# Patient Record
Sex: Female | Born: 1965 | Race: White | Hispanic: No | Marital: Married | Smoking: Former smoker
Health system: Southern US, Community
[De-identification: ages and names within clinical notes are randomized; demographics above are authoritative.]

## PROBLEM LIST (undated history)

## (undated) ENCOUNTER — Inpatient Hospital Stay: Admission: EM | Payer: Self-pay | Source: Home / Self Care

## (undated) DIAGNOSIS — J449 Chronic obstructive pulmonary disease, unspecified: Secondary | ICD-10-CM

## (undated) DIAGNOSIS — D649 Anemia, unspecified: Secondary | ICD-10-CM

## (undated) DIAGNOSIS — E785 Hyperlipidemia, unspecified: Secondary | ICD-10-CM

## (undated) DIAGNOSIS — C801 Malignant (primary) neoplasm, unspecified: Secondary | ICD-10-CM

## (undated) DIAGNOSIS — J45909 Unspecified asthma, uncomplicated: Secondary | ICD-10-CM

## (undated) DIAGNOSIS — R519 Headache, unspecified: Secondary | ICD-10-CM

## (undated) DIAGNOSIS — C539 Malignant neoplasm of cervix uteri, unspecified: Secondary | ICD-10-CM

## (undated) DIAGNOSIS — J40 Bronchitis, not specified as acute or chronic: Secondary | ICD-10-CM

## (undated) DIAGNOSIS — C4491 Basal cell carcinoma of skin, unspecified: Secondary | ICD-10-CM

## (undated) HISTORY — PX: APPENDECTOMY: SHX54

## (undated) HISTORY — PX: BREAST BIOPSY: SHX20

## (undated) HISTORY — PX: FOOT SURGERY: SHX648

## (undated) HISTORY — PX: TUBAL LIGATION: SHX77

## (undated) HISTORY — DX: Hyperlipidemia, unspecified: E78.5

## (undated) HISTORY — PX: MELANOMA EXCISION: SHX5266

## (undated) HISTORY — DX: Basal cell carcinoma of skin, unspecified: C44.91

## (undated) HISTORY — DX: Malignant neoplasm of cervix uteri, unspecified: C53.9

## (undated) HISTORY — PX: PARTIAL HYSTERECTOMY: SHX80

---

## 2015-11-03 DIAGNOSIS — F17200 Nicotine dependence, unspecified, uncomplicated: Secondary | ICD-10-CM | POA: Insufficient documentation

## 2015-11-03 DIAGNOSIS — F411 Generalized anxiety disorder: Secondary | ICD-10-CM | POA: Insufficient documentation

## 2015-11-03 DIAGNOSIS — F339 Major depressive disorder, recurrent, unspecified: Secondary | ICD-10-CM | POA: Insufficient documentation

## 2015-11-03 DIAGNOSIS — G43909 Migraine, unspecified, not intractable, without status migrainosus: Secondary | ICD-10-CM | POA: Insufficient documentation

## 2015-11-03 DIAGNOSIS — N951 Menopausal and female climacteric states: Secondary | ICD-10-CM | POA: Insufficient documentation

## 2015-11-03 DIAGNOSIS — M1990 Unspecified osteoarthritis, unspecified site: Secondary | ICD-10-CM | POA: Insufficient documentation

## 2015-11-05 DIAGNOSIS — Z79899 Other long term (current) drug therapy: Secondary | ICD-10-CM | POA: Insufficient documentation

## 2021-06-21 HISTORY — PX: FOOT SURGERY: SHX648

## 2021-08-02 ENCOUNTER — Ambulatory Visit: Payer: BC Managed Care – PPO | Admitting: Emergency Medicine

## 2021-08-02 ENCOUNTER — Encounter: Payer: Self-pay | Admitting: Emergency Medicine

## 2021-08-02 ENCOUNTER — Other Ambulatory Visit: Payer: Self-pay

## 2021-08-02 VITALS — BP 132/78 | HR 77 | Temp 98.4°F | Ht 60.0 in | Wt 112.0 lb

## 2021-08-02 DIAGNOSIS — R918 Other nonspecific abnormal finding of lung field: Secondary | ICD-10-CM | POA: Diagnosis not present

## 2021-08-02 DIAGNOSIS — J449 Chronic obstructive pulmonary disease, unspecified: Secondary | ICD-10-CM | POA: Diagnosis not present

## 2021-08-02 DIAGNOSIS — J441 Chronic obstructive pulmonary disease with (acute) exacerbation: Secondary | ICD-10-CM | POA: Insufficient documentation

## 2021-08-02 NOTE — Assessment & Plan Note (Signed)
Would benefit from PFT to quantify degree of obstruction, probably addition of a LAMA going forward.  She is currently on Advair and we will continue this for now. ?

## 2021-08-02 NOTE — Progress Notes (Signed)
? ?Subjective:  ? ? Patient ID: April Walsh, female    DOB: 1966-01-24, 56 y.o.   MRN: [id number] ? ?HPI ?56 year old smoker, 32 pack years, with a history of COPD/asthma, cervical cancer, basal cell skin cancer, migraines, depression. ?She carries a history of COPD/asthma and was having flaring symptoms, cough, bronchitic complaints, prompted chest imaging.  Her Symbicort was changed to Advair HFA.  She is here to discuss abnormal chest x-ray and CT chest. ? ?CT chest 07/20/2021 done at [facility] and reviewed by me shows a 3.1 x 2.2 cm right hilar mass with associated near complete opacification of the right upper lobe bronchus, 2 cm subcarinal node, 9 x 8 mm posterior right upper lobe nodular density.  Also noted were large bilateral adrenal masses concerning for metastatic disease.  There is a 1.5 cm rounded right breast density as well.  ? ? ?Review of Systems ?As per HPI ? ?PMH: ?COPD/asthma ?Cervical cancer ?Basal cell skin cancer ?Migraines ?Depression ? ?Surgical history: ?Appendectomy ?Cesarean x2 ?Partial hysterectomy tubal ligation ?Breast biopsy ? ?No family history on file.  ? ?Social History  ? ?Socioeconomic History  ? Marital status: Married  ?  Spouse name: Not on file  ? Number of children: Not on file  ? Years of education: Not on file  ? Highest education level: Not on file  ?Occupational History  ? Not on file  ?Tobacco Use  ? Smoking status: Every Day  ?  Packs/day: 1.00  ?  Years: 32.00  ?  Pack years: 32.00  ?  Types: Cigarettes  ?  Passive exposure: Past  ? Smokeless tobacco: Never  ? Tobacco comments:  ?  1/2 pack smoked daily ARJ, RN 08/02/21  ?Substance and Sexual Activity  ? Alcohol use: Not on file  ? Drug use: Not on file  ? Sexual activity: Not on file  ?Other Topics Concern  ? Not on file  ?Social History Narrative  ? Not on file  ? ?Social Determinants of Health  ? ?Financial Resource Strain: Not on file  ?Food Insecurity: Not on file  ?Transportation Needs: Not on file   ?Physical Activity: Not on file  ?Stress: Not on file  ?Social Connections: Not on file  ?Intimate Partner Violence: Not on file  ?  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  ? ?Not on File  ? ?Outpatient Medications Prior to Visit  ?Medication Sig Dispense Refill  ? albuterol (VENTOLIN HFA) 108 (90 Base) MCG/ACT inhaler Inhale into the lungs.    ? ?No facility-administered medications prior to visit.  ? ? ? ?   ?Objective:  ? Physical Exam ?Vitals:  ? 08/02/21 0941  ?BP: 132/78  ?Pulse: 77  ?Temp: 98.4 ?F (36.9 ?C)  ?TempSrc: Oral  ?SpO2: 98%  ?Weight: 112 lb (50.8 kg)  ?Height: 5' (1.524 m)  ? ?Gen: Pleasant, well-nourished, in no distress,  normal affect ? ?ENT: No lesions,  mouth clear,  oropharynx clear, no postnasal drip ? ?Neck: No JVD, no stridor ? ?Lungs: No use of accessory muscles, no crackles or wheezing on normal respiration, no wheeze on forced expiration ? ?Cardiovascular: RRR,  syst M w intact S2, no peripheral edema ? ?Musculoskeletal: No deformities, no cyanosis or clubbing ? ?Neuro: alert, awake, non focal ? ?Skin: Warm, no lesions or rash ? ? ?   ?Assessment & Plan:  ?Mass of upper lobe of right lung ?Newly discovered right hilar mass, satellite lesion in the right  upper lobe.  Also with bilateral adrenal lesions, suspect metastatic disease.  Considered possibly consulting interventional radiology for adrenal biopsies which would potentially clarify overall picture if evidence for metastatic lung cancer.  I think most straightforward approach is bronchoscopy.  She may ultimately require adrenal biopsies as well.  She has a breast lesion that I believe has been worked up in the past, deemed to be fibrocystic disease but may require repeat evaluation, ultrasound, biopsy. ? ?We will arrange for bronchoscopy to further evaluate your right lung.  This will be done as an outpatient under general anesthesia at [facility] endoscopy.  You will need a designated  driver.  Do not take aspirin or any nonsteroidal pain medication 2 days prior.  We will try to get this scheduled for 08/13/2021. ?We will need to work on decreasing your cigarettes.  Ultimately goal will be to stop altogether. ?Follow with Dr Lamonte Sakai in 1 month ? ?COPD (chronic obstructive pulmonary disease) ([place]) ?Would benefit from PFT to quantify degree of obstruction, probably addition of a LAMA going forward.  She is currently on Advair and we will continue this for now. ? ? ? ?Baltazar Apo, MD, PhD ?08/02/2021, 1:48 PM ?[facility] ?[phone] or if no answer before 7:00PM call [phone] ?For any issues after 7:00PM please call eLink [phone] ? ? ?

## 2021-08-02 NOTE — Patient Instructions (Signed)
We will arrange for bronchoscopy to further evaluate your right lung.  This will be done as an outpatient under general anesthesia at [facility] endoscopy.  You will need a designated driver.  Do not take aspirin or any nonsteroidal pain medication 2 days prior.  We will try to get this scheduled for 08/13/2021. ?Continue your Advair as you have been taking it ?Keep albuterol available to use 2 puffs when needed for shortness of breath, chest tightness, wheezing. ?We will need to work on decreasing your cigarettes.  Ultimately goal will be to stop altogether. ?Follow with Dr Lamonte Sakai in 1 month ? ?

## 2021-08-02 NOTE — H&P (View-Only) (Signed)
? ?Subjective:  ? ? Patient ID: April Walsh, female    DOB: 03/28/1966, 56 y.o.   MRN: [id number] ? ?HPI ?56 year old smoker, 32 pack years, with a history of COPD/asthma, cervical cancer, basal cell skin cancer, migraines, depression. ?She carries a history of COPD/asthma and was having flaring symptoms, cough, bronchitic complaints, prompted chest imaging.  Her Symbicort was changed to Advair HFA.  She is here to discuss abnormal chest x-ray and CT chest. ? ?CT chest 07/20/2021 done at [facility] and reviewed by me shows a 3.1 x 2.2 cm right hilar mass with associated near complete opacification of the right upper lobe bronchus, 2 cm subcarinal node, 9 x 8 mm posterior right upper lobe nodular density.  Also noted were large bilateral adrenal masses concerning for metastatic disease.  There is a 1.5 cm rounded right breast density as well.  ? ? ?Review of Systems ?As per HPI ? ?PMH: ?COPD/asthma ?Cervical cancer ?Basal cell skin cancer ?Migraines ?Depression ? ?Surgical history: ?Appendectomy ?Cesarean x2 ?Partial hysterectomy tubal ligation ?Breast biopsy ? ?No family history on file.  ? ?Social History  ? ?Socioeconomic History  ? Marital status: Married  ?  Spouse name: Not on file  ? Number of children: Not on file  ? Years of education: Not on file  ? Highest education level: Not on file  ?Occupational History  ? Not on file  ?Tobacco Use  ? Smoking status: Every Day  ?  Packs/day: 1.00  ?  Years: 32.00  ?  Pack years: 32.00  ?  Types: Cigarettes  ?  Passive exposure: Past  ? Smokeless tobacco: Never  ? Tobacco comments:  ?  1/2 pack smoked daily ARJ, RN 08/02/21  ?Substance and Sexual Activity  ? Alcohol use: Not on file  ? Drug use: Not on file  ? Sexual activity: Not on file  ?Other Topics Concern  ? Not on file  ?Social History Narrative  ? Not on file  ? ?Social Determinants of Health  ? ?Financial Resource Strain: Not on file  ?Food Insecurity: Not on file  ?Transportation Needs: Not on file   ?Physical Activity: Not on file  ?Stress: Not on file  ?Social Connections: Not on file  ?Intimate Partner Violence: Not on file  ?  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  ? ?Not on File  ? ?Outpatient Medications Prior to Visit  ?Medication Sig Dispense Refill  ? albuterol (VENTOLIN HFA) 108 (90 Base) MCG/ACT inhaler Inhale into the lungs.    ? ?No facility-administered medications prior to visit.  ? ? ? ?   ?Objective:  ? Physical Exam ?Vitals:  ? 08/02/21 0941  ?BP: 132/78  ?Pulse: 77  ?Temp: 98.4 ?F (36.9 ?C)  ?TempSrc: Oral  ?SpO2: 98%  ?Weight: 112 lb (50.8 kg)  ?Height: 5' (1.524 m)  ? ?Gen: Pleasant, well-nourished, in no distress,  normal affect ? ?ENT: No lesions,  mouth clear,  oropharynx clear, no postnasal drip ? ?Neck: No JVD, no stridor ? ?Lungs: No use of accessory muscles, no crackles or wheezing on normal respiration, no wheeze on forced expiration ? ?Cardiovascular: RRR,  syst M w intact S2, no peripheral edema ? ?Musculoskeletal: No deformities, no cyanosis or clubbing ? ?Neuro: alert, awake, non focal ? ?Skin: Warm, no lesions or rash ? ? ?   ?Assessment & Plan:  ?Mass of upper lobe of right lung ?Newly discovered right hilar mass, satellite lesion in the right  upper lobe.  Also with bilateral adrenal lesions, suspect metastatic disease.  Considered possibly consulting interventional radiology for adrenal biopsies which would potentially clarify overall picture if evidence for metastatic lung cancer.  I think most straightforward approach is bronchoscopy.  She may ultimately require adrenal biopsies as well.  She has a breast lesion that I believe has been worked up in the past, deemed to be fibrocystic disease but may require repeat evaluation, ultrasound, biopsy. ? ?We will arrange for bronchoscopy to further evaluate your right lung.  This will be done as an outpatient under general anesthesia at [facility] endoscopy.  You will need a designated  driver.  Do not take aspirin or any nonsteroidal pain medication 2 days prior.  We will try to get this scheduled for 08/13/2021. ?We will need to work on decreasing your cigarettes.  Ultimately goal will be to stop altogether. ?Follow with Dr Lamonte Sakai in 1 month ? ?COPD (chronic obstructive pulmonary disease) ([place]) ?Would benefit from PFT to quantify degree of obstruction, probably addition of a LAMA going forward.  She is currently on Advair and we will continue this for now. ? ? ? ?Baltazar Apo, MD, PhD ?08/02/2021, 1:48 PM ?[facility] ?[phone] or if no answer before 7:00PM call [phone] ?For any issues after 7:00PM please call eLink [phone] ? ? ?

## 2021-08-02 NOTE — Assessment & Plan Note (Addendum)
Newly discovered right hilar mass, satellite lesion in the right upper lobe.  Also with bilateral adrenal lesions, suspect metastatic disease.  Considered possibly consulting interventional radiology for adrenal biopsies which would potentially clarify overall picture if evidence for metastatic lung cancer.  I think most straightforward approach is bronchoscopy.  She may ultimately require adrenal biopsies as well.  She has a breast lesion that I believe has been worked up in the past, deemed to be fibrocystic disease but may require repeat evaluation, ultrasound, biopsy. ? ?We will arrange for bronchoscopy to further evaluate your right lung.  This will be done as an outpatient under general anesthesia at [facility] endoscopy.  You will need a designated driver.  Do not take aspirin or any nonsteroidal pain medication 2 days prior.  We will try to get this scheduled for 08/13/2021. ?We will need to work on decreasing your cigarettes.  Ultimately goal will be to stop altogether. ?Follow with Dr Lamonte Sakai in 1 month ?

## 2021-08-08 ENCOUNTER — Telehealth: Payer: Self-pay

## 2021-08-08 NOTE — Telephone Encounter (Signed)
CD with CT Chest (07/20/21) and CXR (07/13/21) received and placed in Dr. Agustina Caroli review folder  ?

## 2021-08-09 ENCOUNTER — Other Ambulatory Visit: Payer: Self-pay

## 2021-08-09 ENCOUNTER — Encounter (HOSPITAL_COMMUNITY): Payer: Self-pay | Admitting: Emergency Medicine

## 2021-08-09 NOTE — Pre-Procedure Instructions (Signed)
SDW CALL ? ?Patient was given pre-op instructions over the phone. The opportunity was given for the patient to ask questions. No further questions asked. Patient verbalized understanding of instructions given. ? ? ?PCP - [name], FNP ?Cardiologist - denies ? ?PPM/ICD - denies ?Chest x-ray - 07/13/21 in care everywhere ?EKG - denies ?Stress Test - denies ?ECHO - denies ?Cardiac Cath - denies ? ?Aspirin Instructions:not taking aspiring ? ? ?COVID TEST- pt states she is going for test 08/10/21 per surgeon ? ? ?Anesthesia review: no ? ?Patient denies shortness of breath, fever, cough and chest pain over the phone call ? ? ? ?Surgical Instructions ? ? ? Your procedure is scheduled on Monday, April 3 ? [facility]tr[facility]A" at 5:30 A.M., then check in with the Admitting office. ? Call this number if you have problems the morning of surgery: ? [phone] ? ? ? Remember: ? Do not eat or drink after midnight the night before your surgery ? ? ? Take these medicines the morning of surgery with A SIP OF WATER:  ? ?ADVAIR HFA 115-21 MCG/ACT inhaler ?Please bring all inhalers with you the day of surgery.  ? ? ?As of today, STOP taking any Aspirin (unless otherwise instructed by your surgeon) Aleve, Naproxen, Ibuprofen, Motrin, Advil, Goody's, BC's, all herbal medications, fish oil, and all vitamins. ? ?[facility] is not responsible for any belongings or valuables.  ? ?Do NOT Smoke (Tobacco/Vaping)  24 hours prior to your procedure ? ?Contacts, glasses, hearing aids, dentures or partials may not be worn into surgery, please bring cases for these belongings ?  ?Patients discharged the day of surgery will not be allowed to drive home, and someone needs to stay with them for 24 hours. ? ? ?SURGICAL WAITING ROOM VISITATION ?No visitors are allowed in pre-op area with patient.  ?Patients having surgery or a procedure in a hospital may have two support people in the waiting room. ?Children under the age of  11 must have an adult with them who is not the patient. ?They may stay in the waiting area during the procedure and may switch out with other visitors. If the patient needs to stay at the hospital during part of their recovery, the visitor guidelines for inpatient rooms apply. ? ?Please refer to the [facility] website for the visitor guidelines for Inpatients (after your surgery is over and you are in a regular room).  ? ? ? ?Special instructions:   ? ?Oral Hygiene is also important to reduce your risk of infection.  Remember - BRUSH YOUR TEETH THE MORNING OF SURGERY WITH YOUR REGULAR TOOTHPASTE ? ? ?Day of Surgery: ? ?Take a shower the day of or night before with antibacterial soap. ?Wear Clean/Comfortable clothing the morning of surgery ?Do not apply any deodorants/lotions.   ?Do not wear jewelry or makeup ?Do not wear lotions, powders, perfumes/colognes, or deodorant. ?Do not shave 48 hours prior to surgery.  Men may shave face and neck. ?Do not bring valuables to the hospital. ?Do not wear nail polish, gel polish, artificial nails, or any other type of covering on natural nails (fingers and toes) ?If you have artificial nails or gel coating that need to be removed by a nail salon, please have this removed prior to surgery. Artificial nails or gel coating may interfere with anesthesia's ability to adequately monitor your vital signs. ?Remember to brush your teeth WITH YOUR REGULAR TOOTHPASTE. ? ? ? ? ? ?

## 2021-08-10 ENCOUNTER — Other Ambulatory Visit: Payer: Self-pay | Admitting: Emergency Medicine

## 2021-08-10 LAB — SARS CORONAVIRUS 2 (TAT 6-24 HRS): SARS Coronavirus 2: NEGATIVE

## 2021-08-13 ENCOUNTER — Other Ambulatory Visit: Payer: Self-pay | Admitting: Emergency Medicine

## 2021-08-13 ENCOUNTER — Encounter (HOSPITAL_COMMUNITY)
Admission: RE | Disposition: A | Discharge status: Home / Self Care | Payer: Self-pay | Source: Ambulatory Visit | Attending: Emergency Medicine

## 2021-08-13 ENCOUNTER — Ambulatory Visit (HOSPITAL_COMMUNITY): Payer: BC Managed Care – PPO | Admitting: Certified Registered"

## 2021-08-13 ENCOUNTER — Other Ambulatory Visit: Payer: Self-pay

## 2021-08-13 ENCOUNTER — Ambulatory Visit (HOSPITAL_COMMUNITY): Payer: BC Managed Care – PPO

## 2021-08-13 ENCOUNTER — Encounter (HOSPITAL_COMMUNITY): Payer: Self-pay | Admitting: Emergency Medicine

## 2021-08-13 ENCOUNTER — Ambulatory Visit (HOSPITAL_COMMUNITY)
Admission: RE | Admit: 2021-08-13 | Discharge: 2021-08-13 | Disposition: A | Discharge status: Home / Self Care | Payer: BC Managed Care – PPO | Source: Ambulatory Visit | Attending: Emergency Medicine | Admitting: Emergency Medicine

## 2021-08-13 DIAGNOSIS — C3490 Malignant neoplasm of unspecified part of unspecified bronchus or lung: Secondary | ICD-10-CM

## 2021-08-13 DIAGNOSIS — Z85828 Personal history of other malignant neoplasm of skin: Secondary | ICD-10-CM | POA: Insufficient documentation

## 2021-08-13 DIAGNOSIS — F32A Depression, unspecified: Secondary | ICD-10-CM | POA: Diagnosis not present

## 2021-08-13 DIAGNOSIS — R846 Abnormal cytological findings in specimens from respiratory organs and thorax: Secondary | ICD-10-CM | POA: Insufficient documentation

## 2021-08-13 DIAGNOSIS — R918 Other nonspecific abnormal finding of lung field: Secondary | ICD-10-CM | POA: Diagnosis present

## 2021-08-13 DIAGNOSIS — J449 Chronic obstructive pulmonary disease, unspecified: Secondary | ICD-10-CM | POA: Insufficient documentation

## 2021-08-13 DIAGNOSIS — F17210 Nicotine dependence, cigarettes, uncomplicated: Secondary | ICD-10-CM | POA: Diagnosis not present

## 2021-08-13 DIAGNOSIS — Z8541 Personal history of malignant neoplasm of cervix uteri: Secondary | ICD-10-CM | POA: Diagnosis not present

## 2021-08-13 DIAGNOSIS — R590 Localized enlarged lymph nodes: Secondary | ICD-10-CM | POA: Insufficient documentation

## 2021-08-13 HISTORY — PX: HEMOSTASIS CONTROL: SHX6838

## 2021-08-13 HISTORY — DX: Chronic obstructive pulmonary disease, unspecified: J44.9

## 2021-08-13 HISTORY — DX: Anemia, unspecified: D64.9

## 2021-08-13 HISTORY — DX: Headache, unspecified: R51.9

## 2021-08-13 HISTORY — DX: Malignant (primary) neoplasm, unspecified: C80.1

## 2021-08-13 HISTORY — PX: VIDEO BRONCHOSCOPY: SHX5072

## 2021-08-13 HISTORY — PX: BRONCHIAL BIOPSY: SHX5109

## 2021-08-13 HISTORY — PX: BRONCHIAL BRUSHINGS: SHX5108

## 2021-08-13 HISTORY — DX: Unspecified asthma, uncomplicated: J45.909

## 2021-08-13 HISTORY — PX: VIDEO BRONCHOSCOPY WITH ENDOBRONCHIAL ULTRASOUND: SHX6177

## 2021-08-13 HISTORY — DX: Bronchitis, not specified as acute or chronic: J40

## 2021-08-13 HISTORY — DX: Malignant neoplasm of unspecified part of unspecified bronchus or lung: C34.90

## 2021-08-13 HISTORY — PX: BRONCHIAL NEEDLE ASPIRATION BIOPSY: SHX5106

## 2021-08-13 LAB — CBC
HCT: 32.1 % — ABNORMAL LOW (ref 36.0–46.0)
Hemoglobin: 10.4 g/dL — ABNORMAL LOW (ref 12.0–15.0)
MCH: 31.5 pg (ref 26.0–34.0)
MCHC: 32.4 g/dL (ref 30.0–36.0)
MCV: 97.3 fL (ref 80.0–100.0)
Platelets: 255 10*3/uL (ref 150–400)
RBC: 3.3 MIL/uL — ABNORMAL LOW (ref 3.87–5.11)
RDW: 15.6 % — ABNORMAL HIGH (ref 11.5–15.5)
WBC: 7.4 10*3/uL (ref 4.0–10.5)
nRBC: 0 % (ref 0.0–0.2)

## 2021-08-13 SURGERY — BRONCHOSCOPY, WITH EBUS
Anesthesia: General

## 2021-08-13 MED ORDER — SODIUM CHLORIDE (PF) 0.9 % IJ SOLN
PREFILLED_SYRINGE | INTRAMUSCULAR | Status: DC | PRN
  Administered 2021-08-13 (×3): 2 mL

## 2021-08-13 MED ORDER — PROPOFOL 10 MG/ML IV BOLUS
INTRAVENOUS | Status: DC | PRN
  Administered 2021-08-13: 120 mg via INTRAVENOUS

## 2021-08-13 MED ORDER — AMISULPRIDE (ANTIEMETIC) 5 MG/2ML IV SOLN: 10.0000 mg | Freq: Once | INTRAVENOUS | Status: DC | PRN

## 2021-08-13 MED ORDER — DEXAMETHASONE SODIUM PHOSPHATE 10 MG/ML IJ SOLN
INTRAMUSCULAR | Status: DC | PRN
  Administered 2021-08-13: 10 mg via INTRAVENOUS

## 2021-08-13 MED ORDER — PROMETHAZINE HCL 25 MG/ML IJ SOLN: 6.2500 mg | INTRAMUSCULAR | Status: DC | PRN

## 2021-08-13 MED ORDER — MEPERIDINE HCL 25 MG/ML IJ SOLN: 6.2500 mg | INTRAMUSCULAR | Status: DC | PRN

## 2021-08-13 MED ORDER — HYDROMORPHONE HCL 1 MG/ML IJ SOLN: 0.2500 mg | INTRAMUSCULAR | Status: DC | PRN

## 2021-08-13 MED ORDER — CHLORHEXIDINE GLUCONATE 0.12 % MT SOLN
OROMUCOSAL | Status: AC
  Filled 2021-08-13: qty 15

## 2021-08-13 MED ORDER — OXYCODONE HCL 5 MG PO TABS: 5.0000 mg | ORAL_TABLET | Freq: Once | ORAL | Status: DC | PRN

## 2021-08-13 MED ORDER — LACTATED RINGERS IV SOLN: INTRAVENOUS | Status: DC | PRN

## 2021-08-13 MED ORDER — ROCURONIUM BROMIDE 10 MG/ML (PF) SYRINGE
PREFILLED_SYRINGE | INTRAVENOUS | Status: DC | PRN
  Administered 2021-08-13: 50 mg via INTRAVENOUS

## 2021-08-13 MED ORDER — EPINEPHRINE 1 MG/10ML IJ SOSY
PREFILLED_SYRINGE | INTRAMUSCULAR | Status: AC
  Filled 2021-08-13: qty 10

## 2021-08-13 MED ORDER — LIDOCAINE 2% (20 MG/ML) 5 ML SYRINGE
INTRAMUSCULAR | Status: DC | PRN
  Administered 2021-08-13: 50 mg via INTRAVENOUS

## 2021-08-13 MED ORDER — MIDAZOLAM HCL 2 MG/2ML IJ SOLN
INTRAMUSCULAR | Status: DC | PRN
  Administered 2021-08-13: 2 mg via INTRAVENOUS

## 2021-08-13 MED ORDER — FENTANYL CITRATE (PF) 100 MCG/2ML IJ SOLN
INTRAMUSCULAR | Status: DC | PRN
  Administered 2021-08-13: 100 ug via INTRAVENOUS

## 2021-08-13 MED ORDER — OXYCODONE HCL 5 MG/5ML PO SOLN: 5.0000 mg | Freq: Once | ORAL | Status: DC | PRN

## 2021-08-13 MED ORDER — ONDANSETRON HCL 4 MG/2ML IJ SOLN
INTRAMUSCULAR | Status: DC | PRN
  Administered 2021-08-13: 4 mg via INTRAVENOUS

## 2021-08-13 MED ORDER — SUGAMMADEX SODIUM 200 MG/2ML IV SOLN
INTRAVENOUS | Status: DC | PRN
  Administered 2021-08-13: 200 mg via INTRAVENOUS

## 2021-08-13 NOTE — Progress Notes (Signed)
Referral [place] ?

## 2021-08-13 NOTE — Interval H&P Note (Signed)
History and Physical Interval Note: ? ?08/13/2021 ?7:25 AM ? ?Lonna Rabold  has presented today for surgery, with the diagnosis of lung mass.  The various methods of treatment have been discussed with the patient and family. After consideration of risks, benefits and other options for treatment, the patient has consented to  Procedure(s): ?VIDEO BRONCHOSCOPY WITH ENDOBRONCHIAL ULTRASOUND (N/A) ?ROBOTIC ASSISTED NAVIGATIONAL BRONCHOSCOPY (N/A) as a surgical intervention.  The patient's history has been reviewed, patient examined, no change in status, stable for surgery.  I have reviewed the patient's chart and labs.  Questions were answered to the patient's satisfaction.   ? ? ?Rose Fillers Yara Tomkinson ? ? ?

## 2021-08-13 NOTE — Anesthesia Postprocedure Evaluation (Signed)
Anesthesia Post Note ? ?Patient: April Walsh ? ?Procedure(s) Performed: VIDEO BRONCHOSCOPY WITH ENDOBRONCHIAL ULTRASOUND ?VIDEO BRONCHOSCOPY WITHOUT FLUORO ?BRONCHIAL BIOPSIES ?HEMOSTASIS CONTROL ?BRONCHIAL BRUSHINGS ?BRONCHIAL NEEDLE ASPIRATION BIOPSIES ? ?  ? ?Patient location during evaluation: PACU ?Anesthesia Type: General ?Level of consciousness: awake and alert ?Pain management: pain level controlled ?Vital Signs Assessment: post-procedure vital signs reviewed and stable ?Respiratory status: spontaneous breathing, nonlabored ventilation and respiratory function stable ?Cardiovascular status: blood pressure returned to baseline and stable ?Postop Assessment: no apparent nausea or vomiting ?Anesthetic complications: no ? ? ?No notable events documented. ? ?Last Vitals:  ?Vitals:  ? 08/13/21 0905 08/13/21 0920  ?BP: 124/79   ?Pulse: 98   ?Resp: 20   ?Temp:  37.2 ?C  ?SpO2: 97%   ?  ?Last Pain:  ?Vitals:  ? 08/13/21 0920  ?TempSrc:   ?PainSc: 0-No pain  ? ? ?  ?  ?  ?  ?  ?  ? ?Lynda Rainwater ? ? ? ? ?

## 2021-08-13 NOTE — Transfer of Care (Signed)
Immediate Anesthesia Transfer of Care Note ? ?Patient: April Walsh ? ?Procedure(s) Performed: VIDEO BRONCHOSCOPY WITH ENDOBRONCHIAL ULTRASOUND ?VIDEO BRONCHOSCOPY WITHOUT FLUORO ?BRONCHIAL BIOPSIES ?HEMOSTASIS CONTROL ?BRONCHIAL BRUSHINGS ?BRONCHIAL NEEDLE ASPIRATION BIOPSIES ? ?Patient Location: PACU ? ?Anesthesia Type:General ? ?Level of Consciousness: drowsy and patient cooperative ? ?Airway & Oxygen Therapy: Patient Spontanous Breathing and Patient connected to nasal cannula oxygen ? ?Post-op Assessment: Report given to RN, Post -op Vital signs reviewed and stable and Patient moving all extremities ? ?Post vital signs: Reviewed and stable ? ?Last Vitals:  ?Vitals Value Taken Time  ?BP    ?Temp    ?Pulse 115 08/13/21 0852  ?Resp 16 08/13/21 0852  ?SpO2 97 % 08/13/21 0852  ?Vitals shown include unvalidated device data. ? ?Last Pain:  ?Vitals:  ? 08/13/21 0634  ?TempSrc:   ?PainSc: 0-No pain  ?   ? ?  ? ?Complications: No notable events documented. ?

## 2021-08-13 NOTE — Op Note (Signed)
Video Bronchoscopy with Endobronchial Ultrasound Procedure Note ? ?Date of Operation: 08/13/2021 ? ?Pre-op Diagnosis: Right upper lobe mass, mediastinal adenopathy ? ?Post-op Diagnosis: Same ? ?Surgeon: Baltazar Apo ? ?Assistants: None ? ?Anesthesia: General endotracheal anesthesia ? ?Operation: Flexible video fiberoptic bronchoscopy with endobronchial ultrasound and biopsies. ? ?Estimated Blood Loss: Minimal ? ?Complications: None apparent ? ?Indications and History: ?April Walsh is a 56 y.o. female with history of tobacco use.  She is found to have a right hilar mass on chest x-ray, confirmed on CT scan of the chest.  Associated mediastinal adenopathy.  Recommendation made to achieve a tissue diagnosis via bronchoscopy with endobronchial ultrasound.  The risks, benefits, complications, treatment options and expected outcomes were discussed with the patient.  The possibilities of pneumothorax, pneumonia, reaction to medication, pulmonary aspiration, perforation of a viscus, bleeding, failure to diagnose a condition and creating a complication requiring transfusion or operation were discussed with the patient who freely signed the consent.   ? ?Description of Procedure: ?The patient was examined in the preoperative area and history and data from the preprocedure consultation were reviewed. It was deemed appropriate to proceed.  The patient was taken to [facility] endoscopy room 3, identified as Mallorey Odonell and the procedure verified as Flexible Video Fiberoptic Bronchoscopy.  A Time Out was held and the above information confirmed. After being taken to the operating room general anesthesia was initiated and the patient  was orally intubated. The video fiberoptic bronchoscope was introduced via the endotracheal tube and a general inspection was performed which showed normal airways on the left.  There was significant distortion and endobronchial mucosal abnormality in the right upper lobe airway with occlusion of  all segmental bronchi.  The endobronchial abnormality extended into the bronchus intermedius.  The scope would pass through the [facility] and identified the right middle lobe and right lower lobe airways which appeared to be normal without involvement.  Endobronchial brushings and biopsies were performed in the right upper lobe airway and bronchus intermedius to be sent for cytology and pathology.  The standard scope was then withdrawn and the endobronchial ultrasound was used to identify and characterize the peritracheal, hilar and bronchial lymph nodes. Inspection showed significant enlargement of station 7 node.  The right upper lobe mass could be identified in the regions of station 4R, station 10 R. Using real-time ultrasound guidance Wang needle biopsies were take from Station 7 node and were sent for cytology. The patient tolerated the procedure well without apparent complications. There was no significant blood loss. The bronchoscope was withdrawn. Anesthesia was reversed and the patient was taken to the PACU for recovery.  ? ?Samples: ?1. Wang needle biopsies from 7 node ?2.  Endobronchial brushings from right upper lobe airway ?3.  Endobronchial forceps biopsies from right upper lobe airway ?4.  Endobronchial brushings from bronchus intermedius ?5.  Endobronchial forceps biopsies from bronchus intermedius ? ?Plans:  ?The patient will be discharged from the PACU to home when recovered from anesthesia. We will review the cytology, pathology and microbiology results with the patient when they become available. Outpatient followup will be with Dr. Lamonte Sakai.  ? ? ?Baltazar Apo, MD, PhD ?08/13/2021, 8:50 AM ?[facility] ?[phone] or if no answer before 7:00PM call [phone] ?For any issues after 7:00PM please call eLink [phone] ? ?

## 2021-08-13 NOTE — Anesthesia Preprocedure Evaluation (Signed)
Anesthesia Evaluation  ?Patient identified by MRN, date of birth, ID band ?Patient awake ? ? ? ?Reviewed: ?Allergy & Precautions, NPO status , Patient's Chart, lab work & pertinent test results ? ?Airway ?Mallampati: II ? ?TM Distance: >3 FB ?Neck ROM: Full ? ? ? Dental ?no notable dental hx. ? ?  ?Pulmonary ?asthma , COPD, Current Smoker and Patient abstained from smoking.,  ?  ?Pulmonary exam normal ?breath sounds clear to auscultation ? ? ? ? ? ? Cardiovascular ?negative cardio ROS ?Normal cardiovascular exam ?Rhythm:Regular Rate:Normal ? ? ?  ?Neuro/Psych ? Headaches, negative psych ROS  ? GI/Hepatic ?negative GI ROS, Neg liver ROS,   ?Endo/Other  ?negative endocrine ROS ? Renal/GU ?negative Renal ROS  ?negative genitourinary ?  ?Musculoskeletal ?negative musculoskeletal ROS ?(+)  ? Abdominal ?  ?Peds ?negative pediatric ROS ?(+)  Hematology ?negative hematology ROS ?(+)   ?Anesthesia Other Findings ? ? Reproductive/Obstetrics ?negative OB ROS ? ?  ? ? ? ? ? ? ? ? ? ? ? ? ? ?  ?  ? ? ? ? ? ? ? ? ?Anesthesia Physical ?Anesthesia Plan ? ?ASA: 3 ? ?Anesthesia Plan: General  ? ?Post-op Pain Management:   ? ?Induction: Intravenous ? ?PONV Risk Score and Plan: 2 and Ondansetron, Midazolam and Treatment may vary due to age or medical condition ? ?Airway Management Planned: Oral ETT ? ?Additional Equipment:  ? ?Intra-op Plan:  ? ?Post-operative Plan: Extubation in OR ? ?Informed Consent: I have reviewed the patients History and Physical, chart, labs and discussed the procedure including the risks, benefits and alternatives for the proposed anesthesia with the patient or authorized representative who has indicated his/her understanding and acceptance.  ? ? ? ?Dental advisory given ? ?Plan Discussed with: CRNA ? ?Anesthesia Plan Comments:   ? ? ? ? ? ? ?Anesthesia Quick Evaluation ? ?

## 2021-08-13 NOTE — Anesthesia Procedure Notes (Signed)
Procedure Name: Intubation ?Date/Time: 08/13/2021 7:41 AM ?Performed by: Moshe Salisbury, CRNA ?Pre-anesthesia Checklist: Patient identified, Emergency Drugs available, Suction available and Patient being monitored ?Patient Re-evaluated:Patient Re-evaluated prior to induction ?Oxygen Delivery Method: Circle System Utilized ?Preoxygenation: Pre-oxygenation with 100% oxygen ?Induction Type: IV induction ?Ventilation: Mask ventilation without difficulty ?Laryngoscope Size: Mac and 3 ?Grade View: Grade I ?Tube type: Oral ?Tube size: 8.5 mm ?Number of attempts: 1 ?Airway Equipment and Method: Stylet ?Placement Confirmation: ETT inserted through vocal cords under direct vision, positive ETCO2 and breath sounds checked- equal and bilateral ?Secured at: 20 cm ?Tube secured with: Tape ?Dental Injury: Teeth and Oropharynx as per pre-operative assessment  ? ? ? ? ?

## 2021-08-13 NOTE — Discharge Instructions (Signed)
Flexible Bronchoscopy, Care After ?This sheet gives you information about how to care for yourself after your test. Your doctor may also give you more specific instructions. If you have problems or questions, contact your doctor. ?Follow these instructions at home: ?Eating and drinking ?Do not eat or drink anything (not even water) for 2 hours after your test, or until your numbing medicine (local anesthetic) wears off. ?When your numbness is gone and your cough and gag reflexes have come back, you may: ?Eat only soft foods. ?Slowly drink liquids. ?The day after the test, go back to your normal diet. ?Driving ?Do not drive for 24 hours if you were given a medicine to help you relax (sedative). ?Do not drive or use heavy machinery while taking prescription pain medicine. ?General instructions ? ?Take over-the-counter and prescription medicines only as told by your doctor. ?Return to your normal activities as told. Ask what activities are safe for you. ?Do not use any products that have nicotine or tobacco in them. This includes cigarettes and e-cigarettes. If you need help quitting, ask your doctor. ?Keep all follow-up visits as told by your doctor. This is important. It is very important if you had a tissue sample (biopsy) taken. ?Get help right away if: ?You have shortness of breath that gets worse. ?You get light-headed. ?You feel like you are going to pass out (faint). ?You have chest pain. ?You cough up: ?More than a little blood. ?More blood than before. ?Summary ?Do not eat or drink anything (not even water) for 2 hours after your test, or until your numbing medicine wears off. ?Do not use cigarettes. Do not use e-cigarettes. ?Get help right away if you have chest pain. ? ?Please call our office for any questions or concerns.  [phone] ? ?This information is not intended to replace advice given to you by your health care provider. Make sure you discuss any questions you have with your health care  provider. ?Document Released: 02/24/2009 Document Revised: 04/11/2017 Document Reviewed: 05/17/2016 ?Elsevier Patient Education ? [place]. ? ?

## 2021-08-14 ENCOUNTER — Encounter (HOSPITAL_COMMUNITY): Payer: Self-pay | Admitting: Emergency Medicine

## 2021-08-15 ENCOUNTER — Encounter: Payer: Self-pay | Admitting: *Deleted

## 2021-08-15 ENCOUNTER — Ambulatory Visit
Admission: RE | Admit: 2021-08-15 | Discharge: 2021-08-15 | Disposition: A | Discharge status: Home / Self Care | Payer: Self-pay | Source: Ambulatory Visit | Attending: Radiation Oncology | Admitting: Radiation Oncology

## 2021-08-15 ENCOUNTER — Other Ambulatory Visit: Payer: Self-pay | Admitting: Radiation Oncology

## 2021-08-15 DIAGNOSIS — R918 Other nonspecific abnormal finding of lung field: Secondary | ICD-10-CM

## 2021-08-15 LAB — CYTOLOGY - NON PAP

## 2021-08-15 NOTE — Progress Notes (Signed)
Oncology Nurse Navigator Documentation ? ? ?  08/15/2021  ? 10:00 AM  ?Oncology Nurse Navigator Flowsheets  ?Abnormal Finding Date 07/20/2021  ?Confirmed Diagnosis Date 08/13/2021  ?Diagnosis Status Pathology Pending  ?Navigator Follow Up Date: 08/21/2021  ?Navigator Follow Up Reason: New Patient Appointment  ?Navigator Location [facility]  ?Referral Date to RadOnc/MedOnc 08/13/2021  ?Navigator Encounter Type Other:;Pathology Review;Scan Review/I received referral on April Walsh. I notified new patient coordinator to call and schedule her to be seen next week.   ?Treatment Phase Abnormal Scans  ?Barriers/Navigation Needs Coordination of Care  ?Interventions Coordination of Care  ?Acuity Level 3-Moderate Needs (3-4 Barriers Identified)  ?Time Spent with Patient 45  ?  ?

## 2021-08-15 NOTE — Progress Notes (Signed)
I called patient and scheduled her to be seen next week with Cassi and Dr. Julien Nordmann. She verbalized understanding of appt.  ?

## 2021-08-15 NOTE — Progress Notes (Signed)
Thoracic Location of Tumor / Histology: Right Hilar mass, satellite lesion in the Right Upper Lobe, bilateral adrenal lesions. ? ?Patient presented to her PCP with complaints of cough and wheezing.  She reports she has been coughing up tiny amounts of bright red blood.  In the setting of her COPD her PCP ordered a chest xray to rule out concurrent pneumonia. ? ?Bronchoscopy 08/13/2021: ? ?CT Chest 07/20/2021: 3.1 x 2.2 cm right hilar mass with associated near complete opacification of the right upper lobe bronchus, 2 cm subcarinal node, 9 x 8 mm posterior right upper lobe nodular density.  Large bilateral adrenal masses concerning for metastatic disease.  There is a 1.5 cm rounded right breast density as well. ? ?Chest Xray 07/13/2021: Right suprahilar mass-like opacity and 7 mm right upper lobe nodule.  Findings are suspicious for malignancy, recommend further evaluation with chest CT. ? ?Biopsies of RUL Lung 08/13/2021 ? ? ? ?Tobacco/Marijuana/Snuff/ETOH use: Current Smoker, Trying to quit. ? ? ?Past/Anticipated interventions by pulmonary, if any: ?Dr. Lamonte Sakai 08/02/2021 ?-Considered possibly consulting interventional radiology for adrenal biopsies which would potentially clarify overall picture if evidence for metastatic lung cancer. ?-  I think most straightforward approach is bronchoscopy.  She may ultimately require adrenal biopsies as well.  ?-We will arrange for bronchoscopy to further evaluate your right lung.  ? ? ?Past/Anticipated interventions by cardiothoracic surgery, if any:  ? ?Past/Anticipated interventions by medical oncology, if any:  ?Cassie PA/ Dr. Julien Nordmann 08/21/2021 ? ? ? ?Signs/Symptoms ?Weight changes, if any: She notes she has lost about 25 within the last year.  She reports she lost her daughter around the time of her weight loss. ?Respiratory complaints, if any: She reports SOB all the time, regardless of activity level. ?Hemoptysis, if any: She reports productive cough with mostly clear/yellow  mucus, she has occasional small amounts of blood. ?Pain issues, if any:  She reports some chest tightness and heaviness.  This seems to be partially relieved with the use of her inhalers. ? ?SAFETY ISSUES: ?Prior radiation? No ?Pacemaker/ICD?  No ?Possible current pregnancy? Hysterectomy ?Is the patient on methotrexate? No ? ?Current Complaints / other details:   ?

## 2021-08-15 NOTE — Progress Notes (Signed)
Oncology Nurse Navigator Documentation ? ? ?  08/15/2021  ? 10:00 AM  ?Oncology Nurse Navigator Flowsheets  ?Abnormal Finding Date 07/20/2021  ?Confirmed Diagnosis Date 08/13/2021  ?Diagnosis Status Pathology Pending  ?Navigator Follow Up Date: 08/21/2021  ?Navigator Follow Up Reason: New Patient Appointment  ?Navigator Location [facility]  ?Referral Date to RadOnc/MedOnc 08/13/2021  ?Navigator Encounter Type Other:;Pathology Review;Scan Review/I received a message from [place] to get PET and MRI brain scheduled.  I called radiology and was able to get them scheduled.  I called patient back with an update on all appts and pre-procedure instructions for PET scan.   ? 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 Julien Nordmann sent for molecular and PDL 1 to [facility].   ?Treatment Phase Abnormal Scans  ?Barriers/Navigation Needs Coordination of Care  ?Interventions Coordination of Care  ?Acuity Level 3-Moderate Needs (3-4 Barriers Identified)  ?Time Spent with Patient 45  ?  ?

## 2021-08-16 ENCOUNTER — Ambulatory Visit
Admission: RE | Admit: 2021-08-16 | Discharge: 2021-08-16 | Disposition: A | Discharge status: Home / Self Care | Payer: BC Managed Care – PPO | Source: Ambulatory Visit | Attending: Radiation Oncology | Admitting: Radiation Oncology

## 2021-08-16 ENCOUNTER — Encounter: Payer: Self-pay | Admitting: Radiation Oncology

## 2021-08-16 ENCOUNTER — Other Ambulatory Visit: Payer: Self-pay | Admitting: *Deleted

## 2021-08-16 ENCOUNTER — Other Ambulatory Visit: Payer: Self-pay

## 2021-08-16 VITALS — BP 146/86 | HR 85 | Resp 16 | Ht 60.0 in | Wt 107.8 lb

## 2021-08-16 DIAGNOSIS — F17210 Nicotine dependence, cigarettes, uncomplicated: Secondary | ICD-10-CM | POA: Diagnosis not present

## 2021-08-16 DIAGNOSIS — R0789 Other chest pain: Secondary | ICD-10-CM | POA: Diagnosis not present

## 2021-08-16 DIAGNOSIS — N6310 Unspecified lump in the right breast, unspecified quadrant: Secondary | ICD-10-CM | POA: Insufficient documentation

## 2021-08-16 DIAGNOSIS — Z85820 Personal history of malignant melanoma of skin: Secondary | ICD-10-CM | POA: Insufficient documentation

## 2021-08-16 DIAGNOSIS — Z791 Long term (current) use of non-steroidal anti-inflammatories (NSAID): Secondary | ICD-10-CM | POA: Insufficient documentation

## 2021-08-16 DIAGNOSIS — R634 Abnormal weight loss: Secondary | ICD-10-CM | POA: Insufficient documentation

## 2021-08-16 DIAGNOSIS — Z8541 Personal history of malignant neoplasm of cervix uteri: Secondary | ICD-10-CM | POA: Diagnosis not present

## 2021-08-16 DIAGNOSIS — J449 Chronic obstructive pulmonary disease, unspecified: Secondary | ICD-10-CM | POA: Diagnosis not present

## 2021-08-16 DIAGNOSIS — R042 Hemoptysis: Secondary | ICD-10-CM | POA: Diagnosis not present

## 2021-08-16 DIAGNOSIS — C3490 Malignant neoplasm of unspecified part of unspecified bronchus or lung: Secondary | ICD-10-CM

## 2021-08-16 DIAGNOSIS — C3411 Malignant neoplasm of upper lobe, right bronchus or lung: Secondary | ICD-10-CM | POA: Diagnosis present

## 2021-08-16 NOTE — Progress Notes (Signed)
?Radiation Oncology         [phone] ?________________________________ ? ?Name: April Walsh        MRN: [id number]  ?Date of Service: 08/16/2021 DOB: [birth year] ? ?[id number], Corliss Skains, FNP  Curt Bears, MD    ? ?REFERRING PHYSICIAN: Curt Bears, MD ? ? ?DIAGNOSIS: The encounter diagnosis was Malignant neoplasm of unspecified part of unspecified bronchus or lung ([place]). ? ? ?HISTORY OF PRESENT ILLNESS: Trivia Heffelfinger is a 56 y.o. female seen at the request of Dr. Julien Nordmann for a new diagnosis of lung cancer. The patient was seen by her PCP with complaints of cough and wheezing with small amounts of hemoptysis. She proceeded with a CXR on 07/13/2021 that showed a right suprahilar masslike opacity and 7 mm right upper lobe nodule.  She proceeded with a CT chest with contrast on 07/20/2021.  This showed a 3.1 cm right hilar mass, a 2 cm subcarinal mass, a 9 mm right upper lobe mass and large bilateral adrenal masses concerning for metastatic disease.  Right breast density measuring 1.5 cm was noted.  She underwent bronchoscopy with Dr. Lamonte Sakai on 08/13/2021.  Cytology showed malignancy in the right upper lobe mass biopsy and brushings consistent with adenocarcinoma.  Biopsy of her bronchus intermedius was also similar, as was her brushing from the intermedius.  Given these findings she is seen to discuss treatment recommendations of her cancer.  She has not met with medical oncology yet but will see them next week.  She has a PET scan scheduled tomorrow and MRI brain on Tuesday of next week. ? ? ? ?PREVIOUS RADIATION THERAPY: No ? ? ?PAST MEDICAL HISTORY:  ?Past Medical History:  ?Diagnosis Date  ? Anemia   ? Asthma   ? Bronchitis   ? Cancer [facility])   ? basal cell melanoma, cervical cancer  ? COPD (chronic obstructive pulmonary disease) ([place])   ? "early stages of COPD"  ? Headache   ?   ? ? ?PAST SURGICAL HISTORY: ?Past Surgical History:  ?Procedure Laterality Date  ? APPENDECTOMY    ? 1996  ? BREAST BIOPSY Right    ? 2010-2015  ? BRONCHIAL BIOPSY  08/13/2021  ? Procedure: BRONCHIAL BIOPSIES;  Surgeon: Collene Gobble, MD;  Location: [facility] ENDOSCOPY;  Service: Pulmonary;;  ? BRONCHIAL BRUSHINGS  08/13/2021  ? Procedure: BRONCHIAL BRUSHINGS;  Surgeon: Collene Gobble, MD;  Location: [facility] ENDOSCOPY;  Service: Pulmonary;;  ? BRONCHIAL NEEDLE ASPIRATION BIOPSY  08/13/2021  ? Procedure: BRONCHIAL NEEDLE ASPIRATION BIOPSIES;  Surgeon: Collene Gobble, MD;  Location: [facility] ENDOSCOPY;  Service: Pulmonary;;  ? CESAREAN SECTION    ? 32, 1987  ? FOOT SURGERY Right   ? 2 pins and screw 1999  ? FOOT SURGERY Left 06/21/2021  ? screw and 2 pins  ? HEMOSTASIS CONTROL  08/13/2021  ? Procedure: HEMOSTASIS CONTROL;  Surgeon: Collene Gobble, MD;  Location: [facility] ENDOSCOPY;  Service: Pulmonary;;  ? MELANOMA EXCISION Left   ? basal cell melanoma 2011  ? PARTIAL HYSTERECTOMY    ? 1994  ? TUBAL LIGATION    ? 1987  ? VIDEO BRONCHOSCOPY  08/13/2021  ? Procedure: VIDEO BRONCHOSCOPY WITHOUT FLUORO;  Surgeon: Collene Gobble, MD;  Location: [facility] ENDOSCOPY;  Service: Pulmonary;;  ? VIDEO BRONCHOSCOPY WITH ENDOBRONCHIAL ULTRASOUND N/A 08/13/2021  ? Procedure: VIDEO BRONCHOSCOPY WITH ENDOBRONCHIAL ULTRASOUND;  Surgeon: Collene Gobble, MD;  Location: [facility] ENDOSCOPY;  Service: Pulmonary;  Laterality: N/A;  ? ? ? ?FAMILY HISTORY: No family history  on file. ? ? ?SOCIAL HISTORY:  reports that she has been smoking cigarettes. She has a 16.00 pack-year smoking history. She has been exposed to tobacco smoke. She has never used smokeless tobacco. She reports that she does not drink alcohol and does not use drugs.  The patient is married and.  She is accompanied by her aunt, and she enjoys keeping horses.  ? ? ?ALLERGIES: Patient has no known allergies. ? ? ?MEDICATIONS:  ?Current Outpatient Medications  ?Medication Sig Dispense Refill  ? ADVAIR HFA 115-21 MCG/ACT inhaler Inhale 2 puffs into the lungs 2 (two) times daily.    ? ibuprofen (ADVIL) 200 MG tablet Take 400 mg by mouth every 8  (eight) hours as needed (for pain).    ? ?No current facility-administered medications for this encounter.  ? ? ? ?REVIEW OF SYSTEMS: On review of systems, the patient reports that she is doing okay. She's having trouble with exertional shortness of breath. She describes several weeks now of small spattering of blood in her mucous when she coughs, but denies any more than 1/8 tsp amount of blood at one time. She has lost about 25 pounds in the last year unintentionally. She has some tightness of her chest relieved by her inhaler. She reports some pain in her right chest wall below the level of the axilla. She reports an injury several months ago where she tripped and fell on a wheel barrow and it has been hurting since. She also reports in the 1990s she had a right breast biopsy and was told the findings were benign but could be a risk factor for cancer in the future. She has not continued with routine surveillance mammograms. No other complaints are verbalized.  ? ?  ? ?PHYSICAL EXAM:  ?Wt Readings from Last 3 Encounters:  ?08/13/21 109 lb (49.4 kg)  ?08/02/21 112 lb (50.8 kg)  ? ?Temp Readings from Last 3 Encounters:  ?08/13/21 99 ?F (37.2 ?C)  ?08/02/21 98.4 ?F (36.9 ?C) (Oral)  ? ?BP Readings from Last 3 Encounters:  ?08/13/21 124/79  ?08/02/21 132/78  ? ?Pulse Readings from Last 3 Encounters:  ?08/13/21 98  ?08/02/21 77  ? ? /10 ? ?In general this is a chronically ill appearing caucasian female in no acute distress. She's alert and oriented x4 and appropriate throughout the examination. Cardiopulmonary assessment is negative for acute distress and she exhibits normal effort.  ? ? ? ?ECOG = 1 ? ?0 - Asymptomatic (Fully active, able to carry on all predisease activities without restriction) ? ?1 - Symptomatic but completely ambulatory (Restricted in physically strenuous activity but ambulatory and able to carry out work of a light or sedentary nature. For example, light housework, office work) ? ?2 - Symptomatic,  <50% in bed during the day (Ambulatory and capable of all self care but unable to carry out any work activities. Up and about more than 50% of waking hours) ? ?3 - Symptomatic, >50% in bed, but not bedbound (Capable of only limited self-care, confined to bed or chair 50% or more of waking hours) ? ?4 - Bedbound (Completely disabled. Cannot carry on any self-care. Totally confined to bed or chair) ? ?5 - Death ? ? Oken MM, Creech RH, Tormey DC, et al. [phone]). "Toxicity and response criteria of the [facility]". [place] Oncol. 5 (6): 649-55 ? ? ? ?LABORATORY DATA:  ?Lab Results  ?Component Value Date  ? WBC 7.4 08/13/2021  ? HGB 10.4 (L) 08/13/2021  ?  HCT 32.1 (L) 08/13/2021  ? MCV 97.3 08/13/2021  ? PLT 255 08/13/2021  ? ?No results found for: NA, K, CL, CO2 ?No results found for: ALT, AST, GGT, ALKPHOS, BILITOT ?  ? ?RADIOGRAPHY: DG Chest Port 1 View ? ?Result Date: 08/13/2021 ?CLINICAL DATA:  Status post bronchoscopy and biopsy EXAM: PORTABLE CHEST 1 VIEW COMPARISON:  Chest radiograph 07/13/2021, CT chest 07/20/2021 FINDINGS: The cardiomediastinal silhouette is within normal limits. Confluent opacity in the right upper lobe is consistent with the known right upper lobe mass. There is no pneumothorax. Otherwise, the lungs are clear. There is no pleural effusion. There is no left pneumothorax. There is no acute osseous abnormality. IMPRESSION: Right upper lobe mass again seen with no evidence of complication following biopsy. Electronically Signed   By: Valetta Mole M.D.   On: 08/13/2021 09:24   ?   ? ?IMPRESSION/PLAN: ?1. 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 Dr. Lisbeth Renshaw discusses the pathology findings and reviews the nature of locally advanced versus metastatic lung cancer.  Dr. Lisbeth Renshaw discusses the rationale for her PET scan and MRI brain.  With her imaging findings and symptoms, Dr. Lisbeth Renshaw does offer a  course of radiotherapy to the  chest.   We discussed the risks, benefits, short, and long term effects of radiotherapy, as well as the possibility of either palliative or curative intent, and the patient is interested in proceeding. Dr

## 2021-08-16 NOTE — Progress Notes (Signed)
The proposed treatment discussed in cancer conference is for discussion purpose only and is not a binding recommendation. The patient was not physically examined nor present for their treatment options. Therefore, final treatment plans cannot be decided.  ?

## 2021-08-16 NOTE — Progress Notes (Signed)
? ? [place] ?[phone]   [phone] ? ?CONSULT NOTE ? ?REFERRING PHYSICIAN: Shona Simpson PA ? ?REASON FOR CONSULTATION:  ?Adenocarcinoma ? ?HPI ?April Walsh is a 56 y.o. female with a past medical history significant for tobacco use, COPD/asthma, cervical cancer, basal cell skin cancer, migraines, depression is referred to the clinic for newly diagnosed adenocarcinoma. ? ?The patient's evaluation began in early March 2023 when the patient had been endorsing a cough, wheezing, and small amount of hemoptysis.  This reportedly had been going on for approximately 6 months or so. She subsequently had a chest x-ray performed on 07/13/2021 which reportedly showed a right suprahilar mass opacity and a 7 mm right upper lobe nodule.  To further characterize this, she had a CT of the chest performed on 07/20/2021 which reportedly showed a 3.1 cm right hilar mass, 2 cm subcarinal mass, and 9 x 8 mm right upper lobe mass and large bilateral adrenal masses.  They also noted a 1.5 cm breast density. She estimates her last mammogram was 12 years ago. She is scheduled for another mammogram on 09/05/21. She reportedly had this breast lesion worked up in the past and it was deemed to be a fibrocystic breast density.  ? ?She met with Dr. Lamonte Sakai from pulmonary medicine who arranged bronchoscopy and biopsy. This was performed on 08/13/21. Endobronchial brushings and biopsies were performed in the right upper lobe airway and bronchus intermedius to be sent for cytology and pathology.  There were also biopsies taken from the station 7 lymph node and sent for cytology.  Final pathology ([id number] and [id number]) of the station 7 lymph node showed malignant cells consistent with metastatic adenocarcinoma.  Additionally, the right upper lobe mass, right middle lobe brushings, bronchus intermedius brushing, and bronchus intermedius biopsy are all consistent with malignant cells consistent with  adenocarcinoma.  ? ?She met with Dr. Lisbeth Renshaw from radiation oncology on 08/16/2021 and they are going to arrange for 10 fractions of palliative radiation to the chest.  She is expected to start this on 08/22/2021 and her last dose is expected on 09/04/2021. ? ?She underwent a staging PET scan on 08/17/2021 which showed right hilar mass with narrowing occluding the bronchi of the right lung with associated mediastinal lymphadenopathy, bilateral adrenal metastases, upper abdominal and nodal metastatic disease and intramuscular metastases.  Her brain MRI was performed this morning and is negative for any metastatic disease to the brain.  ? ?Molecular studies have been requested and are pending at this time.  ? ?Regarding her COPD, Dr. Lamonte Sakai is going to consider her for PFTs in the future to quantify the degree of her obstructive lung disease. ? ?Overall, the patient is feeling fair today except she has shortness of breath with exertion and at rest.  Her oxygen is 100 on room air and she is not in any acute distress.  As previously mentioned, she is going to start palliative radiation tomorrow.  She reports that she feels like she is not able to get a deep breath.  Denies any fever, chills, or night sweats.  She lost approximately 20+ pounds since January 2022; however, the patient also lost her daughter at that time and part of the weight loss is associated with the grief from that.  For cough, the patient will take Delsym every 12 hours.  She has not had any hemoptysis in over 2 weeks.  Denies any nausea, vomiting, diarrhea, constipation, headaches, or visual changes. ? ?The patient denies any family  history of malignancy except several of the women on her father side had breast cancer.  The patient's father passed away secondary to a car accident when she was a baby.  The patient's mother had congestive heart failure and diabetes.  She is expected to meet with a genetic counselor tomorrow. ? ?The patient is married and has 1  living son.  She used to work at [employer] for 10 years.  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  She is working on quitting smoking.  She smoked approximately 32 to 33 years averaging half a pack to less than 1 pack of cigarettes per day.  Denies any alcohol or drug use. ? ?.  ?HPI ? ?Past Medical History:  ?Diagnosis Date  ? Anemia   ? Asthma   ? Bronchitis   ? Cancer [facility])   ? basal cell melanoma, cervical cancer  ? COPD (chronic obstructive pulmonary disease) ([place])   ? "early stages of COPD"  ? Headache   ? Lung cancer ([place]) 08/13/2021  ? ? ?Past Surgical History:  ?Procedure Laterality Date  ? APPENDECTOMY    ? 1996  ? BREAST BIOPSY Right   ? 2010-2015  ? BRONCHIAL BIOPSY  08/13/2021  ? Procedure: BRONCHIAL BIOPSIES;  Surgeon: Collene Gobble, MD;  Location: [facility] ENDOSCOPY;  Service: Pulmonary;;  ? BRONCHIAL BRUSHINGS  08/13/2021  ? Procedure: BRONCHIAL BRUSHINGS;  Surgeon: Collene Gobble, MD;  Location[facility] ENDOSCOPY;  Service: Pulmonary;;  ? BRONCHIAL NEEDLE ASPIRATION BIOPSY  08/13/2021  ? Procedure: BRONCHIAL NEEDLE ASPIRATION BIOPSIES;  Surgeon: Collene Gobble, MD;  Location: [facility] ENDOSCOPY;  Service: Pulmonary;;  ? CESAREAN SECTION    ? 59, 1987  ? FOOT SURGERY Right   ? 2 pins and screw 1999  ? FOOT SURGERY Left 06/21/2021  ? screw and 2 pins  ? HEMOSTASIS CONTROL  08/13/2021  ? Procedure: HEMOSTASIS CONTROL;  Surgeon: Collene Gobble, MD;  Location: [facility] ENDOSCOPY;  Service: Pulmonary;;  ? MELANOMA EXCISION Left   ? basal cell melanoma 2011  ? PARTIAL HYSTERECTOMY    ? 1994  ? TUBAL LIGATION    ? 1987  ? VIDEO BRONCHOSCOPY  08/13/2021  ? Procedure: VIDEO BRONCHOSCOPY WITHOUT FLUORO;  Surgeon: Collene Gobble, MD;  Location[facility] ENDOSCOPY;  Service: Pulmonary;;  ? VIDEO BRONCHOSCOPY WITH ENDOBRONCHIAL ULTRASOUND N/A 08/13/2021  ? Procedure: VIDEO BRONCHOSCOPY WITH ENDOBRONCHIAL ULTRASOUND;  Surgeon: Collene Gobble, MD;  Location: [facility] ENDOSCOPY;  Service: Pulmonary;  Laterality: N/A;   ? ? ?Family History  ?Problem Relation Age of Onset  ? Throat cancer Sister   ? Brain cancer Sister   ?     GBM  ? Ovarian cancer Maternal Grandmother   ? ? ?Social History ?Social History  ? ?Tobacco Use  ? Smoking status: Every Day  ?  Packs/day: 0.50  ?  Years: 32.00  ?  Pack years: 16.00  ?  Types: Cigarettes  ?  Passive exposure: Past  ? Smokeless tobacco: Never  ? Tobacco comments:  ?  1/2 pack smoked daily ARJ, RN 08/02/21  ?Vaping Use  ? Vaping Use: Never used  ?Substance Use Topics  ? Alcohol use: Never  ? Drug use: Never  ? ? ?No Known Allergies ? ?Current Outpatient Medications  ?Medication Sig Dispense Refill  ? folic acid (FOLVITE) 1 MG tablet Take 1 tablet (1 mg total) by mouth daily. 30 tablet 2  ? prochlorperazine (COMPAZINE) 10 MG tablet  Take 1 tablet (10 mg total) by mouth every 6 (six) hours as needed. 30 tablet 2  ? ADVAIR HFA 115-21 MCG/ACT inhaler Inhale 2 puffs into the lungs 2 (two) times daily.    ? ibuprofen (ADVIL) 200 MG tablet Take 400 mg by mouth every 8 (eight) hours as needed (for pain).    ? traMADol (ULTRAM) 50 MG tablet Take by mouth.    ? ?No current facility-administered medications for this visit.  ? ?Facility-Administered Medications Ordered in Other Visits  ?Medication Dose Route Frequency Provider Last Rate Last Admin  ? cyanocobalamin ((VITAMIN B-12)) injection 1,000 mcg  1,000 mcg Intramuscular Once Rebekha Diveley L, PA-C      ? ? ?REVIEW OF SYSTEMS:   ?Review of Systems  ?Constitutional: Positive for weight loss.  Negative for appetite change, chills, fatigue, fever.  ?HENT: Negative for mouth sores, nosebleeds, sore throat and trouble swallowing.   ?Eyes: Negative for eye problems and icterus.  ?Respiratory: Positive for cough and shortness of breath.  Negative for hemoptysis in over 2 weeks. ?Cardiovascular: Negative for chest pain and leg swelling.  ?Gastrointestinal: Negative for abdominal pain, constipation, diarrhea, nausea and vomiting.  ?Genitourinary:  Negative for bladder incontinence, difficulty urinating, dysuria, frequency and hematuria.   ?Musculoskeletal: Negative for back pain, gait problem, neck pain and neck stiffness.  ?Skin: Negative for itching and ras

## 2021-08-17 ENCOUNTER — Ambulatory Visit
Admission: RE | Admit: 2021-08-17 | Discharge: 2021-08-17 | Disposition: A | Discharge status: Home / Self Care | Payer: BC Managed Care – PPO | Source: Ambulatory Visit | Attending: Radiation Oncology | Admitting: Radiation Oncology

## 2021-08-17 ENCOUNTER — Encounter (HOSPITAL_COMMUNITY)
Admission: RE | Admit: 2021-08-17 | Discharge: 2021-08-17 | Disposition: A | Discharge status: Home / Self Care | Payer: BC Managed Care – PPO | Source: Ambulatory Visit | Attending: Radiation Oncology | Admitting: Radiation Oncology

## 2021-08-17 ENCOUNTER — Telehealth: Payer: Self-pay | Admitting: Physician Assistant

## 2021-08-17 DIAGNOSIS — Z510 Encounter for antineoplastic radiation therapy: Secondary | ICD-10-CM | POA: Insufficient documentation

## 2021-08-17 DIAGNOSIS — C7972 Secondary malignant neoplasm of left adrenal gland: Secondary | ICD-10-CM | POA: Insufficient documentation

## 2021-08-17 DIAGNOSIS — Z85828 Personal history of other malignant neoplasm of skin: Secondary | ICD-10-CM | POA: Insufficient documentation

## 2021-08-17 DIAGNOSIS — C779 Secondary and unspecified malignant neoplasm of lymph node, unspecified: Secondary | ICD-10-CM | POA: Insufficient documentation

## 2021-08-17 DIAGNOSIS — Z8541 Personal history of malignant neoplasm of cervix uteri: Secondary | ICD-10-CM | POA: Insufficient documentation

## 2021-08-17 DIAGNOSIS — C3491 Malignant neoplasm of unspecified part of right bronchus or lung: Secondary | ICD-10-CM | POA: Insufficient documentation

## 2021-08-17 DIAGNOSIS — F17210 Nicotine dependence, cigarettes, uncomplicated: Secondary | ICD-10-CM | POA: Insufficient documentation

## 2021-08-17 DIAGNOSIS — C3490 Malignant neoplasm of unspecified part of unspecified bronchus or lung: Secondary | ICD-10-CM | POA: Diagnosis not present

## 2021-08-17 DIAGNOSIS — Z5111 Encounter for antineoplastic chemotherapy: Secondary | ICD-10-CM | POA: Insufficient documentation

## 2021-08-17 DIAGNOSIS — Z79899 Other long term (current) drug therapy: Secondary | ICD-10-CM | POA: Insufficient documentation

## 2021-08-17 DIAGNOSIS — J449 Chronic obstructive pulmonary disease, unspecified: Secondary | ICD-10-CM | POA: Insufficient documentation

## 2021-08-17 DIAGNOSIS — C7971 Secondary malignant neoplasm of right adrenal gland: Secondary | ICD-10-CM | POA: Insufficient documentation

## 2021-08-17 DIAGNOSIS — R059 Cough, unspecified: Secondary | ICD-10-CM | POA: Insufficient documentation

## 2021-08-17 DIAGNOSIS — Z803 Family history of malignant neoplasm of breast: Secondary | ICD-10-CM | POA: Insufficient documentation

## 2021-08-17 LAB — GLUCOSE, CAPILLARY: Glucose-Capillary: 87 mg/dL (ref 70–99)

## 2021-08-17 MED ORDER — FLUDEOXYGLUCOSE F - 18 (FDG) INJECTION
5.3300 | Freq: Once | INTRAVENOUS | Status: AC
  Administered 2021-08-17: 5.33 via INTRAVENOUS

## 2021-08-17 NOTE — Telephone Encounter (Signed)
.  Called patient to schedule appointment per 4/6 in[place]akset, patient is aware of date and time.   ?

## 2021-08-18 DIAGNOSIS — C779 Secondary and unspecified malignant neoplasm of lymph node, unspecified: Secondary | ICD-10-CM | POA: Diagnosis not present

## 2021-08-18 DIAGNOSIS — Z79899 Other long term (current) drug therapy: Secondary | ICD-10-CM | POA: Diagnosis not present

## 2021-08-18 DIAGNOSIS — C3491 Malignant neoplasm of unspecified part of right bronchus or lung: Secondary | ICD-10-CM | POA: Diagnosis present

## 2021-08-18 DIAGNOSIS — F17210 Nicotine dependence, cigarettes, uncomplicated: Secondary | ICD-10-CM | POA: Diagnosis not present

## 2021-08-18 DIAGNOSIS — C7971 Secondary malignant neoplasm of right adrenal gland: Secondary | ICD-10-CM | POA: Diagnosis not present

## 2021-08-18 DIAGNOSIS — C7972 Secondary malignant neoplasm of left adrenal gland: Secondary | ICD-10-CM | POA: Diagnosis not present

## 2021-08-18 DIAGNOSIS — Z803 Family history of malignant neoplasm of breast: Secondary | ICD-10-CM | POA: Diagnosis not present

## 2021-08-18 DIAGNOSIS — Z5111 Encounter for antineoplastic chemotherapy: Secondary | ICD-10-CM | POA: Diagnosis not present

## 2021-08-18 DIAGNOSIS — Z85828 Personal history of other malignant neoplasm of skin: Secondary | ICD-10-CM | POA: Diagnosis not present

## 2021-08-18 DIAGNOSIS — Z510 Encounter for antineoplastic radiation therapy: Secondary | ICD-10-CM | POA: Diagnosis present

## 2021-08-18 DIAGNOSIS — R059 Cough, unspecified: Secondary | ICD-10-CM | POA: Diagnosis not present

## 2021-08-18 DIAGNOSIS — J449 Chronic obstructive pulmonary disease, unspecified: Secondary | ICD-10-CM | POA: Diagnosis not present

## 2021-08-18 DIAGNOSIS — Z8541 Personal history of malignant neoplasm of cervix uteri: Secondary | ICD-10-CM | POA: Diagnosis not present

## 2021-08-20 ENCOUNTER — Encounter: Payer: Self-pay | Admitting: *Deleted

## 2021-08-20 NOTE — Progress Notes (Signed)
Oncology Nurse Navigator Documentation ? ? ?  08/20/2021  ?  8:00 AM 08/15/2021  ? 10:00 AM  ?Oncology Nurse Navigator Flowsheets  ?Abnormal Finding Date  07/20/2021  ?Confirmed Diagnosis Date  08/13/2021  ?Diagnosis Status Pending Molecular Studies Pathology Pending  ?Planned Course of Treatment Radiation   ?Phase of Treatment Radiation   ?Radiation Actual Start Date: 08/22/2021   ?Navigator Follow Up Date:  08/21/2021  ?Navigator Follow Up Reason:  New Patient Appointment  ?Navigator Location [facility]  ?Referral Date to RadOnc/MedOnc  08/13/2021  ?Navigator Encounter Type Appt/Treatment Plan Review;Pathology Review Other:;Pathology Review;Scan Review  ?Treatment Initiated Date 08/22/2021   ?Patient Visit Type Other   ?Treatment Phase Pre-Tx/Tx Discussion Abnormal Scans  ?Barriers/Navigation Needs Coordination of Care/I followed up on Ms. Mcguire plan of care. She had PET scan and MRI brain is scheduled for this week. Patient will see med onc on 4/11.  Molecular and PDL 1 testing is pending and according to the [facility] patient portal, results will be completed on 4/20.  Will follow up on systemic therapy plan.  Coordination of Care  ?Interventions Coordination of Care Coordination of Care  ?Acuity Level 2-Minimal Needs (1-2 Barriers Identified) Level 3-Moderate Needs (3-4 Barriers Identified)  ?Coordination of Care Pathology;Other   ?Time Spent with Patient 30 45  ?  ?

## 2021-08-21 ENCOUNTER — Inpatient Hospital Stay: Payer: BC Managed Care – PPO

## 2021-08-21 ENCOUNTER — Inpatient Hospital Stay: Payer: BC Managed Care – PPO | Admitting: Physician Assistant

## 2021-08-21 ENCOUNTER — Telehealth: Payer: Self-pay | Admitting: Radiation Oncology

## 2021-08-21 ENCOUNTER — Other Ambulatory Visit: Payer: Self-pay | Admitting: Internal Medicine

## 2021-08-21 ENCOUNTER — Ambulatory Visit (HOSPITAL_COMMUNITY)
Admission: RE | Admit: 2021-08-21 | Discharge: 2021-08-21 | Disposition: A | Discharge status: Home / Self Care | Payer: BC Managed Care – PPO | Source: Ambulatory Visit | Attending: Radiation Oncology | Admitting: Radiation Oncology

## 2021-08-21 ENCOUNTER — Other Ambulatory Visit: Payer: Self-pay

## 2021-08-21 VITALS — BP 125/85 | HR 90 | Temp 97.9°F | Resp 16 | Ht 60.0 in | Wt 108.3 lb

## 2021-08-21 DIAGNOSIS — Z85828 Personal history of other malignant neoplasm of skin: Secondary | ICD-10-CM | POA: Insufficient documentation

## 2021-08-21 DIAGNOSIS — C3401 Malignant neoplasm of right main bronchus: Secondary | ICD-10-CM

## 2021-08-21 DIAGNOSIS — C3491 Malignant neoplasm of unspecified part of right bronchus or lung: Secondary | ICD-10-CM | POA: Insufficient documentation

## 2021-08-21 DIAGNOSIS — F17210 Nicotine dependence, cigarettes, uncomplicated: Secondary | ICD-10-CM | POA: Insufficient documentation

## 2021-08-21 DIAGNOSIS — C772 Secondary and unspecified malignant neoplasm of intra-abdominal lymph nodes: Secondary | ICD-10-CM

## 2021-08-21 DIAGNOSIS — C3490 Malignant neoplasm of unspecified part of unspecified bronchus or lung: Secondary | ICD-10-CM | POA: Insufficient documentation

## 2021-08-21 DIAGNOSIS — Z8041 Family history of malignant neoplasm of ovary: Secondary | ICD-10-CM

## 2021-08-21 DIAGNOSIS — C3411 Malignant neoplasm of upper lobe, right bronchus or lung: Secondary | ICD-10-CM

## 2021-08-21 DIAGNOSIS — Z801 Family history of malignant neoplasm of trachea, bronchus and lung: Secondary | ICD-10-CM

## 2021-08-21 DIAGNOSIS — C7972 Secondary malignant neoplasm of left adrenal gland: Secondary | ICD-10-CM

## 2021-08-21 DIAGNOSIS — Z79899 Other long term (current) drug therapy: Secondary | ICD-10-CM | POA: Insufficient documentation

## 2021-08-21 DIAGNOSIS — R5383 Other fatigue: Secondary | ICD-10-CM | POA: Insufficient documentation

## 2021-08-21 DIAGNOSIS — C7971 Secondary malignant neoplasm of right adrenal gland: Secondary | ICD-10-CM

## 2021-08-21 DIAGNOSIS — E538 Deficiency of other specified B group vitamins: Secondary | ICD-10-CM | POA: Insufficient documentation

## 2021-08-21 DIAGNOSIS — Z5111 Encounter for antineoplastic chemotherapy: Secondary | ICD-10-CM | POA: Diagnosis not present

## 2021-08-21 DIAGNOSIS — C7989 Secondary malignant neoplasm of other specified sites: Secondary | ICD-10-CM

## 2021-08-21 DIAGNOSIS — Z5112 Encounter for antineoplastic immunotherapy: Secondary | ICD-10-CM | POA: Insufficient documentation

## 2021-08-21 LAB — CBC WITH DIFFERENTIAL (CANCER CENTER ONLY)
Abs Immature Granulocytes: 0.06 10*3/uL (ref 0.00–0.07)
Basophils Absolute: 0.1 10*3/uL (ref 0.0–0.1)
Basophils Relative: 1 %
Eosinophils Absolute: 0.2 10*3/uL (ref 0.0–0.5)
Eosinophils Relative: 3 %
HCT: 32 % — ABNORMAL LOW (ref 36.0–46.0)
Hemoglobin: 10.4 g/dL — ABNORMAL LOW (ref 12.0–15.0)
Immature Granulocytes: 1 %
Lymphocytes Relative: 31 %
Lymphs Abs: 3 10*3/uL (ref 0.7–4.0)
MCH: 30.9 pg (ref 26.0–34.0)
MCHC: 32.5 g/dL (ref 30.0–36.0)
MCV: 95 fL (ref 80.0–100.0)
Monocytes Absolute: 1.1 10*3/uL — ABNORMAL HIGH (ref 0.1–1.0)
Monocytes Relative: 12 %
Neutro Abs: 5.2 10*3/uL (ref 1.7–7.7)
Neutrophils Relative %: 52 %
Platelet Count: 330 10*3/uL (ref 150–400)
RBC: 3.37 MIL/uL — ABNORMAL LOW (ref 3.87–5.11)
RDW: 15.5 % (ref 11.5–15.5)
WBC Count: 9.7 10*3/uL (ref 4.0–10.5)
nRBC: 0 % (ref 0.0–0.2)

## 2021-08-21 LAB — CMP (CANCER CENTER ONLY)
ALT: 6 U/L (ref 0–44)
AST: 9 U/L — ABNORMAL LOW (ref 15–41)
Albumin: 3.5 g/dL (ref 3.5–5.0)
Alkaline Phosphatase: 89 U/L (ref 38–126)
Anion gap: 5 (ref 5–15)
BUN: 19 mg/dL (ref 6–20)
CO2: 25 mmol/L (ref 22–32)
Calcium: 9.1 mg/dL (ref 8.9–10.3)
Chloride: 106 mmol/L (ref 98–111)
Creatinine: 0.45 mg/dL (ref 0.44–1.00)
GFR, Estimated: >60 mL/min (ref >60)
Glucose, Bld: 96 mg/dL (ref 70–99)
Potassium: 4.3 mmol/L (ref 3.5–5.1)
Sodium: 136 mmol/L (ref 135–145)
Total Bilirubin: 0.3 mg/dL (ref 0.3–1.2)
Total Protein: 6.8 g/dL (ref 6.5–8.1)

## 2021-08-21 LAB — TSH: TSH: 0.618 u[IU]/mL (ref 0.308–3.960)

## 2021-08-21 MED ORDER — FOLIC ACID 1 MG PO TABS: 1.0000 mg | ORAL_TABLET | Freq: Every day | ORAL | 2 refills | Status: DC

## 2021-08-21 MED ORDER — GADOBUTROL 1 MMOL/ML IV SOLN
5.0000 mL | Freq: Once | INTRAVENOUS | Status: AC | PRN
  Administered 2021-08-21: 5 mL via INTRAVENOUS

## 2021-08-21 MED ORDER — CYANOCOBALAMIN 1000 MCG/ML IJ SOLN
1000.0000 ug | Freq: Once | INTRAMUSCULAR | Status: AC
  Administered 2021-08-21: 1000 ug via INTRAMUSCULAR
  Filled 2021-08-21: qty 1

## 2021-08-21 MED ORDER — PROCHLORPERAZINE MALEATE 10 MG PO TABS: 10.0000 mg | ORAL_TABLET | Freq: Four times a day (QID) | ORAL | 2 refills | Status: DC | PRN

## 2021-08-21 NOTE — Progress Notes (Signed)
START OFF PATHWAY REGIMEN - Non-Small Cell Lung ? ? ?[id number]:Pembrolizumab 200 mg  IV D1 + Pemetrexed 500 mg/m2 IV D1 + Carboplatin AUC=5 IV D1 q21 Days: ?  A cycle is every 21 days: ?    Pembrolizumab  ?    Pemetrexed  ?    Carboplatin  ? ?**Always confirm dose/schedule in your pharmacy ordering system** ? ?**Administration Notes: This is a tentative treatment plan until the molecular studies are available.  It is not expected to start before the molecular are available ? ?Patient Characteristics: ?Stage IV Metastatic, Nonsquamous, Awaiting Molecular Test Results and Need to Start Chemotherapy, PS = 0, 1 ?Therapeutic Status: Stage IV Metastatic ?Histology: Nonsquamous Cell ?Broad Molecular Profiling Status: Awaiting Molecular Test Results and Need to Start Chemotherapy ?ECOG Performance Status: 1 ?Intent of Therapy: ?Non-Curative / Palliative Intent, Discussed with Patient ?

## 2021-08-21 NOTE — Patient Instructions (Addendum)
Summary:  ?-There are two main categories of lung cancer, they are named based on the size of the cancer cell. One is called Non-Small cell lung cancer. The other type is Small Cell Lung Cancer ?-The sample (biopsy) that they took of your tumor was consistent with a subtype of Non-small cell lung cancer called Adenocarcinoma. This is the most common type of lung cancer.  ?-We covered a lot of important information at your appointment today regarding what the treatment plan is moving forward. Here are the the main points that were discussed at your office visit with [place] today:  ?-We are waiting for the results of the molecular tests. This is looking for genetic markers that we might have pills for. If you have a marker that we have a pill for, then that would be the way to go for treatment. If this is positive, then we call this targeted therapy.  ?-If the molecular tests are negative, then the treatment will be as listed below.  ?-The treatment that you will receive consists of two chemotherapy drugs, called Carboplatin and Alimta (also called Pemetrexed) and one immunotherapy drug called Keytruda (pembrolizumab).  ?-We are planning on starting your treatment next week on 08/29/21 but before your start your treatment, I would like you to attend a Chemotherapy Education Class. This involves having you sit down with one of our nurse educators. She will discuss with your one-on-one more details about your treatment as well as general information about resources here at the cancer center.  ?-Your treatment will be given once every 3 weeks. We will check your labs once a week for the first ~5 treatments just to make sure that important components of your blood are in an acceptable range ?-We will get a CT scan after 3 treatments to check on the progress of treatment ? ?Medications:  ?-I have sent a few important medication prescriptions to your pharmacy.  ?-Compazine was sent to your pharmacy. This medication is for  nausea. You may take this every 6 hours as needed if you feel nauseous.  ?-I have also sent a prescription for 1 mg of folic acid to your pharmacy. We need you to take 1 tablet every day.  ?-We will administer vitamin B12 every 9 weeks while you are here in the clinic. You have received your first dose today.  ? ?Referrals or Imaging: ?-Please continue with the palliative radiation. This is to open your airway but this is not your main treatment. This is to help your breathing symptoms.  ? ? ?Follow up:  ?-We will see you back for a follow up visit 1 week after your first treatment to see how it went and help manage any side effects of treatment that you may have  ? ?-If you need to reach [place] at any time, the main office number to the cancer center is [phone], when you call, ask to speak to either Cassie's or Dr. Worthy Flank nurse.  ? ?

## 2021-08-21 NOTE — Telephone Encounter (Signed)
I called to let the patient know her MRI brain looked good and we discussed the other noncancer related changes that can be followed.  ?

## 2021-08-22 ENCOUNTER — Encounter: Payer: Self-pay | Admitting: Genetic Counselor

## 2021-08-22 ENCOUNTER — Inpatient Hospital Stay (HOSPITAL_BASED_OUTPATIENT_CLINIC_OR_DEPARTMENT_OTHER): Payer: BC Managed Care – PPO | Admitting: Genetic Counselor

## 2021-08-22 ENCOUNTER — Other Ambulatory Visit: Payer: Self-pay | Admitting: Genetic Counselor

## 2021-08-22 ENCOUNTER — Inpatient Hospital Stay: Payer: BC Managed Care – PPO

## 2021-08-22 ENCOUNTER — Encounter (HOSPITAL_COMMUNITY): Payer: Self-pay | Admitting: Internal Medicine

## 2021-08-22 ENCOUNTER — Ambulatory Visit
Admission: RE | Admit: 2021-08-22 | Discharge: 2021-08-22 | Disposition: A | Discharge status: Home / Self Care | Payer: BC Managed Care – PPO | Source: Ambulatory Visit | Attending: Radiation Oncology | Admitting: Radiation Oncology

## 2021-08-22 DIAGNOSIS — C3491 Malignant neoplasm of unspecified part of right bronchus or lung: Secondary | ICD-10-CM | POA: Diagnosis not present

## 2021-08-22 DIAGNOSIS — Z801 Family history of malignant neoplasm of trachea, bronchus and lung: Secondary | ICD-10-CM | POA: Insufficient documentation

## 2021-08-22 DIAGNOSIS — C3401 Malignant neoplasm of right main bronchus: Secondary | ICD-10-CM

## 2021-08-22 DIAGNOSIS — Z5111 Encounter for antineoplastic chemotherapy: Secondary | ICD-10-CM | POA: Diagnosis not present

## 2021-08-22 DIAGNOSIS — Z1379 Encounter for other screening for genetic and chromosomal anomalies: Secondary | ICD-10-CM

## 2021-08-22 LAB — GENETIC SCREENING ORDER

## 2021-08-22 NOTE — Progress Notes (Signed)
REFERRING PROVIDER: ?Shona Simpson, PA-C ? ?PRIMARY PROVIDER:  ?[name], FNP ? ?PRIMARY REASON FOR VISIT:  ?1. Adenocarcinoma of right lung, stage 4 (HCC)   ?2. Family history of lung cancer   ? ?HISTORY OF PRESENT ILLNESS:   ?April Walsh, a 56 y.o. female, was seen for a [place] cancer genetics consultation at the request of Shona Simpson, PA-C due to a personal and family history of cancer.  April Walsh presents to clinic today to discuss the possibility of a hereditary predisposition to cancer, to discuss genetic testing, and to further clarify her future cancer risks, as well as potential cancer risks for family members.  ? ?In April 2023, at the age of 17, April Walsh was diagnosed with stage IV lung cancer. She also has a history of cervical cancer and basal cell carcinoma.  ? ?CANCER HISTORY:  ?Oncology History  ?Adenocarcinoma of right lung, stage 4 ([place])  ?08/21/2021 Initial Diagnosis  ? Adenocarcinoma of right lung, stage 4 ([place]) ?  ?08/21/2021 Cancer Staging  ? Staging form: Lung, AJCC 8th Edition ?- Clinical: Stage IVB (cT2b, cN2, cM1c) - Signed by Curt Bears, MD on 08/21/2021 ? ?  ?08/28/2021 -  Chemotherapy  ? Patient is on Treatment Plan : LUNG Carboplatin (5) + Pemetrexed (500) + Pembrolizumab (200) D1 q21d Induction x 4 cycles / Maintenance Pemetrexed (500) + Pembrolizumab (200) D1 q21d  ?   ? ? ?RISK FACTORS:  ?Menarche was at age 60.  ?First live birth at age 78.  ?OCP use for approximately 0 years.  ?Ovaries intact: yes.  ?Uterus intact: yes.  ?HRT use: 0 years. ?Colonoscopy: no ?Mammogram within the last year: no. ?Number of breast biopsies: 1. ?Any excessive radiation exposure in the past: no ? ?Past Medical History:  ?Diagnosis Date  ? Anemia   ? Asthma   ? Bronchitis   ? Cancer [facility])   ? basal cell melanoma, cervical cancer  ? COPD (chronic obstructive pulmonary disease) ([place])   ? "early stages of COPD"  ? Headache   ? Lung cancer ([place]) 08/13/2021  ? ? ?Past Surgical  History:  ?Procedure Laterality Date  ? APPENDECTOMY    ? 1996  ? BREAST BIOPSY Right   ? 2010-2015  ? BRONCHIAL BIOPSY  08/13/2021  ? Procedure: BRONCHIAL BIOPSIES;  Surgeon: Collene Gobble, MD;  Location: [facility] ENDOSCOPY;  Service: Pulmonary;;  ? BRONCHIAL BRUSHINGS  08/13/2021  ? Procedure: BRONCHIAL BRUSHINGS;  Surgeon: Collene Gobble, MD;  Location: [facility] ENDOSCOPY;  Service: Pulmonary;;  ? BRONCHIAL NEEDLE ASPIRATION BIOPSY  08/13/2021  ? Procedure: BRONCHIAL NEEDLE ASPIRATION BIOPSIES;  Surgeon: Collene Gobble, MD;  Location: [facility] ENDOSCOPY;  Service: Pulmonary;;  ? CESAREAN SECTION    ? 64, 1987  ? FOOT SURGERY Right   ? 2 pins and screw 1999  ? FOOT SURGERY Left 06/21/2021  ? screw and 2 pins  ? HEMOSTASIS CONTROL  08/13/2021  ? Procedure: HEMOSTASIS CONTROL;  Surgeon: Collene Gobble, MD;  Location: [facility] ENDOSCOPY;  Service: Pulmonary;;  ? MELANOMA EXCISION Left   ? basal cell melanoma 2011  ? PARTIAL HYSTERECTOMY    ? 1994  ? TUBAL LIGATION    ? 1987  ? VIDEO BRONCHOSCOPY  08/13/2021  ? Procedure: VIDEO BRONCHOSCOPY WITHOUT FLUORO;  Surgeon: Collene Gobble, MD;  Location: [facility] ENDOSCOPY;  Service: Pulmonary;;  ? VIDEO BRONCHOSCOPY WITH ENDOBRONCHIAL ULTRASOUND N/A 08/13/2021  ? Procedure: VIDEO BRONCHOSCOPY WITH ENDOBRONCHIAL ULTRASOUND;  Surgeon: Collene Gobble, MD;  Location: [facility] ENDOSCOPY;  Service: Pulmonary;  Laterality: N/A;  ? ? ?Social History  ? ?Socioeconomic History  ? Marital status: Married  ?  Spouse name: Not on file  ? Number of children: Not on file  ? Years of education: Not on file  ? Highest education level: Not on file  ?Occupational History  ? Not on file  ?Tobacco Use  ? Smoking status: Every Day  ?  Packs/day: 0.50  ?  Years: 32.00  ?  Pack years: 16.00  ?  Types: Cigarettes  ?  Passive exposure: Past  ? Smokeless tobacco: Never  ? Tobacco comments:  ?  1/2 pack smoked daily ARJ, RN 08/02/21  ?Vaping Use  ? Vaping Use: Never used  ?Substance and Sexual Activity  ? Alcohol use: Never  ? Drug use:  Never  ? Sexual activity: Not on file  ?Other Topics Concern  ? Not on file  ?Social History Narrative  ? Not on file  ? ?Social Determinants of Health  ? ?Financial Resource Strain: Not on file  ?Food Insecurity: Not on file  ?Transportation Needs: Not on file  ?Physical Activity: Not on file  ?Stress: Not on file  ?Social Connections: Not on file  ?  ? ?FAMILY HISTORY:  ?We obtained a detailed, 4-generation family history.  Significant diagnoses are listed below: ?Family History  ?Problem Relation Age of Onset  ? Brain cancer Maternal Aunt   ? Throat cancer Maternal Aunt   ? Lung cancer Paternal Uncle   ?     he smoked  ? Lung cancer Paternal Uncle   ?     he smoked  ? Ovarian cancer Maternal Grandmother   ? Lung cancer Cousin   ? ? ? ? ?April Walsh has three full maternal aunts. One of her maternal aunts was diagnosed with brain cancer at an unknown age and a second maternal aunt was diagnosed with throat cancer at an unknown age, she smoked. Her maternal grandmother was diagnosed with ovarian cancer and died at age 70. Of note, her maternal aunt reports that it may have been cervical cancer.  ? ?April Walsh's paternal uncle was diagnosed with lung cancer, he smoked. She has a paternal half-uncle who was diagnosed with lung cancer, he smoked. One paternal half-aunt was diagnosed with breast cancer at an unknown age. She also has one paternal cousin who possibly had lung cancer.  ? ?April Walsh is unaware of previous family history of genetic testing for hereditary cancer risks. There is no reported Ashkenazi Jewish ancestry.  ? ?GENETIC COUNSELING ASSESSMENT: April Walsh is a 56 y.o. female with a personal and family history of cancer which is somewhat suggestive of a hereditary predisposition to cancer. We, therefore, discussed and recommended the following at today's visit.  ? ?DISCUSSION: We discussed that 5 - 10% of cancer is hereditary, with most cases of lung cancer associated with EGFR and most cases of  breast and ovarian cancer associated with BRCA1/2.  There are other genes that can be associated with hereditary lung and ovarian cancer syndromes.  We discussed that testing is beneficial for several reasons including knowing how to follow individuals after completing their treatment and understanding if other family members could be at an increased risk for cancer.  ? ?We reviewed the characteristics, features and inheritance patterns of hereditary cancer syndromes. We also discussed genetic testing, including the appropriate family members to test, the process of testing, insurance coverage and turn-around-time for results. We discussed the  implications of a negative, positive, carrier and/or variant of uncertain significant result. We recommended April Walsh pursue genetic testing for a panel that includes genes associated with lung, breast, and ovarian cancer.  ? ?April Walsh elected to have [facility] CancerNext-Expanded Panel. The CancerNext-Expanded gene panel offered by [facility] and includes sequencing, rearrangement, and RNA analysis for the following 77 genes: AIP, ALK, APC, ATM, AXIN2, BAP1, BARD1, BLM, BMPR1A, BRCA1, BRCA2, BRIP1, CDC73, CDH1, CDK4, CDKN1B, CDKN2A, CHEK2, CTNNA1, DICER1, FANCC, FH, FLCN, GALNT12, KIF1B, LZTR1, MAX, MEN1, MET, MLH1, MSH2, MSH3, MSH6, MUTYH, NBN, NF1, NF2, NTHL1, PALB2, PHOX2B, PMS2, POT1, PRKAR1A, PTCH1, PTEN, RAD51C, RAD51D, RB1, RECQL, RET, SDHA, SDHAF2, SDHB, SDHC, SDHD, SMAD4, SMARCA4, SMARCB1, SMARCE1, STK11, SUFU, TMEM127, TP53, TSC1, TSC2, VHL and XRCC2 (sequencing and deletion/duplication); EGFR, EGLN1, HOXB13, KIT, MITF, PDGFRA, POLD1, and POLE (sequencing only); EPCAM and GREM1 (deletion/duplication only).  ? ?Based on April Walsh's personal and family history of cancer, she meets medical criteria for genetic testing. Despite that she meets criteria, she may still have an out of pocket cost. We discussed that if her out of pocket cost for testing is over $100,  the laboratory will call and confirm whether she wants to proceed with testing.  If the out of pocket cost of testing is less than $100 she will be billed by the genetic testing laboratory.  ? ?PLAN: After co

## 2021-08-23 ENCOUNTER — Other Ambulatory Visit: Payer: Self-pay

## 2021-08-23 ENCOUNTER — Ambulatory Visit
Admission: RE | Admit: 2021-08-23 | Discharge: 2021-08-23 | Disposition: A | Discharge status: Home / Self Care | Payer: BC Managed Care – PPO | Source: Ambulatory Visit | Attending: Radiation Oncology | Admitting: Radiation Oncology

## 2021-08-23 DIAGNOSIS — Z5111 Encounter for antineoplastic chemotherapy: Secondary | ICD-10-CM | POA: Diagnosis not present

## 2021-08-24 ENCOUNTER — Encounter: Payer: Self-pay | Admitting: *Deleted

## 2021-08-24 ENCOUNTER — Ambulatory Visit
Admission: RE | Admit: 2021-08-24 | Discharge: 2021-08-24 | Disposition: A | Discharge status: Home / Self Care | Payer: BC Managed Care – PPO | Source: Ambulatory Visit | Attending: Radiation Oncology | Admitting: Radiation Oncology

## 2021-08-24 ENCOUNTER — Inpatient Hospital Stay: Payer: BC Managed Care – PPO

## 2021-08-24 ENCOUNTER — Telehealth: Payer: Self-pay | Admitting: Internal Medicine

## 2021-08-24 ENCOUNTER — Encounter (HOSPITAL_COMMUNITY): Payer: Self-pay | Admitting: Internal Medicine

## 2021-08-24 DIAGNOSIS — Z5111 Encounter for antineoplastic chemotherapy: Secondary | ICD-10-CM | POA: Diagnosis not present

## 2021-08-24 NOTE — Telephone Encounter (Signed)
Scheduled per 04/11 los, patient has been called and notified. ?

## 2021-08-24 NOTE — Progress Notes (Signed)
Oncology Nurse Navigator Documentation ? ? ?  08/24/2021  ?  2:00 PM 08/20/2021  ?  8:00 AM 08/15/2021  ? 10:00 AM  ?Oncology Nurse Navigator Flowsheets  ?Abnormal Finding Date   07/20/2021  ?Confirmed Diagnosis Date   08/13/2021  ?Diagnosis Status  Pending Molecular Studies Pathology Pending  ?Planned Course of Treatment Chemotherapy Radiation   ?Phase of Treatment Chemo Radiation   ?Chemotherapy Actual Start Date: 08/29/2021    ?Radiation Actual Start Date:  08/22/2021   ?Navigator Follow Up Date: 08/29/2021  08/21/2021  ?Navigator Follow Up Reason: Chemotherapy;Follow-up Appointment  New Patient Appointment  ?Navigator Location [facility]  ?Referral Date to RadOnc/MedOnc   08/13/2021  ?Navigator Encounter Type Appt/Treatment Plan Review Appt/Treatment Plan Review;Pathology Review Other:;Pathology Review;Scan Review  ?Treatment Initiated Date  08/22/2021   ?Patient Visit Type  Other   ?Treatment Phase Treatment Pre-Tx/Tx Discussion Abnormal Scans  ?Barriers/Navigation Needs Coordination of Care/I followed up on Ms. Decandia tx schedule and she is set up at this time.   Coordination of Care Coordination of Care  ?Interventions Coordination of Care Coordination of Care Coordination of Care  ?Acuity Level 2-Minimal Needs (1-2 Barriers Identified) Level 2-Minimal Needs (1-2 Barriers Identified) Level 3-Moderate Needs (3-4 Barriers Identified)  ?Coordination of Care Other Pathology;Other   ?Time Spent with Patient 30 30 45  ?  ?

## 2021-08-27 ENCOUNTER — Ambulatory Visit
Admission: RE | Admit: 2021-08-27 | Discharge: 2021-08-27 | Disposition: A | Discharge status: Home / Self Care | Payer: BC Managed Care – PPO | Source: Ambulatory Visit | Attending: Radiation Oncology | Admitting: Radiation Oncology

## 2021-08-27 ENCOUNTER — Other Ambulatory Visit: Payer: Self-pay

## 2021-08-27 DIAGNOSIS — Z5111 Encounter for antineoplastic chemotherapy: Secondary | ICD-10-CM | POA: Diagnosis not present

## 2021-08-28 ENCOUNTER — Telehealth: Payer: Self-pay | Admitting: Medical Oncology

## 2021-08-28 ENCOUNTER — Encounter: Payer: Self-pay | Admitting: *Deleted

## 2021-08-28 ENCOUNTER — Ambulatory Visit
Admission: RE | Admit: 2021-08-28 | Discharge: 2021-08-28 | Disposition: A | Discharge status: Home / Self Care | Payer: BC Managed Care – PPO | Source: Ambulatory Visit | Attending: Radiation Oncology | Admitting: Radiation Oncology

## 2021-08-28 ENCOUNTER — Other Ambulatory Visit: Payer: Self-pay

## 2021-08-28 DIAGNOSIS — Z5111 Encounter for antineoplastic chemotherapy: Secondary | ICD-10-CM | POA: Diagnosis not present

## 2021-08-28 LAB — RAD ONC ARIA SESSION SUMMARY
Course Elapsed Days: 6
Plan Fractions Treated to Date: 5
Plan Prescribed Dose Per Fraction: 3 Gy
Plan Total Fractions Prescribed: 10
Plan Total Prescribed Dose: 30 Gy
Reference Point Dosage Given to Date: 15 Gy
Reference Point Session Dosage Given: 3 Gy
Session Number: 5

## 2021-08-28 NOTE — Progress Notes (Signed)
Oncology Nurse Navigator Documentation ? ? ?  08/28/2021  ?  9:00 AM 08/24/2021  ?  2:00 PM 08/20/2021  ?  8:00 AM 08/15/2021  ? 10:00 AM  ?Oncology Nurse Navigator Flowsheets  ?Abnormal Finding Date    07/20/2021  ?Confirmed Diagnosis Date    08/13/2021  ?Diagnosis Status   Pending Molecular Studies Pathology Pending  ?Planned Course of Treatment  Chemotherapy Radiation   ?Phase of Treatment  Chemo Radiation   ?Chemotherapy Actual Start Date:  08/29/2021    ?Radiation Actual Start Date:   08/22/2021   ?Navigator Follow Up Date:  08/29/2021  08/21/2021  ?Navigator Follow Up Reason:  Chemotherapy;Follow-up Appointment  New Patient Appointment  ?Navigator Location [facility]  ?Referral Date to RadOnc/MedOnc    08/13/2021  ?Navigator Encounter Type Molecular Studies Appt/Treatment Plan Review Appt/Treatment Plan Review;Pathology Review Other:;Pathology Review;Scan Review  ?Treatment Initiated Date   08/22/2021   ?Patient Visit Type   Other   ?Treatment Phase  Treatment Pre-Tx/Tx Discussion Abnormal Scans  ?Barriers/Navigation Needs Coordination of Care/I received a message from pathology that [employer] needed more information to process test. I completed forms and will fax to [facility].  Coordination of Care Coordination of Care Coordination of Care  ?Interventions Coordination of Care Coordination of Care Coordination of Care Coordination of Care  ?Acuity Level 2-Minimal Needs (1-2 Barriers Identified) Level 2-Minimal Needs (1-2 Barriers Identified) Level 2-Minimal Needs (1-2 Barriers Identified) Level 3-Moderate Needs (3-4 Barriers Identified)  ?Coordination of Care Pathology Other Pathology;Other   ?Time Spent with Patient 30 30 30  45  ?  ?

## 2021-08-28 NOTE — Telephone Encounter (Signed)
Keytruda and alimta  approved .  ?

## 2021-08-29 ENCOUNTER — Inpatient Hospital Stay: Payer: BC Managed Care – PPO

## 2021-08-29 ENCOUNTER — Ambulatory Visit
Admission: RE | Admit: 2021-08-29 | Discharge: 2021-08-29 | Disposition: A | Discharge status: Home / Self Care | Payer: BC Managed Care – PPO | Source: Ambulatory Visit | Attending: Radiation Oncology | Admitting: Radiation Oncology

## 2021-08-29 ENCOUNTER — Inpatient Hospital Stay (HOSPITAL_BASED_OUTPATIENT_CLINIC_OR_DEPARTMENT_OTHER): Payer: BC Managed Care – PPO | Admitting: Internal Medicine

## 2021-08-29 ENCOUNTER — Other Ambulatory Visit: Payer: Self-pay

## 2021-08-29 VITALS — BP 117/78 | HR 104 | Temp 97.6°F | Resp 18 | Ht 60.0 in | Wt 109.1 lb

## 2021-08-29 VITALS — HR 78

## 2021-08-29 DIAGNOSIS — Z5111 Encounter for antineoplastic chemotherapy: Secondary | ICD-10-CM

## 2021-08-29 DIAGNOSIS — C3491 Malignant neoplasm of unspecified part of right bronchus or lung: Secondary | ICD-10-CM

## 2021-08-29 DIAGNOSIS — Z5112 Encounter for antineoplastic immunotherapy: Secondary | ICD-10-CM | POA: Diagnosis not present

## 2021-08-29 DIAGNOSIS — C3401 Malignant neoplasm of right main bronchus: Secondary | ICD-10-CM

## 2021-08-29 DIAGNOSIS — Z1379 Encounter for other screening for genetic and chromosomal anomalies: Secondary | ICD-10-CM

## 2021-08-29 LAB — CBC WITH DIFFERENTIAL (CANCER CENTER ONLY)
Abs Immature Granulocytes: 0.03 10*3/uL (ref 0.00–0.07)
Basophils Absolute: 0 10*3/uL (ref 0.0–0.1)
Basophils Relative: 1 %
Eosinophils Absolute: 0.2 10*3/uL (ref 0.0–0.5)
Eosinophils Relative: 3 %
HCT: 32.7 % — ABNORMAL LOW (ref 36.0–46.0)
Hemoglobin: 10.6 g/dL — ABNORMAL LOW (ref 12.0–15.0)
Immature Granulocytes: 0 %
Lymphocytes Relative: 17 %
Lymphs Abs: 1.3 10*3/uL (ref 0.7–4.0)
MCH: 30.9 pg (ref 26.0–34.0)
MCHC: 32.4 g/dL (ref 30.0–36.0)
MCV: 95.3 fL (ref 80.0–100.0)
Monocytes Absolute: 0.8 10*3/uL (ref 0.1–1.0)
Monocytes Relative: 10 %
Neutro Abs: 5.4 10*3/uL (ref 1.7–7.7)
Neutrophils Relative %: 69 %
Platelet Count: 281 10*3/uL (ref 150–400)
RBC: 3.43 MIL/uL — ABNORMAL LOW (ref 3.87–5.11)
RDW: 15.2 % (ref 11.5–15.5)
WBC Count: 7.8 10*3/uL (ref 4.0–10.5)
nRBC: 0 % (ref 0.0–0.2)

## 2021-08-29 LAB — CMP (CANCER CENTER ONLY)
ALT: 6 U/L (ref 0–44)
AST: 11 U/L — ABNORMAL LOW (ref 15–41)
Albumin: 3.5 g/dL (ref 3.5–5.0)
Alkaline Phosphatase: 86 U/L (ref 38–126)
Anion gap: 4 — ABNORMAL LOW (ref 5–15)
BUN: 17 mg/dL (ref 6–20)
CO2: 28 mmol/L (ref 22–32)
Calcium: 9.1 mg/dL (ref 8.9–10.3)
Chloride: 106 mmol/L (ref 98–111)
Creatinine: 0.41 mg/dL — ABNORMAL LOW (ref 0.44–1.00)
GFR, Estimated: >60 mL/min (ref >60)
Glucose, Bld: 71 mg/dL (ref 70–99)
Potassium: 3.8 mmol/L (ref 3.5–5.1)
Sodium: 138 mmol/L (ref 135–145)
Total Bilirubin: 0.2 mg/dL — ABNORMAL LOW (ref 0.3–1.2)
Total Protein: 6.7 g/dL (ref 6.5–8.1)

## 2021-08-29 LAB — RAD ONC ARIA SESSION SUMMARY
Course Elapsed Days: 7
Plan Fractions Treated to Date: 6
Plan Prescribed Dose Per Fraction: 3 Gy
Plan Total Fractions Prescribed: 10
Plan Total Prescribed Dose: 30 Gy
Reference Point Dosage Given to Date: 18 Gy
Reference Point Session Dosage Given: 3 Gy
Session Number: 6

## 2021-08-29 LAB — GENETIC SCREENING ORDER

## 2021-08-29 MED ORDER — SODIUM CHLORIDE 0.9 % IV SOLN
150.0000 mg | Freq: Once | INTRAVENOUS | Status: AC
  Administered 2021-08-29: 150 mg via INTRAVENOUS
  Filled 2021-08-29: qty 150

## 2021-08-29 MED ORDER — SODIUM CHLORIDE 0.9 % IV SOLN
200.0000 mg | Freq: Once | INTRAVENOUS | Status: AC
  Administered 2021-08-29: 200 mg via INTRAVENOUS
  Filled 2021-08-29: qty 200

## 2021-08-29 MED ORDER — PALONOSETRON HCL INJECTION 0.25 MG/5ML
0.2500 mg | Freq: Once | INTRAVENOUS | Status: AC
  Administered 2021-08-29: 0.25 mg via INTRAVENOUS

## 2021-08-29 MED ORDER — SODIUM CHLORIDE 0.9 % IV SOLN
429.5000 mg | Freq: Once | INTRAVENOUS | Status: AC
  Administered 2021-08-29: 430 mg via INTRAVENOUS
  Filled 2021-08-29: qty 43

## 2021-08-29 MED ORDER — SODIUM CHLORIDE 0.9 % IV SOLN: Freq: Once | INTRAVENOUS | Status: AC

## 2021-08-29 MED ORDER — SODIUM CHLORIDE 0.9 % IV SOLN
500.0000 mg/m2 | Freq: Once | INTRAVENOUS | Status: AC
  Administered 2021-08-29: 700 mg via INTRAVENOUS
  Filled 2021-08-29: qty 20

## 2021-08-29 MED ORDER — SODIUM CHLORIDE 0.9 % IV SOLN
10.0000 mg | Freq: Once | INTRAVENOUS | Status: AC
  Administered 2021-08-29: 10 mg via INTRAVENOUS
  Filled 2021-08-29: qty 10

## 2021-08-29 NOTE — Patient Instructions (Signed)
[facility]  Discharge Instructions: ?Thank you for choosing [facility] to provide your oncology and hematology care.  ? ?If you have a lab appointment with the [facility], please go directly to the [facility] and check in at the registration area. ?  ?Wear comfortable clothing and clothing appropriate for easy access to any Portacath or PICC line.  ? ?We strive to give you quality time with your provider. You may need to reschedule your appointment if you arrive late (15 or more minutes).  Arriving late affects you and other patients whose appointments are after yours.  Also, if you miss three or more appointments without notifying the office, you may be dismissed from the clinic at the provider?s discretion.    ?  ?For prescription refill requests, have your pharmacy contact our office and allow 72 hours for refills to be completed.   ? ?Today you received the following chemotherapy and/or immunotherapy agents: Pembrolizumab, Pemetrexed, Carboplatin    ?  ?To help prevent nausea and vomiting after your treatment, we encourage you to take your nausea medication as directed. ? ?BELOW ARE SYMPTOMS THAT SHOULD BE REPORTED IMMEDIATELY: ?*FEVER GREATER THAN 100.4 F (38 ?C) OR HIGHER ?*CHILLS OR SWEATING ?*NAUSEA AND VOMITING THAT IS NOT CONTROLLED WITH YOUR NAUSEA MEDICATION ?*UNUSUAL SHORTNESS OF BREATH ?*UNUSUAL BRUISING OR BLEEDING ?*URINARY PROBLEMS (pain or burning when urinating, or frequent urination) ?*BOWEL PROBLEMS (unusual diarrhea, constipation, pain near the anus) ?TENDERNESS IN MOUTH AND THROAT WITH OR WITHOUT PRESENCE OF ULCERS (sore throat, sores in mouth, or a toothache) ?UNUSUAL RASH, SWELLING OR PAIN  ?UNUSUAL VAGINAL DISCHARGE OR ITCHING  ? ?Items with * indicate a potential emergency and should be followed up as soon as possible or go to the Emergency Department if any problems should occur. ? ?Please show the CHEMOTHERAPY ALERT CARD or  IMMUNOTHERAPY ALERT CARD at check-in to the Emergency Department and triage nurse. ? ?Should you have questions after your visit or need to cancel or reschedule your appointment, please contact [facility]  Dept: [phone]  and follow the prompts.  Office hours are 8:00 a.m. to 4:30 p.m. Monday - Friday. Please note that voicemails left after 4:00 p.m. may not be returned until the following business day.  We are closed weekends and major holidays. You have access to a nurse at all times for urgent questions. Please call the main number to the clinic Dept: [phone] and follow the prompts. ? ? ?For any non-urgent questions, you may also contact your provider using MyChart. We now offer e-Visits for anyone 60 and older to request care online for non-urgent symptoms. For details visit [email or web address]. ?  ?Also download the MyChart app! Go to the app store, search "MyChart", open the app, select [facility], and log in with your MyChart username and password. ? ?Due to Covid, a mask is required upon entering the hospital/clinic. If you do not have a mask, one will be given to you upon arrival. For doctor visits, patients may have 1 support Conlan Miceli aged 61 or older with them. For treatment visits, patients cannot have anyone with them due to current Covid guidelines and our immunocompromised population.  ? ? Pembrolizumab injection ?What is this medication? ?PEMBROLIZUMAB (pem broe liz ue mab) is a monoclonal antibody. It is used to treat certain types of cancer. ?This medicine may be used for other purposes; ask your health care provider or pharmacist if you have questions. ?COMMON BRAND  NAME(S): Keytruda ?What should I tell my care team before I take this medication? ?They need to know if you have any of these conditions: ?autoimmune diseases like Crohn's disease, ulcerative colitis, or lupus ?have had or planning to have an allogeneic stem cell transplant (uses someone  else's stem cells) ?history of organ transplant ?history of chest radiation ?nervous system problems like myasthenia gravis or Guillain-Barre syndrome ?an unusual or allergic reaction to pembrolizumab, other medicines, foods, dyes, or preservatives ?pregnant or trying to get pregnant ?breast-feeding ?How should I use this medication? ?This medicine is for infusion into a vein. It is given by a health care professional in a hospital or clinic setting. ?A special MedGuide will be given to you before each treatment. Be sure to read this information carefully each time. ?Talk to your pediatrician regarding the use of this medicine in children. While this drug may be prescribed for children as young as 6 months for selected conditions, precautions do apply. ?Overdosage: If you think you have taken too much of this medicine contact a poison control center or emergency room at once. ?NOTE: This medicine is only for you. Do not share this medicine with others. ?What if I miss a dose? ?It is important not to miss your dose. Call your doctor or health care professional if you are unable to keep an appointment. ?What may interact with this medication? ?Interactions have not been studied. ?This list may not describe all possible interactions. Give your health care provider a list of all the medicines, herbs, non-prescription drugs, or dietary supplements you use. Also tell them if you smoke, drink alcohol, or use illegal drugs. Some items may interact with your medicine. ?What should I watch for while using this medication? ?Your condition will be monitored carefully while you are receiving this medicine. ?You may need blood work done while you are taking this medicine. ?Do not become pregnant while taking this medicine or for 4 months after stopping it. Women should inform their doctor if they wish to become pregnant or think they might be pregnant. There is a potential for serious side effects to an unborn child. Talk to your  health care professional or pharmacist for more information. Do not breast-feed an infant while taking this medicine or for 4 months after the last dose. ?What side effects may I notice from receiving this medication? ?Side effects that you should report to your doctor or health care professional as soon as possible: ?allergic reactions like skin rash, itching or hives, swelling of the face, lips, or tongue ?bloody or black, tarry ?breathing problems ?changes in vision ?chest pain ?chills ?confusion ?constipation ?cough ?diarrhea ?dizziness or feeling faint or lightheaded ?fast or irregular heartbeat ?fever ?flushing ?joint pain ?low blood counts - this medicine may decrease the number of white blood cells, red blood cells and platelets. You may be at increased risk for infections and bleeding. ?muscle pain ?muscle weakness ?pain, tingling, numbness in the hands or feet ?persistent headache ?redness, blistering, peeling or loosening of the skin, including inside the mouth ?signs and symptoms of high blood sugar such as dizziness; dry mouth; dry skin; fruity breath; nausea; stomach pain; increased hunger or thirst; increased urination ?signs and symptoms of kidney injury like trouble passing urine or change in the amount of urine ?signs and symptoms of liver injury like dark urine, light-colored stools, loss of appetite, nausea, right upper belly pain, yellowing of the eyes or skin ?sweating ?swollen lymph nodes ?weight loss ?Side effects that usually do  not require medical attention (report to your doctor or health care professional if they continue or are bothersome): ?decreased appetite ?hair loss ?tiredness ?This list may not describe all possible side effects. Call your doctor for medical advice about side effects. You may report side effects to FDA at 1-800-FDA-1088. ?Where should I keep my medication? ?This drug is given in a hospital or clinic and will not be stored at home. ?NOTE: This sheet is a summary. It  may not cover all possible information. If you have questions about this medicine, talk to your doctor, pharmacist, or health care provider. ?? 2023 Elsevier/Gold Standard (2021-03-30 00:00:00) ? ?Pemetrexed inject

## 2021-08-29 NOTE — Progress Notes (Signed)
?    [facility] ?[phone]   [phone] ? ?OFFICE PROGRESS NOTE ? ?[name], FNP ?[phone] ?[place] [address] ? ?DIAGNOSIS: Stage IV (T2b, N2, M1 C) non-small cell lung cancer, adenocarcinoma presented with right hilar mass in addition to mediastinal and upper abdominal lymphadenopathy in addition to bilateral adrenal metastases and metastatic disease in the musculature of the lower back.  This was diagnosed in April 2023. ? ?DETECTED ALTERATION(S) / BIOMARKER(S) % CFDNA OR AMPLIFICATION ASSOCIATED FDA-APPROVED THERAPIES CLINICAL TRIAL AVAILABILITY ?[id number] ?0.5% ?None  ?Yes ?[id number] ?(Splice Site Indel) ?0.6% ?None  ?Yes ? ?PD-L1 expression 0% ? ?PRIOR THERAPY: Palliative radiotherapy to the right hilar region under the care of Dr. Lisbeth Renshaw. ? ?CURRENT THERAPY:  systemic chemotherapy with carboplatin for AUC of 5, Alimta 500 Mg/M2 and Keytruda 200 Mg IV every 3 weeks.  First dose August 29, 2021. ? ?INTERVAL HISTORY: ?Lanasia Porras 56 y.o. female returns to the clinic today for follow-up visit accompanied by family member.  The patient is feeling fine today with no concerning complaints except for mild cough and shortness of breath with exertion.  She denied having any chest pain or hemoptysis.  She denied having any nausea, vomiting, diarrhea or constipation.  She has no headache or visual changes.  The patient has no significant weight loss or night sweats.  She denied having any fever or chills.  She had molecular studies by [employer] showed no actionable mutations.  Her PD-L1 expression was 0%.  The patient is here today for evaluation before starting the first cycle of her treatment with chemotherapy in addition to immunotherapy. ? ?MEDICAL HISTORY: ?Past Medical History:  ?Diagnosis Date  ? Anemia   ? Asthma   ? Bronchitis   ? Cancer [facility])   ? basal cell melanoma, cervical cancer  ? COPD (chronic obstructive pulmonary disease) ([facility])    ? "early stages of COPD"  ? Headache   ? Lung cancer ([place]) 08/13/2021  ? ? ?ALLERGIES:  has No Known Allergies. ? ?MEDICATIONS:  ?Current Outpatient Medications  ?Medication Sig Dispense Refill  ? ADVAIR HFA 115-21 MCG/ACT inhaler Inhale 2 puffs into the lungs 2 (two) times daily.    ? folic acid (FOLVITE) 1 MG tablet Take 1 tablet (1 mg total) by mouth daily. 30 tablet 2  ? ibuprofen (ADVIL) 200 MG tablet Take 400 mg by mouth every 8 (eight) hours as needed (for pain).    ? prochlorperazine (COMPAZINE) 10 MG tablet Take 1 tablet (10 mg total) by mouth every 6 (six) hours as needed. 30 tablet 2  ? traMADol (ULTRAM) 50 MG tablet Take by mouth.    ? ?No current facility-administered medications for this visit.  ? ? ?SURGICAL HISTORY:  ?Past Surgical History:  ?Procedure Laterality Date  ? APPENDECTOMY    ? 1996  ? BREAST BIOPSY Right   ? 2010-2015  ? BRONCHIAL BIOPSY  08/13/2021  ? Procedure: BRONCHIAL BIOPSIES;  Surgeon: Collene Gobble, MD;  Location: [facility] ENDOSCOPY;  Service: Pulmonary;;  ? BRONCHIAL BRUSHINGS  08/13/2021  ? Procedure: BRONCHIAL BRUSHINGS;  Surgeon: Collene Gobble, MD;  Location: [facility] ENDOSCOPY;  Service: Pulmonary;;  ? BRONCHIAL NEEDLE ASPIRATION BIOPSY  08/13/2021  ? Procedure: BRONCHIAL NEEDLE ASPIRATION BIOPSIES;  Surgeon: Collene Gobble, MD;  Location: [facility] ENDOSCOPY;  Service: Pulmonary;;  ? CESAREAN SECTION    ? 72, 1987  ? FOOT SURGERY Right   ? 2 pins and screw 1999  ? FOOT  SURGERY Left 06/21/2021  ? screw and 2 pins  ? HEMOSTASIS CONTROL  08/13/2021  ? Procedure: HEMOSTASIS CONTROL;  Surgeon: Collene Gobble, MD;  Location: [facility] ENDOSCOPY;  Service: Pulmonary;;  ? MELANOMA EXCISION Left   ? basal cell melanoma 2011  ? PARTIAL HYSTERECTOMY    ? 1994  ? TUBAL LIGATION    ? 1987  ? VIDEO BRONCHOSCOPY  08/13/2021  ? Procedure: VIDEO BRONCHOSCOPY WITHOUT FLUORO;  Surgeon: Collene Gobble, MD;  Location: [facility] ENDOSCOPY;  Service: Pulmonary;;  ? VIDEO BRONCHOSCOPY WITH ENDOBRONCHIAL ULTRASOUND N/A 08/13/2021  ?  Procedure: VIDEO BRONCHOSCOPY WITH ENDOBRONCHIAL ULTRASOUND;  Surgeon: Collene Gobble, MD;  Location: [facility] ENDOSCOPY;  Service: Pulmonary;  Laterality: N/A;  ? ? ?REVIEW OF SYSTEMS:  Constitutional: positive for fatigue ?Eyes: negative ?Ears, nose, mouth, throat, and face: negative ?Respiratory: positive for cough ?Cardiovascular: negative ?Gastrointestinal: negative ?Genitourinary:negative ?Integument/breast: negative ?Hematologic/lymphatic: negative ?Musculoskeletal:negative ?Neurological: negative ?Behavioral/Psych: negative ?Endocrine: negative ?Allergic/Immunologic: negative  ? ?PHYSICAL EXAMINATION: General appearance: alert, cooperative, fatigued, and no distress ?Head: Normocephalic, without obvious abnormality, atraumatic ?Neck: no adenopathy, no JVD, supple, symmetrical, trachea midline, and thyroid not enlarged, symmetric, no tenderness/mass/nodules ?Lymph nodes: Cervical, supraclavicular, and axillary nodes normal. ?Resp: wheezes bilaterally ?Back: symmetric, no curvature. ROM normal. No CVA tenderness. ?Cardio: regular rate and rhythm, S1, S2 normal, no murmur, click, rub or gallop ?GI: soft, non-tender; bowel sounds normal; no masses,  no organomegaly ?Extremities: extremities normal, atraumatic, no cyanosis or edema ?Neurologic: Alert and oriented X 3, normal strength and tone. Normal symmetric reflexes. Normal coordination and gait ? ?ECOG PERFORMANCE STATUS: 1 - Symptomatic but completely ambulatory ? ?Blood pressure 117/78, pulse (!) 104, temperature 97.6 ?F (36.4 ?C), temperature source Oral, resp. rate 18, height 5' (1.524 m), weight 109 lb 1.6 oz (49.5 kg), SpO2 99 %. ? ?LABORATORY DATA: ?Lab Results  ?Component Value Date  ? WBC 9.7 08/21/2021  ? HGB 10.4 (L) 08/21/2021  ? HCT 32.0 (L) 08/21/2021  ? MCV 95.0 08/21/2021  ? PLT 330 08/21/2021  ? ? ?  Chemistry   ?   ?Component Value Date/Time  ? NA 136 08/21/2021 0916  ? K 4.3 08/21/2021 0916  ? CL 106 08/21/2021 0916  ? CO2 25 08/21/2021 0916  ?  BUN 19 08/21/2021 0916  ? CREATININE 0.45 08/21/2021 0916  ?    ?Component Value Date/Time  ? CALCIUM 9.1 08/21/2021 0916  ? ALKPHOS 89 08/21/2021 0916  ? AST 9 (L) 08/21/2021 0916  ? ALT 6 08/21/2021 0916  ? BILITOT 0.3 08/21/2021 0916  ?  ? ? ? ?RADIOGRAPHIC STUDIES: ?MR Brain W Wo Contrast ? ?Result Date: 08/21/2021 ?CLINICAL DATA:  Provided history: Non-small cell lung cancer, staging. Malignant neoplasm of unspecified part of unspecified bronchus or lung. EXAM: MRI HEAD WITHOUT AND WITH CONTRAST TECHNIQUE: Multiplanar, multiecho pulse sequences of the brain and surrounding structures were obtained without and with intravenous contrast. CONTRAST:  48m GADAVIST GADOBUTROL 1 MMOL/ML IV SOLN COMPARISON:  PET-CT 08/17/2021 FINDINGS: Brain: No age advanced or lobar predominant parenchymal atrophy. Mild multifocal T2 FLAIR hyperintense signal abnormality within the cerebral white matter, nonspecific but most often secondary to chronic small vessel ischemia. There is no acute infarct. No evidence of an intracranial mass. No chronic intracranial blood products. No extra-axial fluid collection. No midline shift. No pathologic intracranial enhancement identified. Vascular: Maintained flow voids within the proximal large arterial vessels. Skull and upper cervical spine: No focal suspicious marrow lesion. Incompletely assessed cervical spondylosis. Sinuses/Orbits: Visualized orbits show  no acute finding. No significant paranasal sinus disease. Other: Small-volume fluid within the bilateral mastoid air cells. IMPRESSION: No evidence of intracranial metastatic disease. Mild multifocal T2 FLAIR hyperintense signal abnormality within the cerebral white matter, nonspecific but most often secondary to chronic small vessel ischemia. Small-volume fluid within the bilateral mastoid air cells Electronically Signed   By: Kellie Simmering D.O.   On: 08/21/2021 07:48  ? ?NM PET Image Initial (PI) Skull Base To Thigh ? ?Result Date:  08/17/2021 ?CLINICAL DATA:  Initial treatment strategy for non-small cell lung cancer staging. EXAM: NUCLEAR MEDICINE PET SKULL BASE TO THIGH TECHNIQUE: 5.33 mCi F-18 FDG was injected intravenously. Full-ring PE

## 2021-08-30 ENCOUNTER — Other Ambulatory Visit: Payer: Self-pay

## 2021-08-30 ENCOUNTER — Ambulatory Visit
Admission: RE | Admit: 2021-08-30 | Discharge: 2021-08-30 | Disposition: A | Discharge status: Home / Self Care | Payer: BC Managed Care – PPO | Source: Ambulatory Visit | Attending: Radiation Oncology | Admitting: Radiation Oncology

## 2021-08-30 ENCOUNTER — Telehealth: Payer: Self-pay

## 2021-08-30 DIAGNOSIS — Z5111 Encounter for antineoplastic chemotherapy: Secondary | ICD-10-CM | POA: Diagnosis not present

## 2021-08-30 LAB — RAD ONC ARIA SESSION SUMMARY
Course Elapsed Days: 8
Plan Fractions Treated to Date: 7
Plan Prescribed Dose Per Fraction: 3 Gy
Plan Total Fractions Prescribed: 10
Plan Total Prescribed Dose: 30 Gy
Reference Point Dosage Given to Date: 21 Gy
Reference Point Session Dosage Given: 3 Gy
Session Number: 7

## 2021-08-30 NOTE — Telephone Encounter (Signed)
April Walsh states that she is doing fine. She is eating, drinking, and urinating well. ?She knows to call the office at [phone] if she has any questions or concerns. ?

## 2021-08-30 NOTE — Telephone Encounter (Signed)
-----   Message from Daphane Shepherd, RN sent at 08/29/2021 12:55 PM EDT ----- ?Regarding: First time [place], Keytruda, Alimta ?Pt of Dr Julien Nordmann, first time [place] and alimta, tolerated well no complaints at discharge ? ?

## 2021-08-31 ENCOUNTER — Other Ambulatory Visit: Payer: Self-pay

## 2021-08-31 ENCOUNTER — Ambulatory Visit
Admission: RE | Admit: 2021-08-31 | Discharge: 2021-08-31 | Disposition: A | Discharge status: Home / Self Care | Payer: BC Managed Care – PPO | Source: Ambulatory Visit | Attending: Radiation Oncology | Admitting: Radiation Oncology

## 2021-08-31 DIAGNOSIS — Z5111 Encounter for antineoplastic chemotherapy: Secondary | ICD-10-CM | POA: Diagnosis not present

## 2021-08-31 LAB — RAD ONC ARIA SESSION SUMMARY
Course Elapsed Days: 9
Plan Fractions Treated to Date: 8
Plan Prescribed Dose Per Fraction: 3 Gy
Plan Total Fractions Prescribed: 10
Plan Total Prescribed Dose: 30 Gy
Reference Point Dosage Given to Date: 24 Gy
Reference Point Session Dosage Given: 3 Gy
Session Number: 8

## 2021-09-03 ENCOUNTER — Ambulatory Visit
Admission: RE | Admit: 2021-09-03 | Discharge: 2021-09-03 | Disposition: A | Discharge status: Home / Self Care | Payer: BC Managed Care – PPO | Source: Ambulatory Visit | Attending: Radiation Oncology | Admitting: Radiation Oncology

## 2021-09-03 ENCOUNTER — Other Ambulatory Visit: Payer: Self-pay

## 2021-09-03 DIAGNOSIS — Z5111 Encounter for antineoplastic chemotherapy: Secondary | ICD-10-CM | POA: Diagnosis not present

## 2021-09-03 LAB — RAD ONC ARIA SESSION SUMMARY
Course Elapsed Days: 12
Plan Fractions Treated to Date: 9
Plan Prescribed Dose Per Fraction: 3 Gy
Plan Total Fractions Prescribed: 10
Plan Total Prescribed Dose: 30 Gy
Reference Point Dosage Given to Date: 27 Gy
Reference Point Session Dosage Given: 3 Gy
Session Number: 9

## 2021-09-04 ENCOUNTER — Other Ambulatory Visit: Payer: Self-pay

## 2021-09-04 ENCOUNTER — Ambulatory Visit
Admission: RE | Admit: 2021-09-04 | Discharge: 2021-09-04 | Disposition: A | Discharge status: Home / Self Care | Payer: BC Managed Care – PPO | Source: Ambulatory Visit | Attending: Radiation Oncology | Admitting: Radiation Oncology

## 2021-09-04 ENCOUNTER — Encounter: Payer: Self-pay | Admitting: Radiation Oncology

## 2021-09-04 ENCOUNTER — Inpatient Hospital Stay: Payer: BC Managed Care – PPO

## 2021-09-04 DIAGNOSIS — Z5111 Encounter for antineoplastic chemotherapy: Secondary | ICD-10-CM | POA: Diagnosis not present

## 2021-09-04 DIAGNOSIS — C3401 Malignant neoplasm of right main bronchus: Secondary | ICD-10-CM

## 2021-09-04 LAB — CBC WITH DIFFERENTIAL (CANCER CENTER ONLY)
Abs Immature Granulocytes: 0.02 10*3/uL (ref 0.00–0.07)
Basophils Absolute: 0 10*3/uL (ref 0.0–0.1)
Basophils Relative: 1 %
Eosinophils Absolute: 0.2 10*3/uL (ref 0.0–0.5)
Eosinophils Relative: 4 %
HCT: 31.7 % — ABNORMAL LOW (ref 36.0–46.0)
Hemoglobin: 10.5 g/dL — ABNORMAL LOW (ref 12.0–15.0)
Immature Granulocytes: 1 %
Lymphocytes Relative: 21 %
Lymphs Abs: 0.9 10*3/uL (ref 0.7–4.0)
MCH: 31 pg (ref 26.0–34.0)
MCHC: 33.1 g/dL (ref 30.0–36.0)
MCV: 93.5 fL (ref 80.0–100.0)
Monocytes Absolute: 0.5 10*3/uL (ref 0.1–1.0)
Monocytes Relative: 11 %
Neutro Abs: 2.7 10*3/uL (ref 1.7–7.7)
Neutrophils Relative %: 62 %
Platelet Count: 149 10*3/uL — ABNORMAL LOW (ref 150–400)
RBC: 3.39 MIL/uL — ABNORMAL LOW (ref 3.87–5.11)
RDW: 14.6 % (ref 11.5–15.5)
WBC Count: 4.3 10*3/uL (ref 4.0–10.5)
nRBC: 0 % (ref 0.0–0.2)

## 2021-09-04 LAB — CMP (CANCER CENTER ONLY)
ALT: 18 U/L (ref 0–44)
AST: 22 U/L (ref 15–41)
Albumin: 3.6 g/dL (ref 3.5–5.0)
Alkaline Phosphatase: 82 U/L (ref 38–126)
Anion gap: 9 (ref 5–15)
BUN: 19 mg/dL (ref 6–20)
CO2: 25 mmol/L (ref 22–32)
Calcium: 9.1 mg/dL (ref 8.9–10.3)
Chloride: 102 mmol/L (ref 98–111)
Creatinine: 0.47 mg/dL (ref 0.44–1.00)
GFR, Estimated: >60 mL/min (ref >60)
Glucose, Bld: 102 mg/dL — ABNORMAL HIGH (ref 70–99)
Potassium: 3.8 mmol/L (ref 3.5–5.1)
Sodium: 136 mmol/L (ref 135–145)
Total Bilirubin: 0.3 mg/dL (ref 0.3–1.2)
Total Protein: 6.8 g/dL (ref 6.5–8.1)

## 2021-09-04 LAB — RAD ONC ARIA SESSION SUMMARY
Course Elapsed Days: 13
Plan Fractions Treated to Date: 10
Plan Prescribed Dose Per Fraction: 3 Gy
Plan Total Fractions Prescribed: 10
Plan Total Prescribed Dose: 30 Gy
Reference Point Dosage Given to Date: 30 Gy
Reference Point Session Dosage Given: 3 Gy
Session Number: 10

## 2021-09-07 ENCOUNTER — Encounter: Payer: Self-pay | Admitting: Genetic Counselor

## 2021-09-07 ENCOUNTER — Telehealth: Payer: Self-pay | Admitting: Genetic Counselor

## 2021-09-07 DIAGNOSIS — Z1379 Encounter for other screening for genetic and chromosomal anomalies: Secondary | ICD-10-CM | POA: Insufficient documentation

## 2021-09-07 NOTE — Telephone Encounter (Signed)
I contacted Ms. Kiernan to discuss her genetic testing results. No pathogenic variants were identified in the 77 genes analyzed. Detailed clinic note to follow. ? ?The test report has been scanned into EPIC and is located under the Molecular Pathology section of the Results Review tab.  A portion of the result report is included below for reference.  ? ?Lucille Passy, MS, [place] ?Genetic Counselor ?[email or web address] ?(P) [phone] ? ? ?

## 2021-09-10 ENCOUNTER — Ambulatory Visit: Payer: Self-pay | Admitting: Genetic Counselor

## 2021-09-10 DIAGNOSIS — Z1379 Encounter for other screening for genetic and chromosomal anomalies: Secondary | ICD-10-CM

## 2021-09-10 NOTE — Progress Notes (Signed)
HPI:   ?April Walsh was previously seen in the [facility] clinic due to a personal and family history of cancer and concerns regarding a hereditary predisposition to cancer. Please refer to our prior cancer genetics clinic note for more information regarding our discussion, assessment and recommendations, at the time. April Walsh recent genetic test results were disclosed to her, as were recommendations warranted by these results. These results and recommendations are discussed in more detail below. ? ?CANCER HISTORY:  ?Oncology History  ?Adenocarcinoma of right lung, stage 4 ([place])  ?08/21/2021 Initial Diagnosis  ? Adenocarcinoma of right lung, stage 4 ([place]) ? ?  ?08/21/2021 Cancer Staging  ? Staging form: Lung, AJCC 8th Edition ?- Clinical: Stage IVB (cT2b, cN2, cM1c) - Signed by Curt Bears, MD on 08/21/2021 ? ?  ?08/29/2021 -  Chemotherapy  ? Patient is on Treatment Plan : LUNG Carboplatin (5) + Pemetrexed (500) + Pembrolizumab (200) D1 q21d Induction x 4 cycles / Maintenance Pemetrexed (500) + Pembrolizumab (200) D1 q21d  ? ?  ?  ? Genetic Testing  ? [facility] CancerNext-Expanded Panel was Negative. Report date is 09/04/2021. ? ?The CancerNext-Expanded gene panel offered by [facility] and includes sequencing, rearrangement, and RNA analysis for the following 77 genes: AIP, ALK, APC, ATM, AXIN2, BAP1, BARD1, BLM, BMPR1A, BRCA1, BRCA2, BRIP1, CDC73, CDH1, CDK4, CDKN1B, CDKN2A, CHEK2, CTNNA1, DICER1, FANCC, FH, FLCN, GALNT12, KIF1B, LZTR1, MAX, MEN1, MET, MLH1, MSH2, MSH3, MSH6, MUTYH, NBN, NF1, NF2, NTHL1, PALB2, PHOX2B, PMS2, POT1, PRKAR1A, PTCH1, PTEN, RAD51C, RAD51D, RB1, RECQL, RET, SDHA, SDHAF2, SDHB, SDHC, SDHD, SMAD4, SMARCA4, SMARCB1, SMARCE1, STK11, SUFU, TMEM127, TP53, TSC1, TSC2, VHL and XRCC2 (sequencing and deletion/duplication); EGFR, EGLN1, HOXB13, KIT, MITF, PDGFRA, POLD1, and POLE (sequencing only); EPCAM and GREM1 (deletion/duplication only).  ?  ? ? ?FAMILY HISTORY:  ?We  obtained a detailed, 4-generation family history.  Significant diagnoses are listed below: ?Family History  ?Problem Relation Age of Onset  ? Brain cancer Maternal Aunt   ? Throat cancer Maternal Aunt   ? Lung cancer Paternal Uncle   ?     he smoked  ? Lung cancer Paternal Uncle   ?     he smoked  ? Ovarian cancer Maternal Grandmother   ? Lung cancer Cousin   ? ? ?    ?  ?  ?  ? ?April Walsh has three full maternal aunts. One of her maternal aunts was diagnosed with brain cancer at an unknown age and a second maternal aunt was diagnosed with throat cancer at an unknown age, she smoked. Her maternal grandmother was diagnosed with ovarian cancer and died at age 63. Of note, her maternal aunt reports that it may have been cervical cancer.  ?  ?April Walsh's paternal uncle was diagnosed with lung cancer, he smoked. She has a paternal half-uncle who was diagnosed with lung cancer, he smoked. One paternal half-aunt was diagnosed with breast cancer at an unknown age. She also has one paternal cousin who possibly had lung cancer.  ?  ?April Walsh is unaware of previous family history of genetic testing for hereditary cancer risks. There is no reported Ashkenazi Jewish ancestry.  ?  ? ?GENETIC TEST RESULTS:  ?The [facility] CancerNext-Expanded Panel found no pathogenic mutations. ? ?The CancerNext-Expanded gene panel offered by [facility] and includes sequencing, rearrangement, and RNA analysis for the following 77 genes: AIP, ALK, APC, ATM, AXIN2, BAP1, BARD1, BLM, BMPR1A, BRCA1, BRCA2, BRIP1, CDC73, CDH1, CDK4, CDKN1B, CDKN2A, CHEK2, CTNNA1, DICER1, FANCC,  FH, FLCN, GALNT12, KIF1B, LZTR1, MAX, MEN1, MET, MLH1, MSH2, MSH3, MSH6, MUTYH, NBN, NF1, NF2, NTHL1, PALB2, PHOX2B, PMS2, POT1, PRKAR1A, PTCH1, PTEN, RAD51C, RAD51D, RB1, RECQL, RET, SDHA, SDHAF2, SDHB, SDHC, SDHD, SMAD4, SMARCA4, SMARCB1, SMARCE1, STK11, SUFU, TMEM127, TP53, TSC1, TSC2, VHL and XRCC2 (sequencing and deletion/duplication); EGFR, EGLN1, HOXB13, KIT, MITF,  PDGFRA, POLD1, and POLE (sequencing only); EPCAM and GREM1 (deletion/duplication only).  ? ?The test report has been scanned into EPIC and is located under the Molecular Pathology section of the Results Review tab.  A portion of the result report is included below for reference. Genetic testing reported out on 09/04/2021.  ? ? ? ? ? ?Even though a pathogenic variant was not identified, possible explanations for her personal history of cancer may include: ?There may be no hereditary risk for cancer in the family. The cancers in April Walsh and/or her family may be due to other genetic or environmental factors. ?There may be a gene mutation in one of these genes that current testing methods cannot detect, but that chance is small. ?There could be another gene that has not yet been discovered, or that we have not yet tested, that is responsible for the cancer diagnoses in the family.  ? ?Therefore, it is important to remain in touch with cancer genetics in the future so that we can continue to offer April Walsh the most up to date genetic testing.  ? ?ADDITIONAL GENETIC TESTING:  ?We discussed with April Walsh that her genetic testing was fairly extensive.  If there are genes identified to increase cancer risk that can be analyzed in the future, we would be happy to discuss and coordinate this testing at that time.   ? ?CANCER SCREENING RECOMMENDATIONS:  ?[name]'s test result is considered negative (normal).  This means that we have not identified a hereditary cause for her personal and family history of cancer at this time. Most cancers happen by chance and this negative test suggests that her cancer may fall into this category.   ? ?An individual's cancer risk and medical management are not determined by genetic test results alone. Overall cancer risk assessment incorporates additional factors, including personal medical history, family history, and any available genetic information that may result in a  personalized plan for cancer prevention and surveillance. Therefore, it is recommended she continue to follow the cancer management and screening guidelines provided by her oncology and primary healthcare provider. ? ?RECOMMENDATIONS FOR FAMILY MEMBERS:   ?Since she did not inherit a mutation in a cancer predisposition gene included on this panel, her children could not have inherited a mutation from her in one of these genes. ? ?FOLLOW-UP:  ?Cancer genetics is a rapidly advancing field and it is possible that new genetic tests will be appropriate for her and/or her family members in the future. We encouraged her to remain in contact with cancer genetics on an annual basis so we can update her personal and family histories and let her know of advances in cancer genetics that may benefit this family.  ? ?Our contact number was provided. April Walsh questions were answered to her satisfaction, and she knows she is welcome to call us at anytime with additional questions or concerns.  ? ?Lucille Passy, MS, [place] ?Genetic Counselor ?[email or web address] ?(P) [phone] ? ? ?

## 2021-09-11 ENCOUNTER — Other Ambulatory Visit: Payer: Self-pay

## 2021-09-11 ENCOUNTER — Observation Stay (HOSPITAL_COMMUNITY)
Admission: EM | Admit: 2021-09-11 | Discharge: 2021-09-12 | Disposition: A | Discharge status: Home / Self Care | Payer: BC Managed Care – PPO | Attending: Family Medicine | Admitting: Family Medicine

## 2021-09-11 ENCOUNTER — Encounter (HOSPITAL_COMMUNITY): Payer: Self-pay | Admitting: Emergency Medicine

## 2021-09-11 ENCOUNTER — Emergency Department (HOSPITAL_COMMUNITY): Payer: BC Managed Care – PPO

## 2021-09-11 DIAGNOSIS — Z79899 Other long term (current) drug therapy: Secondary | ICD-10-CM | POA: Diagnosis not present

## 2021-09-11 DIAGNOSIS — J449 Chronic obstructive pulmonary disease, unspecified: Secondary | ICD-10-CM | POA: Diagnosis not present

## 2021-09-11 DIAGNOSIS — Z85828 Personal history of other malignant neoplasm of skin: Secondary | ICD-10-CM | POA: Diagnosis not present

## 2021-09-11 DIAGNOSIS — Z7901 Long term (current) use of anticoagulants: Secondary | ICD-10-CM | POA: Diagnosis not present

## 2021-09-11 DIAGNOSIS — E876 Hypokalemia: Secondary | ICD-10-CM | POA: Diagnosis not present

## 2021-09-11 DIAGNOSIS — R531 Weakness: Secondary | ICD-10-CM | POA: Diagnosis not present

## 2021-09-11 DIAGNOSIS — C3491 Malignant neoplasm of unspecified part of right bronchus or lung: Secondary | ICD-10-CM | POA: Diagnosis present

## 2021-09-11 DIAGNOSIS — I639 Cerebral infarction, unspecified: Secondary | ICD-10-CM | POA: Diagnosis present

## 2021-09-11 DIAGNOSIS — I6601 Occlusion and stenosis of right middle cerebral artery: Secondary | ICD-10-CM | POA: Diagnosis not present

## 2021-09-11 DIAGNOSIS — J45909 Unspecified asthma, uncomplicated: Secondary | ICD-10-CM | POA: Diagnosis not present

## 2021-09-11 DIAGNOSIS — J441 Chronic obstructive pulmonary disease with (acute) exacerbation: Secondary | ICD-10-CM | POA: Diagnosis present

## 2021-09-11 DIAGNOSIS — I63511 Cerebral infarction due to unspecified occlusion or stenosis of right middle cerebral artery: Principal | ICD-10-CM | POA: Insufficient documentation

## 2021-09-11 DIAGNOSIS — R000 Tachycardia, unspecified: Secondary | ICD-10-CM | POA: Diagnosis not present

## 2021-09-11 DIAGNOSIS — U071 COVID-19: Secondary | ICD-10-CM | POA: Insufficient documentation

## 2021-09-11 DIAGNOSIS — I671 Cerebral aneurysm, nonruptured: Secondary | ICD-10-CM | POA: Diagnosis not present

## 2021-09-11 DIAGNOSIS — R1310 Dysphagia, unspecified: Secondary | ICD-10-CM

## 2021-09-11 DIAGNOSIS — E441 Mild protein-calorie malnutrition: Secondary | ICD-10-CM | POA: Diagnosis not present

## 2021-09-11 DIAGNOSIS — F17210 Nicotine dependence, cigarettes, uncomplicated: Secondary | ICD-10-CM | POA: Diagnosis not present

## 2021-09-11 DIAGNOSIS — R21 Rash and other nonspecific skin eruption: Secondary | ICD-10-CM | POA: Diagnosis not present

## 2021-09-11 DIAGNOSIS — E46 Unspecified protein-calorie malnutrition: Secondary | ICD-10-CM

## 2021-09-11 DIAGNOSIS — S2241XA Multiple fractures of ribs, right side, initial encounter for closed fracture: Secondary | ICD-10-CM | POA: Diagnosis not present

## 2021-09-11 DIAGNOSIS — R0602 Shortness of breath: Secondary | ICD-10-CM | POA: Diagnosis not present

## 2021-09-11 DIAGNOSIS — Z85118 Personal history of other malignant neoplasm of bronchus and lung: Secondary | ICD-10-CM | POA: Diagnosis not present

## 2021-09-11 LAB — CBC WITH DIFFERENTIAL/PLATELET
Abs Immature Granulocytes: 0.04 10*3/uL (ref 0.00–0.07)
Basophils Absolute: 0 10*3/uL (ref 0.0–0.1)
Basophils Relative: 0 %
Eosinophils Absolute: 0.2 10*3/uL (ref 0.0–0.5)
Eosinophils Relative: 2 %
HCT: 31.3 % — ABNORMAL LOW (ref 36.0–46.0)
Hemoglobin: 10.5 g/dL — ABNORMAL LOW (ref 12.0–15.0)
Immature Granulocytes: 1 %
Lymphocytes Relative: 13 %
Lymphs Abs: 1.1 10*3/uL (ref 0.7–4.0)
MCH: 32.1 pg (ref 26.0–34.0)
MCHC: 33.5 g/dL (ref 30.0–36.0)
MCV: 95.7 fL (ref 80.0–100.0)
Monocytes Absolute: 1.8 10*3/uL — ABNORMAL HIGH (ref 0.1–1.0)
Monocytes Relative: 21 %
Neutro Abs: 5.5 10*3/uL (ref 1.7–7.7)
Neutrophils Relative %: 63 %
Platelets: 149 10*3/uL — ABNORMAL LOW (ref 150–400)
RBC: 3.27 MIL/uL — ABNORMAL LOW (ref 3.87–5.11)
RDW: 15.8 % — ABNORMAL HIGH (ref 11.5–15.5)
WBC: 8.7 10*3/uL (ref 4.0–10.5)
nRBC: 0 % (ref 0.0–0.2)

## 2021-09-11 LAB — RAPID URINE DRUG SCREEN, HOSP PERFORMED
Amphetamines: NOT DETECTED
Barbiturates: NOT DETECTED
Benzodiazepines: NOT DETECTED
Cocaine: NOT DETECTED
Opiates: NOT DETECTED
Tetrahydrocannabinol: POSITIVE — AB

## 2021-09-11 LAB — COMPREHENSIVE METABOLIC PANEL
ALT: 20 U/L (ref 0–44)
AST: 21 U/L (ref 15–41)
Albumin: 3.4 g/dL — ABNORMAL LOW (ref 3.5–5.0)
Alkaline Phosphatase: 95 U/L (ref 38–126)
Anion gap: 10 (ref 5–15)
BUN: 15 mg/dL (ref 6–20)
CO2: 21 mmol/L — ABNORMAL LOW (ref 22–32)
Calcium: 9 mg/dL (ref 8.9–10.3)
Chloride: 103 mmol/L (ref 98–111)
Creatinine, Ser: 0.47 mg/dL (ref 0.44–1.00)
GFR, Estimated: >60 mL/min (ref >60)
Glucose, Bld: 89 mg/dL (ref 70–99)
Potassium: 3.4 mmol/L — ABNORMAL LOW (ref 3.5–5.1)
Sodium: 134 mmol/L — ABNORMAL LOW (ref 135–145)
Total Bilirubin: 0.6 mg/dL (ref 0.3–1.2)
Total Protein: 7 g/dL (ref 6.5–8.1)

## 2021-09-11 LAB — URINALYSIS, ROUTINE W REFLEX MICROSCOPIC
Bilirubin Urine: NEGATIVE
Glucose, UA: NEGATIVE mg/dL
Hgb urine dipstick: NEGATIVE
Ketones, ur: 80 mg/dL — AB
Leukocytes,Ua: NEGATIVE
Nitrite: NEGATIVE
Protein, ur: NEGATIVE mg/dL
Specific Gravity, Urine: 1.026 (ref 1.005–1.030)
pH: 5 (ref 5.0–8.0)

## 2021-09-11 LAB — RESP PANEL BY RT-PCR (FLU A&B, COVID) ARPGX2
Influenza A by PCR: NEGATIVE
Influenza B by PCR: NEGATIVE
SARS Coronavirus 2 by RT PCR: POSITIVE — AB

## 2021-09-11 LAB — APTT: aPTT: 29 seconds (ref 24–36)

## 2021-09-11 LAB — PROTIME-INR
INR: 1 (ref 0.8–1.2)
Prothrombin Time: 12.7 seconds (ref 11.4–15.2)

## 2021-09-11 LAB — ETHANOL: Alcohol, Ethyl (B): <10 mg/dL (ref <10)

## 2021-09-11 MED ORDER — ASPIRIN 325 MG PO TABS
325.0000 mg | ORAL_TABLET | Freq: Once | ORAL | Status: AC
Start: 2021-09-11 — End: 2021-09-11
  Administered 2021-09-11: 325 mg via ORAL
  Filled 2021-09-11: qty 1

## 2021-09-11 MED ORDER — STROKE: EARLY STAGES OF RECOVERY BOOK
Freq: Once | Status: DC
  Filled 2021-09-11: qty 1

## 2021-09-11 MED ORDER — MOMETASONE FURO-FORMOTEROL FUM 200-5 MCG/ACT IN AERO
2.0000 | INHALATION_SPRAY | Freq: Two times a day (BID) | RESPIRATORY_TRACT | Status: DC
  Administered 2021-09-12: 2 via RESPIRATORY_TRACT
  Filled 2021-09-11: qty 8.8

## 2021-09-11 MED ORDER — ACETAMINOPHEN 650 MG RE SUPP: 650.0000 mg | RECTAL | Status: DC | PRN

## 2021-09-11 MED ORDER — SENNOSIDES-DOCUSATE SODIUM 8.6-50 MG PO TABS: 1.0000 | ORAL_TABLET | Freq: Every evening | ORAL | Status: DC | PRN

## 2021-09-11 MED ORDER — IOHEXOL 350 MG/ML SOLN
75.0000 mL | Freq: Once | INTRAVENOUS | Status: AC | PRN
  Administered 2021-09-11: 75 mL via INTRAVENOUS

## 2021-09-11 MED ORDER — IOHEXOL 350 MG/ML SOLN
100.0000 mL | Freq: Once | INTRAVENOUS | Status: AC | PRN
  Administered 2021-09-11: 100 mL via INTRAVENOUS

## 2021-09-11 MED ORDER — CLOPIDOGREL BISULFATE 75 MG PO TABS
300.0000 mg | ORAL_TABLET | Freq: Once | ORAL | Status: AC
Start: 2021-09-11 — End: 2021-09-11
  Administered 2021-09-11: 300 mg via ORAL
  Filled 2021-09-11: qty 4

## 2021-09-11 MED ORDER — LACTATED RINGERS IV BOLUS
1000.0000 mL | Freq: Once | INTRAVENOUS | Status: AC
  Administered 2021-09-11: 1000 mL via INTRAVENOUS

## 2021-09-11 MED ORDER — ACETAMINOPHEN 325 MG PO TABS: 650.0000 mg | ORAL_TABLET | ORAL | Status: DC | PRN

## 2021-09-11 MED ORDER — FOLIC ACID 1 MG PO TABS
1.0000 mg | ORAL_TABLET | Freq: Every day | ORAL | Status: DC
  Administered 2021-09-12: 1 mg via ORAL
  Filled 2021-09-11: qty 1

## 2021-09-11 MED ORDER — ACETAMINOPHEN 160 MG/5ML PO SOLN: 650.0000 mg | ORAL | Status: DC | PRN

## 2021-09-11 MED ORDER — HEPARIN SODIUM (PORCINE) 5000 UNIT/ML IJ SOLN
5000.0000 [IU] | Freq: Three times a day (TID) | INTRAMUSCULAR | Status: DC
  Administered 2021-09-12 (×2): 5000 [IU] via SUBCUTANEOUS
  Filled 2021-09-11 (×2): qty 1

## 2021-09-11 NOTE — ED Provider Notes (Signed)
?[facility] ?Provider Note ? ? ?CSN: [id number] ?Arrival date & time: 09/11/21  1839 ? ?  ? ?History ? ?Chief Complaint  ?Patient presents with  ? Weakness  ? ? ?April Walsh is a 56 y.o. female. ? ? ?Weakness ?Patient presents for difficulty with speech and left-sided weakness.  This was noticed when she woke up this morning at 8:30 AM.  Prior to that, she was in her normal state of health at 7 PM, when she went to sleep.  She states that she did not wake up throughout the night.  Last known normal was 7 PM yesterday.  She does not take any current anticoagulation or antiplatelet medications.  Her lung cancer has had metastatic spread but most recent brain imaging did not show any brain metastases.  In addition to her lung cancer, medical history includes COPD.  Her oncologist is Dr. Julien Nordmann.  Last office visit was 2 weeks ago. ?  ? ?Home Medications ?Prior to Admission medications   ?Medication Sig Start Date End Date Taking? Authorizing Provider  ?ADVAIR HFA 115-21 MCG/ACT inhaler Inhale 2 puffs into the lungs 2 (two) times daily. 07/12/21  Yes [provider]  ?folic acid (FOLVITE) 1 MG tablet Take 1 tablet (1 mg total) by mouth daily. 08/21/21  Yes Heilingoetter, Cassandra L, PA-C  ?prochlorperazine (COMPAZINE) 10 MG tablet Take 1 tablet (10 mg total) by mouth every 6 (six) hours as needed. 08/21/21  Yes Heilingoetter, Cassandra L, PA-C  ?apixaban (ELIQUIS) 2.5 MG TABS tablet Take 1 tablet (2.5 mg total) by mouth 2 (two) times daily. 09/12/21   Patrecia Pour, MD  ?atorvastatin (LIPITOR) 40 MG tablet Take 1 tablet (40 mg total) by mouth daily. 09/13/21   Patrecia Pour, MD  ?molnupiravir EUA (LAGEVRIO) 200 mg CAPS capsule Take 4 capsules (800 mg total) by mouth 2 (two) times daily for 5 days. 09/12/21 09/17/21  Patrecia Pour, MD  ?   ? ?Allergies    ?Patient has no known allergies.   ? ?Review of Systems   ?Review of Systems  ?Neurological:  Positive for speech difficulty and weakness.  ?All  other systems reviewed and are negative. ? ?Physical Exam ?Updated Vital Signs ?BP 113/76   Pulse (!) 119   Temp 97.9 ?F (36.6 ?C) (Oral)   Resp (!) 23   Ht 5' (1.524 m)   SpO2 95%   BMI 21.31 kg/m?  ?Physical Exam ?Vitals and nursing note reviewed.  ?Constitutional:   ?   General: She is not in acute distress. ?   Appearance: Normal appearance. She is well-developed and normal weight. She is not ill-appearing, toxic-appearing or diaphoretic.  ?HENT:  ?   Head: Normocephalic and atraumatic.  ?   Right Ear: External ear normal.  ?   Left Ear: External ear normal.  ?   Nose: Nose normal.  ?   Mouth/Throat:  ?   Mouth: Mucous membranes are moist.  ?   Pharynx: Oropharynx is clear.  ?Eyes:  ?   General: No visual field deficit. ?   Extraocular Movements: Extraocular movements intact.  ?   Conjunctiva/sclera: Conjunctivae normal.  ?Cardiovascular:  ?   Rate and Rhythm: Normal rate and regular rhythm.  ?   Heart sounds: No murmur heard. ?Pulmonary:  ?   Effort: Pulmonary effort is normal. No respiratory distress.  ?Abdominal:  ?   General: There is no distension.  ?   Palpations: Abdomen is soft.  ?Musculoskeletal:     ?  General: No swelling. Normal range of motion.  ?   Cervical back: Normal range of motion and neck supple.  ?   Right lower leg: No edema.  ?   Left lower leg: No edema.  ?Skin: ?   General: Skin is warm and dry.  ?   Capillary Refill: Capillary refill takes less than 2 seconds.  ?   Coloration: Skin is not jaundiced or pale.  ?Neurological:  ?   Mental Status: She is alert and oriented to person, place, and time.  ?   Cranial Nerves: Dysarthria present. No cranial nerve deficit or facial asymmetry.  ?   Sensory: Sensation is intact. No sensory deficit.  ?   Motor: Weakness (3/5 strength in left upper extremity, 4/5 strength in left lower extremity, normal strength in right hemibody) and pronator drift present. No abnormal muscle tone.  ?   Coordination: Coordination is intact. Finger-Nose-Finger  Test normal.  ?Psychiatric:     ?   Mood and Affect: Mood normal.     ?   Behavior: Behavior normal.  ? ? ?ED Results / Procedures / Treatments   ?Labs ?(all labs ordered are listed, but only abnormal results are displayed) ?Labs Reviewed  ?RESP PANEL BY RT-PCR (FLU A&B, COVID) ARPGX2 - Abnormal; Notable for the following components:  ?    Result Value  ? SARS Coronavirus 2 by RT PCR POSITIVE (*)   ? All other components within normal limits  ?COMPREHENSIVE METABOLIC PANEL - Abnormal; Notable for the following components:  ? Sodium 134 (*)   ? Potassium 3.4 (*)   ? CO2 21 (*)   ? Albumin 3.4 (*)   ? All other components within normal limits  ?RAPID URINE DRUG SCREEN, HOSP PERFORMED - Abnormal; Notable for the following components:  ? Tetrahydrocannabinol POSITIVE (*)   ? All other components within normal limits  ?URINALYSIS, ROUTINE W REFLEX MICROSCOPIC - Abnormal; Notable for the following components:  ? Ketones, ur 80 (*)   ? All other components within normal limits  ?CBC WITH DIFFERENTIAL/PLATELET - Abnormal; Notable for the following components:  ? RBC 3.27 (*)   ? Hemoglobin 10.5 (*)   ? HCT 31.3 (*)   ? RDW 15.8 (*)   ? Platelets 149 (*)   ? Monocytes Absolute 1.8 (*)   ? All other components within normal limits  ?LIPID PANEL - Abnormal; Notable for the following components:  ? HDL 38 (*)   ? All other components within normal limits  ?HEMOGLOBIN A1C - Abnormal; Notable for the following components:  ? Hgb A1c MFr Bld 4.7 (*)   ? All other components within normal limits  ?COMPREHENSIVE METABOLIC PANEL - Abnormal; Notable for the following components:  ? Creatinine, Ser 0.37 (*)   ? Calcium 8.5 (*)   ? Total Protein 6.1 (*)   ? Albumin 2.9 (*)   ? All other components within normal limits  ?CBC - Abnormal; Notable for the following components:  ? RBC 2.88 (*)   ? Hemoglobin 9.2 (*)   ? HCT 27.5 (*)   ? RDW 15.9 (*)   ? Platelets 131 (*)   ? All other components within normal limits  ?I-STAT CHEM 8, ED -  Abnormal; Notable for the following components:  ? Sodium 134 (*)   ? Creatinine, Ser 0.30 (*)   ? Hemoglobin 11.6 (*)   ? HCT 34.0 (*)   ? All other components within normal limits  ?ETHANOL  ?PROTIME-INR  ?  APTT  ?HIV ANTIBODY (ROUTINE TESTING W REFLEX)  ? ? ?EKG ?EKG Interpretation ? ?Date/Time:  Tuesday Sep 11 2021 22:29:06 EDT ?Ventricular Rate:  124 ?PR Interval:  161 ?QRS Duration: 92 ?QT Interval:  329 ?QTC Calculation: 473 ?R Axis:   95 ?Text Interpretation: Sinus tachycardia LAE, consider biatrial enlargement Consider right ventricular hypertrophy Confirmed by Thamas Jaegers ([id number]) on 09/12/2021 10:22:43 PM ? ?Radiology ?ECHOCARDIOGRAM COMPLETE ? ?Result Date: 09/12/2021 ?   ECHOCARDIOGRAM REPORT   Patient Name:   ROBEN TATSCH Date of Exam: 09/12/2021 Medical Rec #:  [id number]     Height:       60.0 in Accession #:    [id number]    Weight:       109.1 lb Date of Birth:  1966-05-02      BSA:          1.443 m? Patient Age:    81 years      BP:           125/81 mmHg Patient Gender: F             HR:           112 bpm. Exam Location:  [facility] Procedure: 2D Echo, Cardiac Doppler and Color Doppler Indications:    Stroke  History:        Patient has no prior history of Echocardiogram examinations.                 COPD and Stroke; Arrythmias:Tachycardia. Rt Lung CA, COVID +.  Sonographer:    Wenda Low Referring Phys: [id number] ASIA B [place]  1. Left ventricular ejection fraction, by estimation, is 60 to 65%. The left ventricle has normal function. The left ventricle has no regional wall motion abnormalities. Left ventricular diastolic parameters were normal.  2. Right ventricular systolic function is normal. The right ventricular size is normal. Tricuspid regurgitation signal is inadequate for assessing PA pressure.  3. The mitral valve is normal in structure. Trivial mitral valve regurgitation. No evidence of mitral stenosis.  4. The aortic valve was not well visualized. Aortic valve regurgitation  is not visualized. No aortic stenosis is present.  5. The inferior vena cava is normal in size with greater than 50% respiratory variability, suggesting right atrial pressure of 3 mmHg. FINDINGS  Left Ventricle: L

## 2021-09-11 NOTE — ED Triage Notes (Signed)
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 ?

## 2021-09-12 ENCOUNTER — Ambulatory Visit: Payer: BC Managed Care – PPO | Admitting: Emergency Medicine

## 2021-09-12 ENCOUNTER — Observation Stay (HOSPITAL_BASED_OUTPATIENT_CLINIC_OR_DEPARTMENT_OTHER): Payer: BC Managed Care – PPO

## 2021-09-12 ENCOUNTER — Observation Stay (HOSPITAL_COMMUNITY): Payer: BC Managed Care – PPO

## 2021-09-12 DIAGNOSIS — I6389 Other cerebral infarction: Secondary | ICD-10-CM | POA: Diagnosis not present

## 2021-09-12 DIAGNOSIS — E876 Hypokalemia: Secondary | ICD-10-CM | POA: Diagnosis not present

## 2021-09-12 DIAGNOSIS — J449 Chronic obstructive pulmonary disease, unspecified: Secondary | ICD-10-CM

## 2021-09-12 DIAGNOSIS — I63511 Cerebral infarction due to unspecified occlusion or stenosis of right middle cerebral artery: Secondary | ICD-10-CM | POA: Diagnosis not present

## 2021-09-12 DIAGNOSIS — R1310 Dysphagia, unspecified: Secondary | ICD-10-CM | POA: Diagnosis not present

## 2021-09-12 DIAGNOSIS — R531 Weakness: Secondary | ICD-10-CM | POA: Diagnosis not present

## 2021-09-12 DIAGNOSIS — R42 Dizziness and giddiness: Secondary | ICD-10-CM | POA: Diagnosis not present

## 2021-09-12 DIAGNOSIS — I639 Cerebral infarction, unspecified: Secondary | ICD-10-CM | POA: Diagnosis not present

## 2021-09-12 DIAGNOSIS — E441 Mild protein-calorie malnutrition: Secondary | ICD-10-CM

## 2021-09-12 DIAGNOSIS — R21 Rash and other nonspecific skin eruption: Secondary | ICD-10-CM

## 2021-09-12 DIAGNOSIS — R000 Tachycardia, unspecified: Secondary | ICD-10-CM

## 2021-09-12 DIAGNOSIS — E46 Unspecified protein-calorie malnutrition: Secondary | ICD-10-CM

## 2021-09-12 LAB — ECHOCARDIOGRAM COMPLETE
AR max vel: 1.96 cm2
AV Area VTI: 1.89 cm2
AV Area mean vel: 1.9 cm2
AV Mean grad: 7 mmHg
AV Peak grad: 13.4 mmHg
Ao pk vel: 1.83 m/s
Area-P 1/2: 8.07 cm2
Calc EF: 63.4 %
Height: 60 in
MV VTI: 3.04 cm2
S' Lateral: 2.5 cm
Single Plane A2C EF: 66.4 %
Single Plane A4C EF: 60.4 %

## 2021-09-12 LAB — I-STAT CHEM 8, ED
BUN: 15 mg/dL (ref 6–20)
Calcium, Ion: 1.15 mmol/L (ref 1.15–1.40)
Chloride: 102 mmol/L (ref 98–111)
Creatinine, Ser: 0.3 mg/dL — ABNORMAL LOW (ref 0.44–1.00)
Glucose, Bld: 86 mg/dL (ref 70–99)
HCT: 34 % — ABNORMAL LOW (ref 36.0–46.0)
Hemoglobin: 11.6 g/dL — ABNORMAL LOW (ref 12.0–15.0)
Potassium: 3.7 mmol/L (ref 3.5–5.1)
Sodium: 134 mmol/L — ABNORMAL LOW (ref 135–145)
TCO2: 22 mmol/L (ref 22–32)

## 2021-09-12 LAB — COMPREHENSIVE METABOLIC PANEL
ALT: 16 U/L (ref 0–44)
AST: 16 U/L (ref 15–41)
Albumin: 2.9 g/dL — ABNORMAL LOW (ref 3.5–5.0)
Alkaline Phosphatase: 82 U/L (ref 38–126)
Anion gap: 10 (ref 5–15)
BUN: 7 mg/dL (ref 6–20)
CO2: 22 mmol/L (ref 22–32)
Calcium: 8.5 mg/dL — ABNORMAL LOW (ref 8.9–10.3)
Chloride: 103 mmol/L (ref 98–111)
Creatinine, Ser: 0.37 mg/dL — ABNORMAL LOW (ref 0.44–1.00)
GFR, Estimated: >60 mL/min (ref >60)
Glucose, Bld: 91 mg/dL (ref 70–99)
Potassium: 3.5 mmol/L (ref 3.5–5.1)
Sodium: 135 mmol/L (ref 135–145)
Total Bilirubin: 0.7 mg/dL (ref 0.3–1.2)
Total Protein: 6.1 g/dL — ABNORMAL LOW (ref 6.5–8.1)

## 2021-09-12 LAB — LIPID PANEL
Cholesterol: 144 mg/dL (ref 0–200)
HDL: 38 mg/dL — ABNORMAL LOW (ref >40)
LDL Cholesterol: 87 mg/dL (ref 0–99)
Total CHOL/HDL Ratio: 3.8 RATIO
Triglycerides: 94 mg/dL (ref <150)
VLDL: 19 mg/dL (ref 0–40)

## 2021-09-12 LAB — HEMOGLOBIN A1C
Hgb A1c MFr Bld: 4.7 % — ABNORMAL LOW (ref 4.8–5.6)
Mean Plasma Glucose: 88.19 mg/dL

## 2021-09-12 LAB — HIV ANTIBODY (ROUTINE TESTING W REFLEX): HIV Screen 4th Generation wRfx: NONREACTIVE

## 2021-09-12 LAB — CBC
HCT: 27.5 % — ABNORMAL LOW (ref 36.0–46.0)
Hemoglobin: 9.2 g/dL — ABNORMAL LOW (ref 12.0–15.0)
MCH: 31.9 pg (ref 26.0–34.0)
MCHC: 33.5 g/dL (ref 30.0–36.0)
MCV: 95.5 fL (ref 80.0–100.0)
Platelets: 131 10*3/uL — ABNORMAL LOW (ref 150–400)
RBC: 2.88 MIL/uL — ABNORMAL LOW (ref 3.87–5.11)
RDW: 15.9 % — ABNORMAL HIGH (ref 11.5–15.5)
WBC: 7.1 10*3/uL (ref 4.0–10.5)
nRBC: 0 % (ref 0.0–0.2)

## 2021-09-12 MED ORDER — ATORVASTATIN CALCIUM 40 MG PO TABS: 40.0000 mg | ORAL_TABLET | Freq: Every day | ORAL | 0 refills | Status: DC

## 2021-09-12 MED ORDER — HYDROCORTISONE 1 % EX CREA
TOPICAL_CREAM | Freq: Two times a day (BID) | CUTANEOUS | Status: DC
  Filled 2021-09-12: qty 28

## 2021-09-12 MED ORDER — MOLNUPIRAVIR EUA 200MG CAPSULE
4.0000 | ORAL_CAPSULE | Freq: Two times a day (BID) | ORAL | Status: DC
  Filled 2021-09-12: qty 4

## 2021-09-12 MED ORDER — APIXABAN 2.5 MG PO TABS: 2.5000 mg | ORAL_TABLET | Freq: Two times a day (BID) | ORAL | 0 refills | Status: AC

## 2021-09-12 MED ORDER — MOLNUPIRAVIR EUA 200MG CAPSULE: 4.0000 | ORAL_CAPSULE | Freq: Two times a day (BID) | ORAL | 0 refills | Status: AC

## 2021-09-12 MED ORDER — ASPIRIN EC 81 MG PO TBEC
81.0000 mg | DELAYED_RELEASE_TABLET | Freq: Every day | ORAL | Status: DC
  Administered 2021-09-12: 81 mg via ORAL
  Filled 2021-09-12: qty 1

## 2021-09-12 MED ORDER — POTASSIUM CHLORIDE 20 MEQ PO PACK
20.0000 meq | PACK | Freq: Once | ORAL | Status: AC
  Administered 2021-09-12: 20 meq via ORAL
  Filled 2021-09-12: qty 1

## 2021-09-12 MED ORDER — CLOPIDOGREL BISULFATE 75 MG PO TABS
75.0000 mg | ORAL_TABLET | Freq: Every day | ORAL | Status: DC
  Administered 2021-09-12: 75 mg via ORAL
  Filled 2021-09-12: qty 1

## 2021-09-12 MED ORDER — HYDROCORTISONE 1 % EX LOTN
TOPICAL_LOTION | Freq: Two times a day (BID) | CUTANEOUS | Status: DC
  Filled 2021-09-12: qty 118

## 2021-09-12 MED ORDER — APIXABAN 2.5 MG PO TABS: 2.5000 mg | ORAL_TABLET | Freq: Two times a day (BID) | ORAL | Status: DC

## 2021-09-12 MED ORDER — ATORVASTATIN CALCIUM 40 MG PO TABS
40.0000 mg | ORAL_TABLET | Freq: Every day | ORAL | Status: DC
  Administered 2021-09-12: 40 mg via ORAL
  Filled 2021-09-12: qty 1

## 2021-09-12 NOTE — Assessment & Plan Note (Signed)
Potassium 3.4 ?-Replace and recheck in the a.m. ?

## 2021-09-12 NOTE — Evaluation (Signed)
Physical Therapy Evaluation ?Patient Details ?Name: April Walsh ?MRN: [id number] ?DOB: April 17, 1966 ?Today's Date: 09/12/2021 ? ?History of Present Illness ? April Walsh is a 56 y.o. female with medical history significant of stage IV lung cancer, COPD, folic acid deficiency, presents to the ED with a chief complaint of difficulty with speech and difficulty with mobility.  Patient reports that her last known normal was 7 PM on the first.  She woke up at 8 AM on the second, and just did not feel right.  Today she noticed she had slurred speech and she had weakness on her left upper and lower extremity.  She denies any numbness or tingling.  She denies any new trouble swallowing.  She does report that she has had dysphagia since her cancer diagnosis.  It would feel like there was a bubble and sharpness going down with her food whenever she swallowed.  Its been improved since she has been treated for cancer.  Patient denies any change in vision or change in hearing.  At the time of my exam her speech is no longer slurred.  She does feel like the symptoms are getting better.  She has no history of stroke.  She has no family history of stroke.  She has no history of A-fib or blood clots to her knowledge.  She has no history of high cholesterol to her knowledge.  Patient does report cough and rash on review of systems.  She reports it is a dry cough that is been present for her entire cancer diagnosis, treatment process she has a rash on her chest that is new.  It started after her last radiation treatment on Tuesday.  It itches and burns.  When she sweats it really burns.  It is located under her bra underwire on both sides of her chest.  She attributed it to her radiation so did not think much of it.  Patient has no other complaints at this time. (Per MD note) ?  ?Clinical Impression ? Patient functioning at baseline for functional mobility and gait demonstrating good return for bed mobility, transfers and walking in room  without loss of balance.  Plan:  Patient discharged from physical therapy to care of nursing for ambulation daily as tolerated for length of stay.  ?   ?   ? ?Recommendations for follow up therapy are one component of a multi-disciplinary discharge planning process, led by the attending physician.  Recommendations may be updated based on patient status, additional functional criteria and insurance authorization. ? ?Follow Up Recommendations No PT follow up ? ?  ?Assistance Recommended at Discharge PRN  ?Patient can return home with the following ? Help with stairs or ramp for entrance ? ?  ?Equipment Recommendations None recommended by PT  ?Recommendations for Other Services ?    ?  ?Functional Status Assessment Patient has not had a recent decline in their functional status  ? ?  ?Precautions / Restrictions Precautions ?Precautions: None ?Restrictions ?Weight Bearing Restrictions: No  ? ?  ? ?Mobility ? Bed Mobility ?Overal bed mobility: Independent ?  ?  ?  ?  ?  ?  ?  ?  ? ?Transfers ?Overall transfer level: Independent ?  ?  ?  ?  ?  ?  ?  ?  ?General transfer comment: good return for transferring to/from chair ?  ? ?Ambulation/Gait ?Ambulation/Gait assistance: Modified independent (Device/Increase time) ?Gait Distance (Feet): 50 Feet ?Assistive device: None ?Gait Pattern/deviations: WFL(Within Functional Limits) ?Gait velocity: slightly decreased ?  ?  ?  General Gait Details: grossly WFL with good return demonstrated for ambulation in room without loss of balance ? ?Stairs ?  ?  ?  ?  ?  ? ?Wheelchair Mobility ?  ? ?Modified Rankin (Stroke Patients Only) ?  ? ?  ? ?Balance Overall balance assessment: Independent ?  ?  ?  ?  ?  ?  ?  ?  ?  ?  ?  ?  ?  ?  ?  ?  ?  ?  ?   ? ? ? ?Pertinent Vitals/Pain Pain Assessment ?Pain Assessment: No/denies pain  ? ? ?Home Living Family/patient expects to be discharged to:: Private residence ?Living Arrangements: Spouse/significant other;Other (Comment) (May stay with aunt if  needed.) ?Available Help at Discharge: Family;Available 24 hours/day ?Type of Home: Mobile home ?Home Access: Stairs to enter ?Entrance Stairs-Rails: Right ?Entrance Stairs-Number of Steps: 3 ?  ?Home Layout: One level ?Home Equipment: Kasandra Knudsen - single point ?   ?  ?Prior Function Prior Level of Function : Independent/Modified Independent ?  ?  ?  ?  ?  ?  ?Mobility Comments: Independent community ambulator without AD ?ADLs Comments: Independent ADL and IADL's. ?  ? ? ?Hand Dominance  ? Dominant Hand: Right ? ?  ?Extremity/Trunk Assessment  ? Upper Extremity Assessment ?Upper Extremity Assessment: Defer to OT evaluation ?  ? ?Lower Extremity Assessment ?Lower Extremity Assessment: Overall WFL for tasks assessed ?  ? ?Cervical / Trunk Assessment ?Cervical / Trunk Assessment: Normal  ?Communication  ? Communication: No difficulties  ?Cognition Arousal/Alertness: Awake/alert ?Behavior During Therapy: [facility] for tasks assessed/performed ?Overall Cognitive Status: Within Functional Limits for tasks assessed ?  ?  ?  ?  ?  ?  ?  ?  ?  ?  ?  ?  ?  ?  ?  ?  ?  ?  ?  ? ?  ?General Comments   ? ?  ?Exercises    ? ?Assessment/Plan  ?  ?PT Assessment Patient does not need any further PT services  ?PT Problem List   ? ?   ?  ?PT Treatment Interventions     ? ?PT Goals (Current goals can be found in the Care Plan section)  ?Acute Rehab PT Goals ?Patient Stated Goal: return home with family to assist ?Time For Goal Achievement: 09/12/21 ?Potential to Achieve Goals: Good ? ?  ?Frequency   ?  ? ? ?Co-evaluation PT/OT/SLP Co-Evaluation/Treatment: Yes ?Reason for Co-Treatment: To address functional/ADL transfers ?PT goals addressed during session: Mobility/safety with mobility;Balance ?OT goals addressed during session: ADL's and self-care ?  ? ? ?  ?AM-PAC PT "6 Clicks" Mobility  ?Outcome Measure Help needed turning from your back to your side while in a flat bed without using bedrails?: None ?Help needed moving from lying on your back to  sitting on the side of a flat bed without using bedrails?: None ?Help needed moving to and from a bed to a chair (including a wheelchair)?: None ?Help needed standing up from a chair using your arms (e.g., wheelchair or bedside chair)?: None ?Help needed to walk in hospital room?: None ?Help needed climbing 3-5 steps with a railing? : None ?6 Click Score: 24 ? ?  ?End of Session   ?Activity Tolerance: Patient tolerated treatment well ?Patient left: in chair;with family/visitor present ?Nurse Communication: Mobility status ?PT Visit Diagnosis: Unsteadiness on feet (R26.81);Other abnormalities of gait and mobility (R26.89);Muscle weakness (generalized) (M62.81) ?  ? ?Time: 8588-5027 ?PT Time Calculation (min) (ACUTE  ONLY): 20 min ? ? ?Charges:   PT Evaluation ?$PT Eval Moderate Complexity: 1 Mod ?PT Treatments ?$Therapeutic Activity: 8-22 mins ?  ?   ? ? ?10:11 AM, 09/12/21 ?Lonell Grandchild, MPT ?Physical Therapist with [place] ?[facility] ?[phone] office ?[phone] mobile phone ? ? ?

## 2021-09-12 NOTE — Assessment & Plan Note (Signed)
-   Since patient has started radiation she has a rash on her chest under her underwire ?-There is minor erythema, small scabs, but the area is dry and overall there is no edema ?-Apply hydrocortisone cream twice daily ?-Continue to monitor ?

## 2021-09-12 NOTE — Consult Note (Signed)
I connected with  Tonnya Garbett on 09/12/21 by a video enabled telemedicine application and verified that I am speaking with the correct person using two identifiers. ?  ?I discussed the limitations of evaluation and management by telemedicine. The patient expressed understanding and agreed to proceed. ? ?Location of patient: [facility] ?Location of physician: [facility] ? ? ?Neurology Consultation ?Reason for Consult: Stroke ?Referring Physician: Dr. Vance Gather ? ?CC: Weakness, speech disturbance ? ?History is obtained from: Patient, chart review ? ?HPI: April Walsh is a 56 y.o. female with medical history of stage IV non-small cell lung cancer, adenocarcinoma with right hilar mass in addition to mediastinal and upper abdominal lymphadenopathy, bilateral adrenal mets and metastatic disease in the lower back musculature, nicotine use disorder presented with acute onset left-sided weakness and speech disturbance.  Patient states he went to bed at around 10 PM on 09/10/2021.  When he woke up around 8 AM on 09/11/2021 she noticed weakness in left upper and lower extremity as well as slurred speech which has since resolved. ? ?Last known normal: 09/10/2021 7 PM ?Event happened at home ?No tPA as outside window ?No thrombectomy as outside window ?mRS 0 ? ?ROS: All other systems reviewed and negative except as noted in the HPI.  ? ?Past Medical History:  ?Diagnosis Date  ? Anemia   ? Asthma   ? Bronchitis   ? Cancer [facility])   ? basal cell melanoma, cervical cancer  ? COPD (chronic obstructive pulmonary disease) ([place])   ? "early stages of COPD"  ? Headache   ? Lung cancer ([place]) 08/13/2021  ? ? ?Family History  ?Problem Relation Age of Onset  ? Brain cancer Maternal Aunt   ? Throat cancer Maternal Aunt   ? Lung cancer Paternal Uncle   ?     he smoked  ? Lung cancer Paternal Uncle   ?     he smoked  ? Ovarian cancer Maternal Grandmother   ? Lung cancer Cousin   ? ? ?Social History:  reports that she has been  smoking cigarettes. She has a 16.00 pack-year smoking history. She has been exposed to tobacco smoke. She has never used smokeless tobacco. She reports that she does not drink alcohol and does not use drugs. ? ? ?Exam: ?Current vital signs: ?BP 126/73   Pulse (!) 125   Temp 98.4 ?F (36.9 ?C) (Oral)   Resp (!) 27   Ht 5' (1.524 m)   SpO2 97%   BMI 21.31 kg/m?  ?Vital signs in last 24 hours: ?Temp:  [98.4 ?F (36.9 ?C)-98.8 ?F (37.1 ?C)] 98.4 ?F (36.9 ?C) (05/03 1108) ?Pulse Rate:  [108-129] 125 (05/03 1200) ?Resp:  [19-31] 27 (05/03 1200) ?BP: (105-131)/(73-98) 126/73 (05/03 1200) ?SpO2:  [95 %-98 %] 97 % (05/03 1200) ? ? ?Physical Exam  ?Constitutional: Appears well-developed and well-nourished.  ?Psych: Affect appropriate to situation ?Eyes: No scleral injection ?Neuro: AOx3, cranial nerves 2- 12 grossly intact, antigravity strength in all 4 extremities without drift, FTN intact bilaterally ? ?NIHSS 0 ? ? ?I have reviewed labs in epic and the results pertinent to this consultation are: ?CBC:  ?Recent Labs  ?Lab 09/11/21 ?1922 09/11/21 ?1933 09/12/21 ?0340  ?WBC 8.7  --  7.1  ?NEUTROABS 5.5  --   --   ?HGB 10.5* 11.6* 9.2*  ?HCT 31.3* 34.0* 27.5*  ?MCV 95.7  --  95.5  ?PLT 149*  --  131*  ? ? ?Basic Metabolic Panel:  ?Lab Results  ?  Component Value Date  ? NA 135 09/12/2021  ? K 3.5 09/12/2021  ? CO2 22 09/12/2021  ? GLUCOSE 91 09/12/2021  ? BUN 7 09/12/2021  ? CREATININE 0.37 (L) 09/12/2021  ? CALCIUM 8.5 (L) 09/12/2021  ? GFRNONAA >60 09/12/2021  ? ?Lipid Panel:  ?Lab Results  ?Component Value Date  ? [place] 87 09/12/2021  ? ?HgbA1c:  ?Lab Results  ?Component Value Date  ? HGBA1C 4.7 (L) 09/12/2021  ? ?Urine Drug Screen:  ?   ?Component Value Date/Time  ? [place] DETECTED 09/11/2021 2146  ? [place] DETECTED 09/11/2021 2146  ? [place] DETECTED 09/11/2021 2146  ? AMPHETMU NONE DETECTED 09/11/2021 2146  ? THCU POSITIVE (A) 09/11/2021 2146  ? [place] DETECTED 09/11/2021 2146  ?  ?Alcohol  Level  ?   ?Component Value Date/Time  ? ETH <10 09/11/2021 1921  ? ? ? ?I have reviewed the images obtained: ?CT head and neck with and without contrast 09/11/2021: Occlusion of a right MCA proximal M2 branch. 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 Right hilar mass as previously demonstrated on PET CT of 08/17/2021. ? ?MRI brain without contrast 09/12/2021: Small acute right MCA territory infarcts. . Punctate acute left frontal lobe infarct. Mild chronic small vessel ischemic disease. ?  ? ? ? ? ?ASSESSMENT/PLAN: 56 year old female who presented with sudden weakness on the left side as well as speech disturbance and MRI brain showed acute ischemic stroke. ? ?Acute ischemic stroke ?-Etiology: Likely embolic versus hypercoagulable state due to malignancy ? ?Recommendations: ?-TTE ordered and pending ?-Recommend starting Eliquis 2.5 mg twice daily (reduced dose due to weight less than 60 kg) ?-Recommended restarting 40 mg daily ?-Discussed stroke risk factors, stroke education including BEFAST ?-Counseled about increased risk of bleeding and to call primary care provider/neurologist if any hemoptysis, hematemesis, blood in stools, etc. ?-PT/OT/SLP ?-Goal blood pressure: Normotension ?-Stat CT head for any acute change in neuro status ?-Follow-up with neurology in 4 to 6 weeks ?-Discussed plan with patient, family at bedside and Dr. Bonner Puna ? ?Thank you for allowing [place] to participate in the care of this patient. If you have any further questions, please contact  me or neurohospitalist.  ? ?Zeb Comfort ?Epilepsy ?[facility] neuro[facility]ist ? ? ? ? ?

## 2021-09-12 NOTE — ED Notes (Signed)
PT and OT at bedside

## 2021-09-12 NOTE — H&P (Signed)
?History and Physical  ? ? ?Patient: April Walsh DGU:[id number] DOB: 05-28-1965 ?DOA: 09/11/2021 ?DOS: the patient was seen and examined on 09/12/2021 ?PCP: [name], FNP  ?Patient coming from: Home ? ?Chief Complaint:  ?Chief Complaint  ?Patient presents with  ? Weakness  ? ?HPI: April Walsh is a 56 y.o. female with medical history significant of stage IV lung cancer, COPD, folic acid deficiency, presents to the ED with a chief complaint of difficulty with speech and difficulty with mobility.  Patient reports that her last known normal was 7 PM on the first.  She woke up at 8 AM on the second, and just did not feel right.  Today she noticed she had slurred speech and she had weakness on her left upper and lower extremity.  She denies any numbness or tingling.  She denies any new trouble swallowing.  She does report that she has had dysphagia since her cancer diagnosis.  It would feel like there was a bubble and sharpness going down with her food whenever she swallowed.  Its been improved since she has been treated for cancer.  Patient denies any change in vision or change in hearing.  At the time of my exam her speech is no longer slurred.  She does feel like the symptoms are getting better.  She has no history of stroke.  She has no family history of stroke.  She has no history of A-fib or blood clots to her knowledge.  She has no history of high cholesterol to her knowledge.  Patient does report cough and rash on review of systems.  She reports it is a dry cough that is been present for her entire cancer diagnosis, treatment process she has a rash on her chest that is new.  It started after her last radiation treatment on Tuesday.  It itches and burns.  When she sweats it really burns.  It is located under her bra underwire on both sides of her chest.  She attributed it to her radiation so did not think much of it.  Patient has no other complaints at this time. ? ?Patient smokes half a pack a day,  working on quitting.  She does not drink alcohol.  She denies illicit drugs although she does have a positive screen for marijuana.  She is full code. ? ? ?Review of Systems: As mentioned in the history of present illness. All other systems reviewed and are negative. ?Past Medical History:  ?Diagnosis Date  ? Anemia   ? Asthma   ? Bronchitis   ? Cancer [facility])   ? basal cell melanoma, cervical cancer  ? COPD (chronic obstructive pulmonary disease) ([place])   ? "early stages of COPD"  ? Headache   ? Lung cancer ([place]) 08/13/2021  ? ?Past Surgical History:  ?Procedure Laterality Date  ? APPENDECTOMY    ? 1996  ? BREAST BIOPSY Right   ? 2010-2015  ? BRONCHIAL BIOPSY  08/13/2021  ? Procedure: BRONCHIAL BIOPSIES;  Surgeon: Collene Gobble, MD;  Location: [facility] ENDOSCOPY;  Service: Pulmonary;;  ? BRONCHIAL BRUSHINGS  08/13/2021  ? Procedure: BRONCHIAL BRUSHINGS;  Surgeon: Collene Gobble, MD;  Location: [facility] ENDOSCOPY;  Service: Pulmonary;;  ? BRONCHIAL NEEDLE ASPIRATION BIOPSY  08/13/2021  ? Procedure: BRONCHIAL NEEDLE ASPIRATION BIOPSIES;  Surgeon: Collene Gobble, MD;  Location: [facility] ENDOSCOPY;  Service: Pulmonary;;  ? CESAREAN SECTION    ? 3, 1987  ? FOOT SURGERY Right   ? 2 pins and screw 1999  ?  FOOT SURGERY Left 06/21/2021  ? screw and 2 pins  ? HEMOSTASIS CONTROL  08/13/2021  ? Procedure: HEMOSTASIS CONTROL;  Surgeon: Collene Gobble, MD;  Location: [facility] ENDOSCOPY;  Service: Pulmonary;;  ? MELANOMA EXCISION Left   ? basal cell melanoma 2011  ? PARTIAL HYSTERECTOMY    ? 1994  ? TUBAL LIGATION    ? 1987  ? VIDEO BRONCHOSCOPY  08/13/2021  ? Procedure: VIDEO BRONCHOSCOPY WITHOUT FLUORO;  Surgeon: Collene Gobble, MD;  Location: [facility] ENDOSCOPY;  Service: Pulmonary;;  ? VIDEO BRONCHOSCOPY WITH ENDOBRONCHIAL ULTRASOUND N/A 08/13/2021  ? Procedure: VIDEO BRONCHOSCOPY WITH ENDOBRONCHIAL ULTRASOUND;  Surgeon: Collene Gobble, MD;  Location: [facility] ENDOSCOPY;  Service: Pulmonary;  Laterality: N/A;  ? ?Social History:  reports that she has been smoking  cigarettes. She has a 16.00 pack-year smoking history. She has been exposed to tobacco smoke. She has never used smokeless tobacco. She reports that she does not drink alcohol and does not use drugs. ? ?No Known Allergies ? ?Family History  ?Problem Relation Age of Onset  ? Brain cancer Maternal Aunt   ? Throat cancer Maternal Aunt   ? Lung cancer Paternal Uncle   ?     he smoked  ? Lung cancer Paternal Uncle   ?     he smoked  ? Ovarian cancer Maternal Grandmother   ? Lung cancer Cousin   ? ? ?Prior to Admission medications   ?Medication Sig Start Date End Date Taking? Authorizing Provider  ?ADVAIR HFA 115-21 MCG/ACT inhaler Inhale 2 puffs into the lungs 2 (two) times daily. 07/12/21  Yes [provider]  ?folic acid (FOLVITE) 1 MG tablet Take 1 tablet (1 mg total) by mouth daily. 08/21/21  Yes Heilingoetter, Cassandra L, PA-C  ?ibuprofen (ADVIL) 200 MG tablet Take 600 mg by mouth every 8 (eight) hours as needed (for pain).   Yes [provider]  ?prochlorperazine (COMPAZINE) 10 MG tablet Take 1 tablet (10 mg total) by mouth every 6 (six) hours as needed. 08/21/21  Yes Heilingoetter, Cassandra L, PA-C  ? ? ?Physical Exam: ?Vitals:  ? 09/11/21 2030 09/11/21 2215 09/11/21 2230 09/12/21 0330  ?BP: 112/86 126/85 (!) 127/96 123/84  ?Pulse: (!) 114  (!) 121 (!) 122  ?Resp: (!) 26 (!) 29 (!) 24 19  ?Temp:      ?TempSrc:      ?SpO2: 98%  95% 95%  ?Height:      ? ?1.  General: ?Patient lying supine in bed,  no acute distress ?  ?2. Psychiatric: ?Alert and oriented x 3, mood and behavior normal for situation, pleasant and cooperative with exam ?  ?3. Neurologic: ?Speech and language are normal, face is symmetric, moves all 4 extremities voluntarily, subtle weakness in the left upper and lower extremity compared to the right upper and lower extremity, still 5 out of 5 strength ?  ?4. HEENMT:  ?Head is atraumatic, normocephalic, pupils reactive to light, neck is supple, trachea is midline, mucous membranes are  moist ?  ?5. Respiratory : ?Rhonchi in the bilateral lung fields, no wheezing, no rales, no increased work of breathing, no cyanosis ?  ?6. Cardiovascular : ?Heart rate tachycardic, rhythm is regular, no murmurs, rubs or gallops, no peripheral edema, peripheral pulses palpated ?  ?7. Gastrointestinal:  ?Abdomen is soft, nondistended, nontender to palpation bowel sounds active, no masses or organomegaly palpated ?  ?8. Skin:  ?Skin is warm, dry and intact with rash over anterior chest wall ?  ?  9.Musculoskeletal:  ?No acute deformities or trauma, no asymmetry in tone, no peripheral edema, peripheral pulses palpated, no tenderness to palpation in the extremities ? 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 ? ?Assessment and Plan: ?* CVA (cerebral vascular accident) ([place]) ?- CT head shows occlusion of the right MCA proximal M2 ?-MRI brain in the a.m. ?-Echo in the a.m. ?-Continue dual antiplatelet therapy ?-Consult tele neuro ?-ST/PT/OT eval and treat ?-monitor on telemetry ? ?Rash ?- Since patient has started radiation she has a rash on her chest under her underwire ?-There is minor erythema, small scabs, but the area is dry and overall there is  no edema ?-Apply hydrocortisone cream twice daily ?-Continue to monitor ? ?Dysphagia ?- Since cancer diagnosis ?-Consult speech therapy ?-Continue to monitor ? ?Protein calorie malnutrition ([place]) ?- Albumin 3.

## 2021-09-12 NOTE — Assessment & Plan Note (Addendum)
-   CT head shows occlusion of the right MCA proximal M2 ?-MRI brain in the a.m. ?-Echo in the a.m. ?-Continue dual antiplatelet therapy ?-Consult tele neuro ?-ST/PT/OT eval and treat ?-monitor on telemetry ?

## 2021-09-12 NOTE — ED Notes (Signed)
ECHO at bedside.

## 2021-09-12 NOTE — Discharge Summary (Signed)
?Physician Discharge Summary ?  ?Patient: April Walsh MRN: [id number] DOB: [birth year]  ?Admit date:     09/11/2021  ?Discharge date: 09/12/21  ?Discharge Physician: Patrecia Pour  ? ?PCP: [name], FNP  ? ?Recommendations at discharge:  ?Follow up with neurology. Started eliquis and atorvastatin for stroke ? ?Discharge Diagnoses: ?Principal Problem: ?  CVA (cerebral vascular accident) ([place]) ?Active Problems: ?  COPD (chronic obstructive pulmonary disease) ([place]) ?  Adenocarcinoma of right lung, stage 4 ([place]) ?  Hypokalemia ?  Tachycardia ?  Protein calorie malnutrition ([place]) ?  Dysphagia ?  Rash ? ?Hospital Course: ?April Walsh is a 56 y.o. female with medical history significant of stage IV lung cancer, COPD, folic acid deficiency, presents to the ED with a chief complaint of difficulty with speech and difficulty with mobility.  Patient reports that her last known normal was 7 PM on the first.  She woke up at 8 AM on the second, and just did not feel right.  Today she noticed she had slurred speech and she had weakness on her left upper and lower extremity.  She denies any numbness or tingling.  She denies any new trouble swallowing.  She does report that she has had dysphagia since her cancer diagnosis.  It would feel like there was a bubble and sharpness going down with her food whenever she swallowed.  Its been improved since she has been treated for cancer.  Patient denies any change in vision or change in hearing.  At the time of my exam her speech is no longer slurred.  She does feel like the symptoms are getting better.  She has no history of stroke.  She has no family history of stroke.  She has no history of A-fib or blood clots to her knowledge.  She has no history of high cholesterol to her knowledge.  Patient does report cough and rash on review of systems.  She reports it is a dry cough that is been present for her entire cancer diagnosis, treatment process she has a rash on her chest that  is new.  It started after her last radiation treatment on Tuesday.  It itches and burns.  When she sweats it really burns.  It is located under her bra underwire on both sides of her chest.  She attributed it to her radiation so did not think much of it.  Patient has no other complaints at this time. ?  ?Patient smokes half a pack a day, working on quitting.  She does not drink alcohol.  She denies illicit drugs although she does have a positive screen for marijuana.  She is full code. ?  ?She was admitted for stroke work up, neurology evaluation, and therapy recommendations. Her symptoms have fortunately improved. MRI revealed right MCA distribution stroke as well as an acute left frontal infarct. Echocardiogram is pending. Screening SARS-CoV-2 PCR was positive for which molnupiravir is started (due to patient requiring initiation of statin and plavix). She is not hypoxemic and, based on PT evaluation, requires no further rehabilitation.  ? ?Assessment and Plan: ?Acute right MCA and left frontal ischemic CVAs: Deficits fortunately improving.  ?- Echocardiogram pending. No atrial fibrillation yet detected, though with bilateral cortical infarcts, embolic etiology is suspected. Will await neurology evaluations. Dr. Hortense Ramal consulting.  ?- Started on DAPT, will follow neuro rec's going forward.  ?- Started atorvastatin 40mg  for LDL 87. HbA1c is 4.7%.  ?- Defer further vascular imaging to neurology at this time.  ?-  PT/OT/SLP evaluations ordered (hx dysphagia).  ?  ?Symptomatic but not severe covid-19 infection without evidence of pneumonia or PE by CTA chest: Thrombocytopenia most likely related to covid-19.  ?- Start molnupiravir x5 days. Pt will need initiation of plavix and statin, worry for drug-drug interactions with paxlovid.  ?- No current indication for steroids.  ?  ?Stage 4 right lung adenocarcinoma:  ?- Undergoing XRT, final Tx 4/25, followed also by Dr. Julien Nordmann at [facility].  ?- Findings on CTA chest noted to be  stable including right hilar mass, posterior RUL nodule, subcarinal LN and bilateral adrenal metastatic disease.  ?  ?Mild protein calorie malnutrition with hypoalbuminemia:  ?- Supplement protein as able.  ?  ?Sinus tachycardia: This appears to be chronic based on outpatient notes. TSH 0.618.  ?- Refractory to IVF. Appears euvolemic. She is anemic though this chronic in the setting of malignancy and no active bleeding. ?- ECG showed sinus tachycardia with prominent P waves and suggestion of RVH. Will follow up echo. ? ?Consultants: Neurology ?Procedures performed: Echo  ?Disposition: Home ?Diet recommendation:  ?Discharge Diet Orders (From admission, onward)  ? ?  Start     Ordered  ? 09/12/21 0000  Diet - low sodium heart healthy       ? 09/12/21 1531  ? ?  ?  ? ?  ? ?Cardiac diet ?DISCHARGE MEDICATION: ?Allergies as of 09/12/2021   ?No Known Allergies ?  ? ?  ?Medication List  ?  ? ?STOP taking these medications   ? ?ibuprofen 200 MG tablet ?Commonly known as: ADVIL ?  ? ?  ? ?TAKE these medications   ? ?Advair HFA 115-21 MCG/ACT inhaler ?Generic drug: fluticasone-salmeterol ?Inhale 2 puffs into the lungs 2 (two) times daily. ?  ?apixaban 2.5 MG Tabs tablet ?Commonly known as: ELIQUIS ?Take 1 tablet (2.5 mg total) by mouth 2 (two) times daily. ?  ?atorvastatin 40 MG tablet ?Commonly known as: LIPITOR ?Take 1 tablet (40 mg total) by mouth daily. ?Start taking on: Sep 14, 8586 ?  ?folic acid 1 MG tablet ?Commonly known as: FOLVITE ?Take 1 tablet (1 mg total) by mouth daily. ?  ?molnupiravir EUA 200 mg Caps capsule ?Commonly known as: LAGEVRIO ?Take 4 capsules (800 mg total) by mouth 2 (two) times daily for 5 days. ?  ?prochlorperazine 10 MG tablet ?Commonly known as: COMPAZINE ?Take 1 tablet (10 mg total) by mouth every 6 (six) hours as needed. ?  ? ?  ? ? Follow-up Information   ? ? [name], FNP Follow up.   ?Specialty: Family Medicine ?Contact information: ?[address] [place] [address] ?[place]  [address] ?[phone] ? ? ?  ?  ? ? Garvin Fila, MD Follow up.   ?Specialties: Neurology, Radiology ?Contact information: ?[address] ?[address] ?[place] [address] ?[phone] ? ? ?  ?  ? ?  ?  ? ?  ? ?Discharge Exam: ?BP 126/73   Pulse (!) 125   Temp 98.4 ?F (36.9 ?C) (Oral)   Resp (!) 27   Ht 5' (1.524 m)   SpO2 97%   BMI 21.31 kg/m?   ?Gen: 56yo F in no acute distress ?Pulm: Clear and nonlabored, RR 18 on my exam.   ?CV: Regular tachycardia, no murmur, no JVD, no edema ?GI: Soft, NT, ND, +BS  ?Neuro: Alert and oriented. Mild weakness on left grip, LLE hip flexion compared to right but still essentially 5/5. Follows commands. Ambulatory with steady gait. No dysarthria or  aphasia noted. ?Skin: Chest wall with shallow erythematous eruption without exudate.  ?  ? ?Condition at discharge: stable ? ?The results of significant diagnostics from this hospitalization (including imaging, microbiology, ancillary and laboratory) are listed below for reference.  ? ?Imaging Studies: ?CT ANGIO HEAD NECK W WO CM ? ?Result Date: 09/11/2021 ?CLINICAL DATA:  Speech delay and extremity weakness EXAM: CT ANGIOGRAPHY HEAD AND NECK TECHNIQUE: Multidetector CT imaging of the head and neck was performed using the standard protocol during bolus administration of intravenous contrast. Multiplanar CT image reconstructions and MIPs were obtained to evaluate the vascular anatomy. Carotid stenosis measurements (when applicable) are obtained utilizing NASCET criteria, using the distal internal carotid diameter as the denominator. RADIATION DOSE REDUCTION: This exam was performed according to the departmental dose-optimization program which includes automated exposure control, adjustment of the mA and/or kV according to patient size and/or use of iterative reconstruction technique. CONTRAST:  111mL OMNIPAQUE IOHEXOL 350 MG/ML SOLN COMPARISON:  None Available. FINDINGS: CT HEAD FINDINGS Brain: There is no mass, hemorrhage or  extra-axial collection. The size and configuration of the ventricles and extra-axial CSF spaces are normal. There is no acute or chronic infarction. The brain parenchyma is normal. Skull: The visualized skull base, calvarium

## 2021-09-12 NOTE — ED Notes (Signed)
Patient transported to MRI 

## 2021-09-12 NOTE — Progress Notes (Signed)
?  Transition of Care (TOC) Screening Note ? ? ?Patient Details  ?Name: April Walsh ?Date of Birth: 08/24/1965 ? ? ?Transition of Care (TOC) CM/SW Contact:    ?Iona Beard, LCSWA ?Phone Number: ?09/12/2021, 11:13 AM ? ? ? ?Transition of Care Department [facility]) has reviewed patient and no TOC needs have been identified at this time. We will continue to monitor patient advancement through interdisciplinary progression rounds. If new patient transition needs arise, please place a TOC consult. ?  ?

## 2021-09-12 NOTE — ED Notes (Signed)
Molnupiravir just found in [place].  Per Pharmacy, hold until 2200 dose.  ?

## 2021-09-12 NOTE — Assessment & Plan Note (Signed)
-   Since cancer diagnosis ?-Consult speech therapy ?-Continue to monitor ?

## 2021-09-12 NOTE — ED Notes (Signed)
Tele Neuro at bedside 

## 2021-09-12 NOTE — Assessment & Plan Note (Signed)
-   Patient's last radiation treatment was last Tuesday ?-Patient follows with oncology at [facility] ?-Continue to monitor ?

## 2021-09-12 NOTE — ED Notes (Signed)
Hospitalist at bedside 

## 2021-09-12 NOTE — Progress Notes (Signed)
PROGRESS NOTE ? ?Brief Narrative: ? April Walsh is a 56 y.o. female with medical history significant of stage IV lung cancer, COPD, folic acid deficiency, presents to the ED with a chief complaint of difficulty with speech and difficulty with mobility.  Patient reports that her last known normal was 7 PM on the first.  She woke up at 8 AM on the second, and just did not feel right.  Today she noticed she had slurred speech and she had weakness on her left upper and lower extremity.  She denies any numbness or tingling.  She denies any new trouble swallowing.  She does report that she has had dysphagia since her cancer diagnosis.  It would feel like there was a bubble and sharpness going down with her food whenever she swallowed.  Its been improved since she has been treated for cancer.  Patient denies any change in vision or change in hearing.  At the time of my exam her speech is no longer slurred.  She does feel like the symptoms are getting better.  She has no history of stroke.  She has no family history of stroke.  She has no history of A-fib or blood clots to her knowledge.  She has no history of high cholesterol to her knowledge.  Patient does report cough and rash on review of systems.  She reports it is a dry cough that is been present for her entire cancer diagnosis, treatment process she has a rash on her chest that is new.  It started after her last radiation treatment on Tuesday.  It itches and burns.  When she sweats it really burns.  It is located under her bra underwire on both sides of her chest.  She attributed it to her radiation so did not think much of it.  Patient has no other complaints at this time. ?  ?Patient smokes half a pack a day, working on quitting.  She does not drink alcohol.  She denies illicit drugs although she does have a positive screen for marijuana.  She is full code. ? ?She was admitted for stroke work up, neurology evaluation, and therapy recommendations. Her symptoms  have fortunately improved. MRI revealed right MCA distribution stroke as well as an acute left frontal infarct. Echocardiogram is pending. Screening SARS-CoV-2 PCR was positive for which molnupiravir is started (due to patient requiring initiation of statin and plavix). She is not hypoxemic and, based on PT evaluation, requires no further rehabilitation.  ? ?Subjective: ?Her speech is back to normal. Denies focal weakness or numbness which has also improved. She feels worn out, but no dyspnea or chest pain or fever.  ? ?Objective: ?BP 126/73   Pulse (!) 125   Temp 98.4 ?F (36.9 ?C) (Oral)   Resp (!) 27   Ht 5' (1.524 m)   SpO2 97%   BMI 21.31 kg/m?   ?Gen: 56yo F in no acute distress ?Pulm: Clear and nonlabored, RR 18 on my exam.   ?CV: Regular tachycardia, no murmur, no JVD, no edema ?GI: Soft, NT, ND, +BS  ?Neuro: Alert and oriented. Mild weakness on left grip, LLE hip flexion compared to right but still essentially 5/5. Follows commands. Ambulatory with steady gait. No dysarthria or aphasia noted. ?Skin: Chest wall with shallow erythematous eruption without exudate.  ? 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  ?- Echocardiogram pending. No atrial fibrillation yet detected, though with bilateral cortical infarcts, embolic etiology is  suspected. Will await neurology evaluations. Dr. Hortense Ramal consulting.  ?- Started on DAPT, will follow neuro rec's going forward.  ?- Started atorvastatin 40mg  for LDL 87. HbA1c is 4.7%.  ?- Defer further vascular imaging to neurology at this time.  ?- PT/OT/SLP evaluations ordered (hx dysphagia).  ? ?Symptomatic but not severe covid-19 infection without evidence of pneumonia or PE by CTA chest: Thrombocytopenia most likely related to covid-19.  ?- Start molnupiravir x5 days. Pt will need initiation of plavix and statin, worry for drug-drug interactions with paxlovid.  ?- No current indication for steroids.  ? ?Stage 4  right lung adenocarcinoma:  ?- Undergoing XRT, final Tx 4/25, followed also by Dr. Julien Nordmann at [facility].  ?- Findings on CTA chest noted to be stable including right hilar mass, posterior RUL nodule, subcarinal LN and bilateral adrenal metastatic disease.  ? ?Mild protein calorie malnutrition with hypoalbuminemia:  ?- Supplement protein as able.  ? ?Sinus tachycardia: This appears to be chronic based on outpatient notes. TSH 0.618.  ?- Refractory to IVF. Appears euvolemic. She is anemic though this chronic in the setting of malignancy and no active bleeding. ?- ECG showed sinus tachycardia with prominent P waves and suggestion of RVH. Will follow up echo. ? ?Patrecia Pour, MD ?Pager on amion ?09/12/2021, 12:28 PM   ?

## 2021-09-12 NOTE — ED Notes (Signed)
Pt wheeled to lobby.  Verbalized understanding discharge instructions, prescriptions, and follow-up. In no acute distress.  ? ?

## 2021-09-12 NOTE — Progress Notes (Deleted)
[place] OFFICE PROGRESS NOTE  [name], FNP [address] [place] [address]  DIAGNOSIS: ***  PRIOR THERAPY:  CURRENT THERAPY:  INTERVAL HISTORY: April Walsh 56 y.o. female returns for *** regular *** visit for followup of ***   MEDICAL HISTORY: Past Medical History:  Diagnosis Date   Anemia    Asthma    Bronchitis    Cancer ([place])    basal cell melanoma, cervical cancer   COPD (chronic obstructive pulmonary disease) ([place])    "early stages of COPD"   Headache    Lung cancer ([place]) 08/13/2021    ALLERGIES:  has No Known Allergies.  MEDICATIONS:  Current Outpatient Medications  Medication Sig Dispense Refill   ADVAIR HFA 115-21 MCG/ACT inhaler Inhale 2 puffs into the lungs 2 (two) times daily.     apixaban (ELIQUIS) 2.5 MG TABS tablet Take 1 tablet (2.5 mg total) by mouth 2 (two) times daily. 60 tablet 0   [START ON 09/13/2021] atorvastatin (LIPITOR) 40 MG tablet Take 1 tablet (40 mg total) by mouth daily. 30 tablet 0   folic acid (FOLVITE) 1 MG tablet Take 1 tablet (1 mg total) by mouth daily. 30 tablet 2   molnupiravir EUA (LAGEVRIO) 200 mg CAPS capsule Take 4 capsules (800 mg total) by mouth 2 (two) times daily for 5 days. 40 capsule 0   prochlorperazine (COMPAZINE) 10 MG tablet Take 1 tablet (10 mg total) by mouth every 6 (six) hours as needed. 30 tablet 2   No current facility-administered medications for this visit.   Facility-Administered Medications Ordered in Other Visits  Medication Dose Route Frequency Provider Last Rate Last Admin    stroke: early stages of recovery book   Does not apply Once Zierle-Ghosh, [place] B, DO       acetaminophen (TYLENOL) tablet 650 mg  650 mg Oral Q4H PRN Zierle-Ghosh, Asia B, DO       Or   acetaminophen (TYLENOL) 160 MG/5ML solution 650 mg  650 mg Per Tube Q4H PRN Zierle-Ghosh, Asia B, DO       Or   acetaminophen (TYLENOL) suppository 650 mg  650 mg Rectal Q4H PRN Zierle-Ghosh, Asia B, DO        apixaban (ELIQUIS) tablet 2.5 mg  2.5 mg Oral BID Patrecia Pour, MD       atorvastatin (LIPITOR) tablet 40 mg  40 mg Oral Daily Vance Gather B, MD   40 mg at 81/82/99 3716   folic acid (FOLVITE) tablet 1 mg  1 mg Oral Daily Zierle-Ghosh, Asia B, DO   1 mg at 09/12/21 0941   hydrocortisone cream 1 %   Topical BID Patrecia Pour, MD       molnupiravir EUA (LAGEVRIO) capsule 800 mg  4 capsule Oral BID Patrecia Pour, MD       mometasone-formoterol [facility]) 200-5 MCG/ACT inhaler 2 puff  2 puff Inhalation BID Zierle-Ghosh, Asia B, DO   2 puff at 09/12/21 0805   senna-docusate (Senokot-S) tablet 1 tablet  1 tablet Oral QHS PRN Zierle-Ghosh, Asia B, DO        SURGICAL HISTORY:  Past Surgical History:  Procedure Laterality Date   APPENDECTOMY     1996   BREAST BIOPSY Right    2010-2015   BRONCHIAL BIOPSY  08/13/2021   Procedure: BRONCHIAL BIOPSIES;  Surgeon: Collene Gobble, MD;  Location: [facility] ENDOSCOPY;  Service: Pulmonary;;   BRONCHIAL BRUSHINGS  08/13/2021   Procedure: BRONCHIAL  BRUSHINGS;  Surgeon: Collene Gobble, MD;  Location: [facility] ENDOSCOPY;  Service: Pulmonary;;   BRONCHIAL NEEDLE ASPIRATION BIOPSY  08/13/2021   Procedure: BRONCHIAL NEEDLE ASPIRATION BIOPSIES;  Surgeon: Collene Gobble, MD;  Location: [facility] ENDOSCOPY;  Service: Pulmonary;;   CESAREAN SECTION     1984, 1987   FOOT SURGERY Right    2 pins and screw 1999   FOOT SURGERY Left 06/21/2021   screw and 2 pins   HEMOSTASIS CONTROL  08/13/2021   Procedure: HEMOSTASIS CONTROL;  Surgeon: Collene Gobble, MD;  Location: [facility] ENDOSCOPY;  Service: Pulmonary;;   MELANOMA EXCISION Left    basal cell melanoma 2011   [place]   VIDEO BRONCHOSCOPY  08/13/2021   Procedure: VIDEO BRONCHOSCOPY WITHOUT FLUORO;  Surgeon: Collene Gobble, MD;  Location[facility] ENDOSCOPY;  Service: Pulmonary;;   VIDEO BRONCHOSCOPY WITH ENDOBRONCHIAL ULTRASOUND N/A 08/13/2021   Procedure: VIDEO BRONCHOSCOPY WITH ENDOBRONCHIAL  ULTRASOUND;  Surgeon: Collene Gobble, MD;  Location: [facility] ENDOSCOPY;  Service: Pulmonary;  Laterality: N/A;    REVIEW OF SYSTEMS:   Review of Systems  Constitutional: Negative for appetite change, chills, fatigue, fever and unexpected weight change.  HENT:   Negative for mouth sores, nosebleeds, sore throat and trouble swallowing.   Eyes: Negative for eye problems and icterus.  Respiratory: Negative for cough, hemoptysis, shortness of breath and wheezing.   Cardiovascular: Negative for chest pain and leg swelling.  Gastrointestinal: Negative for abdominal pain, constipation, diarrhea, nausea and vomiting.  Genitourinary: Negative for bladder incontinence, difficulty urinating, dysuria, frequency and hematuria.   Musculoskeletal: Negative for back pain, gait problem, neck pain and neck stiffness.  Skin: Negative for itching and rash.  Neurological: Negative for dizziness, extremity weakness, gait problem, headaches, light-headedness and seizures.  Hematological: Negative for adenopathy. Does not bruise/bleed easily.  Psychiatric/Behavioral: Negative for confusion, depression and sleep disturbance. The patient is not nervous/anxious.     PHYSICAL EXAMINATION:  There were no vitals taken for this visit.  ECOG PERFORMANCE STATUS: {CHL ONC ECOG [id number]  Physical Exam  Constitutional: Oriented to person, place, and time and well-developed, well-nourished, and in no distress. No distress.  HENT:  Head: Normocephalic and atraumatic.  Mouth/Throat: Oropharynx is clear and moist. No oropharyngeal exudate.  Eyes: Conjunctivae are normal. Right eye exhibits no discharge. Left eye exhibits no discharge. No scleral icterus.  Neck: Normal range of motion. Neck supple.  Cardiovascular: Normal rate, regular rhythm, normal heart sounds and intact distal pulses.   Pulmonary/Chest: Effort normal and breath sounds normal. No respiratory distress. No wheezes. No rales.  Abdominal: Soft. Bowel sounds  are normal. Exhibits no distension and no mass. There is no tenderness.  Musculoskeletal: Normal range of motion. Exhibits no edema.  Lymphadenopathy:    No cervical adenopathy.  Neurological: Alert and oriented to person, place, and time. Exhibits normal muscle tone. Gait normal. Coordination normal.  Skin: Skin is warm and dry. No rash noted. Not diaphoretic. No erythema. No pallor.  Psychiatric: Mood, memory and judgment normal.  Vitals reviewed.  LABORATORY DATA: Lab Results  Component Value Date   WBC 7.1 09/12/2021   HGB 9.2 (L) 09/12/2021   HCT 27.5 (L) 09/12/2021   MCV 95.5 09/12/2021   PLT 131 (L) 09/12/2021      Chemistry      Component Value Date/Time   NA 135 09/12/2021 0340   K 3.5 09/12/2021 0340   CL 103  09/12/2021 0340   CO2 22 09/12/2021 0340   BUN 7 09/12/2021 0340   CREATININE 0.37 (L) 09/12/2021 0340   CREATININE 0.47 09/04/2021 1347      Component Value Date/Time   CALCIUM 8.5 (L) 09/12/2021 0340   ALKPHOS 82 09/12/2021 0340   AST 16 09/12/2021 0340   AST 22 09/04/2021 1347   ALT 16 09/12/2021 0340   ALT 18 09/04/2021 1347   BILITOT 0.7 09/12/2021 0340   BILITOT 0.3 09/04/2021 1347       RADIOGRAPHIC STUDIES:  CT ANGIO HEAD NECK W WO CM  Result Date: 09/11/2021 CLINICAL DATA:  Speech delay and extremity weakness EXAM: CT ANGIOGRAPHY HEAD AND NECK TECHNIQUE: Multidetector CT imaging of the head and neck was performed using the standard protocol during bolus administration of intravenous contrast. Multiplanar CT image reconstructions and MIPs were obtained to evaluate the vascular anatomy. Carotid stenosis measurements (when applicable) are obtained utilizing NASCET criteria, using the distal internal carotid diameter as the denominator. RADIATION DOSE REDUCTION: This exam was performed according to the departmental dose-optimization program which includes automated exposure control, adjustment of the mA and/or kV according to patient size and/or use  of iterative reconstruction technique. CONTRAST:  126mL OMNIPAQUE IOHEXOL 350 MG/ML SOLN COMPARISON:  None Available. FINDINGS: CT HEAD FINDINGS Brain: There is no mass, hemorrhage or extra-axial collection. The size and configuration of the ventricles and extra-axial CSF spaces are normal. There is no acute or chronic infarction. The brain parenchyma is normal. Skull: The visualized skull base, calvarium and extracranial soft tissues are normal. Sinuses/Orbits: No fluid levels or advanced mucosal thickening of the visualized paranasal sinuses. No mastoid or middle ear effusion. The orbits are normal. CTA NECK FINDINGS SKELETON: There is no bony spinal canal stenosis. No lytic or blastic lesion. OTHER NECK: Normal pharynx, larynx and major salivary glands. No cervical lymphadenopathy. Unremarkable thyroid gland. UPPER CHEST: Right hilar mass as previously demonstrated on PET CT of 08/17/2021. AORTIC ARCH: There is calcific atherosclerosis of the aortic arch. There is no aneurysm, dissection or hemodynamically significant stenosis of the visualized portion of the aorta. Conventional 3 vessel aortic branching pattern. The visualized proximal subclavian arteries are widely patent. RIGHT CAROTID SYSTEM: Normal without aneurysm, dissection or stenosis. LEFT CAROTID SYSTEM: Normal without aneurysm, dissection or stenosis. VERTEBRAL ARTERIES: Left dominant configuration. Both origins are clearly patent. There is no dissection, occlusion or flow-limiting stenosis to the skull base (V1-V3 segments). CTA HEAD FINDINGS POSTERIOR CIRCULATION: --Vertebral arteries: Normal V4 segments. --Inferior cerebellar arteries: Normal. --Basilar artery: Normal. --Superior cerebellar arteries: Normal. --Posterior cerebral arteries (PCA): Normal. ANTERIOR CIRCULATION: --Intracranial internal carotid arteries: Normal. --Anterior cerebral arteries (ACA): Normal. Both A1 segments are present. Patent anterior communicating artery (a-comm).  --Middle cerebral arteries (MCA): There is occlusion of a right proximal M2 branch (series 9 image 194). No other intracranial occlusion. 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 VENOUS SINUSES: As permitted by contrast timing, patent. ANATOMIC VARIANTS: None Review of the MIP images confirms the above findings. IMPRESSION: 1. 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 2. 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 3. Right hilar mass as previously demonstrated on PET CT of 08/17/2021. Critical Value/emergent results were called by telephone at the time of interpretation on 09/11/2021 at 10:09 pm to provider Godfrey Pick , who verbally acknowledged these results. Aortic Atherosclerosis (ICD10-I70.0). Electronically Signed   By: Ulyses Jarred M.D.   On: 09/11/2021  22:09   CT Angio Chest PE W and/or Wo Contrast  Result Date: 09/11/2021 CLINICAL DATA:  History of lung carcinoma with shortness of breath and tachycardia EXAM: CT ANGIOGRAPHY CHEST WITH CONTRAST TECHNIQUE: Multidetector CT imaging of the chest was performed using the standard protocol during bolus administration of intravenous contrast. Multiplanar CT image reconstructions and MIPs were obtained to evaluate the vascular anatomy. RADIATION DOSE REDUCTION: This exam was performed according to the departmental dose-optimization program which includes automated exposure control, adjustment of the mA and/or kV according to patient size and/or use of iterative reconstruction technique. CONTRAST:  67mL OMNIPAQUE IOHEXOL 350 MG/ML SOLN COMPARISON:  PET-CT from 08/17/2021 FINDINGS: Cardiovascular: Thoracic aorta demonstrates atherosclerotic calcifications without aneurysmal dilatation or dissection. The heart is at the upper limits of normal in size. No significant coronary calcifications are noted. The pulmonary artery shows a normal branching pattern bilaterally. No definitive  filling defect to suggest pulmonary embolism is noted. Mediastinum/Nodes: Thoracic inlet is within normal limits. No esophageal abnormality is seen. Subcarinal lymph node is noted and stable in appearance measuring 17 mm in short axis. No other mediastinal adenopathy is noted. Small right hilar lymph nodes are noted. Lungs/Pleura: Central right hilar mass is noted with peripheral consolidation stable in appearance from the prior exam. Right upper lobe parenchymal nodule is noted posteriorly similar to that seen on recent PET-CT. No other nodules are noted. No effusion is seen. Upper Abdomen: Adrenal metastatic disease is noted stable from the recent PET-CT. No other focal abnormality is noted. Musculoskeletal: Some healing rib fractures are noted particularly on the right. No definitive lytic or sclerotic lesion is identified to suggest metastatic disease. Review of the MIP images confirms the above findings. IMPRESSION: No evidence of pulmonary emboli. Right hilar mass lesion with peripheral consolidation consistent with the given clinical history of lung carcinoma and stable from the prior PET-CT. Stable posterior right upper lobe nodule is noted as well. Subcarinal lymph node is noted stable from the prior study. Bilateral adrenal metastatic disease is noted stable from prior PET-CT. Aortic Atherosclerosis (ICD10-I70.0). Electronically Signed   By: Inez Catalina M.D.   On: 09/11/2021 23:32   MR BRAIN WO CONTRAST  Result Date: 09/12/2021 CLINICAL DATA:  Stroke follow-up. Weakness and dizziness. History of lung cancer. EXAM: MRI HEAD WITHOUT CONTRAST TECHNIQUE: Multiplanar, multiecho pulse sequences of the brain and surrounding structures were obtained without intravenous contrast. COMPARISON:  Head and neck CTA 09/11/2021.  Head MRI 08/21/2021. FINDINGS: Brain: There are small acute right MCA territory infarcts involving the insula and subinsular white matter, corona radiata, and cortex and subcortical white  matter of the frontal and parietal lobes. There is also a single punctate acute cortical infarct in the posterior left frontal lobe. Scattered small T2 hyperintensities in the cerebral white matter bilaterally are unchanged from the prior MRI and are nonspecific but compatible with mild chronic small vessel ischemic disease. No intracranial hemorrhage, mass, midline shift, or extra-axial fluid collection is identified. The ventricles and sulci are within normal limits for age. Vascular: Major intracranial vascular flow voids are preserved. Skull and upper cervical spine: No suspicious marrow lesion. Sinuses/Orbits: Unremarkable orbits. Clear paranasal sinuses. Small bilateral mastoid effusions. Other: None. IMPRESSION: 1. Small acute right MCA territory infarcts. 2. Punctate acute left frontal lobe infarct. 3. Mild chronic small vessel ischemic disease. Electronically Signed   By: Logan Bores M.D.   On: 09/12/2021 08:34   MR Brain W Wo Contrast  Result Date: 08/21/2021 CLINICAL DATA:  Provided history:  Non-small cell lung cancer, staging. Malignant neoplasm of unspecified part of unspecified bronchus or lung. EXAM: MRI HEAD WITHOUT AND WITH CONTRAST TECHNIQUE: Multiplanar, multiecho pulse sequences of the brain and surrounding structures were obtained without and with intravenous contrast. CONTRAST:  22mL GADAVIST GADOBUTROL 1 MMOL/ML IV SOLN COMPARISON:  PET-CT 08/17/2021 FINDINGS: Brain: No age advanced or lobar predominant parenchymal atrophy. Mild multifocal T2 FLAIR hyperintense signal abnormality within the cerebral white matter, nonspecific but most often secondary to chronic small vessel ischemia. There is no acute infarct. No evidence of an intracranial mass. No chronic intracranial blood products. No extra-axial fluid collection. No midline shift. No pathologic intracranial enhancement identified. Vascular: Maintained flow voids within the proximal large arterial vessels. Skull and upper cervical  spine: No focal suspicious marrow lesion. Incompletely assessed cervical spondylosis. Sinuses/Orbits: Visualized orbits show no acute finding. No significant paranasal sinus disease. Other: Small-volume fluid within the bilateral mastoid air cells. IMPRESSION: No evidence of intracranial metastatic disease. Mild multifocal T2 FLAIR hyperintense signal abnormality within the cerebral white matter, nonspecific but most often secondary to chronic small vessel ischemia. Small-volume fluid within the bilateral mastoid air cells Electronically Signed   By: Kellie Simmering D.O.   On: 08/21/2021 07:48   NM PET Image Initial (PI) Skull Base To Thigh  Result Date: 08/17/2021 CLINICAL DATA:  Initial treatment strategy for non-small cell lung cancer staging. EXAM: NUCLEAR MEDICINE PET SKULL BASE TO THIGH TECHNIQUE: 5.33 mCi F-18 FDG was injected intravenously. Full-ring PET imaging was performed from the skull base to thigh after the radiotracer. CT data was obtained and used for attenuation correction and anatomic localization. Fasting blood glucose: 87 mg/dl COMPARISON:  Comparison made with outside imaging from July 20, 2021. FINDINGS: Mediastinal blood pool activity: SUV max 1.54 Liver activity: SUV max NA NECK: No hypermetabolic lymph nodes in the neck. Incidental CT findings: none CHEST: LEFT hilar and peribronchial soft tissue with marked increased metabolic activity contiguous with subcarinal nodal tissue (images 64-74. Subcarinal nodal tissue up to 17 mm greatest thickness. Indistinct RIGHT hilar soft tissue encases the bronchus intermedius and RIGHT mainstem bronchus and occludes RIGHT upper lobe bronchus while narrowing other bronchi. Partial collapse of the RIGHT upper lobe. This area appears confluent in terms of its metabolic activity with a maximum SUV of 10.0 (image 67/4) Additional small RIGHT upper lobe pulmonary nodule is less distinct than on the study of July 20, 2021 and may represent resolving  inflammation (image 60/4) as this area does not show increased metabolic activity. Airspace disease and volume loss extending peripherally also absent increased metabolic activity on the current study. Incidental CT findings: Calcified aortic atherosclerosis. Normal heart size. Normal caliber of central pulmonary vasculature. Esophagus inseparable from subcarinal adenopathy on noncontrast imaging immediately adjacent to subcarinal adenopathy on previous CT of July 20, 2021. No signs of esophageal obstruction. No additional signs of adenopathy in the chest. Signs of pulmonary emphysema. No sign of effusion. Airways to the LEFT chest are patent. ABDOMEN/PELVIS: Bulky adrenal metastatic disease and upper abdominal nodal disease. LEFT adrenal gland is expanded measuring 3.5 x 3.8 cm. RIGHT adrenal gland similarly, slightly less expanded. Both of these areas are markedly hypermetabolic, LEFT adrenal gland with a maximum SUV of 9.2. Hepatic gastric adenopathy (image 106/4) 11 mm with a maximum SUV of 10.5. Smaller lymph nodes in this area with similar metabolic activity. LEFT periaortic adenopathy (image 117/4) 10 mm with a maximum SUV of 5.5. No solid organ disease elsewhere in the abdomen. No nodal disease  in the pelvis. Incidental CT findings: No acute findings relative to liver, gallbladder, pancreas, spleen, kidneys, urinary bladder, stomach, small or large bowel. Signs of aortic atherosclerosis without aneurysm. Post hysterectomy without signs of adnexal mass. SKELETON: Intramuscular metastatic lesion in the RIGHT erector spinae musculature (image 109/4) corresponding small enhancing focus on the previous CT measuring 10 mm (image 135/2, CT of July 20, 2021) maximum SUV 7.2. No focal bony metastatic process at this time. Incidental CT findings: none IMPRESSION: RIGHT hilar mass narrowing and occluding bronchi of the RIGHT lung with associated mediastinal adenopathy, bilateral adrenal metastases, upper abdominal  nodal metastatic disease and intramuscular metastases. Findings findings compatible with the provided history of metastatic bronchogenic neoplasm. Aortic Atherosclerosis (ICD10-I70.0) and Emphysema (ICD10-J43.9). Electronically Signed   By: Zetta Bills M.D.   On: 08/17/2021 12:04   ECHOCARDIOGRAM COMPLETE  Result Date: 09/12/2021    ECHOCARDIOGRAM REPORT   Patient Name:   KIESHA ENSEY Date of Exam: 09/12/2021 Medical Rec #:  [id number]     Height:       60.0 in Accession #:    [id number]    Weight:       109.1 lb Date of Birth:  17-Dec-1965      BSA:          1.443 m Patient Age:    43 years      BP:           125/81 mmHg Patient Gender: F             HR:           112 bpm. Exam Location:  [facility] Procedure: 2D Echo, Cardiac Doppler and Color Doppler Indications:    Stroke  History:        Patient has no prior history of Echocardiogram examinations.                 COPD and Stroke; Arrythmias:Tachycardia. Rt Lung CA, COVID +.  Sonographer:    Wenda Low Referring Phys: [id number] ASIA B [place]  1. Left ventricular ejection fraction, by estimation, is 60 to 65%. The left ventricle has normal function. The left ventricle has no regional wall motion abnormalities. Left ventricular diastolic parameters were normal.  2. Right ventricular systolic function is normal. The right ventricular size is normal. Tricuspid regurgitation signal is inadequate for assessing PA pressure.  3. The mitral valve is normal in structure. Trivial mitral valve regurgitation. No evidence of mitral stenosis.  4. The aortic valve was not well visualized. Aortic valve regurgitation is not visualized. No aortic stenosis is present.  5. The inferior vena cava is normal in size with greater than 50% respiratory variability, suggesting right atrial pressure of 3 mmHg. FINDINGS  Left Ventricle: Left ventricular ejection fraction, by estimation, is 60 to 65%. The left ventricle has normal function. The left ventricle has no  regional wall motion abnormalities. The left ventricular internal cavity size was normal in size. There is  no left ventricular hypertrophy. Left ventricular diastolic parameters were normal. Right Ventricle: The right ventricular size is normal. No increase in right ventricular wall thickness. Right ventricular systolic function is normal. Tricuspid regurgitation signal is inadequate for assessing PA pressure. Left Atrium: Left atrial size was normal in size. Right Atrium: Right atrial size was normal in size. Pericardium: Trivial pericardial effusion is present. Mitral Valve: The mitral valve is normal in structure. Trivial mitral valve regurgitation. No evidence of mitral valve stenosis. MV peak gradient, 6.2 mmHg. The mean  mitral valve gradient is 2.0 mmHg. Tricuspid Valve: The tricuspid valve is normal in structure. Tricuspid valve regurgitation is trivial. Aortic Valve: The aortic valve was not well visualized. Aortic valve regurgitation is not visualized. No aortic stenosis is present. Aortic valve mean gradient measures 7.0 mmHg. Aortic valve peak gradient measures 13.4 mmHg. Aortic valve area, by VTI measures 1.89 cm. Pulmonic Valve: The pulmonic valve was not well visualized. Pulmonic valve regurgitation is not visualized. Aorta: The aortic root is normal in size and structure. Venous: The inferior vena cava is normal in size with greater than 50% respiratory variability, suggesting right atrial pressure of 3 mmHg. IAS/Shunts: The interatrial septum was not well visualized.  LEFT VENTRICLE PLAX 2D LVIDd:         4.20 cm     Diastology LVIDs:         2.50 cm     LV e' medial:    9.36 cm/s LV PW:         1.00 cm     LV E/e' medial:  8.7 LV IVS:        0.90 cm     LV e' lateral:   12.30 cm/s LVOT diam:     1.80 cm     LV E/e' lateral: 6.6 LV SV:         63 LV SV Index:   44 LVOT Area:     2.54 cm  LV Volumes (MOD) LV vol d, MOD A2C: 31.8 ml LV vol d, MOD A4C: 30.6 ml LV vol s, MOD A2C: 10.7 ml LV vol s, MOD  A4C: 12.1 ml LV SV MOD A2C:     21.1 ml LV SV MOD A4C:     30.6 ml LV SV MOD BP:      19.9 ml RIGHT VENTRICLE RV Basal diam:  2.40 cm RV Mid diam:    2.00 cm RV S prime:     21.80 cm/s TAPSE (M-mode): 2.5 cm LEFT ATRIUM             Index        RIGHT ATRIUM          Index LA diam:        2.80 cm 1.94 cm/m   RA Area:     9.39 cm LA Vol (A2C):   39.2 ml 27.17 ml/m  RA Volume:   18.40 ml 12.75 ml/m LA Vol (A4C):   19.8 ml 13.72 ml/m LA Biplane Vol: 28.4 ml 19.68 ml/m  AORTIC VALVE                     PULMONIC VALVE AV Area (Vmax):    1.96 cm      PV Vmax:       1.06 m/s AV Area (Vmean):   1.90 cm      PV Peak grad:  4.5 mmHg AV Area (VTI):     1.89 cm AV Vmax:           183.00 cm/s AV Vmean:          126.000 cm/s AV VTI:            0.335 m AV Peak Grad:      13.4 mmHg AV Mean Grad:      7.0 mmHg LVOT Vmax:         141.00 cm/s LVOT Vmean:        93.900 cm/s LVOT VTI:  0.249 m LVOT/AV VTI ratio: 0.74  AORTA Ao Root diam: 2.80 cm MITRAL VALVE MV Area (PHT): 8.07 cm     SHUNTS MV Area VTI:   3.04 cm     Systemic VTI:  0.25 m MV Peak grad:  6.2 mmHg     Systemic Diam: 1.80 cm MV Mean grad:  2.0 mmHg MV Vmax:       1.24 m/s MV Vmean:      65.2 cm/s MV Decel Time: 94 msec MV E velocity: 81.40 cm/s MV A velocity: 105.00 cm/s MV E/A ratio:  0.78 Oswaldo Milian MD Electronically signed by Oswaldo Milian MD Signature Date/Time: 09/12/2021/3:23:45 PM    Final      ASSESSMENT/PLAN:  No problem-specific Assessment & Plan notes found for this encounter.   No orders of the defined types were placed in this encounter.    I spent {CHL ONC TIME VISIT - VQQVZ:[id number]} counseling the patient face to face. The total time spent in the appointment was {CHL ONC TIME VISIT - IRJJO:[id number]}.  Dayvon Dax L Akul Leggette, PA-C 09/12/21

## 2021-09-12 NOTE — Assessment & Plan Note (Signed)
Continue Advair 

## 2021-09-12 NOTE — Progress Notes (Signed)
Speech Pathology ? ?Pt passed [facility] swallow screen and SLE requested. Pt with ECHO. SLP will check back as schedule permits for SLE, however Pt symptoms have largely resolved per chart review. ? ?Thank you, ? ?Genene Churn, [place] ?[phone] ? ?

## 2021-09-12 NOTE — Assessment & Plan Note (Signed)
-   Persistently tachycardic even after fluids ?-Heart rate 114-129 ?-Increased risk for PE with cancer ?-CTA shows no evidence of PE ?-BP stable 112/86-131/98 ?-If heart rate sustains above 120 consider intervention ?

## 2021-09-12 NOTE — ED Notes (Signed)
Pt placement called and informed she is being discharged from ED and her bed can be reassigned to another pt ?

## 2021-09-12 NOTE — Assessment & Plan Note (Signed)
-   Albumin 3.4 ?-Encourage nutrient dense food choices ?

## 2021-09-12 NOTE — Progress Notes (Incomplete)
*  PRELIMINARY RESULTS* ?Echocardiogram ?2D Echocardiogram has been performed. ? ?April Walsh ?09/12/2021, 2:11 PM ?

## 2021-09-12 NOTE — Evaluation (Signed)
Occupational Therapy Evaluation ?Patient Details ?Name: April Walsh ?MRN: [id number] ?DOB: October 31, 1965 ?Today's Date: 09/12/2021 ? ? ?History of Present Illness April Walsh is a 56 y.o. female with medical history significant of stage IV lung cancer, COPD, folic acid deficiency, presents to the ED with a chief complaint of difficulty with speech and difficulty with mobility.  Patient reports that her last known normal was 7 PM on the first.  She woke up at 8 AM on the second, and just did not feel right.  Today she noticed she had slurred speech and she had weakness on her left upper and lower extremity.  She denies any numbness or tingling.  She denies any new trouble swallowing.  She does report that she has had dysphagia since her cancer diagnosis.  It would feel like there was a bubble and sharpness going down with her food whenever she swallowed.  Its been improved since she has been treated for cancer.  Patient denies any change in vision or change in hearing.  At the time of my exam her speech is no longer slurred.  She does feel like the symptoms are getting better.  She has no history of stroke.  She has no family history of stroke.  She has no history of A-fib or blood clots to her knowledge.  She has no history of high cholesterol to her knowledge.  Patient does report cough and rash on review of systems.  She reports it is a dry cough that is been present for her entire cancer diagnosis, treatment process she has a rash on her chest that is new.  It started after her last radiation treatment on Tuesday.  It itches and burns.  When she sweats it really burns.  It is located under her bra underwire on both sides of her chest.  She attributed it to her radiation so did not think much of it.  Patient has no other complaints at this time. (Per MD note)  ? ?Clinical Impression ?  ?Pt agreeable to OT and PT co-evaluation. Pt appears to be at or near baseline with independent lower body dressing, bed mobility,  transfers, and ambulation in room. Pt demonstrates WFL B UE strength and A/ROM. Pt is not recommended for further acute OT services and will be discharged to care of nursing staff for remaining length of stay.   ?   ? ?Recommendations for follow up therapy are one component of a multi-disciplinary discharge planning process, led by the attending physician.  Recommendations may be updated based on patient status, additional functional criteria and insurance authorization.  ? ?Follow Up Recommendations ? No OT follow up  ?  ?Assistance Recommended at Discharge PRN  ?Patient can return home with the following   ? ?  ?Functional Status Assessment ? Patient has not had a recent decline in their functional status  ?Equipment Recommendations ? None recommended by OT  ?  ?Recommendations for Other Services   ? ? ?  ?Precautions / Restrictions Precautions ?Precautions: None ?Restrictions ?Weight Bearing Restrictions: No  ? ?  ? ?Mobility Bed Mobility ?Overal bed mobility: Independent ?  ?  ?  ?  ?  ?  ?  ?  ? ?Transfers ?Overall transfer level: Independent ?  ?  ?  ?  ?  ?  ?  ?  ?General transfer comment: EOB to chair and ambulation in room without AD. ?  ? ?  ?Balance Overall balance assessment: Independent ?  ?  ?  ?  ?  ?  ?  ?  ?  ?  ?  ?  ?  ?  ?  ?  ?  ?  ?   ? ?  ADL either performed or assessed with clinical judgement  ? ?ADL Overall ADL's : Independent ?  ?  ?  ?  ?  ?  ?  ?  ?  ?  ?  ?  ?  ?  ?  ?  ?  ?  ?  ?General ADL Comments: Donned and doffed boots at EOB.  ? ? ? ?Vision Baseline Vision/History: 1 Wears glasses ?Ability to See in Adequate Light: 1 Impaired ?Patient Visual Report: No change from baseline ?Vision Assessment?: No apparent visual deficits  ?   ?Perception   ?  ?Praxis   ?  ? ?Pertinent Vitals/Pain Pain Assessment ?Pain Assessment: No/denies pain  ? ? ? ?Hand Dominance Right ?  ?Extremity/Trunk Assessment Upper Extremity Assessment ?Upper Extremity Assessment: Overall WFL for tasks assessed ?   ?Lower Extremity Assessment ?Lower Extremity Assessment: Defer to PT evaluation ?  ?Cervical / Trunk Assessment ?Cervical / Trunk Assessment: Normal ?  ?Communication Communication ?Communication: No difficulties ?  ?Cognition Arousal/Alertness: Awake/alert ?Behavior During Therapy: [facility] for tasks assessed/performed ?Overall Cognitive Status: Within Functional Limits for tasks assessed ?  ?  ?  ?  ?  ?  ?  ?  ?  ?  ?  ?  ?  ?  ?  ?  ?  ?  ?  ?   ? ?  ?   ?  ?    ? ? ?Home Living Family/patient expects to be discharged to:: Private residence ?Living Arrangements: Spouse/significant other;Other (Comment) (May stay with aunt if needed.) ?Available Help at Discharge: Family;Available 24 hours/day ?Type of Home: Mobile home ?Home Access: Stairs to enter ?Entrance Stairs-Number of Steps: 3 ?Entrance Stairs-Rails: Right ?Home Layout: One level ?  ?  ?Bathroom Shower/Tub: Tub/shower unit ?  ?Bathroom Toilet: Standard ?  ?  ?Home Equipment: Kasandra Knudsen - single point ?  ?  ?  ? ?  ?Prior Functioning/Environment Prior Level of Function : Independent/Modified Independent ?  ?  ?  ?  ?  ?  ?Mobility Comments: Independent community ambulator without AD ?ADLs Comments: Independent ADL and IADL's. ?  ? ?  ?  ?OT Problem List:   ?  ?   ?OT Treatment/Interventions:    ?  ?OT Goals(Current goals can be found in the care plan section) Acute Rehab OT Goals ?Patient Stated Goal: return home  ?OT Frequency:   ?  ? ?Co-evaluation PT/OT/SLP Co-Evaluation/Treatment: Yes ?Reason for Co-Treatment: To address functional/ADL transfers ?  ?OT goals addressed during session: ADL's and self-care ?  ? ?  ?AM-PAC OT "6 Clicks" Daily Activity     ?Outcome Measure Help from another person eating meals?: None ?Help from another person taking care of personal grooming?: None ?Help from another person toileting, which includes using toliet, bedpan, or urinal?: None ?Help from another person bathing (including washing, rinsing, drying)?: None ?Help from another  person to put on and taking off regular upper body clothing?: None ?Help from another person to put on and taking off regular lower body clothing?: None ?6 Click Score: 24 ?  ?End of Session   ? ?Activity Tolerance: Patient tolerated treatment well ?Patient left: in bed;with call bell/phone within reach ? 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              ?Time: 7741-2878 ?OT Time Calculation (  min): 13 min ?Charges:  OT General Charges ?$OT Visit: 1 Visit ?OT Evaluation ?$OT Eval Low Complexity: 1 Low ? ?Larey Seat OT, MOT ? ?Larey Seat ?09/12/2021, 9:40 AM ?

## 2021-09-18 ENCOUNTER — Ambulatory Visit: Payer: BC Managed Care – PPO

## 2021-09-18 ENCOUNTER — Ambulatory Visit: Payer: BC Managed Care – PPO | Admitting: Physician Assistant

## 2021-09-18 ENCOUNTER — Other Ambulatory Visit: Payer: BC Managed Care – PPO

## 2021-09-20 ENCOUNTER — Encounter (HOSPITAL_COMMUNITY): Payer: Self-pay

## 2021-09-25 ENCOUNTER — Inpatient Hospital Stay: Payer: BC Managed Care – PPO

## 2021-09-25 NOTE — Progress Notes (Unsigned)
[place] OFFICE PROGRESS NOTE  [name], FNP [address] [place] [address]  DIAGNOSIS: Stage IV (T2b, N2, M1 C) non-small cell lung cancer, adenocarcinoma presented with right hilar mass in addition to mediastinal and upper abdominal lymphadenopathy in addition to bilateral adrenal metastases and metastatic disease in the musculature of the lower back.  This was diagnosed in April 2023.   DETECTED ALTERATION(S) / BIOMARKER(S)     % CFDNA OR AMPLIFICATION        ASSOCIATED FDA-APPROVED THERAPIES         CLINICAL TRIAL AVAILABILITY [id number] 0.5% None     Yes [id number] (Splice Site Indel) 5.6% None     Yes   PD-L1 expression 0%  PRIOR THERAPY:  Palliative radiotherapy to the right hilar region under the care of Dr. Lisbeth Renshaw.  CURRENT THERAPY:  Systemic chemotherapy with carboplatin for AUC of 5, Alimta 500 Mg/M2 and Keytruda 200 Mg IV every 3 weeks.  First dose August 29, 2021. Status post   INTERVAL HISTORY: April Walsh 56 y.o. female returns to the clinic today for follow-up visit.  The patient was unfortunately recently diagnosed with stage IV non-small cell lung cancer, adenocarcinoma.  The patient underwent 1 cycle of chemotherapy.  In the interval since last being seen, the patient tested positive for COVID-19.  She also presented to the emergency room on 09/21/2021 for speech difficulty and difficulties with mobility in the left upper extremity/weakness.  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  It was suspected that this was embolic in nature given the bilateral cortical infarcts.  She is following with neurology outpatient and has an appointment on ***.    Overall, the patient is feeling fair today except she has shortness of breath with exertion and at rest. She reports that she feels like she is not able to get a deep breath.***She completed pallaitive radiation to the right hilar region.  Denies any fever,  chills, or night sweats.  For cough, the patient will take Delsym every 12 hours.  She has not had any hemoptysis recently. Denies any nausea, vomiting, diarrhea, constipation, headaches, or visual changes.  She had a CTA performed in the hospital which was negative for PE and noted stable known disease in the chest.  Still smoking but working on cessation she is here today for evaluation and repeat blood work before considering starting cycle #2.    MEDICAL HISTORY: Past Medical History:  Diagnosis Date   Anemia    Asthma    Bronchitis    Cancer ([place])    basal cell melanoma, cervical cancer   COPD (chronic obstructive pulmonary disease) ([place])    "early stages of COPD"   Headache    Lung cancer ([place]) 08/13/2021    ALLERGIES:  has No Known Allergies.  MEDICATIONS:  Current Outpatient Medications  Medication Sig Dispense Refill   ADVAIR HFA 115-21 MCG/ACT inhaler Inhale 2 puffs into the lungs 2 (two) times daily.     apixaban (ELIQUIS) 2.5 MG TABS tablet Take 1 tablet (2.5 mg total) by mouth 2 (two) times daily. 60 tablet 0   atorvastatin (LIPITOR) 40 MG tablet Take 1 tablet (40 mg total) by mouth daily. 30 tablet 0   folic acid (FOLVITE) 1 MG tablet Take 1 tablet (1 mg total) by mouth daily. 30 tablet 2   prochlorperazine (COMPAZINE) 10 MG tablet Take 1 tablet (10 mg total) by mouth every 6 (six) hours  as needed. 30 tablet 2   No current facility-administered medications for this visit.    SURGICAL HISTORY:  Past Surgical History:  Procedure Laterality Date   APPENDECTOMY     1996   BREAST BIOPSY Right    2010-2015   BRONCHIAL BIOPSY  08/13/2021   Procedure: BRONCHIAL BIOPSIES;  Surgeon: Collene Gobble, MD;  Location: [facility] ENDOSCOPY;  Service: Pulmonary;;   BRONCHIAL BRUSHINGS  08/13/2021   Procedure: BRONCHIAL BRUSHINGS;  Surgeon: Collene Gobble, MD;  Location[facility] ENDOSCOPY;  Service: Pulmonary;;   BRONCHIAL NEEDLE ASPIRATION BIOPSY  08/13/2021   Procedure: BRONCHIAL NEEDLE  ASPIRATION BIOPSIES;  Surgeon: Collene Gobble, MD;  Location: [facility] ENDOSCOPY;  Service: Pulmonary;;   CESAREAN [place] Right    2 pins and screw 1999   FOOT SURGERY Left 06/21/2021   screw and 2 pins   HEMOSTASIS CONTROL  08/13/2021   Procedure: HEMOSTASIS CONTROL;  Surgeon: Collene Gobble, MD;  Location: [facility] ENDOSCOPY;  Service: Pulmonary;;   MELANOMA EXCISION Left    basal cell melanoma 2011   [place]   VIDEO BRONCHOSCOPY  08/13/2021   Procedure: VIDEO BRONCHOSCOPY WITHOUT FLUORO;  Surgeon: Collene Gobble, MD;  Location: [facility] ENDOSCOPY;  Service: Pulmonary;;   VIDEO BRONCHOSCOPY WITH ENDOBRONCHIAL ULTRASOUND N/A 08/13/2021   Procedure: VIDEO BRONCHOSCOPY WITH ENDOBRONCHIAL ULTRASOUND;  Surgeon: Collene Gobble, MD;  Location: [facility] ENDOSCOPY;  Service: Pulmonary;  Laterality: N/A;    REVIEW OF SYSTEMS:   Review of Systems  Constitutional: Negative for appetite change, chills, fatigue, fever and unexpected weight change.  HENT:   Negative for mouth sores, nosebleeds, sore throat and trouble swallowing.   Eyes: Negative for eye problems and icterus.  Respiratory: Negative for cough, hemoptysis, shortness of breath and wheezing.   Cardiovascular: Negative for chest pain and leg swelling.  Gastrointestinal: Negative for abdominal pain, constipation, diarrhea, nausea and vomiting.  Genitourinary: Negative for bladder incontinence, difficulty urinating, dysuria, frequency and hematuria.   Musculoskeletal: Negative for back pain, gait problem, neck pain and neck stiffness.  Skin: Negative for itching and rash.  Neurological: Negative for dizziness, extremity weakness, gait problem, headaches, light-headedness and seizures.  Hematological: Negative for adenopathy. Does not bruise/bleed easily.  Psychiatric/Behavioral: Negative for confusion, depression and sleep disturbance. The patient is not nervous/anxious.      PHYSICAL EXAMINATION:  There were no vitals taken for this visit.  ECOG PERFORMANCE STATUS: {CHL ONC ECOG [id number]  Physical Exam  Constitutional: Oriented to person, place, and time and well-developed, well-nourished, and in no distress. No distress.  HENT:  Head: Normocephalic and atraumatic.  Mouth/Throat: Oropharynx is clear and moist. No oropharyngeal exudate.  Eyes: Conjunctivae are normal. Right eye exhibits no discharge. Left eye exhibits no discharge. No scleral icterus.  Neck: Normal range of motion. Neck supple.  Cardiovascular: Normal rate, regular rhythm, normal heart sounds and intact distal pulses.   Pulmonary/Chest: Effort normal and breath sounds normal. No respiratory distress. No wheezes. No rales.  Abdominal: Soft. Bowel sounds are normal. Exhibits no distension and no mass. There is no tenderness.  Musculoskeletal: Normal range of motion. Exhibits no edema.  Lymphadenopathy:    No cervical adenopathy.  Neurological: Alert and oriented to person, place, and time. Exhibits normal muscle tone. Gait normal. Coordination normal.  Skin: Skin is warm and dry. No rash noted. Not diaphoretic. No erythema. No  pallor.  Psychiatric: Mood, memory and judgment normal.  Vitals reviewed.  LABORATORY DATA: Lab Results  Component Value Date   WBC 7.1 09/12/2021   HGB 9.2 (L) 09/12/2021   HCT 27.5 (L) 09/12/2021   MCV 95.5 09/12/2021   PLT 131 (L) 09/12/2021      Chemistry      Component Value Date/Time   NA 135 09/12/2021 0340   K 3.5 09/12/2021 0340   CL 103 09/12/2021 0340   CO2 22 09/12/2021 0340   BUN 7 09/12/2021 0340   CREATININE 0.37 (L) 09/12/2021 0340   CREATININE 0.47 09/04/2021 1347      Component Value Date/Time   CALCIUM 8.5 (L) 09/12/2021 0340   ALKPHOS 82 09/12/2021 0340   AST 16 09/12/2021 0340   AST 22 09/04/2021 1347   ALT 16 09/12/2021 0340   ALT 18 09/04/2021 1347   BILITOT 0.7 09/12/2021 0340   BILITOT 0.3 09/04/2021 1347        RADIOGRAPHIC STUDIES:  CT ANGIO HEAD NECK W WO CM  Result Date: 09/11/2021 CLINICAL DATA:  Speech delay and extremity weakness EXAM: CT ANGIOGRAPHY HEAD AND NECK TECHNIQUE: Multidetector CT imaging of the head and neck was performed using the standard protocol during bolus administration of intravenous contrast. Multiplanar CT image reconstructions and MIPs were obtained to evaluate the vascular anatomy. Carotid stenosis measurements (when applicable) are obtained utilizing NASCET criteria, using the distal internal carotid diameter as the denominator. RADIATION DOSE REDUCTION: This exam was performed according to the departmental dose-optimization program which includes automated exposure control, adjustment of the mA and/or kV according to patient size and/or use of iterative reconstruction technique. CONTRAST:  148m OMNIPAQUE IOHEXOL 350 MG/ML SOLN COMPARISON:  None Available. FINDINGS: CT HEAD FINDINGS Brain: There is no mass, hemorrhage or extra-axial collection. The size and configuration of the ventricles and extra-axial CSF spaces are normal. There is no acute or chronic infarction. The brain parenchyma is normal. Skull: The visualized skull base, calvarium and extracranial soft tissues are normal. Sinuses/Orbits: No fluid levels or advanced mucosal thickening of the visualized paranasal sinuses. No mastoid or middle ear effusion. The orbits are normal. CTA NECK FINDINGS SKELETON: There is no bony spinal canal stenosis. No lytic or blastic lesion. OTHER NECK: Normal pharynx, larynx and major salivary glands. No cervical lymphadenopathy. Unremarkable thyroid gland. UPPER CHEST: Right hilar mass as previously demonstrated on PET CT of 08/17/2021. AORTIC ARCH: There is calcific atherosclerosis of the aortic arch. There is no aneurysm, dissection or hemodynamically significant stenosis of the visualized portion of the aorta. Conventional 3 vessel aortic branching pattern. The visualized proximal  subclavian arteries are widely patent. RIGHT CAROTID SYSTEM: Normal without aneurysm, dissection or stenosis. LEFT CAROTID SYSTEM: Normal without aneurysm, dissection or stenosis. VERTEBRAL ARTERIES: Left dominant configuration. Both origins are clearly patent. There is no dissection, occlusion or flow-limiting stenosis to the skull base (V1-V3 segments). CTA HEAD FINDINGS POSTERIOR CIRCULATION: --Vertebral arteries: Normal V4 segments. --Inferior cerebellar arteries: Normal. --Basilar artery: Normal. --Superior cerebellar arteries: Normal. --Posterior cerebral arteries (PCA): Normal. ANTERIOR CIRCULATION: --Intracranial internal carotid arteries: Normal. --Anterior cerebral arteries (ACA): Normal. Both A1 segments are present. Patent anterior communicating artery (a-comm). --Middle cerebral arteries (MCA): There is occlusion of a right proximal M2 branch (series 9 image 194). No other intracranial occlusion. 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 VENOUS SINUSES: As permitted by contrast timing, patent. ANATOMIC VARIANTS: None Review of the MIP images confirms the above findings.  IMPRESSION: 1. 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 2. 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 3. Right hilar mass as previously demonstrated on PET CT of 08/17/2021. Critical Value/emergent results were called by telephone at the time of interpretation on 09/11/2021 at 10:09 pm to provider Godfrey Pick , who verbally acknowledged these results. Aortic Atherosclerosis (ICD10-I70.0). Electronically Signed   By: Ulyses Jarred M.D.   On: 09/11/2021 22:09   CT Angio Chest PE W and/or Wo Contrast  Result Date: 09/11/2021 CLINICAL DATA:  History of lung carcinoma with shortness of breath and tachycardia EXAM: CT ANGIOGRAPHY CHEST WITH CONTRAST TECHNIQUE: Multidetector CT imaging of the chest was performed using the standard protocol during bolus  administration of intravenous contrast. Multiplanar CT image reconstructions and MIPs were obtained to evaluate the vascular anatomy. RADIATION DOSE REDUCTION: This exam was performed according to the departmental dose-optimization program which includes automated exposure control, adjustment of the mA and/or kV according to patient size and/or use of iterative reconstruction technique. CONTRAST:  78m OMNIPAQUE IOHEXOL 350 MG/ML SOLN COMPARISON:  PET-CT from 08/17/2021 FINDINGS: Cardiovascular: Thoracic aorta demonstrates atherosclerotic calcifications without aneurysmal dilatation or dissection. The heart is at the upper limits of normal in size. No significant coronary calcifications are noted. The pulmonary artery shows a normal branching pattern bilaterally. No definitive filling defect to suggest pulmonary embolism is noted. Mediastinum/Nodes: Thoracic inlet is within normal limits. No esophageal abnormality is seen. Subcarinal lymph node is noted and stable in appearance measuring 17 mm in short axis. No other mediastinal adenopathy is noted. Small right hilar lymph nodes are noted. Lungs/Pleura: Central right hilar mass is noted with peripheral consolidation stable in appearance from the prior exam. Right upper lobe parenchymal nodule is noted posteriorly similar to that seen on recent PET-CT. No other nodules are noted. No effusion is seen. Upper Abdomen: Adrenal metastatic disease is noted stable from the recent PET-CT. No other focal abnormality is noted. Musculoskeletal: Some healing rib fractures are noted particularly on the right. No definitive lytic or sclerotic lesion is identified to suggest metastatic disease. Review of the MIP images confirms the above findings. IMPRESSION: No evidence of pulmonary emboli. Right hilar mass lesion with peripheral consolidation consistent with the given clinical history of lung carcinoma and stable from the prior PET-CT. Stable posterior right upper lobe nodule is  noted as well. Subcarinal lymph node is noted stable from the prior study. Bilateral adrenal metastatic disease is noted stable from prior PET-CT. Aortic Atherosclerosis (ICD10-I70.0). Electronically Signed   By: MInez CatalinaM.D.   On: 09/11/2021 23:32   MR BRAIN WO CONTRAST  Result Date: 09/12/2021 CLINICAL DATA:  Stroke follow-up. Weakness and dizziness. History of lung cancer. EXAM: MRI HEAD WITHOUT CONTRAST TECHNIQUE: Multiplanar, multiecho pulse sequences of the brain and surrounding structures were obtained without intravenous contrast. COMPARISON:  Head and neck CTA 09/11/2021.  Head MRI 08/21/2021. FINDINGS: Brain: There are small acute right MCA territory infarcts involving the insula and subinsular white matter, corona radiata, and cortex and subcortical white matter of the frontal and parietal lobes. There is also a single punctate acute cortical infarct in the posterior left frontal lobe. Scattered small T2 hyperintensities in the cerebral white matter bilaterally are unchanged from the prior MRI and are nonspecific but compatible with mild chronic small vessel ischemic disease. No intracranial hemorrhage, mass, midline shift, or extra-axial fluid collection is identified. The ventricles and sulci are within normal limits for age. Vascular: Major intracranial  vascular flow voids are preserved. Skull and upper cervical spine: No suspicious marrow lesion. Sinuses/Orbits: Unremarkable orbits. Clear paranasal sinuses. Small bilateral mastoid effusions. Other: None. IMPRESSION: 1. Small acute right MCA territory infarcts. 2. Punctate acute left frontal lobe infarct. 3. Mild chronic small vessel ischemic disease. Electronically Signed   By: Logan Bores M.D.   On: 09/12/2021 08:34   ECHOCARDIOGRAM COMPLETE  Result Date: 09/12/2021    ECHOCARDIOGRAM REPORT   Patient Name:   April Walsh Date of Exam: 09/12/2021 Medical Rec #:  [id number]     Height:       60.0 in Accession #:    [id number]    Weight:        109.1 lb Date of Birth:  12-28-1965      BSA:          1.443 m Patient Age:    51 years      BP:           125/81 mmHg Patient Gender: F             HR:           112 bpm. Exam Location:  [facility] Procedure: 2D Echo, Cardiac Doppler and Color Doppler Indications:    Stroke  History:        Patient has no prior history of Echocardiogram examinations.                 COPD and Stroke; Arrythmias:Tachycardia. Rt Lung CA, COVID +.  Sonographer:    Wenda Low Referring Phys: [id number] ASIA B [place]  1. Left ventricular ejection fraction, by estimation, is 60 to 65%. The left ventricle has normal function. The left ventricle has no regional wall motion abnormalities. Left ventricular diastolic parameters were normal.  2. Right ventricular systolic function is normal. The right ventricular size is normal. Tricuspid regurgitation signal is inadequate for assessing PA pressure.  3. The mitral valve is normal in structure. Trivial mitral valve regurgitation. No evidence of mitral stenosis.  4. The aortic valve was not well visualized. Aortic valve regurgitation is not visualized. No aortic stenosis is present.  5. The inferior vena cava is normal in size with greater than 50% respiratory variability, suggesting right atrial pressure of 3 mmHg. FINDINGS  Left Ventricle: Left ventricular ejection fraction, by estimation, is 60 to 65%. The left ventricle has normal function. The left ventricle has no regional wall motion abnormalities. The left ventricular internal cavity size was normal in size. There is  no left ventricular hypertrophy. Left ventricular diastolic parameters were normal. Right Ventricle: The right ventricular size is normal. No increase in right ventricular wall thickness. Right ventricular systolic function is normal. Tricuspid regurgitation signal is inadequate for assessing PA pressure. Left Atrium: Left atrial size was normal in size. Right Atrium: Right atrial size was normal in size.  Pericardium: Trivial pericardial effusion is present. Mitral Valve: The mitral valve is normal in structure. Trivial mitral valve regurgitation. No evidence of mitral valve stenosis. MV peak gradient, 6.2 mmHg. The mean mitral valve gradient is 2.0 mmHg. Tricuspid Valve: The tricuspid valve is normal in structure. Tricuspid valve regurgitation is trivial. Aortic Valve: The aortic valve was not well visualized. Aortic valve regurgitation is not visualized. No aortic stenosis is present. Aortic valve mean gradient measures 7.0 mmHg. Aortic valve peak gradient measures 13.4 mmHg. Aortic valve area, by VTI measures 1.89 cm. Pulmonic Valve: The pulmonic valve was not well visualized. Pulmonic valve regurgitation is not visualized.  Aorta: The aortic root is normal in size and structure. Venous: The inferior vena cava is normal in size with greater than 50% respiratory variability, suggesting right atrial pressure of 3 mmHg. IAS/Shunts: The interatrial septum was not well visualized.  LEFT VENTRICLE PLAX 2D LVIDd:         4.20 cm     Diastology LVIDs:         2.50 cm     LV e' medial:    9.36 cm/s LV PW:         1.00 cm     LV E/e' medial:  8.7 LV IVS:        0.90 cm     LV e' lateral:   12.30 cm/s LVOT diam:     1.80 cm     LV E/e' lateral: 6.6 LV SV:         63 LV SV Index:   44 LVOT Area:     2.54 cm  LV Volumes (MOD) LV vol d, MOD A2C: 31.8 ml LV vol d, MOD A4C: 30.6 ml LV vol s, MOD A2C: 10.7 ml LV vol s, MOD A4C: 12.1 ml LV SV MOD A2C:     21.1 ml LV SV MOD A4C:     30.6 ml LV SV MOD BP:      19.9 ml RIGHT VENTRICLE RV Basal diam:  2.40 cm RV Mid diam:    2.00 cm RV S prime:     21.80 cm/s TAPSE (M-mode): 2.5 cm LEFT ATRIUM             Index        RIGHT ATRIUM          Index LA diam:        2.80 cm 1.94 cm/m   RA Area:     9.39 cm LA Vol (A2C):   39.2 ml 27.17 ml/m  RA Volume:   18.40 ml 12.75 ml/m LA Vol (A4C):   19.8 ml 13.72 ml/m LA Biplane Vol: 28.4 ml 19.68 ml/m  AORTIC VALVE                      PULMONIC VALVE AV Area (Vmax):    1.96 cm      PV Vmax:       1.06 m/s AV Area (Vmean):   1.90 cm      PV Peak grad:  4.5 mmHg AV Area (VTI):     1.89 cm AV Vmax:           183.00 cm/s AV Vmean:          126.000 cm/s AV VTI:            0.335 m AV Peak Grad:      13.4 mmHg AV Mean Grad:      7.0 mmHg LVOT Vmax:         141.00 cm/s LVOT Vmean:        93.900 cm/s LVOT VTI:          0.249 m LVOT/AV VTI ratio: 0.74  AORTA Ao Root diam: 2.80 cm MITRAL VALVE MV Area (PHT): 8.07 cm     SHUNTS MV Area VTI:   3.04 cm     Systemic VTI:  0.25 m MV Peak grad:  6.2 mmHg     Systemic Diam: 1.80 cm MV Mean grad:  2.0 mmHg MV Vmax:       1.24 m/s MV Vmean:  65.2 cm/s MV Decel Time: 94 msec MV E velocity: 81.40 cm/s MV A velocity: 105.00 cm/s MV E/A ratio:  0.78 Oswaldo Milian MD Electronically signed by Oswaldo Milian MD Signature Date/Time: 09/12/2021/3:23:45 PM    Final      ASSESSMENT/PLAN:   This is a very pleasant 56 years old white female with Stage IV (T2b, N2, M1 C) non-small cell lung cancer, adenocarcinoma presented with right hilar mass in addition to mediastinal and upper abdominal lymphadenopathy in addition to bilateral adrenal metastases and metastatic disease in the musculature of the lower back.  This was diagnosed in April 2023.  The molecular studies by [employer] showed no actionable mutations and the patient has negative PD-L1 expression. She is currently undergoing palliative radiotherapy to the right hilar region under the care of Dr. Lisbeth Renshaw.  She completed palliative radiation to the right hilar area under the care of Dr. Lisbeth Renshaw.  She is currently undergoing palliative systemic chemotherapy with carboplatin for an AUC of 5, Alimta 500 mg per metered squared, Keytruda 200 mg IV every 3 weeks.  She is status post 1 cycle.  Her first dose was on/19/2023.  In the interval since last being seen, the patient tested positive for COVID-19.  She also had acute infarcts in the right MCA  and left frontal region.  She is following with neurology and has an appointment on ***.   The patient was seen with Dr. Julien Nordmann today.  Labs were reviewed.  Recommend that she ***with cycle #2 today's schedule.  We will see her back for follow-up visit in 3 weeks for evaluation and repeat blood work before starting cycle #3.  She will continue to use Delsym for cough.  She was strongly encouraged to quit smoking.  The patient has a lot of phlegm on exam.  Advised to use Mucinex and her inhalers if needed for shortness of breath.  The patient is not in any acute distress and she is 100% on room air.   The patient was advised to call immediately if she has any concerning symptoms in the interval. The patient voices understanding of current disease status and treatment options and is in agreement with the current care plan. All questions were answered. The patient knows to call the clinic with any problems, questions or concerns. We can certainly see the patient much sooner if necessary    No orders of the defined types were placed in this encounter.    I spent {CHL ONC TIME VISIT - XYVOP:[id number]} counseling the patient face to face. The total time spent in the appointment was {CHL ONC TIME VISIT - NOTRR:[id number]}.  Dylan Monforte L Rich Paprocki, PA-C 09/25/21

## 2021-09-26 ENCOUNTER — Encounter: Payer: Self-pay | Admitting: Internal Medicine

## 2021-09-26 NOTE — Progress Notes (Signed)
?                                                                                                                                                          ?  Patient Name: April Walsh ?MRN: [id number] ?DOB: [birth year] ?Referring Physician: Patrecia Pace ?Date of Service: 09/04/2021 ?[facility], [place] ? ?                                                      End Of Treatment Note ? ?Diagnoses: C34.11-Malignant neoplasm of upper lobe, right bronchus or lung ? ?Cancer Staging:  Stage IV cT2bN2M1c NSCLC, adenocarcinoma presented with right hilar mass in addition to mediastinal and upper abdominal lymphadenopathy in addition to bilateral adrenal metastases and metastatic disease in the musculature of the lower back ? ?Intent: Palliative ? ?Radiation Treatment Dates: 08/22/2021 through 09/04/2021 ?Site Technique Total Dose (Gy) Dose per Fx (Gy) Completed Fx Beam Energies  ?Lung, Right: Lung_R 3D 30/30 3 10/10 6X, 10X  ? ?Narrative: The patient tolerated radiation therapy relatively well. She developed fatigue and improvement in her breathing occurred during radiation. ? ?Plan: The patient will receive a call in about one month from the radiation oncology department. She will continue follow up with Dr. Julien Nordmann as well.  ? ?________________________________________________ ? ? ? ?Carola Rhine, PAC  ?

## 2021-10-02 ENCOUNTER — Other Ambulatory Visit: Payer: BC Managed Care – PPO

## 2021-10-02 ENCOUNTER — Other Ambulatory Visit: Payer: Self-pay | Admitting: Physician Assistant

## 2021-10-02 ENCOUNTER — Inpatient Hospital Stay: Payer: BC Managed Care – PPO | Attending: Radiation Oncology | Admitting: Physician Assistant

## 2021-10-02 ENCOUNTER — Other Ambulatory Visit: Payer: Self-pay

## 2021-10-02 ENCOUNTER — Ambulatory Visit: Payer: BC Managed Care – PPO

## 2021-10-02 ENCOUNTER — Inpatient Hospital Stay: Payer: BC Managed Care – PPO

## 2021-10-02 ENCOUNTER — Encounter: Payer: Self-pay | Admitting: Physician Assistant

## 2021-10-02 VITALS — BP 84/64 | HR 99 | Temp 97.9°F | Resp 18 | Ht 60.0 in | Wt 97.9 lb

## 2021-10-02 VITALS — BP 81/61 | HR 87

## 2021-10-02 DIAGNOSIS — I959 Hypotension, unspecified: Secondary | ICD-10-CM | POA: Insufficient documentation

## 2021-10-02 DIAGNOSIS — Z5111 Encounter for antineoplastic chemotherapy: Secondary | ICD-10-CM | POA: Diagnosis not present

## 2021-10-02 DIAGNOSIS — I63511 Cerebral infarction due to unspecified occlusion or stenosis of right middle cerebral artery: Secondary | ICD-10-CM | POA: Insufficient documentation

## 2021-10-02 DIAGNOSIS — Z5112 Encounter for antineoplastic immunotherapy: Secondary | ICD-10-CM | POA: Diagnosis not present

## 2021-10-02 DIAGNOSIS — C3491 Malignant neoplasm of unspecified part of right bronchus or lung: Secondary | ICD-10-CM | POA: Insufficient documentation

## 2021-10-02 DIAGNOSIS — Z7901 Long term (current) use of anticoagulants: Secondary | ICD-10-CM | POA: Diagnosis not present

## 2021-10-02 DIAGNOSIS — C3401 Malignant neoplasm of right main bronchus: Secondary | ICD-10-CM

## 2021-10-02 LAB — CBC WITH DIFFERENTIAL (CANCER CENTER ONLY)
Abs Immature Granulocytes: 0.04 10*3/uL (ref 0.00–0.07)
Basophils Absolute: 0.1 10*3/uL (ref 0.0–0.1)
Basophils Relative: 1 %
Eosinophils Absolute: 0.2 10*3/uL (ref 0.0–0.5)
Eosinophils Relative: 4 %
HCT: 35.3 % — ABNORMAL LOW (ref 36.0–46.0)
Hemoglobin: 11.8 g/dL — ABNORMAL LOW (ref 12.0–15.0)
Immature Granulocytes: 1 %
Lymphocytes Relative: 26 %
Lymphs Abs: 1.5 10*3/uL (ref 0.7–4.0)
MCH: 31.6 pg (ref 26.0–34.0)
MCHC: 33.4 g/dL (ref 30.0–36.0)
MCV: 94.4 fL (ref 80.0–100.0)
Monocytes Absolute: 0.9 10*3/uL (ref 0.1–1.0)
Monocytes Relative: 15 %
Neutro Abs: 3.1 10*3/uL (ref 1.7–7.7)
Neutrophils Relative %: 53 %
Platelet Count: 275 10*3/uL (ref 150–400)
RBC: 3.74 MIL/uL — ABNORMAL LOW (ref 3.87–5.11)
RDW: 15.9 % — ABNORMAL HIGH (ref 11.5–15.5)
WBC Count: 5.7 10*3/uL (ref 4.0–10.5)
nRBC: 0 % (ref 0.0–0.2)

## 2021-10-02 LAB — CMP (CANCER CENTER ONLY)
ALT: 7 U/L (ref 0–44)
AST: 12 U/L — ABNORMAL LOW (ref 15–41)
Albumin: 3.9 g/dL (ref 3.5–5.0)
Alkaline Phosphatase: 79 U/L (ref 38–126)
Anion gap: 6 (ref 5–15)
BUN: 17 mg/dL (ref 6–20)
CO2: 27 mmol/L (ref 22–32)
Calcium: 9.8 mg/dL (ref 8.9–10.3)
Chloride: 103 mmol/L (ref 98–111)
Creatinine: 0.53 mg/dL (ref 0.44–1.00)
GFR, Estimated: >60 mL/min (ref >60)
Glucose, Bld: 92 mg/dL (ref 70–99)
Potassium: 4.1 mmol/L (ref 3.5–5.1)
Sodium: 136 mmol/L (ref 135–145)
Total Bilirubin: 0.3 mg/dL (ref 0.3–1.2)
Total Protein: 7.6 g/dL (ref 6.5–8.1)

## 2021-10-02 LAB — TSH: TSH: 1.318 u[IU]/mL (ref 0.350–4.500)

## 2021-10-02 MED ORDER — SODIUM CHLORIDE 0.9 % IV SOLN: Freq: Once | INTRAVENOUS | Status: AC

## 2021-10-02 MED ORDER — SODIUM CHLORIDE 0.9 % IV SOLN
500.0000 mg/m2 | Freq: Once | INTRAVENOUS | Status: AC
  Administered 2021-10-02: 700 mg via INTRAVENOUS
  Filled 2021-10-02: qty 20

## 2021-10-02 MED ORDER — SODIUM CHLORIDE 0.9 % IV SOLN
10.0000 mg | Freq: Once | INTRAVENOUS | Status: AC
  Administered 2021-10-02: 10 mg via INTRAVENOUS
  Filled 2021-10-02: qty 10

## 2021-10-02 MED ORDER — PALONOSETRON HCL INJECTION 0.25 MG/5ML
0.2500 mg | Freq: Once | INTRAVENOUS | Status: AC
  Administered 2021-10-02: 0.25 mg via INTRAVENOUS
  Filled 2021-10-02: qty 5

## 2021-10-02 MED ORDER — SODIUM CHLORIDE 0.9 % IV SOLN
150.0000 mg | Freq: Once | INTRAVENOUS | Status: AC
  Administered 2021-10-02: 150 mg via INTRAVENOUS
  Filled 2021-10-02: qty 150

## 2021-10-02 MED ORDER — SODIUM CHLORIDE 0.9 % IV SOLN
429.5000 mg | Freq: Once | INTRAVENOUS | Status: AC
  Administered 2021-10-02: 430 mg via INTRAVENOUS
  Filled 2021-10-02: qty 43

## 2021-10-02 MED ORDER — SODIUM CHLORIDE 0.9 % IV SOLN
200.0000 mg | Freq: Once | INTRAVENOUS | Status: AC
  Administered 2021-10-02: 200 mg via INTRAVENOUS
  Filled 2021-10-02: qty 200

## 2021-10-02 NOTE — Progress Notes (Signed)
Ok to treat with blood pressure, fluids orders. Per [place], [place]

## 2021-10-02 NOTE — Patient Instructions (Signed)
[facility]  Discharge Instructions: Thank you for choosing [facility] to provide your oncology and hematology care.   If you have a lab appointment with the [facility], please go directly to the [facility] and check in at the registration area.   Wear comfortable clothing and clothing appropriate for easy access to any Portacath or PICC line.   We strive to give you quality time with your provider. You may need to reschedule your appointment if you arrive late (15 or more minutes).  Arriving late affects you and other patients whose appointments are after yours.  Also, if you miss three or more appointments without notifying the office, you may be dismissed from the clinic at the provider's discretion.      For prescription refill requests, have your pharmacy contact our office and allow 72 hours for refills to be completed.    Today you received the following chemotherapy and/or immunotherapy agents: Pembrolizumab, Pemetrexed, Carboplatin      To help prevent nausea and vomiting after your treatment, we encourage you to take your nausea medication as directed.  BELOW ARE SYMPTOMS THAT SHOULD BE REPORTED IMMEDIATELY: *FEVER GREATER THAN 100.4 F (38 C) OR HIGHER *CHILLS OR SWEATING *NAUSEA AND VOMITING THAT IS NOT CONTROLLED WITH YOUR NAUSEA MEDICATION *UNUSUAL SHORTNESS OF BREATH *UNUSUAL BRUISING OR BLEEDING *URINARY PROBLEMS (pain or burning when urinating, or frequent urination) *BOWEL PROBLEMS (unusual diarrhea, constipation, pain near the anus) TENDERNESS IN MOUTH AND THROAT WITH OR WITHOUT PRESENCE OF ULCERS (sore throat, sores in mouth, or a toothache) UNUSUAL RASH, SWELLING OR PAIN  UNUSUAL VAGINAL DISCHARGE OR ITCHING   Items with * indicate a potential emergency and should be followed up as soon as possible or go to the Emergency Department if any problems should occur.  Please show the CHEMOTHERAPY ALERT CARD or  IMMUNOTHERAPY ALERT CARD at check-in to the Emergency Department and triage nurse.  Should you have questions after your visit or need to cancel or reschedule your appointment, please contact [facility]  Dept: [phone]  and follow the prompts.  Office hours are 8:00 a.m. to 4:30 p.m. Monday - Friday. Please note that voicemails left after 4:00 p.m. may not be returned until the following business day.  We are closed weekends and major holidays. You have access to a nurse at all times for urgent questions. Please call the main number to the clinic Dept: [phone] and follow the prompts.   For any non-urgent questions, you may also contact your provider using MyChart. We now offer e-Visits for anyone 84 and older to request care online for non-urgent symptoms. For details visit [email or web address].   Also download the MyChart app! Go to the app store, search "MyChart", open the app, select [facility], and log in with your MyChart username and password.  Due to Covid, a mask is required upon entering the hospital/clinic. If you do not have a mask, one will be given to you upon arrival. For doctor visits, patients may have 1 support person aged 22 or older with them. For treatment visits, patients cannot have anyone with them due to current Covid guidelines and our immunocompromised population.    Pembrolizumab injection What is this medication? PEMBROLIZUMAB (pem broe liz ue mab) is a monoclonal antibody. It is used to treat certain types of cancer. This medicine may be used for other purposes; ask your health care provider or pharmacist if you have questions. COMMON BRAND  NAME(S): Keytruda What should I tell my care team before I take this medication? They need to know if you have any of these conditions: autoimmune diseases like Crohn's disease, ulcerative colitis, or lupus have had or planning to have an allogeneic stem cell transplant (uses someone  else's stem cells) history of organ transplant history of chest radiation nervous system problems like myasthenia gravis or Guillain-Barre syndrome an unusual or allergic reaction to pembrolizumab, other medicines, foods, dyes, or preservatives pregnant or trying to get pregnant breast-feeding How should I use this medication? This medicine is for infusion into a vein. It is given by a health care professional in a hospital or clinic setting. A special MedGuide will be given to you before each treatment. Be sure to read this information carefully each time. Talk to your pediatrician regarding the use of this medicine in children. While this drug may be prescribed for children as young as 6 months for selected conditions, precautions do apply. Overdosage: If you think you have taken too much of this medicine contact a poison control center or emergency room at once. NOTE: This medicine is only for you. Do not share this medicine with others. What if I miss a dose? It is important not to miss your dose. Call your doctor or health care professional if you are unable to keep an appointment. What may interact with this medication? Interactions have not been studied. This list may not describe all possible interactions. Give your health care provider a list of all the medicines, herbs, non-prescription drugs, or dietary supplements you use. Also tell them if you smoke, drink alcohol, or use illegal drugs. Some items may interact with your medicine. What should I watch for while using this medication? Your condition will be monitored carefully while you are receiving this medicine. You may need blood work done while you are taking this medicine. Do not become pregnant while taking this medicine or for 4 months after stopping it. Women should inform their doctor if they wish to become pregnant or think they might be pregnant. There is a potential for serious side effects to an unborn child. Talk to your  health care professional or pharmacist for more information. Do not breast-feed an infant while taking this medicine or for 4 months after the last dose. What side effects may I notice from receiving this medication? Side effects that you should report to your doctor or health care professional as soon as possible: allergic reactions like skin rash, itching or hives, swelling of the face, lips, or tongue bloody or black, tarry breathing problems changes in vision chest pain chills confusion constipation cough diarrhea dizziness or feeling faint or lightheaded fast or irregular heartbeat fever flushing joint pain low blood counts - this medicine may decrease the number of white blood cells, red blood cells and platelets. You may be at increased risk for infections and bleeding. muscle pain muscle weakness pain, tingling, numbness in the hands or feet persistent headache redness, blistering, peeling or loosening of the skin, including inside the mouth signs and symptoms of high blood sugar such as dizziness; dry mouth; dry skin; fruity breath; nausea; stomach pain; increased hunger or thirst; increased urination signs and symptoms of kidney injury like trouble passing urine or change in the amount of urine signs and symptoms of liver injury like dark urine, light-colored stools, loss of appetite, nausea, right upper belly pain, yellowing of the eyes or skin sweating swollen lymph nodes weight loss Side effects that usually do  not require medical attention (report to your doctor or health care professional if they continue or are bothersome): decreased appetite hair loss tiredness This list may not describe all possible side effects. Call your doctor for medical advice about side effects. You may report side effects to FDA at 1-800-FDA-1088. Where should I keep my medication? This drug is given in a hospital or clinic and will not be stored at home. NOTE: This sheet is a summary. It  may not cover all possible information. If you have questions about this medicine, talk to your doctor, pharmacist, or health care provider.  2023 Elsevier/Gold Standard (2021-03-30 00:00:00)  Pemetrexed injection What is this medication? PEMETREXED (PEM e TREX ed) is a chemotherapy drug used to treat lung cancers like non-small cell lung cancer and mesothelioma. It may also be used to treat other cancers. This medicine may be used for other purposes; ask your health care provider or pharmacist if you have questions. COMMON BRAND NAME(S): Alimta, PEMFEXY What should I tell my care team before I take this medication? They need to know if you have any of these conditions: infection (especially a virus infection such as chickenpox, cold sores, or herpes) kidney disease low blood counts, like low white cell, platelet, or red cell counts lung or breathing disease, like asthma radiation therapy an unusual or allergic reaction to pemetrexed, other medicines, foods, dyes, or preservative pregnant or trying to get pregnant breast-feeding How should I use this medication? This drug is given as an infusion into a vein. It is administered in a hospital or clinic by a specially trained health care professional. Talk to your pediatrician regarding the use of this medicine in children. Special care may be needed. Overdosage: If you think you have taken too much of this medicine contact a poison control center or emergency room at once. NOTE: This medicine is only for you. Do not share this medicine with others. What if I miss a dose? It is important not to miss your dose. Call your doctor or health care professional if you are unable to keep an appointment. What may interact with this medication? This medicine may interact with the following medications: Ibuprofen This list may not describe all possible interactions. Give your health care provider a list of all the medicines, herbs, non-prescription  drugs, or dietary supplements you use. Also tell them if you smoke, drink alcohol, or use illegal drugs. Some items may interact with your medicine. What should I watch for while using this medication? Visit your doctor for checks on your progress. This drug may make you feel generally unwell. This is not uncommon, as chemotherapy can affect healthy cells as well as cancer cells. Report any side effects. Continue your course of treatment even though you feel ill unless your doctor tells you to stop. In some cases, you may be given additional medicines to help with side effects. Follow all directions for their use. Call your doctor or health care professional for advice if you get a fever, chills or sore throat, or other symptoms of a cold or flu. Do not treat yourself. This drug decreases your body's ability to fight infections. Try to avoid being around people who are sick. This medicine may increase your risk to bruise or bleed. Call your doctor or health care professional if you notice any unusual bleeding. Be careful brushing and flossing your teeth or using a toothpick because you may get an infection or bleed more easily. If you have any dental work  done, tell your dentist you are receiving this medicine. Avoid taking products that contain aspirin, acetaminophen, ibuprofen, naproxen, or ketoprofen unless instructed by your doctor. These medicines may hide a fever. Call your doctor or health care professional if you get diarrhea or mouth sores. Do not treat yourself. To protect your kidneys, drink water or other fluids as directed while you are taking this medicine. Do not become pregnant while taking this medicine or for 6 months after stopping it. Women should inform their doctor if they wish to become pregnant or think they might be pregnant. Men should not father a child while taking this medicine and for 3 months after stopping it. This may interfere with the ability to father a child. You should  talk to your doctor or health care professional if you are concerned about your fertility. There is a potential for serious side effects to an unborn child. Talk to your health care professional or pharmacist for more information. Do not breast-feed an infant while taking this medicine or for 1 week after stopping it. What side effects may I notice from receiving this medication? Side effects that you should report to your doctor or health care professional as soon as possible: allergic reactions like skin rash, itching or hives, swelling of the face, lips, or tongue breathing problems redness, blistering, peeling or loosening of the skin, including inside the mouth signs and symptoms of bleeding such as bloody or black, tarry stools; red or dark-brown urine; spitting up blood or brown material that looks like coffee grounds; red spots on the skin; unusual bruising or bleeding from the eye, gums, or nose signs and symptoms of infection like fever or chills; cough; sore throat; pain or trouble passing urine signs and symptoms of kidney injury like trouble passing urine or change in the amount of urine signs and symptoms of liver injury like dark yellow or brown urine; general ill feeling or flu-like symptoms; light-colored stools; loss of appetite; nausea; right upper belly pain; unusually weak or tired; yellowing of the eyes or skin Side effects that usually do not require medical attention (report to your doctor or health care professional if they continue or are bothersome): constipation mouth sores nausea, vomiting unusually weak or tired This list may not describe all possible side effects. Call your doctor for medical advice about side effects. You may report side effects to FDA at 1-800-FDA-1088. Where should I keep my medication? This drug is given in a hospital or clinic and will not be stored at home. NOTE: This sheet is a summary. It may not cover all possible information. If you have  questions about this medicine, talk to your doctor, pharmacist, or health care provider.  2023 Elsevier/Gold Standard (2017-06-24 00:00:00)  Carboplatin injection What is this medication? CARBOPLATIN (KAR boe pla tin) is a chemotherapy drug. It targets fast dividing cells, like cancer cells, and causes these cells to die. This medicine is used to treat ovarian cancer and many other cancers. This medicine may be used for other purposes; ask your health care provider or pharmacist if you have questions. COMMON BRAND NAME(S): Paraplatin What should I tell my care team before I take this medication? They need to know if you have any of these conditions: blood disorders hearing problems kidney disease recent or ongoing radiation therapy an unusual or allergic reaction to carboplatin, cisplatin, other chemotherapy, other medicines, foods, dyes, or preservatives pregnant or trying to get pregnant breast-feeding How should I use this medication? This drug is  usually given as an infusion into a vein. It is administered in a hospital or clinic by a specially trained health care professional. Talk to your pediatrician regarding the use of this medicine in children. Special care may be needed. Overdosage: If you think you have taken too much of this medicine contact a poison control center or emergency room at once. NOTE: This medicine is only for you. Do not share this medicine with others. What if I miss a dose? It is important not to miss a dose. Call your doctor or health care professional if you are unable to keep an appointment. What may interact with this medication? medicines for seizures medicines to increase blood counts like filgrastim, pegfilgrastim, sargramostim some antibiotics like amikacin, gentamicin, neomycin, streptomycin, tobramycin vaccines Talk to your doctor or health care professional before taking any of these  medicines: acetaminophen aspirin ibuprofen ketoprofen naproxen This list may not describe all possible interactions. Give your health care provider a list of all the medicines, herbs, non-prescription drugs, or dietary supplements you use. Also tell them if you smoke, drink alcohol, or use illegal drugs. Some items may interact with your medicine. What should I watch for while using this medication? Your condition will be monitored carefully while you are receiving this medicine. You will need important blood work done while you are taking this medicine. This drug may make you feel generally unwell. This is not uncommon, as chemotherapy can affect healthy cells as well as cancer cells. Report any side effects. Continue your course of treatment even though you feel ill unless your doctor tells you to stop. In some cases, you may be given additional medicines to help with side effects. Follow all directions for their use. Call your doctor or health care professional for advice if you get a fever, chills or sore throat, or other symptoms of a cold or flu. Do not treat yourself. This drug decreases your body's ability to fight infections. Try to avoid being around people who are sick. This medicine may increase your risk to bruise or bleed. Call your doctor or health care professional if you notice any unusual bleeding. Be careful brushing and flossing your teeth or using a toothpick because you may get an infection or bleed more easily. If you have any dental work done, tell your dentist you are receiving this medicine. Avoid taking products that contain aspirin, acetaminophen, ibuprofen, naproxen, or ketoprofen unless instructed by your doctor. These medicines may hide a fever. Do not become pregnant while taking this medicine. Women should inform their doctor if they wish to become pregnant or think they might be pregnant. There is a potential for serious side effects to an unborn child. Talk to your  health care professional or pharmacist for more information. Do not breast-feed an infant while taking this medicine. What side effects may I notice from receiving this medication? Side effects that you should report to your doctor or health care professional as soon as possible: allergic reactions like skin rash, itching or hives, swelling of the face, lips, or tongue signs of infection - fever or chills, cough, sore throat, pain or difficulty passing urine signs of decreased platelets or bleeding - bruising, pinpoint red spots on the skin, black, tarry stools, nosebleeds signs of decreased red blood cells - unusually weak or tired, fainting spells, lightheadedness breathing problems changes in hearing changes in vision chest pain high blood pressure low blood counts - This drug may decrease the number of white blood cells, red  blood cells and platelets. You may be at increased risk for infections and bleeding. nausea and vomiting pain, swelling, redness or irritation at the injection site pain, tingling, numbness in the hands or feet problems with balance, talking, walking trouble passing urine or change in the amount of urine Side effects that usually do not require medical attention (report to your doctor or health care professional if they continue or are bothersome): hair loss loss of appetite metallic taste in the mouth or changes in taste This list may not describe all possible side effects. Call your doctor for medical advice about side effects. You may report side effects to FDA at 1-800-FDA-1088. Where should I keep my medication? This drug is given in a hospital or clinic and will not be stored at home. NOTE: This sheet is a summary. It may not cover all possible information. If you have questions about this medicine, talk to your doctor, pharmacist, or health care provider.  2023 Elsevier/Gold Standard (2007-10-07 00:00:00)

## 2021-10-04 ENCOUNTER — Ambulatory Visit (INDEPENDENT_AMBULATORY_CARE_PROVIDER_SITE_OTHER): Payer: BC Managed Care – PPO | Admitting: Emergency Medicine

## 2021-10-04 ENCOUNTER — Encounter: Payer: Self-pay | Admitting: Emergency Medicine

## 2021-10-04 DIAGNOSIS — C3491 Malignant neoplasm of unspecified part of right bronchus or lung: Secondary | ICD-10-CM | POA: Diagnosis not present

## 2021-10-04 DIAGNOSIS — J449 Chronic obstructive pulmonary disease, unspecified: Secondary | ICD-10-CM

## 2021-10-04 MED ORDER — BREZTRI AEROSPHERE 160-9-4.8 MCG/ACT IN AERO: 2.0000 | INHALATION_SPRAY | Freq: Two times a day (BID) | RESPIRATORY_TRACT | 0 refills | Status: DC

## 2021-10-04 NOTE — Assessment & Plan Note (Signed)
Continue to follow with oncology and radiation oncology as planned.  They will help organize the timing of your repeat CT scan of the chest.

## 2021-10-04 NOTE — Assessment & Plan Note (Signed)
Temporarily stop your Advair. We will try starting Breztri 2 puffs twice a day.  Rinse and gargle after using.  Please call our office and let us know if you benefit from this medication.  If so we will send a prescription to your pharmacy. Keep albuterol available to use 2 puffs if needed for shortness of breath, chest tightness, wheezing. We will plan to perform pulmonary function testing at some point in the future.

## 2021-10-04 NOTE — Addendum Note (Signed)
Addended by: Gavin Potters R on: 10/04/2021 12:33 PM   Modules accepted: Orders

## 2021-10-04 NOTE — Progress Notes (Signed)
Subjective:    Patient ID: April Walsh, female    DOB: 1965/08/16, 56 y.o.   MRN: [id number]  HPI 56 year old smoker, 32 pack years, with a history of COPD/asthma, cervical cancer, basal cell skin cancer, migraines, depression. She carries a history of COPD/asthma and was having flaring symptoms, cough, bronchitic complaints, prompted chest imaging.  Her Symbicort was changed to Advair HFA.  She is here to discuss abnormal chest x-ray and CT chest.  CT chest 07/20/2021 done at [facility] and reviewed by me shows a 3.1 x 2.2 cm right hilar mass with associated near complete opacification of the right upper lobe bronchus, 2 cm subcarinal node, 9 x 8 mm posterior right upper lobe nodular density.  Also noted were large bilateral adrenal masses concerning for metastatic disease.  There is a 1.5 cm rounded right breast density as well.   ROV 10/04/21 --follow-up visit 56 year old woman with a history of tobacco use, cervical cancer, basal cell skin cancer, COPD/asthma with chronic bronchitic symptoms.  I saw her in late March for an abnormal CT scan of the chest and she underwent bronchoscopy on 08/13/2021 for a right hilar mass and mediastinal adenopathy.  Pathology consistent with adenocarcinoma.  MRI brain negative.  She has undergone XRT and chemo 2 cycles. 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 Her L arm remains a bit weak. Now back to her chemo.  She is currently on Advair HFA. She has stopped smoking. Her breathing is limited, exertion is limited.    Review of Systems As per HPI  PMH: COPD/asthma Cervical cancer Basal cell skin cancer Migraines Depression  Surgical history: Appendectomy Cesarean x2 Partial hysterectomy tubal ligation Breast biopsy  Family History  Problem Relation Age of Onset   Brain cancer Maternal Aunt    Throat cancer Maternal Aunt    Lung cancer Paternal Uncle        he smoked   Lung cancer Paternal Uncle        he smoked   Ovarian cancer  Maternal Grandmother    Lung cancer Cousin      Social History   Socioeconomic History   Marital status: Married    Spouse name: Not on file   Number of children: Not on file   Years of education: Not on file   Highest education level: Not on file  Occupational History   Not on file  Tobacco Use   Smoking status: Former    Packs/day: 0.50    Years: 32.00    Pack years: 16.00    Types: Cigarettes    Passive exposure: Past   Smokeless tobacco: Never   Tobacco comments:    1/2 pack smoked daily ARJ, RN 08/02/21  Vaping Use   Vaping Use: Never used  Substance and Sexual Activity   Alcohol use: Never   Drug use: Never   Sexual activity: Not on file  Other Topics Concern   Not on file  Social History Narrative   Not on file   Social Determinants of Health   Financial Resource Strain: Not on file  Food Insecurity: Not on file  Transportation Needs: Not on file  Physical Activity: Not on file  Stress: Not on file  Social Connections: Not on file  Intimate Partner Violence: Not on file    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   No Known Allergies   Outpatient Medications Prior to Visit  Medication Sig Dispense Refill  ADVAIR HFA 115-21 MCG/ACT inhaler Inhale 2 puffs into the lungs 2 (two) times daily.     apixaban (ELIQUIS) 2.5 MG TABS tablet Take 1 tablet (2.5 mg total) by mouth 2 (two) times daily. 60 tablet 0   folic acid (FOLVITE) 1 MG tablet Take 1 tablet (1 mg total) by mouth daily. 30 tablet 2   atorvastatin (LIPITOR) 40 MG tablet Take 1 tablet (40 mg total) by mouth daily. (Patient not taking: Reported on 10/04/2021) 30 tablet 0   prochlorperazine (COMPAZINE) 10 MG tablet Take 1 tablet (10 mg total) by mouth every 6 (six) hours as needed. (Patient not taking: Reported on 10/04/2021) 30 tablet 2   No facility-administered medications prior to visit.        Objective:   Physical Exam Vitals:   10/04/21 1155  BP:  118/68  Pulse: 77  Temp: 98.7 F (37.1 C)  TempSrc: Oral  SpO2: 96%  Weight: 103 lb 9.6 oz (47 kg)  Height: 5' (1.524 m)   Gen: Pleasant, thin, in no distress,  normal affect  ENT: No lesions,  mouth clear,  oropharynx clear, no postnasal drip  Neck: No JVD, no stridor  Lungs: No use of accessory muscles, inspiratory squeak on the R, L is clear  Cardiovascular: RRR,  syst M w intact S2, no peripheral edema  Musculoskeletal: No deformities, no cyanosis or clubbing  Neuro: alert, awake, non focal  Skin: Warm, no lesions or rash      Assessment & Plan:  Adenocarcinoma of right lung, stage 4 (HCC) Continue to follow with oncology and radiation oncology as planned.  They will help organize the timing of your repeat CT scan of the chest.  COPD (chronic obstructive pulmonary disease) ([place]) Temporarily stop your Advair. We will try starting Breztri 2 puffs twice a day.  Rinse and gargle after using.  Please call our office and let us know if you benefit from this medication.  If so we will send a prescription to your pharmacy. Keep albuterol available to use 2 puffs if needed for shortness of breath, chest tightness, wheezing. We will plan to perform pulmonary function testing at some point in the future.    Baltazar Apo, MD, PhD 10/04/2021, 12:27 PM [address] [facility] [phone] or if no answer before 7:00PM call [phone] For any issues after 7:00PM please call eLink [phone]

## 2021-10-04 NOTE — Patient Instructions (Signed)
Temporarily stop your Advair. We will try starting Breztri 2 puffs twice a day.  Rinse and gargle after using.  Please call our office and let us know if you benefit from this medication.  If so we will send a prescription to your pharmacy. Keep albuterol available to use 2 puffs if needed for shortness of breath, chest tightness, wheezing. We will plan to perform pulmonary function testing at some point in the future. Continue to follow with oncology and radiation oncology as planned.  They will help organize the timing of your repeat CT scan of the chest. Follow with Dr Lamonte Sakai in 6 months or sooner if you have any problems

## 2021-10-09 ENCOUNTER — Telehealth: Payer: Self-pay | Admitting: Emergency Medicine

## 2021-10-09 ENCOUNTER — Inpatient Hospital Stay: Payer: BC Managed Care – PPO

## 2021-10-09 ENCOUNTER — Other Ambulatory Visit: Payer: Self-pay

## 2021-10-09 DIAGNOSIS — Z7901 Long term (current) use of anticoagulants: Secondary | ICD-10-CM | POA: Diagnosis not present

## 2021-10-09 DIAGNOSIS — I959 Hypotension, unspecified: Secondary | ICD-10-CM | POA: Diagnosis not present

## 2021-10-09 DIAGNOSIS — Z5112 Encounter for antineoplastic immunotherapy: Secondary | ICD-10-CM | POA: Diagnosis not present

## 2021-10-09 DIAGNOSIS — C3401 Malignant neoplasm of right main bronchus: Secondary | ICD-10-CM

## 2021-10-09 DIAGNOSIS — Z5111 Encounter for antineoplastic chemotherapy: Secondary | ICD-10-CM | POA: Diagnosis not present

## 2021-10-09 DIAGNOSIS — I63511 Cerebral infarction due to unspecified occlusion or stenosis of right middle cerebral artery: Secondary | ICD-10-CM | POA: Diagnosis not present

## 2021-10-09 DIAGNOSIS — C3491 Malignant neoplasm of unspecified part of right bronchus or lung: Secondary | ICD-10-CM | POA: Diagnosis not present

## 2021-10-09 LAB — CBC WITH DIFFERENTIAL (CANCER CENTER ONLY)
Abs Immature Granulocytes: 0.01 10*3/uL (ref 0.00–0.07)
Basophils Absolute: 0 10*3/uL (ref 0.0–0.1)
Basophils Relative: 1 %
Eosinophils Absolute: 0.2 10*3/uL (ref 0.0–0.5)
Eosinophils Relative: 4 %
HCT: 33.7 % — ABNORMAL LOW (ref 36.0–46.0)
Hemoglobin: 11.5 g/dL — ABNORMAL LOW (ref 12.0–15.0)
Immature Granulocytes: 0 %
Lymphocytes Relative: 36 %
Lymphs Abs: 1.3 10*3/uL (ref 0.7–4.0)
MCH: 32.1 pg (ref 26.0–34.0)
MCHC: 34.1 g/dL (ref 30.0–36.0)
MCV: 94.1 fL (ref 80.0–100.0)
Monocytes Absolute: 0.4 10*3/uL (ref 0.1–1.0)
Monocytes Relative: 11 %
Neutro Abs: 1.8 10*3/uL (ref 1.7–7.7)
Neutrophils Relative %: 48 %
Platelet Count: 157 10*3/uL (ref 150–400)
RBC: 3.58 MIL/uL — ABNORMAL LOW (ref 3.87–5.11)
RDW: 15 % (ref 11.5–15.5)
Smear Review: NORMAL
WBC Count: 3.7 10*3/uL — ABNORMAL LOW (ref 4.0–10.5)
nRBC: 0 % (ref 0.0–0.2)

## 2021-10-09 LAB — CMP (CANCER CENTER ONLY)
ALT: 9 U/L (ref 0–44)
AST: 14 U/L — ABNORMAL LOW (ref 15–41)
Albumin: 3.8 g/dL (ref 3.5–5.0)
Alkaline Phosphatase: 71 U/L (ref 38–126)
Anion gap: 4 — ABNORMAL LOW (ref 5–15)
BUN: 20 mg/dL (ref 6–20)
CO2: 28 mmol/L (ref 22–32)
Calcium: 9.6 mg/dL (ref 8.9–10.3)
Chloride: 102 mmol/L (ref 98–111)
Creatinine: 0.52 mg/dL (ref 0.44–1.00)
GFR, Estimated: >60 mL/min (ref >60)
Glucose, Bld: 91 mg/dL (ref 70–99)
Potassium: 3.9 mmol/L (ref 3.5–5.1)
Sodium: 134 mmol/L — ABNORMAL LOW (ref 135–145)
Total Bilirubin: 0.2 mg/dL — ABNORMAL LOW (ref 0.3–1.2)
Total Protein: 6.9 g/dL (ref 6.5–8.1)

## 2021-10-09 MED ORDER — BREZTRI AEROSPHERE 160-9-4.8 MCG/ACT IN AERO: 2.0000 | INHALATION_SPRAY | Freq: Two times a day (BID) | RESPIRATORY_TRACT | 11 refills | Status: DC

## 2021-10-09 NOTE — Telephone Encounter (Signed)
Spoke with the pt  She states April Walsh is working very well  She prefers this over advair  Based on last ov ok to go ahead and send in rx if med was helping  Rx sent and nothing further needed

## 2021-10-10 ENCOUNTER — Ambulatory Visit: Payer: BC Managed Care – PPO

## 2021-10-10 ENCOUNTER — Other Ambulatory Visit: Payer: BC Managed Care – PPO

## 2021-10-10 ENCOUNTER — Ambulatory Visit: Payer: BC Managed Care – PPO | Admitting: Internal Medicine

## 2021-10-10 DIAGNOSIS — R29898 Other symptoms and signs involving the musculoskeletal system: Secondary | ICD-10-CM | POA: Diagnosis not present

## 2021-10-10 DIAGNOSIS — I6930 Unspecified sequelae of cerebral infarction: Secondary | ICD-10-CM | POA: Diagnosis not present

## 2021-10-16 ENCOUNTER — Other Ambulatory Visit: Payer: Self-pay

## 2021-10-16 ENCOUNTER — Inpatient Hospital Stay: Payer: BC Managed Care – PPO | Attending: Radiation Oncology

## 2021-10-16 ENCOUNTER — Other Ambulatory Visit: Payer: BC Managed Care – PPO

## 2021-10-16 DIAGNOSIS — Z7901 Long term (current) use of anticoagulants: Secondary | ICD-10-CM | POA: Diagnosis not present

## 2021-10-16 DIAGNOSIS — I63511 Cerebral infarction due to unspecified occlusion or stenosis of right middle cerebral artery: Secondary | ICD-10-CM | POA: Diagnosis not present

## 2021-10-16 DIAGNOSIS — C3401 Malignant neoplasm of right main bronchus: Secondary | ICD-10-CM

## 2021-10-16 DIAGNOSIS — R21 Rash and other nonspecific skin eruption: Secondary | ICD-10-CM | POA: Insufficient documentation

## 2021-10-16 DIAGNOSIS — I959 Hypotension, unspecified: Secondary | ICD-10-CM | POA: Insufficient documentation

## 2021-10-16 DIAGNOSIS — Z87891 Personal history of nicotine dependence: Secondary | ICD-10-CM | POA: Insufficient documentation

## 2021-10-16 DIAGNOSIS — R634 Abnormal weight loss: Secondary | ICD-10-CM | POA: Diagnosis not present

## 2021-10-16 DIAGNOSIS — C3491 Malignant neoplasm of unspecified part of right bronchus or lung: Secondary | ICD-10-CM | POA: Insufficient documentation

## 2021-10-16 DIAGNOSIS — E538 Deficiency of other specified B group vitamins: Secondary | ICD-10-CM | POA: Diagnosis not present

## 2021-10-16 DIAGNOSIS — Z5111 Encounter for antineoplastic chemotherapy: Secondary | ICD-10-CM | POA: Diagnosis not present

## 2021-10-16 LAB — CMP (CANCER CENTER ONLY)
ALT: 9 U/L (ref 0–44)
AST: 13 U/L — ABNORMAL LOW (ref 15–41)
Albumin: 3.8 g/dL (ref 3.5–5.0)
Alkaline Phosphatase: 73 U/L (ref 38–126)
Anion gap: 6 (ref 5–15)
BUN: 16 mg/dL (ref 6–20)
CO2: 23 mmol/L (ref 22–32)
Calcium: 9.2 mg/dL (ref 8.9–10.3)
Chloride: 105 mmol/L (ref 98–111)
Creatinine: 0.47 mg/dL (ref 0.44–1.00)
GFR, Estimated: >60 mL/min (ref >60)
Glucose, Bld: 130 mg/dL — ABNORMAL HIGH (ref 70–99)
Potassium: 4.1 mmol/L (ref 3.5–5.1)
Sodium: 134 mmol/L — ABNORMAL LOW (ref 135–145)
Total Bilirubin: 0.2 mg/dL — ABNORMAL LOW (ref 0.3–1.2)
Total Protein: 6.7 g/dL (ref 6.5–8.1)

## 2021-10-16 LAB — CBC WITH DIFFERENTIAL (CANCER CENTER ONLY)
Abs Immature Granulocytes: 0.01 10*3/uL (ref 0.00–0.07)
Basophils Absolute: 0 10*3/uL (ref 0.0–0.1)
Basophils Relative: 1 %
Eosinophils Absolute: 0.1 10*3/uL (ref 0.0–0.5)
Eosinophils Relative: 3 %
HCT: 31.6 % — ABNORMAL LOW (ref 36.0–46.0)
Hemoglobin: 10.7 g/dL — ABNORMAL LOW (ref 12.0–15.0)
Immature Granulocytes: 0 %
Lymphocytes Relative: 33 %
Lymphs Abs: 1.2 10*3/uL (ref 0.7–4.0)
MCH: 31.8 pg (ref 26.0–34.0)
MCHC: 33.9 g/dL (ref 30.0–36.0)
MCV: 93.8 fL (ref 80.0–100.0)
Monocytes Absolute: 0.8 10*3/uL (ref 0.1–1.0)
Monocytes Relative: 21 %
Neutro Abs: 1.4 10*3/uL — ABNORMAL LOW (ref 1.7–7.7)
Neutrophils Relative %: 42 %
Platelet Count: 61 10*3/uL — ABNORMAL LOW (ref 150–400)
RBC: 3.37 MIL/uL — ABNORMAL LOW (ref 3.87–5.11)
RDW: 15.2 % (ref 11.5–15.5)
Smear Review: NORMAL
WBC Count: 3.5 10*3/uL — ABNORMAL LOW (ref 4.0–10.5)
nRBC: 0 % (ref 0.0–0.2)

## 2021-10-16 NOTE — Progress Notes (Signed)
[name]  April Gaskins, FNP [address] [place] [address]  DIAGNOSIS: Stage IV (T2b, N2, M1 C) non-small cell lung cancer, adenocarcinoma presented with right hilar mass in addition to mediastinal and upper abdominal lymphadenopathy in addition to bilateral adrenal metastases and metastatic disease in the musculature of the lower back.  This was diagnosed in April 2023.   DETECTED ALTERATION(S) / BIOMARKER(S)     % CFDNA OR AMPLIFICATION        ASSOCIATED FDA-APPROVED THERAPIES         CLINICAL TRIAL AVAILABILITY [id number] 0.5% None     Yes [id number] (Splice Site Indel) 9.9% None     Yes   PD-L1 expression 0%  PRIOR THERAPY:  Palliative radiotherapy to the right hilar region under the care of Dr. Lisbeth Renshaw.  CURRENT THERAPY:  Systemic chemotherapy with carboplatin for AUC of 5, Alimta 500 Mg/M2 and Keytruda 200 Mg IV every 3 weeks.  First dose August 29, 2021. Status post 2 cycles.   INTERVAL HISTORY: April Walsh 56 y.o. female returns to the clinic today for a follow-up visit.  The patient was unfortunately recently diagnosed with stage IV non-small cell lung cancer, adenocarcinoma.  The patient underwent 2 cycles of chemotherapy. She has tolerated this well without any major concerning adverse side effects except for nausea and mild rash. She sometimes has nausea starting on day 4 of treatment which is controlled with her anti-emetic. Denies associated vomiting. She needs a refill of her anti-emetic. Overall, the patient is feeling fair today. She has a mild rash on her chest and between her shoulder blades. She received hydrocortisone cream from the hospital which has helped. She denies any fever, chills, or night sweats.  She saw Dr. Lockie Pares recently who adjusted her inhalers which made a "big difference".  She reports improvement in her shortness of breath.  She also reports that her baseline cough is also better and  "nowhere near like it was".she reports she has not needed to take any cough medicine since last being seen.  Denies any hemoptysis or chest pain.  Denies any vomiting, diarrhea, constipation, or visual changes.  She reports sometimes at night she may feel a headache coming on but it last a few minutes before it resolves.  She has not needed to take any Tylenol for this as it resolved spontaneously.  She has lost approximately 5 pounds since last being seen.  The patient is scheduled to see a member the nutritionist team today.  The patient states that she eats 5 meals a day and tries to get protein.  She thinks that some of her weight loss may be attributed with the grief of losing her daughter last year.  She reports that she has quit smoking since being diagnosed with cancer.  She is here for repeat blood work before considering starting cycle #3.     MEDICAL HISTORY: Past Medical History:  Diagnosis Date   Anemia    Asthma    Bronchitis    Cancer ([place])    basal cell melanoma, cervical cancer   COPD (chronic obstructive pulmonary disease) ([place])    "early stages of COPD"   Headache    Lung cancer ([place]) 08/13/2021    ALLERGIES:  has No Known Allergies.  MEDICATIONS:  Current Outpatient Medications  Medication Sig Dispense Refill   apixaban (ELIQUIS) 2.5 MG TABS tablet Take 1 tablet (2.5 mg total) by mouth 2 (two) times daily. 60 tablet  0   Budeson-Glycopyrrol-Formoterol (BREZTRI AEROSPHERE) 160-9-4.8 MCG/ACT AERO Inhale 2 puffs into the lungs in the morning and at bedtime. 65.9 g 11   folic acid (FOLVITE) 1 MG tablet Take 1 tablet (1 mg total) by mouth daily. 30 tablet 2   triamcinolone cream (KENALOG) 0.1 % Apply 1 application  topically 2 (two) times daily as needed. 80 g 0   atorvastatin (LIPITOR) 40 MG tablet Take 1 tablet (40 mg total) by mouth daily. (Patient not taking: Reported on 10/04/2021) 30 tablet 0   prochlorperazine (COMPAZINE) 10 MG tablet Take 1 tablet (10 mg total) by  mouth every 6 (six) hours as needed. 30 tablet 2   No current facility-administered medications for this visit.    SURGICAL HISTORY:  Past Surgical History:  Procedure Laterality Date   APPENDECTOMY     1996   BREAST BIOPSY Right    2010-2015   BRONCHIAL BIOPSY  08/13/2021   Procedure: BRONCHIAL BIOPSIES;  Surgeon: Collene Gobble, MD;  Location: [facility] ENDOSCOPY;  Service: Pulmonary;;   BRONCHIAL BRUSHINGS  08/13/2021   Procedure: BRONCHIAL BRUSHINGS;  Surgeon: Collene Gobble, MD;  Location: [facility] ENDOSCOPY;  Service: Pulmonary;;   BRONCHIAL NEEDLE ASPIRATION BIOPSY  08/13/2021   Procedure: BRONCHIAL NEEDLE ASPIRATION BIOPSIES;  Surgeon: Collene Gobble, MD;  Location: [facility] ENDOSCOPY;  Service: Pulmonary;;   CESAREAN [place] Right    2 pins and screw 1999   FOOT SURGERY Left 06/21/2021   screw and 2 pins   HEMOSTASIS CONTROL  08/13/2021   Procedure: HEMOSTASIS CONTROL;  Surgeon: Collene Gobble, MD;  Location: [facility] ENDOSCOPY;  Service: Pulmonary;;   MELANOMA EXCISION Left    basal cell melanoma 2011   [place]   VIDEO BRONCHOSCOPY  08/13/2021   Procedure: VIDEO BRONCHOSCOPY WITHOUT FLUORO;  Surgeon: Collene Gobble, MD;  Location[facility] ENDOSCOPY;  Service: Pulmonary;;   VIDEO BRONCHOSCOPY WITH ENDOBRONCHIAL ULTRASOUND N/A 08/13/2021   Procedure: VIDEO BRONCHOSCOPY WITH ENDOBRONCHIAL ULTRASOUND;  Surgeon: Collene Gobble, MD;  Location: [facility] ENDOSCOPY;  Service: Pulmonary;  Laterality: N/A;    REVIEW OF SYSTEMS:   Review of Systems  Constitutional: Positive for weight loss.  Negative for appetite change, chills, fatigue, and fever HENT:   Negative for mouth sores, nosebleeds, sore throat and trouble swallowing.   Eyes: Negative for eye problems and icterus.  Respiratory: Positive for improving dyspnea with activities.  Positive for improving cough.  Negative for hemoptysis and wheezing.   Cardiovascular: Negative for chest  pain and leg swelling.  Gastrointestinal: Positive for some nausea following treatment.  Negative for abdominal pain, constipation, diarrhea, and vomiting.  Genitourinary: Negative for bladder incontinence, difficulty urinating, dysuria, frequency and hematuria.   Musculoskeletal: Negative for back pain, gait problem, neck pain and neck stiffness.  Skin: Positive for rash on chest. Neurological: Negative for dizziness, extremity weakness, gait problem, headaches, light-headedness and seizures.  Hematological: Negative for adenopathy. Does not bruise/bleed easily.  Psychiatric/Behavioral: Negative for confusion, depression and sleep disturbance. The patient is not nervous/anxious.     PHYSICAL EXAMINATION:  Blood pressure 100/75, pulse 87, temperature (!) 97.5 F (36.4 C), temperature source Tympanic, resp. rate 18, height 5' (1.524 m), weight 98 lb 1.6 oz (44.5 kg), SpO2 99 %.  ECOG PERFORMANCE STATUS: 1  Physical Exam  Constitutional: Oriented to person, place, and time and positive for thin appearing  female and in no acute distress.  HENT:  Head: Normocephalic and atraumatic.  Mouth/Throat: Oropharynx is clear and moist. No oropharyngeal exudate.  Eyes: Conjunctivae are normal. Right eye exhibits no discharge. Left eye exhibits no discharge. No scleral icterus.  Neck: Normal range of motion. Neck supple.  Cardiovascular: Normal rate, regular rhythm, normal heart sounds and intact distal pulses.   Pulmonary/Chest: Effort normal and breath sounds normal. No respiratory distress. No wheezes. No rales.  Abdominal: Soft. Bowel sounds are normal. Exhibits no distension and no mass. There is no tenderness.  Musculoskeletal: Normal range of motion. Exhibits no edema.  Lymphadenopathy:    No cervical adenopathy.  Neurological: Alert and oriented to person, place, and time. Exhibits normal muscle tone. Gait normal. Coordination normal.  Skin: Positive for mild scattered rash on chest and between  her shoulder blades skin is warm and dry. Not diaphoretic. No erythema. No pallor.  Psychiatric: Mood, memory and judgment normal.  Vitals reviewed.  LABORATORY DATA: Lab Results  Component Value Date   WBC 3.5 (L) 10/23/2021   HGB 11.3 (L) 10/23/2021   HCT 33.4 (L) 10/23/2021   MCV 95.4 10/23/2021   PLT 170 10/23/2021      Chemistry      Component Value Date/Time   NA 137 10/23/2021 0854   K 3.8 10/23/2021 0854   CL 106 10/23/2021 0854   CO2 24 10/23/2021 0854   BUN 14 10/23/2021 0854   CREATININE 0.52 10/23/2021 0854      Component Value Date/Time   CALCIUM 9.7 10/23/2021 0854   ALKPHOS 72 10/23/2021 0854   AST 15 10/23/2021 0854   ALT 9 10/23/2021 0854   BILITOT 0.2 (L) 10/23/2021 0854       RADIOGRAPHIC STUDIES:  No results found.   ASSESSMENT/PLAN:  This is a very pleasant 56 years old white female with Stage IV (T2b, N2, M1 C) non-small cell lung cancer, adenocarcinoma presented with right hilar mass in addition to mediastinal and upper abdominal lymphadenopathy in addition to bilateral adrenal metastases and metastatic disease in the musculature of the lower back.  This was diagnosed in April 2023.   The molecular studies by [employer] showed no actionable mutations and the patient has negative PD-L1 expression. She is currently undergoing palliative radiotherapy to the right hilar region under the care of Dr. Lisbeth Renshaw.  She completed palliative radiation to the right hilar area under the care of Dr. Lisbeth Renshaw.  She is currently undergoing palliative systemic chemotherapy with carboplatin for an AUC of 5, Alimta 500 mg per metered squared, Keytruda 200 mg IV every 3 weeks.  She is status post 2 cycles.  Her first dose was on 08/29/2021.    Labs were reviewed.  Her total white blood cell count is 3.5.  Her ANC is  1.8  Recommend that she proceed with cycle #3 today scheduled.  We will monitor her labs closely on a weekly basis.  We will arrange for G-CSF support if  needed for neutropenia.  I reviewed this with the patient at that is a possibility that we may call her on her weekly lab visits to bring her in for injections.    For the rash on her chest, this may be secondary to her immunotherapy.  I sent her a prescription for Kenalog cream if needed.  If she has systemic generalized itching discussed she may take an antihistamine.  The rash is not very troublesome to her at this time.  Because she had a PE study  on 09/11/2021, we will arrange for repeat imaging at her next appointment.  I have sent a refill of her Compazine to the pharmacy.  For her weight loss, she will continue to eat 5 meals a day and increase her protein intake.  She is scheduled to meet with the member the nutritionist team while in the infusion room today.  The patient was advised to call immediately if she has any concerning symptoms in the interval. The patient voices understanding of current disease status and treatment options and is in agreement with the current care plan. All questions were answered. The patient knows to call the clinic with any problems, questions or concerns. We can certainly see the patient much sooner if necessary       Orders Placed This Encounter  Procedures   TSH    Standing Status:   Standing    Number of Occurrences:   3    Standing Expiration Date:   10/24/2022      The total time spent in the appointment was 20-29 minutes.   Veniamin Kincaid L Sewell Pitner, PA-C 10/23/21

## 2021-10-23 ENCOUNTER — Other Ambulatory Visit: Payer: Self-pay

## 2021-10-23 ENCOUNTER — Inpatient Hospital Stay: Payer: BC Managed Care – PPO

## 2021-10-23 ENCOUNTER — Other Ambulatory Visit: Payer: BC Managed Care – PPO

## 2021-10-23 ENCOUNTER — Other Ambulatory Visit: Payer: Self-pay | Admitting: Lab

## 2021-10-23 ENCOUNTER — Inpatient Hospital Stay: Payer: BC Managed Care – PPO | Admitting: Dietician

## 2021-10-23 ENCOUNTER — Inpatient Hospital Stay (HOSPITAL_BASED_OUTPATIENT_CLINIC_OR_DEPARTMENT_OTHER): Payer: BC Managed Care – PPO | Admitting: Physician Assistant

## 2021-10-23 VITALS — BP 100/75 | HR 87 | Temp 97.5°F | Resp 18 | Ht 60.0 in | Wt 98.1 lb

## 2021-10-23 DIAGNOSIS — Z5112 Encounter for antineoplastic immunotherapy: Secondary | ICD-10-CM

## 2021-10-23 DIAGNOSIS — R21 Rash and other nonspecific skin eruption: Secondary | ICD-10-CM | POA: Diagnosis not present

## 2021-10-23 DIAGNOSIS — Z7901 Long term (current) use of anticoagulants: Secondary | ICD-10-CM | POA: Diagnosis not present

## 2021-10-23 DIAGNOSIS — C3401 Malignant neoplasm of right main bronchus: Secondary | ICD-10-CM | POA: Diagnosis not present

## 2021-10-23 DIAGNOSIS — Z5111 Encounter for antineoplastic chemotherapy: Secondary | ICD-10-CM | POA: Diagnosis not present

## 2021-10-23 DIAGNOSIS — I959 Hypotension, unspecified: Secondary | ICD-10-CM | POA: Diagnosis not present

## 2021-10-23 DIAGNOSIS — C3491 Malignant neoplasm of unspecified part of right bronchus or lung: Secondary | ICD-10-CM

## 2021-10-23 DIAGNOSIS — R634 Abnormal weight loss: Secondary | ICD-10-CM | POA: Diagnosis not present

## 2021-10-23 DIAGNOSIS — Z87891 Personal history of nicotine dependence: Secondary | ICD-10-CM | POA: Diagnosis not present

## 2021-10-23 DIAGNOSIS — I63511 Cerebral infarction due to unspecified occlusion or stenosis of right middle cerebral artery: Secondary | ICD-10-CM | POA: Diagnosis not present

## 2021-10-23 DIAGNOSIS — E538 Deficiency of other specified B group vitamins: Secondary | ICD-10-CM | POA: Diagnosis not present

## 2021-10-23 LAB — CBC WITH DIFFERENTIAL (CANCER CENTER ONLY)
Abs Immature Granulocytes: 0.01 10*3/uL (ref 0.00–0.07)
Basophils Absolute: 0 10*3/uL (ref 0.0–0.1)
Basophils Relative: 1 %
Eosinophils Absolute: 0.1 10*3/uL (ref 0.0–0.5)
Eosinophils Relative: 2 %
HCT: 33.4 % — ABNORMAL LOW (ref 36.0–46.0)
Hemoglobin: 11.3 g/dL — ABNORMAL LOW (ref 12.0–15.0)
Immature Granulocytes: 0 %
Lymphocytes Relative: 31 %
Lymphs Abs: 1.1 10*3/uL (ref 0.7–4.0)
MCH: 32.3 pg (ref 26.0–34.0)
MCHC: 33.8 g/dL (ref 30.0–36.0)
MCV: 95.4 fL (ref 80.0–100.0)
Monocytes Absolute: 0.6 10*3/uL (ref 0.1–1.0)
Monocytes Relative: 16 %
Neutro Abs: 1.8 10*3/uL (ref 1.7–7.7)
Neutrophils Relative %: 50 %
Platelet Count: 170 10*3/uL (ref 150–400)
RBC: 3.5 MIL/uL — ABNORMAL LOW (ref 3.87–5.11)
RDW: 16.5 % — ABNORMAL HIGH (ref 11.5–15.5)
WBC Count: 3.5 10*3/uL — ABNORMAL LOW (ref 4.0–10.5)
nRBC: 0 % (ref 0.0–0.2)

## 2021-10-23 LAB — CMP (CANCER CENTER ONLY)
ALT: 9 U/L (ref 0–44)
AST: 15 U/L (ref 15–41)
Albumin: 4 g/dL (ref 3.5–5.0)
Alkaline Phosphatase: 72 U/L (ref 38–126)
Anion gap: 7 (ref 5–15)
BUN: 14 mg/dL (ref 6–20)
CO2: 24 mmol/L (ref 22–32)
Calcium: 9.7 mg/dL (ref 8.9–10.3)
Chloride: 106 mmol/L (ref 98–111)
Creatinine: 0.52 mg/dL (ref 0.44–1.00)
GFR, Estimated: >60 mL/min (ref >60)
Glucose, Bld: 88 mg/dL (ref 70–99)
Potassium: 3.8 mmol/L (ref 3.5–5.1)
Sodium: 137 mmol/L (ref 135–145)
Total Bilirubin: 0.2 mg/dL — ABNORMAL LOW (ref 0.3–1.2)
Total Protein: 7.3 g/dL (ref 6.5–8.1)

## 2021-10-23 LAB — TSH: TSH: 1.395 u[IU]/mL (ref 0.350–4.500)

## 2021-10-23 MED ORDER — SODIUM CHLORIDE 0.9 % IV SOLN
429.5000 mg | Freq: Once | INTRAVENOUS | Status: AC
  Administered 2021-10-23: 430 mg via INTRAVENOUS
  Filled 2021-10-23: qty 43

## 2021-10-23 MED ORDER — PALONOSETRON HCL INJECTION 0.25 MG/5ML
0.2500 mg | Freq: Once | INTRAVENOUS | Status: AC
  Administered 2021-10-23: 0.25 mg via INTRAVENOUS
  Filled 2021-10-23: qty 5

## 2021-10-23 MED ORDER — TRIAMCINOLONE ACETONIDE 0.1 % EX CREA: 1.0000 "application " | TOPICAL_CREAM | Freq: Two times a day (BID) | CUTANEOUS | 0 refills | Status: DC | PRN

## 2021-10-23 MED ORDER — CYANOCOBALAMIN 1000 MCG/ML IJ SOLN
1000.0000 ug | Freq: Once | INTRAMUSCULAR | Status: AC
  Administered 2021-10-23: 1000 ug via INTRAMUSCULAR
  Filled 2021-10-23: qty 1

## 2021-10-23 MED ORDER — SODIUM CHLORIDE 0.9 % IV SOLN
10.0000 mg | Freq: Once | INTRAVENOUS | Status: AC
  Administered 2021-10-23: 10 mg via INTRAVENOUS
  Filled 2021-10-23: qty 10

## 2021-10-23 MED ORDER — SODIUM CHLORIDE 0.9 % IV SOLN
150.0000 mg | Freq: Once | INTRAVENOUS | Status: AC
  Administered 2021-10-23: 150 mg via INTRAVENOUS
  Filled 2021-10-23: qty 150

## 2021-10-23 MED ORDER — SODIUM CHLORIDE 0.9 % IV SOLN
200.0000 mg | Freq: Once | INTRAVENOUS | Status: AC
  Administered 2021-10-23: 200 mg via INTRAVENOUS
  Filled 2021-10-23: qty 200

## 2021-10-23 MED ORDER — SODIUM CHLORIDE 0.9 % IV SOLN: Freq: Once | INTRAVENOUS | Status: AC

## 2021-10-23 MED ORDER — PROCHLORPERAZINE MALEATE 10 MG PO TABS: 10.0000 mg | ORAL_TABLET | Freq: Four times a day (QID) | ORAL | 2 refills | Status: DC | PRN

## 2021-10-23 MED ORDER — SODIUM CHLORIDE 0.9 % IV SOLN
500.0000 mg/m2 | Freq: Once | INTRAVENOUS | Status: AC
  Administered 2021-10-23: 700 mg via INTRAVENOUS
  Filled 2021-10-23: qty 20

## 2021-10-23 NOTE — Patient Instructions (Signed)
[facility]  Discharge Instructions: Thank you for choosing [facility] to provide your oncology and hematology care.   If you have a lab appointment with the [facility], please go directly to the [facility] and check in at the registration area.   Wear comfortable clothing and clothing appropriate for easy access to any Portacath or PICC line.   We strive to give you quality time with your provider. You may need to reschedule your appointment if you arrive late (15 or more minutes).  Arriving late affects you and other patients whose appointments are after yours.  Also, if you miss three or more appointments without notifying the office, you may be dismissed from the clinic at the provider's discretion.      For prescription refill requests, have your pharmacy contact our office and allow 72 hours for refills to be completed.    Today you received the following chemotherapy and/or immunotherapy agents: Pembrolizumab, Pemetrexed, Carboplatin      To help prevent nausea and vomiting after your treatment, we encourage you to take your nausea medication as directed.  BELOW ARE SYMPTOMS THAT SHOULD BE REPORTED IMMEDIATELY: *FEVER GREATER THAN 100.4 F (38 C) OR HIGHER *CHILLS OR SWEATING *NAUSEA AND VOMITING THAT IS NOT CONTROLLED WITH YOUR NAUSEA MEDICATION *UNUSUAL SHORTNESS OF BREATH *UNUSUAL BRUISING OR BLEEDING *URINARY PROBLEMS (pain or burning when urinating, or frequent urination) *BOWEL PROBLEMS (unusual diarrhea, constipation, pain near the anus) TENDERNESS IN MOUTH AND THROAT WITH OR WITHOUT PRESENCE OF ULCERS (sore throat, sores in mouth, or a toothache) UNUSUAL RASH, SWELLING OR PAIN  UNUSUAL VAGINAL DISCHARGE OR ITCHING   Items with * indicate a potential emergency and should be followed up as soon as possible or go to the Emergency Department if any problems should occur.  Please show the CHEMOTHERAPY ALERT CARD or  IMMUNOTHERAPY ALERT CARD at check-in to the Emergency Department and triage nurse.  Should you have questions after your visit or need to cancel or reschedule your appointment, please contact [facility]  Dept: [phone]  and follow the prompts.  Office hours are 8:00 a.m. to 4:30 p.m. Monday - Friday. Please note that voicemails left after 4:00 p.m. may not be returned until the following business day.  We are closed weekends and major holidays. You have access to a nurse at all times for urgent questions. Please call the main number to the clinic Dept: [phone] and follow the prompts.   For any non-urgent questions, you may also contact your provider using MyChart. We now offer e-Visits for anyone 70 and older to request care online for non-urgent symptoms. For details visit [email or web address].   Also download the MyChart app! Go to the app store, search "MyChart", open the app, select [facility], and log in with your MyChart username and password.  Due to Covid, a mask is required upon entering the hospital/clinic. If you do not have a mask, one will be given to you upon arrival. For doctor visits, patients may have 1 support Jakhiya Brower aged 68 or older with them. For treatment visits, patients cannot have anyone with them due to current Covid guidelines and our immunocompromised population.    Pembrolizumab injection What is this medication? PEMBROLIZUMAB (pem broe liz ue mab) is a monoclonal antibody. It is used to treat certain types of cancer. This medicine may be used for other purposes; ask your health care provider or pharmacist if you have questions. COMMON BRAND  NAME(S): Keytruda What should I tell my care team before I take this medication? They need to know if you have any of these conditions: autoimmune diseases like Crohn's disease, ulcerative colitis, or lupus have had or planning to have an allogeneic stem cell transplant (uses someone  else's stem cells) history of organ transplant history of chest radiation nervous system problems like myasthenia gravis or Guillain-Barre syndrome an unusual or allergic reaction to pembrolizumab, other medicines, foods, dyes, or preservatives pregnant or trying to get pregnant breast-feeding How should I use this medication? This medicine is for infusion into a vein. It is given by a health care professional in a hospital or clinic setting. A special MedGuide will be given to you before each treatment. Be sure to read this information carefully each time. Talk to your pediatrician regarding the use of this medicine in children. While this drug may be prescribed for children as young as 6 months for selected conditions, precautions do apply. Overdosage: If you think you have taken too much of this medicine contact a poison control center or emergency room at once. NOTE: This medicine is only for you. Do not share this medicine with others. What if I miss a dose? It is important not to miss your dose. Call your doctor or health care professional if you are unable to keep an appointment. What may interact with this medication? Interactions have not been studied. This list may not describe all possible interactions. Give your health care provider a list of all the medicines, herbs, non-prescription drugs, or dietary supplements you use. Also tell them if you smoke, drink alcohol, or use illegal drugs. Some items may interact with your medicine. What should I watch for while using this medication? Your condition will be monitored carefully while you are receiving this medicine. You may need blood work done while you are taking this medicine. Do not become pregnant while taking this medicine or for 4 months after stopping it. Women should inform their doctor if they wish to become pregnant or think they might be pregnant. There is a potential for serious side effects to an unborn child. Talk to your  health care professional or pharmacist for more information. Do not breast-feed an infant while taking this medicine or for 4 months after the last dose. What side effects may I notice from receiving this medication? Side effects that you should report to your doctor or health care professional as soon as possible: allergic reactions like skin rash, itching or hives, swelling of the face, lips, or tongue bloody or black, tarry breathing problems changes in vision chest pain chills confusion constipation cough diarrhea dizziness or feeling faint or lightheaded fast or irregular heartbeat fever flushing joint pain low blood counts - this medicine may decrease the number of white blood cells, red blood cells and platelets. You may be at increased risk for infections and bleeding. muscle pain muscle weakness pain, tingling, numbness in the hands or feet persistent headache redness, blistering, peeling or loosening of the skin, including inside the mouth signs and symptoms of high blood sugar such as dizziness; dry mouth; dry skin; fruity breath; nausea; stomach pain; increased hunger or thirst; increased urination signs and symptoms of kidney injury like trouble passing urine or change in the amount of urine signs and symptoms of liver injury like dark urine, light-colored stools, loss of appetite, nausea, right upper belly pain, yellowing of the eyes or skin sweating swollen lymph nodes weight loss Side effects that usually do  not require medical attention (report to your doctor or health care professional if they continue or are bothersome): decreased appetite hair loss tiredness This list may not describe all possible side effects. Call your doctor for medical advice about side effects. You may report side effects to FDA at 1-800-FDA-1088. Where should I keep my medication? This drug is given in a hospital or clinic and will not be stored at home. NOTE: This sheet is a summary. It  may not cover all possible information. If you have questions about this medicine, talk to your doctor, pharmacist, or health care provider.  2023 Elsevier/Gold Standard (2021-03-30 00:00:00)  Pemetrexed injection What is this medication? PEMETREXED (PEM e TREX ed) is a chemotherapy drug used to treat lung cancers like non-small cell lung cancer and mesothelioma. It may also be used to treat other cancers. This medicine may be used for other purposes; ask your health care provider or pharmacist if you have questions. COMMON BRAND NAME(S): Alimta, PEMFEXY What should I tell my care team before I take this medication? They need to know if you have any of these conditions: infection (especially a virus infection such as chickenpox, cold sores, or herpes) kidney disease low blood counts, like low white cell, platelet, or red cell counts lung or breathing disease, like asthma radiation therapy an unusual or allergic reaction to pemetrexed, other medicines, foods, dyes, or preservative pregnant or trying to get pregnant breast-feeding How should I use this medication? This drug is given as an infusion into a vein. It is administered in a hospital or clinic by a specially trained health care professional. Talk to your pediatrician regarding the use of this medicine in children. Special care may be needed. Overdosage: If you think you have taken too much of this medicine contact a poison control center or emergency room at once. NOTE: This medicine is only for you. Do not share this medicine with others. What if I miss a dose? It is important not to miss your dose. Call your doctor or health care professional if you are unable to keep an appointment. What may interact with this medication? This medicine may interact with the following medications: Ibuprofen This list may not describe all possible interactions. Give your health care provider a list of all the medicines, herbs, non-prescription  drugs, or dietary supplements you use. Also tell them if you smoke, drink alcohol, or use illegal drugs. Some items may interact with your medicine. What should I watch for while using this medication? Visit your doctor for checks on your progress. This drug may make you feel generally unwell. This is not uncommon, as chemotherapy can affect healthy cells as well as cancer cells. Report any side effects. Continue your course of treatment even though you feel ill unless your doctor tells you to stop. In some cases, you may be given additional medicines to help with side effects. Follow all directions for their use. Call your doctor or health care professional for advice if you get a fever, chills or sore throat, or other symptoms of a cold or flu. Do not treat yourself. This drug decreases your body's ability to fight infections. Try to avoid being around people who are sick. This medicine may increase your risk to bruise or bleed. Call your doctor or health care professional if you notice any unusual bleeding. Be careful brushing and flossing your teeth or using a toothpick because you may get an infection or bleed more easily. If you have any dental work  done, tell your dentist you are receiving this medicine. Avoid taking products that contain aspirin, acetaminophen, ibuprofen, naproxen, or ketoprofen unless instructed by your doctor. These medicines may hide a fever. Call your doctor or health care professional if you get diarrhea or mouth sores. Do not treat yourself. To protect your kidneys, drink water or other fluids as directed while you are taking this medicine. Do not become pregnant while taking this medicine or for 6 months after stopping it. Women should inform their doctor if they wish to become pregnant or think they might be pregnant. Men should not father a child while taking this medicine and for 3 months after stopping it. This may interfere with the ability to father a child. You should  talk to your doctor or health care professional if you are concerned about your fertility. There is a potential for serious side effects to an unborn child. Talk to your health care professional or pharmacist for more information. Do not breast-feed an infant while taking this medicine or for 1 week after stopping it. What side effects may I notice from receiving this medication? Side effects that you should report to your doctor or health care professional as soon as possible: allergic reactions like skin rash, itching or hives, swelling of the face, lips, or tongue breathing problems redness, blistering, peeling or loosening of the skin, including inside the mouth signs and symptoms of bleeding such as bloody or black, tarry stools; red or dark-brown urine; spitting up blood or brown material that looks like coffee grounds; red spots on the skin; unusual bruising or bleeding from the eye, gums, or nose signs and symptoms of infection like fever or chills; cough; sore throat; pain or trouble passing urine signs and symptoms of kidney injury like trouble passing urine or change in the amount of urine signs and symptoms of liver injury like dark yellow or brown urine; general ill feeling or flu-like symptoms; light-colored stools; loss of appetite; nausea; right upper belly pain; unusually weak or tired; yellowing of the eyes or skin Side effects that usually do not require medical attention (report to your doctor or health care professional if they continue or are bothersome): constipation mouth sores nausea, vomiting unusually weak or tired This list may not describe all possible side effects. Call your doctor for medical advice about side effects. You may report side effects to FDA at 1-800-FDA-1088. Where should I keep my medication? This drug is given in a hospital or clinic and will not be stored at home. NOTE: This sheet is a summary. It may not cover all possible information. If you have  questions about this medicine, talk to your doctor, pharmacist, or health care provider.  2023 Elsevier/Gold Standard (2017-06-24 00:00:00)  Carboplatin injection What is this medication? CARBOPLATIN (KAR boe pla tin) is a chemotherapy drug. It targets fast dividing cells, like cancer cells, and causes these cells to die. This medicine is used to treat ovarian cancer and many other cancers. This medicine may be used for other purposes; ask your health care provider or pharmacist if you have questions. COMMON BRAND NAME(S): Paraplatin What should I tell my care team before I take this medication? They need to know if you have any of these conditions: blood disorders hearing problems kidney disease recent or ongoing radiation therapy an unusual or allergic reaction to carboplatin, cisplatin, other chemotherapy, other medicines, foods, dyes, or preservatives pregnant or trying to get pregnant breast-feeding How should I use this medication? This drug is  usually given as an infusion into a vein. It is administered in a hospital or clinic by a specially trained health care professional. Talk to your pediatrician regarding the use of this medicine in children. Special care may be needed. Overdosage: If you think you have taken too much of this medicine contact a poison control center or emergency room at once. NOTE: This medicine is only for you. Do not share this medicine with others. What if I miss a dose? It is important not to miss a dose. Call your doctor or health care professional if you are unable to keep an appointment. What may interact with this medication? medicines for seizures medicines to increase blood counts like filgrastim, pegfilgrastim, sargramostim some antibiotics like amikacin, gentamicin, neomycin, streptomycin, tobramycin vaccines Talk to your doctor or health care professional before taking any of these  medicines: acetaminophen aspirin ibuprofen ketoprofen naproxen This list may not describe all possible interactions. Give your health care provider a list of all the medicines, herbs, non-prescription drugs, or dietary supplements you use. Also tell them if you smoke, drink alcohol, or use illegal drugs. Some items may interact with your medicine. What should I watch for while using this medication? Your condition will be monitored carefully while you are receiving this medicine. You will need important blood work done while you are taking this medicine. This drug may make you feel generally unwell. This is not uncommon, as chemotherapy can affect healthy cells as well as cancer cells. Report any side effects. Continue your course of treatment even though you feel ill unless your doctor tells you to stop. In some cases, you may be given additional medicines to help with side effects. Follow all directions for their use. Call your doctor or health care professional for advice if you get a fever, chills or sore throat, or other symptoms of a cold or flu. Do not treat yourself. This drug decreases your body's ability to fight infections. Try to avoid being around people who are sick. This medicine may increase your risk to bruise or bleed. Call your doctor or health care professional if you notice any unusual bleeding. Be careful brushing and flossing your teeth or using a toothpick because you may get an infection or bleed more easily. If you have any dental work done, tell your dentist you are receiving this medicine. Avoid taking products that contain aspirin, acetaminophen, ibuprofen, naproxen, or ketoprofen unless instructed by your doctor. These medicines may hide a fever. Do not become pregnant while taking this medicine. Women should inform their doctor if they wish to become pregnant or think they might be pregnant. There is a potential for serious side effects to an unborn child. Talk to your  health care professional or pharmacist for more information. Do not breast-feed an infant while taking this medicine. What side effects may I notice from receiving this medication? Side effects that you should report to your doctor or health care professional as soon as possible: allergic reactions like skin rash, itching or hives, swelling of the face, lips, or tongue signs of infection - fever or chills, cough, sore throat, pain or difficulty passing urine signs of decreased platelets or bleeding - bruising, pinpoint red spots on the skin, black, tarry stools, nosebleeds signs of decreased red blood cells - unusually weak or tired, fainting spells, lightheadedness breathing problems changes in hearing changes in vision chest pain high blood pressure low blood counts - This drug may decrease the number of white blood cells, red  blood cells and platelets. You may be at increased risk for infections and bleeding. nausea and vomiting pain, swelling, redness or irritation at the injection site pain, tingling, numbness in the hands or feet problems with balance, talking, walking trouble passing urine or change in the amount of urine Side effects that usually do not require medical attention (report to your doctor or health care professional if they continue or are bothersome): hair loss loss of appetite metallic taste in the mouth or changes in taste This list may not describe all possible side effects. Call your doctor for medical advice about side effects. You may report side effects to FDA at 1-800-FDA-1088. Where should I keep my medication? This drug is given in a hospital or clinic and will not be stored at home. NOTE: This sheet is a summary. It may not cover all possible information. If you have questions about this medicine, talk to your doctor, pharmacist, or health care provider.  2023 Elsevier/Gold Standard (2007-10-07 00:00:00)

## 2021-10-23 NOTE — Progress Notes (Signed)
Nutrition Assessment   Reason for Assessment: weight loss   ASSESSMENT: 56 year old female with stage IV lung cancer. She is currently receiving carboplatin/alimta/keytruda q3w (started 4/19). Patient is under the care of Dr. Julien Nordmann.  Past medical history includes COPD, asthma, CVA, PCM.  Met with patient in infusion. She reports tolerating treatment well. She denies diarrhea, constipation, vomiting. Patient reports nausea on day four after therapy. This is well controlled with compazine. Patient reports eating 5 small meals. Breakfast is typically bowl of oatmeal with carnation breakfast essential, mid day snacks including peanut butter sandwich with carnation breakfast essential, followed by jello with walnuts and pecans. Patient recalls baked potato with butter, sour cream, cheese, salad, chicken for dinner. Sometimes her husband takes her to [facility] for a steak. Patient has boost at home. She has not tried any yet. She reports drinking a lot of water. Patient stopped drinking soda after starting treatment as these no longer tasted good. Suspect some weight loss related to discontinuing intake of soda.    Nutrition Focused Physical Exam: deferred - pt wearing sweatshirt, ball cap, mask in infusion   Medications: eliquis, folic acid, compazine   Labs: reviewed    Anthropometrics:   Height: 5' Weight: 98 lb 1.6 oz  UBW: 112 lb (08/02/21) BMI: 19.16   NUTRITION DIAGNOSIS: Unintentional weight loss related to chronic illness (stage IV lung cancer, COPD) as evidenced by 14 lb (12.5%) weight loss in 11 weeks; significant   INTERVENTION:  Educated on importance of adequate calorie and protein energy intake to maintain strength/weights Continue eating small frequent meals - pt will add a bedtime snack - handout with ideas provided.  Discussed ways to add calories/protein to foods (using whole milk in oatmeal/creamy soups vs water, adding cheese and butter) Suggested soft moist high  protein foods for ease of intake - handout provided Continue drinking 2 breakfast essentials, recommend consuming in between meals vs with meals  Ensure Complete given to pt to try during infusion Discussed strategies for nausea, continue taking anti-emetics as prescribed - handout with tips provided  Contact information provided     MONITORING, EVALUATION, GOAL: Patient will tolerate increased calories and protein to minimize further weight loss    Next Visit: Wednesday July 5 during infusion

## 2021-10-30 ENCOUNTER — Ambulatory Visit: Payer: BC Managed Care – PPO | Admitting: Physician Assistant

## 2021-10-30 ENCOUNTER — Ambulatory Visit: Payer: BC Managed Care – PPO

## 2021-10-30 ENCOUNTER — Telehealth: Payer: Self-pay

## 2021-10-30 ENCOUNTER — Other Ambulatory Visit: Payer: BC Managed Care – PPO

## 2021-10-30 ENCOUNTER — Encounter: Payer: Self-pay | Admitting: *Deleted

## 2021-10-30 ENCOUNTER — Other Ambulatory Visit: Payer: Self-pay

## 2021-10-30 ENCOUNTER — Other Ambulatory Visit: Payer: Self-pay | Admitting: Physician Assistant

## 2021-10-30 ENCOUNTER — Other Ambulatory Visit: Payer: Self-pay | Admitting: Lab

## 2021-10-30 ENCOUNTER — Inpatient Hospital Stay: Payer: BC Managed Care – PPO

## 2021-10-30 DIAGNOSIS — I63511 Cerebral infarction due to unspecified occlusion or stenosis of right middle cerebral artery: Secondary | ICD-10-CM | POA: Diagnosis not present

## 2021-10-30 DIAGNOSIS — C3401 Malignant neoplasm of right main bronchus: Secondary | ICD-10-CM

## 2021-10-30 DIAGNOSIS — R634 Abnormal weight loss: Secondary | ICD-10-CM | POA: Diagnosis not present

## 2021-10-30 DIAGNOSIS — C3491 Malignant neoplasm of unspecified part of right bronchus or lung: Secondary | ICD-10-CM | POA: Diagnosis not present

## 2021-10-30 DIAGNOSIS — Z5111 Encounter for antineoplastic chemotherapy: Secondary | ICD-10-CM | POA: Diagnosis not present

## 2021-10-30 DIAGNOSIS — R21 Rash and other nonspecific skin eruption: Secondary | ICD-10-CM | POA: Diagnosis not present

## 2021-10-30 DIAGNOSIS — E538 Deficiency of other specified B group vitamins: Secondary | ICD-10-CM | POA: Diagnosis not present

## 2021-10-30 DIAGNOSIS — Z87891 Personal history of nicotine dependence: Secondary | ICD-10-CM | POA: Diagnosis not present

## 2021-10-30 DIAGNOSIS — Z7901 Long term (current) use of anticoagulants: Secondary | ICD-10-CM | POA: Diagnosis not present

## 2021-10-30 DIAGNOSIS — I959 Hypotension, unspecified: Secondary | ICD-10-CM | POA: Diagnosis not present

## 2021-10-30 LAB — CMP (CANCER CENTER ONLY)
ALT: 18 U/L (ref 0–44)
AST: 22 U/L (ref 15–41)
Albumin: 4.1 g/dL (ref 3.5–5.0)
Alkaline Phosphatase: 81 U/L (ref 38–126)
Anion gap: 5 (ref 5–15)
BUN: 18 mg/dL (ref 6–20)
CO2: 27 mmol/L (ref 22–32)
Calcium: 9.7 mg/dL (ref 8.9–10.3)
Chloride: 104 mmol/L (ref 98–111)
Creatinine: 0.52 mg/dL (ref 0.44–1.00)
GFR, Estimated: >60 mL/min (ref >60)
Glucose, Bld: 92 mg/dL (ref 70–99)
Potassium: 3.9 mmol/L (ref 3.5–5.1)
Sodium: 136 mmol/L (ref 135–145)
Total Bilirubin: 0.3 mg/dL (ref 0.3–1.2)
Total Protein: 7.5 g/dL (ref 6.5–8.1)

## 2021-10-30 LAB — CBC WITH DIFFERENTIAL (CANCER CENTER ONLY)
Abs Immature Granulocytes: 0.01 10*3/uL (ref 0.00–0.07)
Basophils Absolute: 0 10*3/uL (ref 0.0–0.1)
Basophils Relative: 1 %
Eosinophils Absolute: 0 10*3/uL (ref 0.0–0.5)
Eosinophils Relative: 2 %
HCT: 35.1 % — ABNORMAL LOW (ref 36.0–46.0)
Hemoglobin: 11.8 g/dL — ABNORMAL LOW (ref 12.0–15.0)
Immature Granulocytes: 0 %
Lymphocytes Relative: 51 %
Lymphs Abs: 1.3 10*3/uL (ref 0.7–4.0)
MCH: 32.1 pg (ref 26.0–34.0)
MCHC: 33.6 g/dL (ref 30.0–36.0)
MCV: 95.4 fL (ref 80.0–100.0)
Monocytes Absolute: 0.2 10*3/uL (ref 0.1–1.0)
Monocytes Relative: 9 %
Neutro Abs: 0.9 10*3/uL — ABNORMAL LOW (ref 1.7–7.7)
Neutrophils Relative %: 37 %
Platelet Count: 121 10*3/uL — ABNORMAL LOW (ref 150–400)
RBC: 3.68 MIL/uL — ABNORMAL LOW (ref 3.87–5.11)
RDW: 15.9 % — ABNORMAL HIGH (ref 11.5–15.5)
Smear Review: NORMAL
WBC Count: 2.5 10*3/uL — ABNORMAL LOW (ref 4.0–10.5)
nRBC: 0 % (ref 0.0–0.2)

## 2021-10-30 LAB — TSH: TSH: 1.131 u[IU]/mL (ref 0.350–4.500)

## 2021-10-30 NOTE — Telephone Encounter (Signed)
Called patient and went over neutropenic precautions since her [place] is 0.9. Patient verbalized understanding of this and had no questions or concerns.

## 2021-10-30 NOTE — Progress Notes (Signed)
I followed up on April Walsh schedule for her treatment plan. She is set up at this time.

## 2021-11-06 ENCOUNTER — Other Ambulatory Visit: Payer: Self-pay

## 2021-11-06 ENCOUNTER — Inpatient Hospital Stay: Payer: BC Managed Care – PPO

## 2021-11-06 ENCOUNTER — Telehealth: Payer: Self-pay | Admitting: Physician Assistant

## 2021-11-06 DIAGNOSIS — I959 Hypotension, unspecified: Secondary | ICD-10-CM | POA: Diagnosis not present

## 2021-11-06 DIAGNOSIS — R21 Rash and other nonspecific skin eruption: Secondary | ICD-10-CM | POA: Diagnosis not present

## 2021-11-06 DIAGNOSIS — Z87891 Personal history of nicotine dependence: Secondary | ICD-10-CM | POA: Diagnosis not present

## 2021-11-06 DIAGNOSIS — C3491 Malignant neoplasm of unspecified part of right bronchus or lung: Secondary | ICD-10-CM | POA: Diagnosis not present

## 2021-11-06 DIAGNOSIS — C3401 Malignant neoplasm of right main bronchus: Secondary | ICD-10-CM

## 2021-11-06 DIAGNOSIS — E538 Deficiency of other specified B group vitamins: Secondary | ICD-10-CM | POA: Diagnosis not present

## 2021-11-06 DIAGNOSIS — I63511 Cerebral infarction due to unspecified occlusion or stenosis of right middle cerebral artery: Secondary | ICD-10-CM | POA: Diagnosis not present

## 2021-11-06 DIAGNOSIS — Z5111 Encounter for antineoplastic chemotherapy: Secondary | ICD-10-CM | POA: Diagnosis not present

## 2021-11-06 DIAGNOSIS — R634 Abnormal weight loss: Secondary | ICD-10-CM | POA: Diagnosis not present

## 2021-11-06 DIAGNOSIS — Z7901 Long term (current) use of anticoagulants: Secondary | ICD-10-CM | POA: Diagnosis not present

## 2021-11-06 LAB — CMP (CANCER CENTER ONLY)
ALT: 15 U/L (ref 0–44)
AST: 18 U/L (ref 15–41)
Albumin: 3.8 g/dL (ref 3.5–5.0)
Alkaline Phosphatase: 69 U/L (ref 38–126)
Anion gap: 4 — ABNORMAL LOW (ref 5–15)
BUN: 19 mg/dL (ref 6–20)
CO2: 27 mmol/L (ref 22–32)
Calcium: 9.2 mg/dL (ref 8.9–10.3)
Chloride: 105 mmol/L (ref 98–111)
Creatinine: 0.61 mg/dL (ref 0.44–1.00)
GFR, Estimated: >60 mL/min (ref >60)
Glucose, Bld: 80 mg/dL (ref 70–99)
Potassium: 4.4 mmol/L (ref 3.5–5.1)
Sodium: 136 mmol/L (ref 135–145)
Total Bilirubin: 0.2 mg/dL — ABNORMAL LOW (ref 0.3–1.2)
Total Protein: 6.5 g/dL (ref 6.5–8.1)

## 2021-11-06 LAB — CBC WITH DIFFERENTIAL (CANCER CENTER ONLY)
Abs Immature Granulocytes: 0 10*3/uL (ref 0.00–0.07)
Basophils Absolute: 0 10*3/uL (ref 0.0–0.1)
Basophils Relative: 0 %
Eosinophils Absolute: 0 10*3/uL (ref 0.0–0.5)
Eosinophils Relative: 1 %
HCT: 28.7 % — ABNORMAL LOW (ref 36.0–46.0)
Hemoglobin: 9.8 g/dL — ABNORMAL LOW (ref 12.0–15.0)
Immature Granulocytes: 0 %
Lymphocytes Relative: 36 %
Lymphs Abs: 1.4 10*3/uL (ref 0.7–4.0)
MCH: 32.7 pg (ref 26.0–34.0)
MCHC: 34.1 g/dL (ref 30.0–36.0)
MCV: 95.7 fL (ref 80.0–100.0)
Monocytes Absolute: 1 10*3/uL (ref 0.1–1.0)
Monocytes Relative: 26 %
Neutro Abs: 1.4 10*3/uL — ABNORMAL LOW (ref 1.7–7.7)
Neutrophils Relative %: 37 %
Platelet Count: 42 10*3/uL — ABNORMAL LOW (ref 150–400)
RBC: 3 MIL/uL — ABNORMAL LOW (ref 3.87–5.11)
RDW: 15.9 % — ABNORMAL HIGH (ref 11.5–15.5)
Smear Review: NORMAL
WBC Count: 3.8 10*3/uL — ABNORMAL LOW (ref 4.0–10.5)
nRBC: 0 % (ref 0.0–0.2)

## 2021-11-07 ENCOUNTER — Telehealth: Payer: Self-pay

## 2021-11-07 NOTE — Telephone Encounter (Signed)
Spoke with patient regarding her low platelet count, and went over bleeding precautions with her. I informed her that we will do repeat labs on her at her next visit before she receives treatment. Patient OK with this, and had no questions or concerns.

## 2021-11-08 NOTE — Progress Notes (Signed)
[name] OFFICE PROGRESS NOTE  [name], FNP [address] [place] [address]  DIAGNOSIS: Stage IV (T2b, N2, M1 C) non-small cell lung cancer, adenocarcinoma presented with right hilar mass in addition to mediastinal and upper abdominal lymphadenopathy in addition to bilateral adrenal metastases and metastatic disease in the musculature of the lower back.  This was diagnosed in April 2023.   DETECTED ALTERATION(S) / BIOMARKER(S)     % CFDNA OR AMPLIFICATION        ASSOCIATED FDA-APPROVED THERAPIES         CLINICAL TRIAL AVAILABILITY [id number] 0.5% None     Yes [id number] (Splice Site Indel) 3.5% None     Yes   PD-L1 expression 0%  PRIOR THERAPY:  Palliative radiotherapy to the right hilar region under the care of Dr. Lisbeth Renshaw.  CURRENT THERAPY:  Systemic chemotherapy with carboplatin for AUC of 5, Alimta 500 Mg/M2 and Keytruda 200 Mg IV every 3 weeks.  First dose August 29, 2021. Status post 3 cycles.   INTERVAL HISTORY: April Walsh 56 y.o. female returns to the clinic today for a follow-up visit accompanied by her Aunt.  The patient is feeling fairly well today without any concerning complaints. The patient is currently undergoing chemotherapy and immunotherapy.  She did have some degree of cytopenias on her weekly labs.  She is currently on a blood thinner for a recent stroke.  The patient denies any abnormal bleeding or bruising in the interval since her last appointment.  Her platelet count has recovered nicely based on her lab work today. She also had some neutropenia which resolved without any G-CSF support.  She denies any recent signs or symptoms of infection including ear infections, sinus infections, sore throat, cough, skin infections, or dysuria.  The patient does note some soreness on her left forearm at a prior IV site.  There is some firmness along her blood vessel without any erythema, drainage, or swelling.  Otherwise she  tolerated her last cycle of treatment well. She denies any fever, chills, or night sweats.she gained 1 pound since her last appointment. She recently saw member the nutritionist team and is scheduled to see them for follow up today.  She reports improving shortness of breath and improvement in her cough with her inhalers that was prescribed by Dr. Lamonte Sakai.  Denies any nausea, vomiting, diarrhea, or constipation.  She denies any headache or visual changes except she had 1 episode of a headache last week for which she took [facility] powder.  She denies rash. She is here today for evaluation and repeat blood work before undergoing cycle #4.  MEDICAL HISTORY: Past Medical History:  Diagnosis Date   Anemia    Asthma    Bronchitis    Cancer ([place])    basal cell melanoma, cervical cancer   COPD (chronic obstructive pulmonary disease) ([place])    "early stages of COPD"   Headache    Lung cancer ([place]) 08/13/2021    ALLERGIES:  has No Known Allergies.  MEDICATIONS:  Current Outpatient Medications  Medication Sig Dispense Refill   apixaban (ELIQUIS) 2.5 MG TABS tablet Take 1 tablet (2.5 mg total) by mouth 2 (two) times daily. 60 tablet 0   atorvastatin (LIPITOR) 40 MG tablet Take 1 tablet (40 mg total) by mouth daily. (Patient not taking: Reported on 10/04/2021) 30 tablet 0   Budeson-Glycopyrrol-Formoterol (BREZTRI AEROSPHERE) 160-9-4.8 MCG/ACT AERO Inhale 2 puffs into the lungs in the morning and at bedtime. 10.7 g 11  folic acid (FOLVITE) 1 MG tablet TAKE 1 TABLET BY MOUTH EVERY DAY 30 tablet 2   prochlorperazine (COMPAZINE) 10 MG tablet Take 1 tablet (10 mg total) by mouth every 6 (six) hours as needed. 30 tablet 2   triamcinolone cream (KENALOG) 0.1 % Apply 1 application  topically 2 (two) times daily as needed. 80 g 0   No current facility-administered medications for this visit.    SURGICAL HISTORY:  Past Surgical History:  Procedure Laterality Date   APPENDECTOMY     1996   BREAST BIOPSY Right     2010-2015   BRONCHIAL BIOPSY  08/13/2021   Procedure: BRONCHIAL BIOPSIES;  Surgeon: Collene Gobble, MD;  Location: [facility] ENDOSCOPY;  Service: Pulmonary;;   BRONCHIAL BRUSHINGS  08/13/2021   Procedure: BRONCHIAL BRUSHINGS;  Surgeon: Collene Gobble, MD;  Location: [facility] ENDOSCOPY;  Service: Pulmonary;;   BRONCHIAL NEEDLE ASPIRATION BIOPSY  08/13/2021   Procedure: BRONCHIAL NEEDLE ASPIRATION BIOPSIES;  Surgeon: Collene Gobble, MD;  Location: [facility] ENDOSCOPY;  Service: Pulmonary;;   CESAREAN [place] Right    2 pins and screw 1999   FOOT SURGERY Left 06/21/2021   screw and 2 pins   HEMOSTASIS CONTROL  08/13/2021   Procedure: HEMOSTASIS CONTROL;  Surgeon: Collene Gobble, MD;  Location: [facility] ENDOSCOPY;  Service: Pulmonary;;   MELANOMA EXCISION Left    basal cell melanoma 2011   [place]   VIDEO BRONCHOSCOPY  08/13/2021   Procedure: VIDEO BRONCHOSCOPY WITHOUT FLUORO;  Surgeon: Collene Gobble, MD;  Location: [facility] ENDOSCOPY;  Service: Pulmonary;;   VIDEO BRONCHOSCOPY WITH ENDOBRONCHIAL ULTRASOUND N/A 08/13/2021   Procedure: VIDEO BRONCHOSCOPY WITH ENDOBRONCHIAL ULTRASOUND;  Surgeon: Collene Gobble, MD;  Location: [facility] ENDOSCOPY;  Service: Pulmonary;  Laterality: N/A;    REVIEW OF SYSTEMS:   Review of Systems  Constitutional: Negative for appetite change, chills, fatigue, and fever HENT: Negative for mouth sores, nosebleeds, sore throat and trouble swallowing.   Eyes: Negative for eye problems and icterus.  Respiratory: Positive for improving dyspnea with activities.  Positive for improving cough.  Negative for hemoptysis and wheezing.   Cardiovascular: Negative for chest pain and leg swelling.  Gastrointestinal: Negative for abdominal pain, nausea, constipation, diarrhea, and vomiting.  Genitourinary: Negative for bladder incontinence, difficulty urinating, dysuria, frequency and hematuria.   Musculoskeletal: Negative for back pain,  gait problem, neck pain and neck stiffness.  Skin: Positive for firmness and soreness and bruising over blood vessel on left forearm. Neurological: Negative for dizziness, extremity weakness, gait problem, headaches, light-headedness and seizures.  Hematological: Negative for adenopathy. Does not bruise/bleed easily.  Psychiatric/Behavioral: Negative for confusion, depression and sleep disturbance. The patient is not nervous/anxious.       PHYSICAL EXAMINATION:  Blood pressure 115/74, pulse 100, temperature (!) 97.4 F (36.3 C), temperature source Tympanic, resp. rate 16, weight 99 lb 2 oz (45 kg), SpO2 98 %.  ECOG PERFORMANCE STATUS: 1  Physical Exam  Constitutional: Oriented to person, place, and time and positive for thin appearing female and in no acute distress.  HENT:  Head: Normocephalic and atraumatic.  Mouth/Throat: Oropharynx is clear and moist. No oropharyngeal exudate.  Eyes: Conjunctivae are normal. Right eye exhibits no discharge. Left eye exhibits no discharge. No scleral icterus.  Neck: Normal range of motion. Neck supple.  Cardiovascular: Normal rate, regular rhythm, normal heart sounds and intact  distal pulses.   Pulmonary/Chest: Effort normal and breath sounds normal. No respiratory distress. No wheezes. No rales.  Abdominal: Soft. Bowel sounds are normal. Exhibits no distension and no mass. There is no tenderness.  Musculoskeletal: Normal range of motion. Exhibits no edema.  Lymphadenopathy:    No cervical adenopathy.  Neurological: Alert and oriented to person, place, and time. Exhibits normal muscle tone. Gait normal. Coordination normal.  Skin: Positive for thrombophlebitis on left forearm. Not diaphoretic. No erythema. No pallor.  Psychiatric: Mood, memory and judgment normal.  Vitals reviewed.  LABORATORY DATA: Lab Results  Component Value Date   WBC 4.5 11/14/2021   HGB 11.0 (L) 11/14/2021   HCT 32.0 (L) 11/14/2021   MCV 94.7 11/14/2021   PLT 204  11/14/2021      Chemistry      Component Value Date/Time   NA 135 11/14/2021 1010   K 4.3 11/14/2021 1010   CL 104 11/14/2021 1010   CO2 26 11/14/2021 1010   BUN 20 11/14/2021 1010   CREATININE 0.60 11/14/2021 1010      Component Value Date/Time   CALCIUM 9.7 11/14/2021 1010   ALKPHOS 80 11/14/2021 1010   AST 16 11/14/2021 1010   ALT 12 11/14/2021 1010   BILITOT 0.2 (L) 11/14/2021 1010       RADIOGRAPHIC STUDIES:  No results found.   ASSESSMENT/PLAN:  This is a very pleasant 56 years old white female with Stage IV (T2b, N2, M1 C) non-small cell lung cancer, adenocarcinoma presented with right hilar mass in addition to mediastinal and upper abdominal lymphadenopathy in addition to bilateral adrenal metastases and metastatic disease in the musculature of the lower back.  This was diagnosed in April 2023.   The molecular studies by [employer] showed no actionable mutations and the patient has negative PD-L1 expression. She is currently undergoing palliative radiotherapy to the right hilar region under the care of Dr. Lisbeth Renshaw.  She completed palliative radiation to the right hilar area under the care of Dr. Lisbeth Renshaw.  She is currently undergoing palliative systemic chemotherapy with carboplatin for an AUC of 5, Alimta 500 mg per metered squared, Keytruda 200 mg IV every 3 weeks.  She is status post 3 cycles.  Her first dose was on 08/29/2021.   The patient was seen with Dr. Julien Nordmann.  The patient did have some cytopenias following cycle #3 but her labs recovered nicely this week.  This is her last round of treatment with carboplatin.  Therefore, Dr. Julien Nordmann recommends that she proceed with cycle #4 at the same dose as scheduled today.  We will continue to monitor her labs closely on a weekly basis until her next infusion in 3 weeks.  Starting from her neck cycle treatment, she will start maintenance treatment with Alimta and Keytruda.     See her back for follow-up visit in 3 weeks for  evaluation and repeat blood work before starting cycle #5.  We will continue to monitor the patient on a weekly basis and arrange for G-CSF support if needed for neutropenia.  I will arrange for restaging CT scan the chest, abdomen, and pelvis to be performed prior to her next appointment.  She has some thrombophlebitis on her left forearm.  I did discuss a Port-A-Cath with the patient today.  She is going to think about it at this time.  I did give her handout on her AVS on thrombophlebitis.  Encouraged her to use Tylenol if needed and heating pads.  The patient was advised  to call immediately if she has any concerning symptoms in the interval. The patient voices understanding of current disease status and treatment options and is in agreement with the current care plan. All questions were answered. The patient knows to call the clinic with any problems, questions or concerns. We can certainly see the patient much sooner if necessary       Orders Placed This Encounter  Procedures   CT Chest W Contrast    Standing Status:   Future    Standing Expiration Date:   11/14/2022    Order Specific Question:   If indicated for the ordered procedure, I authorize the administration of contrast media per Radiology protocol    Answer:   Yes    Order Specific Question:   Is patient pregnant?    Answer:   No    Order Specific Question:   Preferred imaging location?    Answer:   [facility]   CT Abdomen Pelvis W Contrast    Standing Status:   Future    Standing Expiration Date:   11/14/2022    Order Specific Question:   If indicated for the ordered procedure, I authorize the administration of contrast media per Radiology protocol    Answer:   Yes    Order Specific Question:   Is patient pregnant?    Answer:   No    Order Specific Question:   Preferred imaging location?    Answer:   [facility]    Order Specific Question:   Is Oral Contrast requested for this exam?    Answer:    Yes, Per Radiology protocol      April Mccullum L Kiandre Spagnolo, PA-C 11/14/21  ADDENDUM: Hematology/Oncology Attending: I had a face-to-face encounter with the patient today.  I reviewed her records, lab and recommended her care plan.  This is a very pleasant 56 years old white female with a stage IV non-small cell lung cancer, adenocarcinoma with no actionable mutations and negative PD-L1 expression diagnosed in April 2023 status post palliative radiotherapy to the right hilar soft tissue mass.  She is currently undergoing systemic chemotherapy with carboplatin for AUC of 5, Alimta 500 Mg/M2 and Keytruda 200 Mg IV every 3 weeks status post 3 cycles. The patient has been tolerating this treatment well with no concerning adverse effect except for chemotherapy-induced pancytopenia which significantly improved today. I recommended for her to proceed with cycle #4 today as planned. We will see her back for follow-up visit in 3 weeks for evaluation with repeat CT scan of the chest, abdomen and pelvis for restaging of her disease. The patient was advised to call immediately if she has any other concerning symptoms in the interval. Disclaimer: This note was dictated with voice recognition software. Similar sounding words can inadvertently be transcribed and may be missed upon review. Eilleen Kempf, MD

## 2021-11-14 ENCOUNTER — Inpatient Hospital Stay: Payer: BC Managed Care – PPO

## 2021-11-14 ENCOUNTER — Inpatient Hospital Stay: Payer: BC Managed Care – PPO | Attending: Radiation Oncology | Admitting: Physician Assistant

## 2021-11-14 ENCOUNTER — Other Ambulatory Visit: Payer: Self-pay | Admitting: Physician Assistant

## 2021-11-14 ENCOUNTER — Other Ambulatory Visit: Payer: Self-pay

## 2021-11-14 ENCOUNTER — Inpatient Hospital Stay: Payer: BC Managed Care – PPO | Admitting: Dietician

## 2021-11-14 VITALS — BP 115/74 | HR 100 | Temp 97.4°F | Resp 16 | Wt 99.1 lb

## 2021-11-14 DIAGNOSIS — Z5112 Encounter for antineoplastic immunotherapy: Secondary | ICD-10-CM | POA: Diagnosis not present

## 2021-11-14 DIAGNOSIS — C3491 Malignant neoplasm of unspecified part of right bronchus or lung: Secondary | ICD-10-CM | POA: Diagnosis not present

## 2021-11-14 DIAGNOSIS — Z5111 Encounter for antineoplastic chemotherapy: Secondary | ICD-10-CM | POA: Diagnosis not present

## 2021-11-14 DIAGNOSIS — Z7901 Long term (current) use of anticoagulants: Secondary | ICD-10-CM | POA: Diagnosis not present

## 2021-11-14 DIAGNOSIS — R634 Abnormal weight loss: Secondary | ICD-10-CM | POA: Diagnosis not present

## 2021-11-14 DIAGNOSIS — Z923 Personal history of irradiation: Secondary | ICD-10-CM | POA: Insufficient documentation

## 2021-11-14 DIAGNOSIS — I63511 Cerebral infarction due to unspecified occlusion or stenosis of right middle cerebral artery: Secondary | ICD-10-CM | POA: Insufficient documentation

## 2021-11-14 DIAGNOSIS — C3401 Malignant neoplasm of right main bronchus: Secondary | ICD-10-CM

## 2021-11-14 DIAGNOSIS — Z87891 Personal history of nicotine dependence: Secondary | ICD-10-CM | POA: Diagnosis not present

## 2021-11-14 LAB — CMP (CANCER CENTER ONLY)
ALT: 12 U/L (ref 0–44)
AST: 16 U/L (ref 15–41)
Albumin: 4 g/dL (ref 3.5–5.0)
Alkaline Phosphatase: 80 U/L (ref 38–126)
Anion gap: 5 (ref 5–15)
BUN: 20 mg/dL (ref 6–20)
CO2: 26 mmol/L (ref 22–32)
Calcium: 9.7 mg/dL (ref 8.9–10.3)
Chloride: 104 mmol/L (ref 98–111)
Creatinine: 0.6 mg/dL (ref 0.44–1.00)
GFR, Estimated: >60 mL/min (ref >60)
Glucose, Bld: 68 mg/dL — ABNORMAL LOW (ref 70–99)
Potassium: 4.3 mmol/L (ref 3.5–5.1)
Sodium: 135 mmol/L (ref 135–145)
Total Bilirubin: 0.2 mg/dL — ABNORMAL LOW (ref 0.3–1.2)
Total Protein: 7.1 g/dL (ref 6.5–8.1)

## 2021-11-14 LAB — CBC WITH DIFFERENTIAL (CANCER CENTER ONLY)
Abs Immature Granulocytes: 0.03 10*3/uL (ref 0.00–0.07)
Basophils Absolute: 0 10*3/uL (ref 0.0–0.1)
Basophils Relative: 1 %
Eosinophils Absolute: 0 10*3/uL (ref 0.0–0.5)
Eosinophils Relative: 1 %
HCT: 32 % — ABNORMAL LOW (ref 36.0–46.0)
Hemoglobin: 11 g/dL — ABNORMAL LOW (ref 12.0–15.0)
Immature Granulocytes: 1 %
Lymphocytes Relative: 31 %
Lymphs Abs: 1.4 10*3/uL (ref 0.7–4.0)
MCH: 32.5 pg (ref 26.0–34.0)
MCHC: 34.4 g/dL (ref 30.0–36.0)
MCV: 94.7 fL (ref 80.0–100.0)
Monocytes Absolute: 1.3 10*3/uL — ABNORMAL HIGH (ref 0.1–1.0)
Monocytes Relative: 28 %
Neutro Abs: 1.8 10*3/uL (ref 1.7–7.7)
Neutrophils Relative %: 38 %
Platelet Count: 204 10*3/uL (ref 150–400)
RBC: 3.38 MIL/uL — ABNORMAL LOW (ref 3.87–5.11)
RDW: 18.1 % — ABNORMAL HIGH (ref 11.5–15.5)
Smear Review: NORMAL
WBC Count: 4.5 10*3/uL (ref 4.0–10.5)
nRBC: 0 % (ref 0.0–0.2)

## 2021-11-14 MED ORDER — SODIUM CHLORIDE 0.9 % IV SOLN
10.0000 mg | Freq: Once | INTRAVENOUS | Status: AC
  Administered 2021-11-14: 10 mg via INTRAVENOUS
  Filled 2021-11-14: qty 10

## 2021-11-14 MED ORDER — PALONOSETRON HCL INJECTION 0.25 MG/5ML
0.2500 mg | Freq: Once | INTRAVENOUS | Status: AC
  Administered 2021-11-14: 0.25 mg via INTRAVENOUS
  Filled 2021-11-14: qty 5

## 2021-11-14 MED ORDER — SODIUM CHLORIDE 0.9 % IV SOLN
200.0000 mg | Freq: Once | INTRAVENOUS | Status: AC
  Administered 2021-11-14: 200 mg via INTRAVENOUS
  Filled 2021-11-14: qty 200

## 2021-11-14 MED ORDER — SODIUM CHLORIDE 0.9 % IV SOLN: Freq: Once | INTRAVENOUS | Status: AC

## 2021-11-14 MED ORDER — SODIUM CHLORIDE 0.9 % IV SOLN
150.0000 mg | Freq: Once | INTRAVENOUS | Status: AC
  Administered 2021-11-14: 150 mg via INTRAVENOUS
  Filled 2021-11-14: qty 150

## 2021-11-14 MED ORDER — SODIUM CHLORIDE 0.9 % IV SOLN
429.5000 mg | Freq: Once | INTRAVENOUS | Status: AC
  Administered 2021-11-14: 430 mg via INTRAVENOUS
  Filled 2021-11-14: qty 43

## 2021-11-14 MED ORDER — LIDOCAINE-PRILOCAINE 2.5-2.5 % EX CREA: 1.0000 | TOPICAL_CREAM | CUTANEOUS | 2 refills | Status: DC | PRN

## 2021-11-14 MED ORDER — SODIUM CHLORIDE 0.9 % IV SOLN
500.0000 mg/m2 | Freq: Once | INTRAVENOUS | Status: AC
  Administered 2021-11-14: 700 mg via INTRAVENOUS
  Filled 2021-11-14: qty 20

## 2021-11-14 NOTE — Progress Notes (Signed)
Nutrition Follow-up:  Patient with stage IV lung cancer. She is receiving carboplatin, alimta and keytruda q3w (started 4/19).  Met with patient in infusion. She reports good appetite. Patient says her husband feeds her all the time. Patient recalls a variety of foods and is including good sources of protein. She is surprised that she only gained one pound. Patient is drinking 1-2 CIB plus bedtime snack (bowl of ice cream). She denies nausea, vomiting, diarrhea, constipation.    Medications: reviewed   Labs: glucose 68  Anthropometrics: Weight 99 lb 2 oz today stable  6/13 - 98 lb 1.6 oz    NUTRITION DIAGNOSIS: Unintentional weight loss stable    INTERVENTION:  Encouraged high calorie high protein foods to promote weight gain Continue drinking CIB twice daily Continue bedtime snack     MONITORING, EVALUATION, GOAL: weight trends, intake   NEXT VISIT: To be scheduled as needed with treatment

## 2021-11-14 NOTE — Patient Instructions (Signed)
[facility]  Discharge Instructions: Thank you for choosing [facility] to provide your oncology and hematology care.   If you have a lab appointment with the [facility], please go directly to the [facility] and check in at the registration area.   Wear comfortable clothing and clothing appropriate for easy access to any Portacath or PICC line.   We strive to give you quality time with your provider. You may need to reschedule your appointment if you arrive late (15 or more minutes).  Arriving late affects you and other patients whose appointments are after yours.  Also, if you miss three or more appointments without notifying the office, you may be dismissed from the clinic at the provider's discretion.      For prescription refill requests, have your pharmacy contact our office and allow 72 hours for refills to be completed.    Today you received the following chemotherapy and/or immunotherapy agents: Pembrolizumab, Pemetrexed, Carboplatin      To help prevent nausea and vomiting after your treatment, we encourage you to take your nausea medication as directed.  BELOW ARE SYMPTOMS THAT SHOULD BE REPORTED IMMEDIATELY: *FEVER GREATER THAN 100.4 F (38 C) OR HIGHER *CHILLS OR SWEATING *NAUSEA AND VOMITING THAT IS NOT CONTROLLED WITH YOUR NAUSEA MEDICATION *UNUSUAL SHORTNESS OF BREATH *UNUSUAL BRUISING OR BLEEDING *URINARY PROBLEMS (pain or burning when urinating, or frequent urination) *BOWEL PROBLEMS (unusual diarrhea, constipation, pain near the anus) TENDERNESS IN MOUTH AND THROAT WITH OR WITHOUT PRESENCE OF ULCERS (sore throat, sores in mouth, or a toothache) UNUSUAL RASH, SWELLING OR PAIN  UNUSUAL VAGINAL DISCHARGE OR ITCHING   Items with * indicate a potential emergency and should be followed up as soon as possible or go to the Emergency Department if any problems should occur.  Please show the CHEMOTHERAPY ALERT CARD or  IMMUNOTHERAPY ALERT CARD at check-in to the Emergency Department and triage nurse.  Should you have questions after your visit or need to cancel or reschedule your appointment, please contact [facility]  Dept: [phone]  and follow the prompts.  Office hours are 8:00 a.m. to 4:30 p.m. Monday - Friday. Please note that voicemails left after 4:00 p.m. may not be returned until the following business day.  We are closed weekends and major holidays. You have access to a nurse at all times for urgent questions. Please call the main number to the clinic Dept: [phone] and follow the prompts.   For any non-urgent questions, you may also contact your provider using MyChart. We now offer e-Visits for anyone 29 and older to request care online for non-urgent symptoms. For details visit [email or web address].   Also download the MyChart app! Go to the app store, search "MyChart", open the app, select [facility], and log in with your MyChart username and password.  Due to Covid, a mask is required upon entering the hospital/clinic. If you do not have a mask, one will be given to you upon arrival. For doctor visits, patients may have 1 support [place] Hijazi aged 27 or older with them. For treatment visits, patients cannot have anyone with them due to current Covid guidelines and our immunocompromised population.    Pembrolizumab injection What is this medication? PEMBROLIZUMAB (pem broe liz ue mab) is a monoclonal antibody. It is used to treat certain types of cancer. This medicine may be used for other purposes; ask your health care provider or pharmacist if you have questions. COMMON BRAND  NAME(S): Keytruda What should I tell my care team before I take this medication? They need to know if you have any of these conditions: autoimmune diseases like Crohn's disease, ulcerative colitis, or lupus have had or planning to have an allogeneic stem cell transplant (uses someone  else's stem cells) history of organ transplant history of chest radiation nervous system problems like myasthenia gravis or Guillain-Barre syndrome an unusual or allergic reaction to pembrolizumab, other medicines, foods, dyes, or preservatives pregnant or trying to get pregnant breast-feeding How should I use this medication? This medicine is for infusion into a vein. It is given by a health care professional in a hospital or clinic setting. A special MedGuide will be given to you before each treatment. Be sure to read this information carefully each time. Talk to your pediatrician regarding the use of this medicine in children. While this drug may be prescribed for children as young as 6 months for selected conditions, precautions do apply. Overdosage: If you think you have taken too much of this medicine contact a poison control center or emergency room at once. NOTE: This medicine is only for you. Do not share this medicine with others. What if I miss a dose? It is important not to miss your dose. Call your doctor or health care professional if you are unable to keep an appointment. What may interact with this medication? Interactions have not been studied. This list may not describe all possible interactions. Give your health care provider a list of all the medicines, herbs, non-prescription drugs, or dietary supplements you use. Also tell them if you smoke, drink alcohol, or use illegal drugs. Some items may interact with your medicine. What should I watch for while using this medication? Your condition will be monitored carefully while you are receiving this medicine. You may need blood work done while you are taking this medicine. Do not become pregnant while taking this medicine or for 4 months after stopping it. Women should inform their doctor if they wish to become pregnant or think they might be pregnant. There is a potential for serious side effects to an unborn child. Talk to your  health care professional or pharmacist for more information. Do not breast-feed an infant while taking this medicine or for 4 months after the last dose. What side effects may I notice from receiving this medication? Side effects that you should report to your doctor or health care professional as soon as possible: allergic reactions like skin rash, itching or hives, swelling of the face, lips, or tongue bloody or black, tarry breathing problems changes in vision chest pain chills confusion constipation cough diarrhea dizziness or feeling faint or lightheaded fast or irregular heartbeat fever flushing joint pain low blood counts - this medicine may decrease the number of white blood cells, red blood cells and platelets. You may be at increased risk for infections and bleeding. muscle pain muscle weakness pain, tingling, numbness in the hands or feet persistent headache redness, blistering, peeling or loosening of the skin, including inside the mouth signs and symptoms of high blood sugar such as dizziness; dry mouth; dry skin; fruity breath; nausea; stomach pain; increased hunger or thirst; increased urination signs and symptoms of kidney injury like trouble passing urine or change in the amount of urine signs and symptoms of liver injury like dark urine, light-colored stools, loss of appetite, nausea, right upper belly pain, yellowing of the eyes or skin sweating swollen lymph nodes weight loss Side effects that usually do  not require medical attention (report to your doctor or health care professional if they continue or are bothersome): decreased appetite hair loss tiredness This list may not describe all possible side effects. Call your doctor for medical advice about side effects. You may report side effects to FDA at 1-800-FDA-1088. Where should I keep my medication? This drug is given in a hospital or clinic and will not be stored at home. NOTE: This sheet is a summary. It  may not cover all possible information. If you have questions about this medicine, talk to your doctor, pharmacist, or health care provider.  2023 Elsevier/Gold Standard (2021-03-30 00:00:00)  Pemetrexed injection What is this medication? PEMETREXED (PEM e TREX ed) is a chemotherapy drug used to treat lung cancers like non-small cell lung cancer and mesothelioma. It may also be used to treat other cancers. This medicine may be used for other purposes; ask your health care provider or pharmacist if you have questions. COMMON BRAND NAME(S): Alimta, PEMFEXY What should I tell my care team before I take this medication? They need to know if you have any of these conditions: infection (especially a virus infection such as chickenpox, cold sores, or herpes) kidney disease low blood counts, like low white cell, platelet, or red cell counts lung or breathing disease, like asthma radiation therapy an unusual or allergic reaction to pemetrexed, other medicines, foods, dyes, or preservative pregnant or trying to get pregnant breast-feeding How should I use this medication? This drug is given as an infusion into a vein. It is administered in a hospital or clinic by a specially trained health care professional. Talk to your pediatrician regarding the use of this medicine in children. Special care may be needed. Overdosage: If you think you have taken too much of this medicine contact a poison control center or emergency room at once. NOTE: This medicine is only for you. Do not share this medicine with others. What if I miss a dose? It is important not to miss your dose. Call your doctor or health care professional if you are unable to keep an appointment. What may interact with this medication? This medicine may interact with the following medications: Ibuprofen This list may not describe all possible interactions. Give your health care provider a list of all the medicines, herbs, non-prescription  drugs, or dietary supplements you use. Also tell them if you smoke, drink alcohol, or use illegal drugs. Some items may interact with your medicine. What should I watch for while using this medication? Visit your doctor for checks on your progress. This drug may make you feel generally unwell. This is not uncommon, as chemotherapy can affect healthy cells as well as cancer cells. Report any side effects. Continue your course of treatment even though you feel ill unless your doctor tells you to stop. In some cases, you may be given additional medicines to help with side effects. Follow all directions for their use. Call your doctor or health care professional for advice if you get a fever, chills or sore throat, or other symptoms of a cold or flu. Do not treat yourself. This drug decreases your body's ability to fight infections. Try to avoid being around people who are sick. This medicine may increase your risk to bruise or bleed. Call your doctor or health care professional if you notice any unusual bleeding. Be careful brushing and flossing your teeth or using a toothpick because you may get an infection or bleed more easily. If you have any dental work  done, tell your dentist you are receiving this medicine. Avoid taking products that contain aspirin, acetaminophen, ibuprofen, naproxen, or ketoprofen unless instructed by your doctor. These medicines may hide a fever. Call your doctor or health care professional if you get diarrhea or mouth sores. Do not treat yourself. To protect your kidneys, drink water or other fluids as directed while you are taking this medicine. Do not become pregnant while taking this medicine or for 6 months after stopping it. Women should inform their doctor if they wish to become pregnant or think they might be pregnant. Men should not father a child while taking this medicine and for 3 months after stopping it. This may interfere with the ability to father a child. You should  talk to your doctor or health care professional if you are concerned about your fertility. There is a potential for serious side effects to an unborn child. Talk to your health care professional or pharmacist for more information. Do not breast-feed an infant while taking this medicine or for 1 week after stopping it. What side effects may I notice from receiving this medication? Side effects that you should report to your doctor or health care professional as soon as possible: allergic reactions like skin rash, itching or hives, swelling of the face, lips, or tongue breathing problems redness, blistering, peeling or loosening of the skin, including inside the mouth signs and symptoms of bleeding such as bloody or black, tarry stools; red or dark-brown urine; spitting up blood or brown material that looks like coffee grounds; red spots on the skin; unusual bruising or bleeding from the eye, gums, or nose signs and symptoms of infection like fever or chills; cough; sore throat; pain or trouble passing urine signs and symptoms of kidney injury like trouble passing urine or change in the amount of urine signs and symptoms of liver injury like dark yellow or brown urine; general ill feeling or flu-like symptoms; light-colored stools; loss of appetite; nausea; right upper belly pain; unusually weak or tired; yellowing of the eyes or skin Side effects that usually do not require medical attention (report to your doctor or health care professional if they continue or are bothersome): constipation mouth sores nausea, vomiting unusually weak or tired This list may not describe all possible side effects. Call your doctor for medical advice about side effects. You may report side effects to FDA at 1-800-FDA-1088. Where should I keep my medication? This drug is given in a hospital or clinic and will not be stored at home. NOTE: This sheet is a summary. It may not cover all possible information. If you have  questions about this medicine, talk to your doctor, pharmacist, or health care provider.  2023 Elsevier/Gold Standard (2017-06-24 00:00:00)  Carboplatin injection What is this medication? CARBOPLATIN (KAR boe pla tin) is a chemotherapy drug. It targets fast dividing cells, like cancer cells, and causes these cells to die. This medicine is used to treat ovarian cancer and many other cancers. This medicine may be used for other purposes; ask your health care provider or pharmacist if you have questions. COMMON BRAND NAME(S): Paraplatin What should I tell my care team before I take this medication? They need to know if you have any of these conditions: blood disorders hearing problems kidney disease recent or ongoing radiation therapy an unusual or allergic reaction to carboplatin, cisplatin, other chemotherapy, other medicines, foods, dyes, or preservatives pregnant or trying to get pregnant breast-feeding How should I use this medication? This drug is  usually given as an infusion into a vein. It is administered in a hospital or clinic by a specially trained health care professional. Talk to your pediatrician regarding the use of this medicine in children. Special care may be needed. Overdosage: If you think you have taken too much of this medicine contact a poison control center or emergency room at once. NOTE: This medicine is only for you. Do not share this medicine with others. What if I miss a dose? It is important not to miss a dose. Call your doctor or health care professional if you are unable to keep an appointment. What may interact with this medication? medicines for seizures medicines to increase blood counts like filgrastim, pegfilgrastim, sargramostim some antibiotics like amikacin, gentamicin, neomycin, streptomycin, tobramycin vaccines Talk to your doctor or health care professional before taking any of these  medicines: acetaminophen aspirin ibuprofen ketoprofen naproxen This list may not describe all possible interactions. Give your health care provider a list of all the medicines, herbs, non-prescription drugs, or dietary supplements you use. Also tell them if you smoke, drink alcohol, or use illegal drugs. Some items may interact with your medicine. What should I watch for while using this medication? Your condition will be monitored carefully while you are receiving this medicine. You will need important blood work done while you are taking this medicine. This drug may make you feel generally unwell. This is not uncommon, as chemotherapy can affect healthy cells as well as cancer cells. Report any side effects. Continue your course of treatment even though you feel ill unless your doctor tells you to stop. In some cases, you may be given additional medicines to help with side effects. Follow all directions for their use. Call your doctor or health care professional for advice if you get a fever, chills or sore throat, or other symptoms of a cold or flu. Do not treat yourself. This drug decreases your body's ability to fight infections. Try to avoid being around people who are sick. This medicine may increase your risk to bruise or bleed. Call your doctor or health care professional if you notice any unusual bleeding. Be careful brushing and flossing your teeth or using a toothpick because you may get an infection or bleed more easily. If you have any dental work done, tell your dentist you are receiving this medicine. Avoid taking products that contain aspirin, acetaminophen, ibuprofen, naproxen, or ketoprofen unless instructed by your doctor. These medicines may hide a fever. Do not become pregnant while taking this medicine. Women should inform their doctor if they wish to become pregnant or think they might be pregnant. There is a potential for serious side effects to an unborn child. Talk to your  health care professional or pharmacist for more information. Do not breast-feed an infant while taking this medicine. What side effects may I notice from receiving this medication? Side effects that you should report to your doctor or health care professional as soon as possible: allergic reactions like skin rash, itching or hives, swelling of the face, lips, or tongue signs of infection - fever or chills, cough, sore throat, pain or difficulty passing urine signs of decreased platelets or bleeding - bruising, pinpoint red spots on the skin, black, tarry stools, nosebleeds signs of decreased red blood cells - unusually weak or tired, fainting spells, lightheadedness breathing problems changes in hearing changes in vision chest pain high blood pressure low blood counts - This drug may decrease the number of white blood cells, red  blood cells and platelets. You may be at increased risk for infections and bleeding. nausea and vomiting pain, swelling, redness or irritation at the injection site pain, tingling, numbness in the hands or feet problems with balance, talking, walking trouble passing urine or change in the amount of urine Side effects that usually do not require medical attention (report to your doctor or health care professional if they continue or are bothersome): hair loss loss of appetite metallic taste in the mouth or changes in taste This list may not describe all possible side effects. Call your doctor for medical advice about side effects. You may report side effects to FDA at 1-800-FDA-1088. Where should I keep my medication? This drug is given in a hospital or clinic and will not be stored at home. NOTE: This sheet is a summary. It may not cover all possible information. If you have questions about this medicine, talk to your doctor, pharmacist, or health care provider.  2023 Elsevier/Gold Standard (2007-10-07 00:00:00)

## 2021-11-21 ENCOUNTER — Inpatient Hospital Stay: Payer: BC Managed Care – PPO

## 2021-11-21 ENCOUNTER — Other Ambulatory Visit: Payer: Self-pay

## 2021-11-21 ENCOUNTER — Telehealth: Payer: Self-pay | Admitting: Physician Assistant

## 2021-11-21 DIAGNOSIS — R634 Abnormal weight loss: Secondary | ICD-10-CM | POA: Diagnosis not present

## 2021-11-21 DIAGNOSIS — I63511 Cerebral infarction due to unspecified occlusion or stenosis of right middle cerebral artery: Secondary | ICD-10-CM | POA: Diagnosis not present

## 2021-11-21 DIAGNOSIS — Z87891 Personal history of nicotine dependence: Secondary | ICD-10-CM | POA: Diagnosis not present

## 2021-11-21 DIAGNOSIS — C3401 Malignant neoplasm of right main bronchus: Secondary | ICD-10-CM

## 2021-11-21 DIAGNOSIS — C3491 Malignant neoplasm of unspecified part of right bronchus or lung: Secondary | ICD-10-CM | POA: Diagnosis not present

## 2021-11-21 DIAGNOSIS — Z7901 Long term (current) use of anticoagulants: Secondary | ICD-10-CM | POA: Diagnosis not present

## 2021-11-21 DIAGNOSIS — Z923 Personal history of irradiation: Secondary | ICD-10-CM | POA: Diagnosis not present

## 2021-11-21 DIAGNOSIS — Z5111 Encounter for antineoplastic chemotherapy: Secondary | ICD-10-CM | POA: Diagnosis not present

## 2021-11-21 DIAGNOSIS — R21 Rash and other nonspecific skin eruption: Secondary | ICD-10-CM

## 2021-11-21 LAB — CMP (CANCER CENTER ONLY)
ALT: 34 U/L (ref 0–44)
AST: 39 U/L (ref 15–41)
Albumin: 3.6 g/dL (ref 3.5–5.0)
Alkaline Phosphatase: 70 U/L (ref 38–126)
Anion gap: 7 (ref 5–15)
BUN: 18 mg/dL (ref 6–20)
CO2: 23 mmol/L (ref 22–32)
Calcium: 9.4 mg/dL (ref 8.9–10.3)
Chloride: 106 mmol/L (ref 98–111)
Creatinine: 0.47 mg/dL (ref 0.44–1.00)
GFR, Estimated: >60 mL/min (ref >60)
Glucose, Bld: 90 mg/dL (ref 70–99)
Potassium: 4.4 mmol/L (ref 3.5–5.1)
Sodium: 136 mmol/L (ref 135–145)
Total Bilirubin: 0.4 mg/dL (ref 0.3–1.2)
Total Protein: 7.1 g/dL (ref 6.5–8.1)

## 2021-11-21 LAB — CBC WITH DIFFERENTIAL (CANCER CENTER ONLY)
Abs Immature Granulocytes: 0 10*3/uL (ref 0.00–0.07)
Basophils Absolute: 0 10*3/uL (ref 0.0–0.1)
Basophils Relative: 1 %
Eosinophils Absolute: 0 10*3/uL (ref 0.0–0.5)
Eosinophils Relative: 1 %
HCT: 30.4 % — ABNORMAL LOW (ref 36.0–46.0)
Hemoglobin: 10.6 g/dL — ABNORMAL LOW (ref 12.0–15.0)
Immature Granulocytes: 0 %
Lymphocytes Relative: 50 %
Lymphs Abs: 1.3 10*3/uL (ref 0.7–4.0)
MCH: 33.3 pg (ref 26.0–34.0)
MCHC: 34.9 g/dL (ref 30.0–36.0)
MCV: 95.6 fL (ref 80.0–100.0)
Monocytes Absolute: 0.4 10*3/uL (ref 0.1–1.0)
Monocytes Relative: 15 %
Neutro Abs: 0.8 10*3/uL — ABNORMAL LOW (ref 1.7–7.7)
Neutrophils Relative %: 33 %
Platelet Count: 156 10*3/uL (ref 150–400)
RBC: 3.18 MIL/uL — ABNORMAL LOW (ref 3.87–5.11)
RDW: 17.7 % — ABNORMAL HIGH (ref 11.5–15.5)
Smear Review: NORMAL
WBC Count: 2.5 10*3/uL — ABNORMAL LOW (ref 4.0–10.5)
nRBC: 0 % (ref 0.0–0.2)

## 2021-11-21 LAB — TSH: TSH: 0.888 u[IU]/mL (ref 0.350–4.500)

## 2021-11-21 NOTE — Telephone Encounter (Signed)
I called the patient to let her know that she has some mild neutropenia at this time.  I reviewed neutropenic precautions with her.  She does not need G-CSF injections at this time but we will monitor closely on a weekly basis.  He denies any signs and symptoms of infection at this time and advised her should she develop any new or worsening symptoms to call the clinic for evaluation.  She expressed understanding.

## 2021-11-23 ENCOUNTER — Other Ambulatory Visit: Payer: Self-pay | Admitting: Student

## 2021-11-23 DIAGNOSIS — Z5111 Encounter for antineoplastic chemotherapy: Secondary | ICD-10-CM

## 2021-11-26 ENCOUNTER — Other Ambulatory Visit: Payer: Self-pay | Admitting: Physician Assistant

## 2021-11-26 ENCOUNTER — Ambulatory Visit (HOSPITAL_COMMUNITY)
Admission: RE | Admit: 2021-11-26 | Discharge: 2021-11-26 | Disposition: A | Discharge status: Home / Self Care | Payer: BC Managed Care – PPO | Source: Ambulatory Visit | Attending: Physician Assistant | Admitting: Physician Assistant

## 2021-11-26 ENCOUNTER — Encounter (HOSPITAL_COMMUNITY): Payer: Self-pay

## 2021-11-26 DIAGNOSIS — Z87891 Personal history of nicotine dependence: Secondary | ICD-10-CM | POA: Diagnosis not present

## 2021-11-26 DIAGNOSIS — J449 Chronic obstructive pulmonary disease, unspecified: Secondary | ICD-10-CM | POA: Insufficient documentation

## 2021-11-26 DIAGNOSIS — Z5111 Encounter for antineoplastic chemotherapy: Secondary | ICD-10-CM

## 2021-11-26 DIAGNOSIS — C3491 Malignant neoplasm of unspecified part of right bronchus or lung: Secondary | ICD-10-CM | POA: Insufficient documentation

## 2021-11-26 DIAGNOSIS — Z452 Encounter for adjustment and management of vascular access device: Secondary | ICD-10-CM | POA: Diagnosis not present

## 2021-11-26 DIAGNOSIS — C3490 Malignant neoplasm of unspecified part of unspecified bronchus or lung: Secondary | ICD-10-CM | POA: Diagnosis not present

## 2021-11-26 HISTORY — PX: IR IMAGING GUIDED PORT INSERTION: IMG5740

## 2021-11-26 MED ORDER — MIDAZOLAM HCL 2 MG/2ML IJ SOLN
INTRAMUSCULAR | Status: AC
  Filled 2021-11-26: qty 4

## 2021-11-26 MED ORDER — MIDAZOLAM HCL 2 MG/2ML IJ SOLN
INTRAMUSCULAR | Status: AC | PRN
  Administered 2021-11-26: 1 mg via INTRAVENOUS

## 2021-11-26 MED ORDER — FENTANYL CITRATE (PF) 100 MCG/2ML IJ SOLN
INTRAMUSCULAR | Status: AC | PRN
  Administered 2021-11-26: 50 ug via INTRAVENOUS

## 2021-11-26 MED ORDER — SODIUM CHLORIDE 0.9 % IV SOLN: INTRAVENOUS | Status: DC

## 2021-11-26 MED ORDER — LIDOCAINE-EPINEPHRINE 1 %-1:100000 IJ SOLN
INTRAMUSCULAR | Status: AC
  Filled 2021-11-26: qty 1

## 2021-11-26 MED ORDER — FENTANYL CITRATE (PF) 100 MCG/2ML IJ SOLN
INTRAMUSCULAR | Status: AC
  Filled 2021-11-26: qty 4

## 2021-11-26 MED ORDER — HEPARIN SOD (PORK) LOCK FLUSH 100 UNIT/ML IV SOLN
INTRAVENOUS | Status: AC
  Filled 2021-11-26: qty 5

## 2021-11-26 NOTE — Procedures (Signed)
Interventional Radiology Procedure Note ° °Procedure: Single Lumen Power Port Placement   ° °Access:  Right internal jugular vein ° °Findings: Catheter tip positioned at cavoatrial junction. Port is ready for immediate use.  ° °Complications: None ° °EBL: < 10 mL ° °Recommendations:  °- Ok to shower in 24 hours °- Do not submerge for 7 days °- Routine line care  ° ° °Ivelisse Culverhouse, MD ° ° ° °

## 2021-11-26 NOTE — Discharge Instructions (Addendum)
For questions /concerns may call Interventional Radiology at [phone] or  Interventional Radiology clinic [phone]   You may remove your dressing and shower tomorrow afternoon after 4pm.   Please remove the dressing before you shower.     DO NOT use EMLA cream for 2 weeks after port placement as the cream will remove surgical glue on your incision.

## 2021-11-26 NOTE — H&P (Signed)
Chief Complaint: Patient was seen in consultation today for stage IV non-small cell lung cancer at the request of Heilingoetter,Cassandra L  Referring Physician(s): Heilingoetter,Cassandra L  Supervising Physician: Ruthann Cancer  Patient Status: [facility] - Out-pt  History of Present Illness: April Walsh is a 56 y.o. female w/ PMH of anemia, asthma, COPD, CVA, cervical cancer, basal cell melanoma and stage IV non-small cell lung cancer. Pt is followed by oncology and currently undergoing chemotherapy for lung cancer diagnosed April 2023. Pt was referred to IR for placement of tunneled catheter with port placement by Cassandra Heilingoetter, PA.   Past Medical History:  Diagnosis Date   Anemia    Asthma    Bronchitis    Cancer ([place])    basal cell melanoma, cervical cancer   COPD (chronic obstructive pulmonary disease) ([place])    "early stages of COPD"   Headache    Lung cancer ([place]) 08/13/2021    Past Surgical History:  Procedure Laterality Date   APPENDECTOMY     1996   BREAST BIOPSY Right    2010-2015   BRONCHIAL BIOPSY  08/13/2021   Procedure: BRONCHIAL BIOPSIES;  Surgeon: Collene Gobble, MD;  Location: [facility] ENDOSCOPY;  Service: Pulmonary;;   BRONCHIAL BRUSHINGS  08/13/2021   Procedure: BRONCHIAL BRUSHINGS;  Surgeon: Collene Gobble, MD;  Location: [facility] ENDOSCOPY;  Service: Pulmonary;;   BRONCHIAL NEEDLE ASPIRATION BIOPSY  08/13/2021   Procedure: BRONCHIAL NEEDLE ASPIRATION BIOPSIES;  Surgeon: Collene Gobble, MD;  Location: [facility] ENDOSCOPY;  Service: Pulmonary;;   CESAREAN [place] Right    2 pins and screw 1999   FOOT SURGERY Left 06/21/2021   screw and 2 pins   HEMOSTASIS CONTROL  08/13/2021   Procedure: HEMOSTASIS CONTROL;  Surgeon: Collene Gobble, MD;  Location: [facility] ENDOSCOPY;  Service: Pulmonary;;   MELANOMA EXCISION Left    basal cell melanoma 2011   [place]  08/13/2021    Procedure: VIDEO BRONCHOSCOPY WITHOUT FLUORO;  Surgeon: Collene Gobble, MD;  Location: [facility] ENDOSCOPY;  Service: Pulmonary;;   VIDEO BRONCHOSCOPY WITH ENDOBRONCHIAL ULTRASOUND N/A 08/13/2021   Procedure: VIDEO BRONCHOSCOPY WITH ENDOBRONCHIAL ULTRASOUND;  Surgeon: Collene Gobble, MD;  Location: [place] ENDOSCOPY;  Service: Pulmonary;  Laterality: N/A;    Allergies: Patient has no known allergies.  Medications: Prior to Admission medications   Medication Sig Start Date End Date Taking? Authorizing Provider  apixaban (ELIQUIS) 2.5 MG TABS tablet Take 1 tablet (2.5 mg total) by mouth 2 (two) times daily. 09/12/21  Yes Patrecia Pour, MD  Budeson-Glycopyrrol-Formoterol (BREZTRI AEROSPHERE) 160-9-4.8 MCG/ACT AERO Inhale 2 puffs into the lungs in the morning and at bedtime. 10/09/21  Yes Byrum, Rose Fillers, MD  folic acid (FOLVITE) 1 MG tablet TAKE 1 TABLET BY MOUTH EVERY DAY 10/30/21  Yes Heilingoetter, Cassandra L, PA-C  prochlorperazine (COMPAZINE) 10 MG tablet Take 1 tablet (10 mg total) by mouth every 6 (six) hours as needed. 10/23/21  Yes Heilingoetter, Cassandra L, PA-C  atorvastatin (LIPITOR) 40 MG tablet Take 1 tablet (40 mg total) by mouth daily. Patient not taking: Reported on 10/04/2021 09/13/21   Patrecia Pour, MD  lidocaine-prilocaine (EMLA) cream Apply 1 Application topically as needed. 11/14/21   Heilingoetter, Cassandra L, PA-C  triamcinolone cream (KENALOG) 0.1 % Apply 1 application  topically 2 (two) times daily as needed. 10/23/21  Heilingoetter, Cassandra L, PA-C     Family History  Problem Relation Age of Onset   Brain cancer Maternal Aunt    Throat cancer Maternal Aunt    Lung cancer Paternal Uncle        he smoked   Lung cancer Paternal Uncle        he smoked   Ovarian cancer Maternal Grandmother    Lung cancer Cousin     Social History   Socioeconomic History   Marital status: Married    Spouse name: Not on file   Number of children: Not on file   Years of education: Not on file    Highest education level: Not on file  Occupational History   Not on file  Tobacco Use   Smoking status: Former    Packs/day: 0.50    Years: 32.00    Total pack years: 16.00    Types: Cigarettes    Passive exposure: Past   Smokeless tobacco: Never   Tobacco comments:    1/2 pack smoked daily ARJ, RN 08/02/21  Vaping Use   Vaping Use: Never used  Substance and Sexual Activity   Alcohol use: Never   Drug use: Never   Sexual activity: Not on file  Other Topics Concern   Not on file  Social History Narrative   Not on file   Social Determinants of Health   Financial Resource Strain: Not on file  Food Insecurity: Not on file  Transportation Needs: Not on file  Physical Activity: Not on file  Stress: Not on file  Social Connections: Not on file    Review of Systems: A 12 point ROS discussed and pertinent positives are indicated in the HPI above.  All other systems are negative.  Review of Systems  Constitutional:  Positive for fatigue. Negative for appetite change, chills and fever.  Respiratory:  Negative for shortness of breath.   Cardiovascular:  Negative for chest pain and leg swelling.  Gastrointestinal:  Negative for abdominal pain, nausea and vomiting.  Neurological:  Negative for dizziness, weakness and headaches.    Vital Signs: BP 112/79 (BP Location: Right Arm)   Pulse 88   Temp 97.8 F (36.6 C) (Oral)   Resp 15   SpO2 99%     Physical Exam Constitutional:      General: She is not in acute distress.    Appearance: Normal appearance. She is not ill-appearing.  HENT:     Head: Normocephalic and atraumatic.     Mouth/Throat:     Mouth: Mucous membranes are dry.     Pharynx: Oropharynx is clear.  Eyes:     Extraocular Movements: Extraocular movements intact.     Pupils: Pupils are equal, round, and reactive to light.  Cardiovascular:     Rate and Rhythm: Normal rate and regular rhythm.     Pulses: Normal pulses.     Heart sounds: Normal heart  sounds.  Pulmonary:     Effort: Pulmonary effort is normal. No respiratory distress.     Breath sounds: Wheezing present.     Comments: Wheezing to RUL Abdominal:     General: Bowel sounds are normal. There is no distension.     Palpations: Abdomen is soft.     Tenderness: There is no abdominal tenderness. There is no guarding.  Musculoskeletal:     Right lower leg: No edema.     Left lower leg: No edema.  Skin:    General: Skin is warm and dry.  Neurological:     Mental Status: She is alert and oriented to person, place, and time.  Psychiatric:        Mood and Affect: Mood normal.        Behavior: Behavior normal.        Thought Content: Thought content normal.        Judgment: Judgment normal.     Imaging: No results found.  Labs:  CBC: Recent Labs    10/30/21 1330 11/06/21 1320 11/14/21 1010 11/21/21 1334  WBC 2.5* 3.8* 4.5 2.5*  HGB 11.8* 9.8* 11.0* 10.6*  HCT 35.1* 28.7* 32.0* 30.4*  PLT 121* 42* 204 156    COAGS: Recent Labs    09/11/21 1921  INR 1.0  APTT 29    BMP: Recent Labs    10/30/21 1330 11/06/21 1320 11/14/21 1010 11/21/21 1334  NA 136 136 135 136  K 3.9 4.4 4.3 4.4  CL 104 105 104 106  CO2 27 27 26 23   GLUCOSE 92 80 68* 90  BUN 18 19 20 18   CALCIUM 9.7 9.2 9.7 9.4  CREATININE 0.52 0.61 0.60 0.47  GFRNONAA >60 >60 >60 >60    LIVER FUNCTION TESTS: Recent Labs    10/30/21 1330 11/06/21 1320 11/14/21 1010 11/21/21 1334  BILITOT 0.3 0.2* 0.2* 0.4  AST 22 18 16  39  ALT 18 15 12  34  ALKPHOS 81 69 80 70  PROT 7.5 6.5 7.1 7.1  ALBUMIN 4.1 3.8 4.0 3.6    TUMOR MARKERS: No results for input(s): "AFPTM", "CEA", "CA199", "CHROMGRNA" in the last 8760 hours.  Assessment and Plan: History of anemia, asthma, COPD, CVA, cervical cancer, basal cell melanoma and stage IV non-small cell lung cancer. Pt is followed by oncology and currently undergoing chemotherapy for lung cancer diagnosed April 2023. Pt was referred to IR for placement  of tunneled catheter with port placement by Cassandra Heilingoetter, PA.   Pt sitting upright on stretcher watching TV.  She is A&O, calm and pleasant.  She is in no distress.  Pt states she is NPO per order.   Risks and benefits of image guided  tunneled catheter with port placement with moderate sedation was discussed with the patient including, but not limited to bleeding, infection, pneumothorax, or fibrin sheath development and need for additional procedures.  All of the patient's questions were answered, patient is agreeable to proceed. Consent signed and in chart.   Thank you for this interesting consult.  I greatly enjoyed meeting Keisha Amer and look forward to participating in their care.  A copy of this report was sent to the requesting provider on this date.  Electronically Signed: Tyson Alias, NP 11/26/2021, 2:15 PM   I spent a total of 20 minutes in face to face in clinical consultation, greater than 50% of which was counseling/coordinating care for lung cancer.

## 2021-11-26 NOTE — Progress Notes (Signed)
Patients husband:David driving patient home, spoke to him via phone and informed him of patients timeline.  Informed him we would call back this afternoon probably around 3:30 with a definite d/c time. He voiced understanding.  Patient given detailed timeline of her day here in IR.  Informed her of d/c care when sent home today(no driving/decisions for 24 hours, leave dressing on for 24 hours, no shower for 24 hours, and don't use the EMLA cream for 2 weeks-explained why, and S&S of infection)  Patient voiced understanding to all teaching.

## 2021-11-28 ENCOUNTER — Telehealth: Payer: Self-pay | Admitting: Physician Assistant

## 2021-11-28 ENCOUNTER — Inpatient Hospital Stay: Payer: BC Managed Care – PPO

## 2021-11-28 ENCOUNTER — Other Ambulatory Visit: Payer: Self-pay

## 2021-11-28 DIAGNOSIS — Z87891 Personal history of nicotine dependence: Secondary | ICD-10-CM | POA: Diagnosis not present

## 2021-11-28 DIAGNOSIS — C3491 Malignant neoplasm of unspecified part of right bronchus or lung: Secondary | ICD-10-CM | POA: Diagnosis not present

## 2021-11-28 DIAGNOSIS — R634 Abnormal weight loss: Secondary | ICD-10-CM | POA: Diagnosis not present

## 2021-11-28 DIAGNOSIS — Z923 Personal history of irradiation: Secondary | ICD-10-CM | POA: Diagnosis not present

## 2021-11-28 DIAGNOSIS — Z5111 Encounter for antineoplastic chemotherapy: Secondary | ICD-10-CM | POA: Diagnosis not present

## 2021-11-28 DIAGNOSIS — I63511 Cerebral infarction due to unspecified occlusion or stenosis of right middle cerebral artery: Secondary | ICD-10-CM | POA: Diagnosis not present

## 2021-11-28 DIAGNOSIS — Z7901 Long term (current) use of anticoagulants: Secondary | ICD-10-CM | POA: Diagnosis not present

## 2021-11-28 LAB — CBC WITH DIFFERENTIAL (CANCER CENTER ONLY)
Abs Immature Granulocytes: 0 10*3/uL (ref 0.00–0.07)
Basophils Absolute: 0 10*3/uL (ref 0.0–0.1)
Basophils Relative: 0 %
Eosinophils Absolute: 0.1 10*3/uL (ref 0.0–0.5)
Eosinophils Relative: 1 %
HCT: 29.3 % — ABNORMAL LOW (ref 36.0–46.0)
Hemoglobin: 10.1 g/dL — ABNORMAL LOW (ref 12.0–15.0)
Immature Granulocytes: 0 %
Lymphocytes Relative: 33 %
Lymphs Abs: 1.3 10*3/uL (ref 0.7–4.0)
MCH: 33.6 pg (ref 26.0–34.0)
MCHC: 34.5 g/dL (ref 30.0–36.0)
MCV: 97.3 fL (ref 80.0–100.0)
Monocytes Absolute: 1 10*3/uL (ref 0.1–1.0)
Monocytes Relative: 27 %
Neutro Abs: 1.5 10*3/uL — ABNORMAL LOW (ref 1.7–7.7)
Neutrophils Relative %: 39 %
Platelet Count: 34 10*3/uL — ABNORMAL LOW (ref 150–400)
RBC: 3.01 MIL/uL — ABNORMAL LOW (ref 3.87–5.11)
RDW: 17.8 % — ABNORMAL HIGH (ref 11.5–15.5)
Smear Review: NORMAL
WBC Count: 3.9 10*3/uL — ABNORMAL LOW (ref 4.0–10.5)
nRBC: 0 % (ref 0.0–0.2)

## 2021-11-28 LAB — CMP (CANCER CENTER ONLY)
ALT: 26 U/L (ref 0–44)
AST: 24 U/L (ref 15–41)
Albumin: 3.9 g/dL (ref 3.5–5.0)
Alkaline Phosphatase: 69 U/L (ref 38–126)
Anion gap: 4 — ABNORMAL LOW (ref 5–15)
BUN: 13 mg/dL (ref 6–20)
CO2: 28 mmol/L (ref 22–32)
Calcium: 9.6 mg/dL (ref 8.9–10.3)
Chloride: 105 mmol/L (ref 98–111)
Creatinine: 0.52 mg/dL (ref 0.44–1.00)
GFR, Estimated: >60 mL/min (ref >60)
Glucose, Bld: 77 mg/dL (ref 70–99)
Potassium: 3.9 mmol/L (ref 3.5–5.1)
Sodium: 137 mmol/L (ref 135–145)
Total Bilirubin: 0.3 mg/dL (ref 0.3–1.2)
Total Protein: 6.7 g/dL (ref 6.5–8.1)

## 2021-11-28 NOTE — Progress Notes (Signed)
[facility] OFFICE PROGRESS NOTE  [name], FNP [address] [place] [address]  DIAGNOSIS:  Stage IV (T2b, N2, M1 C) non-small cell lung cancer, adenocarcinoma presented with right hilar mass in addition to mediastinal and upper abdominal lymphadenopathy in addition to bilateral adrenal metastases and metastatic disease in the musculature of the lower back.  This was diagnosed in April 2023.   DETECTED ALTERATION(S) / BIOMARKER(S)     % CFDNA OR AMPLIFICATION        ASSOCIATED FDA-APPROVED THERAPIES         CLINICAL TRIAL AVAILABILITY [id number] 0.5% None     Yes [id number] (Splice Site Indel) 2.4% None     Yes   PD-L1 expression 0%  PRIOR THERAPY: Palliative radiotherapy to the right hilar region under the care of Dr. Lisbeth Renshaw.  CURRENT THERAPY: Systemic chemotherapy with carboplatin for AUC of 5, Alimta 500 Mg/M2 and Keytruda 200 Mg IV every 3 weeks.  First dose August 29, 2021. Status post 4 cycles.  Starting from cycle #5, the patient is going to start maintenance Alimta and Keytruda.  INTERVAL HISTORY: April Walsh 56 y.o. female returns to the clinic today for a follow-up visit accompanied by her Aunt.  The patient is feeling fairly well today without any concerning complaints.  The patient is currently undergoing chemotherapy and immunotherapy. She tolerates this well except for fatigue about 3 days after treatment.  Her last cycle of treatment was her last cycle with carboplatin.  She does have cytopenias on weekly labs without any signs or symptoms of infection or abnormal bleeding.  She is on a blood thinner for recent stroke. She establishes with her neurologist soon. She denies any fever, chills, or night sweats.  She gained 2 lbs. She was previously seen by member the nutritionist team.  She reports improvement in her shortness of breath.  She is seen by Dr. Lamonte Sakai from pulmonology.  She denies any chest pain or hemoptysis.  Denies any  nausea, vomiting, diarrhea, or constipation.  Denies any headache or visual changes.  She denies any rashes or skin changes.  She recently had a restaging CT scan performed.  She is here today for evaluation to review her scan results before starting cycle #5.     MEDICAL HISTORY: Past Medical History:  Diagnosis Date   Anemia    Asthma    Bronchitis    Cancer ([place])    basal cell melanoma, cervical cancer   COPD (chronic obstructive pulmonary disease) ([place])    "early stages of COPD"   Headache    Lung cancer ([place]) 08/13/2021    ALLERGIES:  has No Known Allergies.  MEDICATIONS:  Current Outpatient Medications  Medication Sig Dispense Refill   apixaban (ELIQUIS) 2.5 MG TABS tablet Take 1 tablet (2.5 mg total) by mouth 2 (two) times daily. 60 tablet 0   atorvastatin (LIPITOR) 40 MG tablet Take 1 tablet (40 mg total) by mouth daily. (Patient not taking: Reported on 10/04/2021) 30 tablet 0   Budeson-Glycopyrrol-Formoterol (BREZTRI AEROSPHERE) 160-9-4.8 MCG/ACT AERO Inhale 2 puffs into the lungs in the morning and at bedtime. 40.1 g 11   folic acid (FOLVITE) 1 MG tablet TAKE 1 TABLET BY MOUTH EVERY DAY 30 tablet 2   lidocaine-prilocaine (EMLA) cream Apply 1 Application topically as needed. 30 g 2   prochlorperazine (COMPAZINE) 10 MG tablet Take 1 tablet (10 mg total) by mouth every 6 (six) hours as needed. 30 tablet 2   triamcinolone  cream (KENALOG) 0.1 % Apply 1 application  topically 2 (two) times daily as needed. 80 g 0   No current facility-administered medications for this visit.    SURGICAL HISTORY:  Past Surgical History:  Procedure Laterality Date   APPENDECTOMY     1996   BREAST BIOPSY Right    2010-2015   BRONCHIAL BIOPSY  08/13/2021   Procedure: BRONCHIAL BIOPSIES;  Surgeon: Collene Gobble, MD;  Location: [facility] ENDOSCOPY;  Service: Pulmonary;;   BRONCHIAL BRUSHINGS  08/13/2021   Procedure: BRONCHIAL BRUSHINGS;  Surgeon: Collene Gobble, MD;  Location: [facility] ENDOSCOPY;  Service:  Pulmonary;;   BRONCHIAL NEEDLE ASPIRATION BIOPSY  08/13/2021   Procedure: BRONCHIAL NEEDLE ASPIRATION BIOPSIES;  Surgeon: Collene Gobble, MD;  Location: [facility] ENDOSCOPY;  Service: Pulmonary;;   CESAREAN [place] Right    2 pins and screw 1999   FOOT SURGERY Left 06/21/2021   screw and 2 pins   HEMOSTASIS CONTROL  08/13/2021   Procedure: HEMOSTASIS CONTROL;  Surgeon: Collene Gobble, MD;  Location: [facility] ENDOSCOPY;  Service: Pulmonary;;   IR IMAGING GUIDED PORT INSERTION  11/26/2021   MELANOMA EXCISION Left    basal cell melanoma 2011   [place]   VIDEO BRONCHOSCOPY  08/13/2021   Procedure: VIDEO BRONCHOSCOPY WITHOUT FLUORO;  Surgeon: Collene Gobble, MD;  Location: [facility] ENDOSCOPY;  Service: Pulmonary;;   VIDEO BRONCHOSCOPY WITH ENDOBRONCHIAL ULTRASOUND N/A 08/13/2021   Procedure: VIDEO BRONCHOSCOPY WITH ENDOBRONCHIAL ULTRASOUND;  Surgeon: Collene Gobble, MD;  Location: [facility] ENDOSCOPY;  Service: Pulmonary;  Laterality: N/A;    REVIEW OF SYSTEMS:   Review of Systems  Constitutional: Negative for appetite change, chills, fatigue, and fever HENT: Negative for mouth sores, nosebleeds, sore throat and trouble swallowing.   Eyes: Negative for eye problems and icterus.  Respiratory: Positive for improving dyspnea with activities.  Positive for improving cough.  Negative for hemoptysis and wheezing.   Cardiovascular: Negative for chest pain and leg swelling.  Gastrointestinal: Negative for abdominal pain, nausea, constipation, diarrhea, and vomiting.  Genitourinary: Negative for bladder incontinence, difficulty urinating, dysuria, frequency and hematuria.   Musculoskeletal: Negative for back pain, gait problem, neck pain and neck stiffness.  Skin: Positive for firmness and soreness and bruising over blood vessel on left forearm. Neurological: Negative for dizziness, extremity weakness, gait problem, headaches, light-headedness and  seizures.  Hematological: Negative for adenopathy. Does not bruise/bleed easily.  Psychiatric/Behavioral: Negative for confusion, depression and sleep disturbance. The patient is not nervous/anxious.     PHYSICAL EXAMINATION:  Blood pressure 101/65, pulse 89, temperature (!) 97.4 F (36.3 C), temperature source Temporal, resp. rate 18, height 5' (1.524 m), weight 101 lb 6.4 oz (46 kg), SpO2 98 %.  ECOG PERFORMANCE STATUS: 1  Physical Exam  Constitutional: Oriented to person, place, and time and positive for thin appearing female and in no acute distress.  HENT:  Head: Normocephalic and atraumatic.  Mouth/Throat: Oropharynx is clear and moist. No oropharyngeal exudate.  Eyes: Conjunctivae are normal. Right eye exhibits no discharge. Left eye exhibits no discharge. No scleral icterus.  Neck: Normal range of motion. Neck supple.  Cardiovascular: Normal rate, regular rhythm, normal heart sounds and intact distal pulses.   Pulmonary/Chest: Effort normal and breath sounds normal. No respiratory distress. No wheezes. No rales.  Abdominal: Soft. Bowel sounds are normal. Exhibits no distension and no mass. There  is no tenderness.  Musculoskeletal: Normal range of motion. Exhibits no edema.  Lymphadenopathy:    No cervical adenopathy.  Neurological: Alert and oriented to person, place, and time. Exhibits normal muscle tone. Gait normal. Coordination normal.  Skin: Not diaphoretic. No erythema. No pallor.  Psychiatric: Mood, memory and judgment normal.  Vitals reviewed.  LABORATORY DATA: Lab Results  Component Value Date   WBC 3.6 (L) 12/04/2021   HGB 9.2 (L) 12/04/2021   HCT 27.0 (L) 12/04/2021   MCV 99.3 12/04/2021   PLT 103 (L) 12/04/2021      Chemistry      Component Value Date/Time   NA 137 12/04/2021 1103   K 3.9 12/04/2021 1103   CL 107 12/04/2021 1103   CO2 25 12/04/2021 1103   BUN 16 12/04/2021 1103   CREATININE 0.68 12/04/2021 1103      Component Value Date/Time    CALCIUM 9.0 12/04/2021 1103   ALKPHOS 71 12/04/2021 1103   AST 15 12/04/2021 1103   ALT 13 12/04/2021 1103   BILITOT 0.2 (L) 12/04/2021 1103       RADIOGRAPHIC STUDIES:  CT Chest W Contrast  Result Date: 11/29/2021 CLINICAL DATA:  Primary Cancer Type: Lung Imaging Indication: Assess response to therapy Interval therapy since last imaging? Yes Initial Cancer Diagnosis Date: 08/13/2021; Established by: Biopsy-proven Detailed Pathology: Stage IV non-small cell lung cancer, adenocarcinoma. Primary Tumor location: Right hilum. Bilateral adrenal metastases and metastatic disease in the musculature of the lower back. Surgeries: Appendectomy.  Partial hysterectomy. Chemotherapy: Yes; Ongoing? Yes; Most recent administration: 11/14/2021 Immunotherapy?  Yes; Type: Keytruda; Ongoing? Yes Radiation therapy? Yes; Date Range: 08/22/2021 - 09/04/2021; Target: Right lung Other Cancer Therapies: History of cervical cancer and basal cell skin cancer. * Tracking Code: BO * EXAM: CT CHEST, ABDOMEN, AND PELVIS WITH CONTRAST TECHNIQUE: Multidetector CT imaging of the chest, abdomen and pelvis was performed following the standard protocol during bolus administration of intravenous contrast. RADIATION DOSE REDUCTION: This exam was performed according to the departmental dose-optimization program which includes automated exposure control, adjustment of the mA and/or kV according to patient size and/or use of iterative reconstruction technique. CONTRAST:  116m OMNIPAQUE IOHEXOL 300 MG/ML  SOLN COMPARISON:  Most recent CT chest 09/11/2021.  08/17/2021 PET-CT. FINDINGS: Cardiovascular: No acute vascular findings. Right IJ Port-A-Cath extends to the upper right atrium. There is stable extrinsic constriction of the right upper lobe vessels by the treated right suprahilar mass. The heart size is normal. There is no pericardial effusion. Mediastinum/Nodes: There is mild residual right perihilar and subcarinal soft tissue thickening,  but no residual discrete adenopathy. No new or enlarging mediastinal, hilar or axillary lymph nodes are identified. Mild esophageal wall thickening attributed to treatment. The thyroid gland and trachea appear unremarkable. Lungs/Pleura: No pleural effusion or pneumothorax. Moderate centrilobular and paraseptal emphysema with diffuse central airway thickening. As above, improved right perihilar soft tissue thickening with persistent extrinsic narrowing of the right upper lobe bronchus. There is improved distension of the bronchus intermedius. No residual discrete mass is identified. There is improved aeration of the right upper and middle lobes. No suspicious pulmonary nodules. Musculoskeletal/Chest wall: No chest wall mass or suspicious osseous findings. Old healed rib fractures bilaterally. CT ABDOMEN AND PELVIS FINDINGS Hepatobiliary: The liver is normal in density without suspicious focal abnormality. No evidence of gallstones, gallbladder wall thickening or biliary dilatation. Pancreas: Unremarkable. No pancreatic ductal dilatation or surrounding inflammatory changes. Spleen: Normal in size without focal abnormality. Adrenals/Urinary Tract: The previously demonstrated bilateral adrenal  masses which were hypermetabolic on PET-CT have resolved. There is mild residual thickening of the left adrenal gland limbs which demonstrate low density. The kidneys appear stable without evidence of urinary tract calculus, suspicious lesion or hydronephrosis. Stable 2.9 cm simple cyst in the lower pole of the left kidney which does not require imaging follow-up. The bladder appears normal for its degree of distention. Stomach/Bowel: Enteric contrast was administered and has passed into the rectum. The stomach appears unremarkable for its degree of distension. No evidence of bowel wall thickening, distention or surrounding inflammatory change. Suspected previous appendectomy. Vascular/Lymphatic: No residual enlarged lymph nodes  identified within the upper abdomen. No new adenopathy. Diffuse aortic and branch vessel atherosclerosis without evidence of aneurysm or large vessel occlusion. Reproductive: Hysterectomy.  No adnexal mass. Other: Intact abdominal wall.  No ascites or peritoneal nodularity. Musculoskeletal: No acute or significant osseous findings. No focal mass identified in the erector spinae musculature. Chronic degenerative disc disease at L5-S1. IMPRESSION: 1. Interval response to therapy with a resolution of the right hilar and mediastinal masses, bilateral adrenal metastases and upper abdominal lymphadenopathy. 2. There is mild residual soft tissue thickening around the right hilum with residual narrowing of the right upper lobe bronchus. The right lung aeration has improved. 3. No evidence of progressive disease. 4. Aortic Atherosclerosis (ICD10-I70.0) and Emphysema (ICD10-J43.9). Electronically Signed   By: Richardean Sale M.D.   On: 11/29/2021 11:11   CT Abdomen Pelvis W Contrast  Result Date: 11/29/2021 CLINICAL DATA:  Primary Cancer Type: Lung Imaging Indication: Assess response to therapy Interval therapy since last imaging? Yes Initial Cancer Diagnosis Date: 08/13/2021; Established by: Biopsy-proven Detailed Pathology: Stage IV non-small cell lung cancer, adenocarcinoma. Primary Tumor location: Right hilum. Bilateral adrenal metastases and metastatic disease in the musculature of the lower back. Surgeries: Appendectomy.  Partial hysterectomy. Chemotherapy: Yes; Ongoing? Yes; Most recent administration: 11/14/2021 Immunotherapy?  Yes; Type: Keytruda; Ongoing? Yes Radiation therapy? Yes; Date Range: 08/22/2021 - 09/04/2021; Target: Right lung Other Cancer Therapies: History of cervical cancer and basal cell skin cancer. * Tracking Code: BO * EXAM: CT CHEST, ABDOMEN, AND PELVIS WITH CONTRAST TECHNIQUE: Multidetector CT imaging of the chest, abdomen and pelvis was performed following the standard protocol during bolus  administration of intravenous contrast. RADIATION DOSE REDUCTION: This exam was performed according to the departmental dose-optimization program which includes automated exposure control, adjustment of the mA and/or kV according to patient size and/or use of iterative reconstruction technique. CONTRAST:  132m OMNIPAQUE IOHEXOL 300 MG/ML  SOLN COMPARISON:  Most recent CT chest 09/11/2021.  08/17/2021 PET-CT. FINDINGS: Cardiovascular: No acute vascular findings. Right IJ Port-A-Cath extends to the upper right atrium. There is stable extrinsic constriction of the right upper lobe vessels by the treated right suprahilar mass. The heart size is normal. There is no pericardial effusion. Mediastinum/Nodes: There is mild residual right perihilar and subcarinal soft tissue thickening, but no residual discrete adenopathy. No new or enlarging mediastinal, hilar or axillary lymph nodes are identified. Mild esophageal wall thickening attributed to treatment. The thyroid gland and trachea appear unremarkable. Lungs/Pleura: No pleural effusion or pneumothorax. Moderate centrilobular and paraseptal emphysema with diffuse central airway thickening. As above, improved right perihilar soft tissue thickening with persistent extrinsic narrowing of the right upper lobe bronchus. There is improved distension of the bronchus intermedius. No residual discrete mass is identified. There is improved aeration of the right upper and middle lobes. No suspicious pulmonary nodules. Musculoskeletal/Chest wall: No chest wall mass or suspicious osseous findings. Old healed  rib fractures bilaterally. CT ABDOMEN AND PELVIS FINDINGS Hepatobiliary: The liver is normal in density without suspicious focal abnormality. No evidence of gallstones, gallbladder wall thickening or biliary dilatation. Pancreas: Unremarkable. No pancreatic ductal dilatation or surrounding inflammatory changes. Spleen: Normal in size without focal abnormality. Adrenals/Urinary  Tract: The previously demonstrated bilateral adrenal masses which were hypermetabolic on PET-CT have resolved. There is mild residual thickening of the left adrenal gland limbs which demonstrate low density. The kidneys appear stable without evidence of urinary tract calculus, suspicious lesion or hydronephrosis. Stable 2.9 cm simple cyst in the lower pole of the left kidney which does not require imaging follow-up. The bladder appears normal for its degree of distention. Stomach/Bowel: Enteric contrast was administered and has passed into the rectum. The stomach appears unremarkable for its degree of distension. No evidence of bowel wall thickening, distention or surrounding inflammatory change. Suspected previous appendectomy. Vascular/Lymphatic: No residual enlarged lymph nodes identified within the upper abdomen. No new adenopathy. Diffuse aortic and branch vessel atherosclerosis without evidence of aneurysm or large vessel occlusion. Reproductive: Hysterectomy.  No adnexal mass. Other: Intact abdominal wall.  No ascites or peritoneal nodularity. Musculoskeletal: No acute or significant osseous findings. No focal mass identified in the erector spinae musculature. Chronic degenerative disc disease at L5-S1. IMPRESSION: 1. Interval response to therapy with a resolution of the right hilar and mediastinal masses, bilateral adrenal metastases and upper abdominal lymphadenopathy. 2. There is mild residual soft tissue thickening around the right hilum with residual narrowing of the right upper lobe bronchus. The right lung aeration has improved. 3. No evidence of progressive disease. 4. Aortic Atherosclerosis (ICD10-I70.0) and Emphysema (ICD10-J43.9). Electronically Signed   By: Richardean Sale M.D.   On: 11/29/2021 11:11   IR IMAGING GUIDED PORT INSERTION  Result Date: 11/26/2021 INDICATION: 56 year old female with advanced stage lung cancer presenting for Port-A-Cath placement for chemotherapy. EXAM: IMPLANTED  PORT A CATH PLACEMENT WITH ULTRASOUND AND FLUOROSCOPIC GUIDANCE COMPARISON:  None Available. MEDICATIONS: None. ANESTHESIA/SEDATION: Moderate (conscious) sedation was employed during this procedure. A total of Versed 2 mg and Fentanyl 100 mcg was administered intravenously. Moderate Sedation Time: 18 minutes. The patient's level of consciousness and vital signs were monitored continuously by radiology nursing throughout the procedure under my direct supervision. CONTRAST:  None FLUOROSCOPY TIME:  0 mGy COMPLICATIONS: None immediate. PROCEDURE: The procedure, risks, benefits, and alternatives were explained to the patient. Questions regarding the procedure were encouraged and answered. The patient understands and consents to the procedure. The right neck and chest were prepped with chlorhexidine in a sterile fashion, and a sterile drape was applied covering the operative field. Maximum barrier sterile technique with sterile gowns and gloves were used for the procedure. A timeout was performed prior to the initiation of the procedure. Ultrasound was used to examine the jugular vein which was compressible and free of internal echoes. A skin marker was used to demarcate the planned venotomy and port pocket incision sites. Local anesthesia was provided to these sites and the subcutaneous tunnel track with 1% lidocaine with 1:100,000 epinephrine. A small incision was created at the jugular access site and blunt dissection was performed of the subcutaneous tissues. Under ultrasound guidance, the jugular vein was accessed with a 21 ga micropuncture needle and an 0.018" wire was inserted to the superior vena cava. Real-time ultrasound guidance was utilized for vascular access including the acquisition of a permanent ultrasound image documenting patency of the accessed vessel. A 5 Fr micopuncture set was then used, through which a  0.035" Rosen wire was passed under fluoroscopic guidance into the inferior vena cava. An 8 Fr  dilator was then placed over the wire. A subcutaneous port pocket was then created along the upper chest wall utilizing a combination of sharp and blunt dissection. The pocket was irrigated with sterile saline, packed with gauze, and observed for hemorrhage. A single lumen "ISP" sized power injectable port was chosen for placement. The 8 Fr catheter was tunneled from the port pocket site to the venotomy incision. The port was placed in the pocket. The external catheter was trimmed to appropriate length. The dilator was exchanged for an 8 Fr peel-away sheath under fluoroscopic guidance. The catheter was then placed through the sheath and the sheath was removed. Final catheter positioning was confirmed and documented with a fluoroscopic spot radiograph. The port was accessed with a Huber needle, aspirated, and flushed with heparinized saline. The deep dermal layer of the port pocket incision was closed with interrupted 3-0 Vicryl suture. Dermabond was then placed over the port pocket and neck incisions. The patient tolerated the procedure well without immediate post procedural complication. FINDINGS: After catheter placement, the tip lies within the superior cavoatrial junction. The catheter aspirates and flushes normally and is ready for immediate use. IMPRESSION: Successful placement of a power injectable Port-A-Cath via the right internal jugular vein. The catheter is ready for immediate use. Ruthann Cancer, MD [facility] Electronically Signed   By: Ruthann Cancer M.D.   On: 11/26/2021 16:14     ASSESSMENT/PLAN:  This is a very pleasant 56 years old white female with Stage IV (T2b, N2, M1 C) non-small cell lung cancer, adenocarcinoma presented with right hilar mass in addition to mediastinal and upper abdominal lymphadenopathy in addition to bilateral adrenal metastases and metastatic disease in the musculature of the lower back.  This was diagnosed in  April 2023.   The molecular studies by [employer] showed no actionable mutations and the patient has negative PD-L1 expression. She is currently undergoing palliative radiotherapy to the right hilar region under the care of Dr. Lisbeth Renshaw.   She completed palliative radiation to the right hilar area under the care of Dr. Lisbeth Renshaw.  She is currently undergoing palliative systemic chemotherapy with carboplatin for an AUC of 5, Alimta 500 mg per metered squared, Keytruda 200 mg IV every 3 weeks.  She is status post 4 cycles.  Her first dose was on 08/29/2021.   The patient recently had a restaging CT scan performed.  Dr. Julien Nordmann personally and independently reviewed the scan and discussed results with patient today.  The scan showed positive response to treatment.  Dr. Julien Nordmann recommends the patient continue on the same treatment at the same dose.  She is expected to start cycle #5 today.  Starting from cycle #5, the patient is can start maintenance Alimta and Keytruda.  We will see the patient back for follow-up visit in 3 weeks for evaluation and repeat blood work before starting cycle #6.  Her ANC is 1.3. Reviewed with Dr. Julien Nordmann. She is ok to proceed with cycle #5 today as scheduled with her current labs.   She recently had a port placed. We will ensure that her labs moving forward are labs/flush appointments.   The patient was advised to call immediately if she has any concerning symptoms in the interval. The patient voices understanding of current disease status and treatment options and is in agreement with the current care plan. All questions were answered. The patient  knows to call the clinic with any problems, questions or concerns. We can certainly see the patient much sooner if necessary  No orders of the defined types were placed in this encounter.    Cassandra L Heilingoetter, PA-C 12/04/21  ADDENDUM: Hematology/Oncology Attending: I had a face-to-face encounter with the patient  today.  I reviewed her records, lab, scan and recommended her care plan.  This is a very pleasant 56 years old white female with a stage IV non-small cell lung cancer, adenocarcinoma diagnosed in April 2023 with no actionable mutation and negative PD-L1 expression.  She is status post palliative radiation to the right hilar region under the care of Dr. Lisbeth Renshaw.  She is currently undergoing systemic chemotherapy with carboplatin, Alimta and Keytruda status post 4 cycles.  The patient has been tolerating this treatment well with no concerning adverse effect except for mild fatigue. She had repeat CT scan of the chest, abdomen and pelvis performed recently.  I personally and independently reviewed the scan and discussed the result with the patient today. Her scan showed improvement of her disease. I recommended for her to continue her current treatment with maintenance Alimta and Keytruda starting from cycle #5. She will come back for follow-up visit in 3 weeks for evaluation before starting cycle #6. The patient was advised to call immediately if she has any other concerning symptoms in the interval. The total time spent in the appointment was 30 minutes. Disclaimer: This note was dictated with voice recognition software. Similar sounding words can inadvertently be transcribed and may be missed upon review. Eilleen Kempf, MD

## 2021-11-28 NOTE — Telephone Encounter (Signed)
I called her to review platelet precautions. Dr. Julien Nordmann and I discussed her eliquis. Given her recent stroke, we would recommend she continue her eliquis as prescribed. Advised for significant bleeding to seek emergency evaluation. For any mild bleeding, would recommend making a same day lab visit to recheck her platelet count. She denies bleeding at this time.

## 2021-11-29 ENCOUNTER — Ambulatory Visit (HOSPITAL_COMMUNITY)
Admission: RE | Admit: 2021-11-29 | Discharge: 2021-11-29 | Disposition: A | Discharge status: Home / Self Care | Payer: BC Managed Care – PPO | Source: Ambulatory Visit | Attending: Physician Assistant | Admitting: Physician Assistant

## 2021-11-29 ENCOUNTER — Encounter (HOSPITAL_COMMUNITY): Payer: Self-pay

## 2021-11-29 DIAGNOSIS — C3491 Malignant neoplasm of unspecified part of right bronchus or lung: Secondary | ICD-10-CM | POA: Insufficient documentation

## 2021-11-29 DIAGNOSIS — N281 Cyst of kidney, acquired: Secondary | ICD-10-CM | POA: Diagnosis not present

## 2021-11-29 DIAGNOSIS — C7971 Secondary malignant neoplasm of right adrenal gland: Secondary | ICD-10-CM | POA: Diagnosis not present

## 2021-11-29 DIAGNOSIS — J439 Emphysema, unspecified: Secondary | ICD-10-CM | POA: Diagnosis not present

## 2021-11-29 DIAGNOSIS — C3490 Malignant neoplasm of unspecified part of unspecified bronchus or lung: Secondary | ICD-10-CM | POA: Diagnosis not present

## 2021-11-29 MED ORDER — SODIUM CHLORIDE (PF) 0.9 % IJ SOLN
INTRAMUSCULAR | Status: AC
  Filled 2021-11-29: qty 50

## 2021-11-29 MED ORDER — IOHEXOL 300 MG/ML  SOLN
100.0000 mL | Freq: Once | INTRAMUSCULAR | Status: AC | PRN
  Administered 2021-11-29: 100 mL via INTRAVENOUS

## 2021-12-03 ENCOUNTER — Other Ambulatory Visit: Payer: Self-pay | Admitting: Medical Oncology

## 2021-12-03 ENCOUNTER — Other Ambulatory Visit: Payer: Self-pay

## 2021-12-03 DIAGNOSIS — C3491 Malignant neoplasm of unspecified part of right bronchus or lung: Secondary | ICD-10-CM

## 2021-12-04 ENCOUNTER — Inpatient Hospital Stay: Payer: BC Managed Care – PPO

## 2021-12-04 ENCOUNTER — Inpatient Hospital Stay (HOSPITAL_BASED_OUTPATIENT_CLINIC_OR_DEPARTMENT_OTHER): Payer: BC Managed Care – PPO | Admitting: Physician Assistant

## 2021-12-04 ENCOUNTER — Ambulatory Visit: Payer: BC Managed Care – PPO

## 2021-12-04 ENCOUNTER — Other Ambulatory Visit: Payer: Self-pay

## 2021-12-04 VITALS — BP 102/77 | HR 71 | Resp 18

## 2021-12-04 VITALS — BP 101/65 | HR 89 | Temp 97.4°F | Resp 18 | Ht 60.0 in | Wt 101.4 lb

## 2021-12-04 DIAGNOSIS — Z87891 Personal history of nicotine dependence: Secondary | ICD-10-CM | POA: Diagnosis not present

## 2021-12-04 DIAGNOSIS — C3491 Malignant neoplasm of unspecified part of right bronchus or lung: Secondary | ICD-10-CM

## 2021-12-04 DIAGNOSIS — Z5111 Encounter for antineoplastic chemotherapy: Secondary | ICD-10-CM

## 2021-12-04 DIAGNOSIS — Z5112 Encounter for antineoplastic immunotherapy: Secondary | ICD-10-CM

## 2021-12-04 DIAGNOSIS — Z7901 Long term (current) use of anticoagulants: Secondary | ICD-10-CM | POA: Diagnosis not present

## 2021-12-04 DIAGNOSIS — I63511 Cerebral infarction due to unspecified occlusion or stenosis of right middle cerebral artery: Secondary | ICD-10-CM | POA: Diagnosis not present

## 2021-12-04 DIAGNOSIS — Z923 Personal history of irradiation: Secondary | ICD-10-CM | POA: Diagnosis not present

## 2021-12-04 DIAGNOSIS — R634 Abnormal weight loss: Secondary | ICD-10-CM | POA: Diagnosis not present

## 2021-12-04 LAB — CBC WITH DIFFERENTIAL (CANCER CENTER ONLY)
Abs Immature Granulocytes: 0.03 10*3/uL (ref 0.00–0.07)
Basophils Absolute: 0 10*3/uL (ref 0.0–0.1)
Basophils Relative: 1 %
Eosinophils Absolute: 0.1 10*3/uL (ref 0.0–0.5)
Eosinophils Relative: 1 %
HCT: 27 % — ABNORMAL LOW (ref 36.0–46.0)
Hemoglobin: 9.2 g/dL — ABNORMAL LOW (ref 12.0–15.0)
Immature Granulocytes: 1 %
Lymphocytes Relative: 34 %
Lymphs Abs: 1.2 10*3/uL (ref 0.7–4.0)
MCH: 33.8 pg (ref 26.0–34.0)
MCHC: 34.1 g/dL (ref 30.0–36.0)
MCV: 99.3 fL (ref 80.0–100.0)
Monocytes Absolute: 0.9 10*3/uL (ref 0.1–1.0)
Monocytes Relative: 26 %
Neutro Abs: 1.3 10*3/uL — ABNORMAL LOW (ref 1.7–7.7)
Neutrophils Relative %: 37 %
Platelet Count: 103 10*3/uL — ABNORMAL LOW (ref 150–400)
RBC: 2.72 MIL/uL — ABNORMAL LOW (ref 3.87–5.11)
RDW: 20.1 % — ABNORMAL HIGH (ref 11.5–15.5)
Smear Review: NORMAL
WBC Count: 3.6 10*3/uL — ABNORMAL LOW (ref 4.0–10.5)
nRBC: 0 % (ref 0.0–0.2)

## 2021-12-04 LAB — CMP (CANCER CENTER ONLY)
ALT: 13 U/L (ref 0–44)
AST: 15 U/L (ref 15–41)
Albumin: 3.8 g/dL (ref 3.5–5.0)
Alkaline Phosphatase: 71 U/L (ref 38–126)
Anion gap: 5 (ref 5–15)
BUN: 16 mg/dL (ref 6–20)
CO2: 25 mmol/L (ref 22–32)
Calcium: 9 mg/dL (ref 8.9–10.3)
Chloride: 107 mmol/L (ref 98–111)
Creatinine: 0.68 mg/dL (ref 0.44–1.00)
GFR, Estimated: >60 mL/min (ref >60)
Glucose, Bld: 92 mg/dL (ref 70–99)
Potassium: 3.9 mmol/L (ref 3.5–5.1)
Sodium: 137 mmol/L (ref 135–145)
Total Bilirubin: 0.2 mg/dL — ABNORMAL LOW (ref 0.3–1.2)
Total Protein: 6.3 g/dL — ABNORMAL LOW (ref 6.5–8.1)

## 2021-12-04 LAB — TSH: TSH: 1.192 u[IU]/mL (ref 0.350–4.500)

## 2021-12-04 MED ORDER — HEPARIN SOD (PORK) LOCK FLUSH 100 UNIT/ML IV SOLN
500.0000 [IU] | Freq: Once | INTRAVENOUS | Status: AC | PRN
  Administered 2021-12-04: 500 [IU]

## 2021-12-04 MED ORDER — SODIUM CHLORIDE 0.9 % IV SOLN
500.0000 mg/m2 | Freq: Once | INTRAVENOUS | Status: AC
  Administered 2021-12-04: 700 mg via INTRAVENOUS
  Filled 2021-12-04: qty 20

## 2021-12-04 MED ORDER — PROCHLORPERAZINE MALEATE 10 MG PO TABS
10.0000 mg | ORAL_TABLET | Freq: Once | ORAL | Status: AC
  Administered 2021-12-04: 10 mg via ORAL
  Filled 2021-12-04: qty 1

## 2021-12-04 MED ORDER — SODIUM CHLORIDE 0.9 % IV SOLN
200.0000 mg | Freq: Once | INTRAVENOUS | Status: AC
  Administered 2021-12-04: 200 mg via INTRAVENOUS
  Filled 2021-12-04: qty 200

## 2021-12-04 MED ORDER — SODIUM CHLORIDE 0.9% FLUSH
10.0000 mL | INTRAVENOUS | Status: DC | PRN
  Administered 2021-12-04: 10 mL

## 2021-12-04 MED ORDER — SODIUM CHLORIDE 0.9 % IV SOLN: Freq: Once | INTRAVENOUS | Status: AC

## 2021-12-04 NOTE — Progress Notes (Signed)
Per Cassie PA-C, OK to proceed w/ tx today w/ ANC 1.3 K/uL

## 2021-12-04 NOTE — Patient Instructions (Signed)
[facility]  Discharge Instructions: Thank you for choosing [facility] to provide your oncology and hematology care.   If you have a lab appointment with the [facility], please go directly to the [facility] and check in at the registration area.   Wear comfortable clothing and clothing appropriate for easy access to any Portacath or PICC line.   We strive to give you quality time with your provider. You may need to reschedule your appointment if you arrive late (15 or more minutes).  Arriving late affects you and other patients whose appointments are after yours.  Also, if you miss three or more appointments without notifying the office, you may be dismissed from the clinic at the provider's discretion.      For prescription refill requests, have your pharmacy contact our office and allow 72 hours for refills to be completed.    Today you received the following chemotherapy and/or immunotherapy agents: Keytruda/Alimta      To help prevent nausea and vomiting after your treatment, we encourage you to take your nausea medication as directed.  BELOW ARE SYMPTOMS THAT SHOULD BE REPORTED IMMEDIATELY: *FEVER GREATER THAN 100.4 F (38 C) OR HIGHER *CHILLS OR SWEATING *NAUSEA AND VOMITING THAT IS NOT CONTROLLED WITH YOUR NAUSEA MEDICATION *UNUSUAL SHORTNESS OF BREATH *UNUSUAL BRUISING OR BLEEDING *URINARY PROBLEMS (pain or burning when urinating, or frequent urination) *BOWEL PROBLEMS (unusual diarrhea, constipation, pain near the anus) TENDERNESS IN MOUTH AND THROAT WITH OR WITHOUT PRESENCE OF ULCERS (sore throat, sores in mouth, or a toothache) UNUSUAL RASH, SWELLING OR PAIN  UNUSUAL VAGINAL DISCHARGE OR ITCHING   Items with * indicate a potential emergency and should be followed up as soon as possible or go to the Emergency Department if any problems should occur.  Please show the CHEMOTHERAPY ALERT CARD or IMMUNOTHERAPY ALERT CARD at  check-in to the Emergency Department and triage nurse.  Should you have questions after your visit or need to cancel or reschedule your appointment, please contact [facility]  Dept: [phone]  and follow the prompts.  Office hours are 8:00 a.m. to 4:30 p.m. Monday - Friday. Please note that voicemails left after 4:00 p.m. may not be returned until the following business day.  We are closed weekends and major holidays. You have access to a nurse at all times for urgent questions. Please call the main number to the clinic Dept: [phone] and follow the prompts.   For any non-urgent questions, you may also contact your provider using MyChart. We now offer e-Visits for anyone 63 and older to request care online for non-urgent symptoms. For details visit [email or web address].   Also download the MyChart app! Go to the app store, search "MyChart", open the app, select [facility], and log in with your MyChart username and password.  Masks are optional in the cancer centers. If you would like for your care team to wear a mask while they are taking care of you, please let them know. For doctor visits, patients may have with them one support person who is at least 56 years old. At this time, visitors are not allowed in the infusion area.

## 2021-12-06 ENCOUNTER — Encounter: Payer: Self-pay | Admitting: Neurology

## 2021-12-06 ENCOUNTER — Other Ambulatory Visit: Payer: Self-pay

## 2021-12-06 ENCOUNTER — Ambulatory Visit (INDEPENDENT_AMBULATORY_CARE_PROVIDER_SITE_OTHER): Payer: BC Managed Care – PPO | Admitting: Neurology

## 2021-12-06 VITALS — BP 99/68 | HR 88 | Ht 60.0 in | Wt 98.8 lb

## 2021-12-06 DIAGNOSIS — R29898 Other symptoms and signs involving the musculoskeletal system: Secondary | ICD-10-CM

## 2021-12-06 DIAGNOSIS — C3491 Malignant neoplasm of unspecified part of right bronchus or lung: Secondary | ICD-10-CM

## 2021-12-06 DIAGNOSIS — I63411 Cerebral infarction due to embolism of right middle cerebral artery: Secondary | ICD-10-CM

## 2021-12-06 NOTE — Progress Notes (Signed)
[facility] [address] [place]. [place] [address] [phone]       OFFICE CONSULT NOTE  Ms. April Walsh Date of Birth:  1966/01/06 Medical Record Number:  [id number]   Referring MD: Zeb Comfort Reason for Referral: Stroke   HPI: April Walsh is a 56 year old pleasant Caucasian lady seen today for initial office consultation visit for stroke.  She is accompanied by her aunt today.  History is obtained from them and review of electronic medical records and I have personally reviewed pertinent available imaging films in PACS.  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  She woke up on 09/11/2021 and noticed weakness in the left upper extremity mostly as well as some slurred speech and facial droop.  She presented outside time window for thrombolysis.  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  MRI scan of the brain showed small patchy acute right MCA territory infarcts involving the insular as well as posterior frontal and parietal region.  There was also a tiny punctate acute left frontal lobe infarct in addition.  2D echo showed ejection fraction of 60 to 65% without cardiac source of embolism.  Urine drug screen was positive for cannabis.  LDL cholesterol was 87 mg percent and hemoglobin A1c was 4.7.  The etiology of patient's stroke was felt to be hypercoagulability from her metastatic adenocarcinoma and she was started on Eliquis.  She denies any prior history of deep vein thrombosis, pulmonary embolism.  She had no prior history of strokes, TIAs, seizures, migraines or other  neurological problems.  She is followed by Dr. Julien Nordmann at the oncology center and has just finished 2-week course of radiation and is currently on chemotherapy.  Patient states her left hand weakness appears to have improved she still has minimum diminished fine motor skills and some numbness in the hand but otherwise is almost back to baseline.  Her slurred speech and facial droop has recovered completely.  She is tolerating Eliquis well without bleeding or bruising.  She is also tolerating Lipitor well without muscle aches and pains but has not had any follow-up lipid profile checked yet.  She has no complaints today.  ROS:   14 system review of systems is positive for weakness, numbness, slurred speech all other systems negative  PMH:  Past Medical History:  Diagnosis Date   Anemia    Asthma    Bronchitis    Cancer ([facility])    basal cell melanoma, cervical cancer   COPD (chronic obstructive pulmonary disease) ([facility])    "early stages of COPD"   Headache    Lung cancer ([facility]) 08/13/2021    Social History:  Social History   Socioeconomic History   Marital status: Married    Spouse name: Not on file   Number of children: Not on file   Years of education: Not on file   Highest education level: Not on file  Occupational History   Not on file  Tobacco Use   Smoking status: Former    Packs/day: 0.50    Years: 32.00    Total pack years: 16.00    Types: Cigarettes    Passive  exposure: Past   Smokeless tobacco: Never   Tobacco comments:    1/2 pack smoked daily ARJ, RN 08/02/21  Vaping Use   Vaping Use: Never used  Substance and Sexual Activity   Alcohol use: Never   Drug use: Never   Sexual activity: Not on file  Other Topics Concern   Not on file  Social History Narrative   Not on file   Social Determinants of Health   Financial Resource Strain: Not on file  Food Insecurity: Not on file  Transportation Needs: Not on file  Physical Activity: Not on file  Stress: Not on  file  Social Connections: Not on file  Intimate Partner Violence: Not on file    Medications:   Current Outpatient Medications on File Prior to Visit  Medication Sig Dispense Refill   apixaban (ELIQUIS) 2.5 MG TABS tablet Take 1 tablet (2.5 mg total) by mouth 2 (two) times daily. 60 tablet 0   Budeson-Glycopyrrol-Formoterol (BREZTRI AEROSPHERE) 160-9-4.8 MCG/ACT AERO Inhale 2 puffs into the lungs in the morning and at bedtime. 74.0 g 11   folic acid (FOLVITE) 1 MG tablet TAKE 1 TABLET BY MOUTH EVERY DAY 30 tablet 2   lidocaine-prilocaine (EMLA) cream Apply 1 Application topically as needed. 30 g 2   prochlorperazine (COMPAZINE) 10 MG tablet Take 1 tablet (10 mg total) by mouth every 6 (six) hours as needed. 30 tablet 2   triamcinolone cream (KENALOG) 0.1 % Apply 1 application  topically 2 (two) times daily as needed. 80 g 0   atorvastatin (LIPITOR) 40 MG tablet Take 1 tablet (40 mg total) by mouth daily. (Patient not taking: Reported on 10/04/2021) 30 tablet 0   No current facility-administered medications on file prior to visit.    Allergies:  No Known Allergies  Physical Exam General: Frail cachectic malnourished looking middle-aged Caucasian lady, seated, in no evident distress Head: head normocephalic and atraumatic.   Neck: supple with no carotid or supraclavicular bruits Cardiovascular: regular rate and rhythm, no murmurs Musculoskeletal: no deformity Skin:  no rash/petichiae Vascular:  Normal pulses all extremities  Neurologic Exam Mental Status: Awake and fully alert. Oriented to place and time. Recent and remote memory intact. Attention span, concentration and fund of knowledge appropriate. Mood and affect appropriate.  Cranial Nerves: Fundoscopic exam reveals sharp disc margins. Pupils equal, briskly reactive to light. Extraocular movements full without nystagmus. Visual fields full to confrontation. Hearing intact. Facial sensation intact. Face, tongue, palate moves normally  and symmetrically.  Motor: Normal bulk and tone. Normal strength in all tested extremity muscles.  Diminished fine finger movements on the left.  Orbits right over left upper extremity. Sensory.: intact to touch , pinprick , position and vibratory sensation.  Coordination: Rapid alternating movements normal in all extremities. Finger-to-nose and heel-to-shin performed accurately bilaterally. Gait and Station: Arises from chair without difficulty. Stance is normal. Gait demonstrates normal stride length and balance . Able to heel, toe and tandem walk without difficulty.  Reflexes: 1+ and symmetric. Toes downgoing.   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  Patient is doing well with very minimal residual deficits.  We will not do prolonged cardiac monitoring for paroxysmal A-fib as it`s finding is not going to change treatment plan.     PLAN: I had a long d/w patient and her aunt about her recent stroke, risk  for recurrent stroke/TIAs, personally independently reviewed imaging studies and stroke evaluation results and answered questions.Continue Eliquis (apixaban) 2.5 mg twice daily  for presumed hypercoagulability from her metastatic adenocarcinoma secondary stroke prevention and maintain strict control of hypertension with blood pressure goal below 130/90, diabetes with hemoglobin A1c goal below 6.5% and lipids with LDL cholesterol goal below 70 mg/dL. I also advised the patient to eat a healthy diet with plenty of whole grains, cereals, fruits and vegetables, exercise regularly and maintain ideal body weight.  We will not check prolonged cardiac monitoring for paroxysmal A-fib as it is finding his not going to change treatment decision as she is already on long-term anticoagulation.  Check follow-up lipid profile.  Followup in the  future with me in 3 months or call earlier if necessary.  Greater than 50% time during this 45-minute consultation visit was spent on counseling and coordination of care about her stroke and discussion about hypercoagulability from cancer and treatment and answering questions. April Contras, MD  Note: This document was prepared with digital dictation and possible smart phrase technology. Any transcriptional errors that result from this process are unintentional.

## 2021-12-06 NOTE — Patient Instructions (Signed)
I had a long d/w patient and her aunt about her recent stroke, risk for recurrent stroke/TIAs, personally independently reviewed imaging studies and stroke evaluation results and answered questions.Continue Eliquis (apixaban) 2.5 mg twice daily  for presumed hypercoagulability from her metastatic adenocarcinoma secondary stroke prevention and maintain strict control of hypertension with blood pressure goal below 130/90, diabetes with hemoglobin A1c goal below 6.5% and lipids with LDL cholesterol goal below 70 mg/dL. I also advised the patient to eat a healthy diet with plenty of whole grains, cereals, fruits and vegetables, exercise regularly and maintain ideal body weight.  We will not check prolonged cardiac monitoring for paroxysmal A-fib as it is finding his not going to change treatment decision as she is already on long-term anticoagulation.  Check follow-up lipid profile.  Followup in the future with me in 3 months or call earlier if necessary. Stroke Prevention Some medical conditions and behaviors can lead to a higher chance of having a stroke. You can help prevent a stroke by eating healthy, exercising, not smoking, and managing any medical conditions you have. Stroke is a leading cause of functional impairment. Primary prevention is particularly important because a majority of strokes are first-time events. Stroke changes the lives of not only those who experience a stroke but also their family and other caregivers. How can this condition affect me? A stroke is a medical emergency and should be treated right away. A stroke can lead to brain damage and can sometimes be life-threatening. If a person gets medical treatment right away, there is a better chance of surviving and recovering from a stroke. What can increase my risk? The following medical conditions may increase your risk of a stroke: Cardiovascular disease. High blood pressure (hypertension). Diabetes. High cholesterol. Sickle cell  disease. Blood clotting disorders (hypercoagulable state). Obesity. Sleep disorders (obstructive sleep apnea). Other risk factors include: Being older than age 53. Having a history of blood clots, stroke, or mini-stroke (transient ischemic attack, TIA). Genetic factors, such as race, ethnicity, or a family history of stroke. Smoking cigarettes or using other tobacco products. Taking birth control pills, especially if you also use tobacco. Heavy use of alcohol or drugs, especially cocaine and methamphetamine. Physical inactivity. What actions can I take to prevent this? Manage your health conditions High cholesterol levels. Eating a healthy diet is important for preventing high cholesterol. If cholesterol cannot be managed through diet alone, you may need to take medicines. Take any prescribed medicines to control your cholesterol as told by your health care provider. Hypertension. To reduce your risk of stroke, try to keep your blood pressure below 130/80. Eating a healthy diet and exercising regularly are important for controlling blood pressure. If these steps are not enough to manage your blood pressure, you may need to take medicines. Take any prescribed medicines to control hypertension as told by your health care provider. Ask your health care provider if you should monitor your blood pressure at home. Have your blood pressure checked every year, even if your blood pressure is normal. Blood pressure increases with age and some medical conditions. Diabetes. Eating a healthy diet and exercising regularly are important parts of managing your blood sugar (glucose). If your blood sugar cannot be managed through diet and exercise, you may need to take medicines. Take any prescribed medicines to control your diabetes as told by your health care provider. Get evaluated for obstructive sleep apnea. Talk to your health care provider about getting a sleep evaluation if you snore a lot or  have  excessive sleepiness. Make sure that any other medical conditions you have, such as atrial fibrillation or atherosclerosis, are managed. Nutrition Follow instructions from your health care provider about what to eat or drink to help manage your health condition. These instructions may include: Reducing your daily calorie intake. Limiting how much salt (sodium) you use to 1,500 milligrams (mg) each day. Using only healthy fats for cooking, such as olive oil, canola oil, or sunflower oil. Eating healthy foods. You can do this by: Choosing foods that are high in fiber, such as whole grains, and fresh fruits and vegetables. Eating at least 5 servings of fruits and vegetables a day. Try to fill one-half of your plate with fruits and vegetables at each meal. Choosing lean protein foods, such as lean cuts of meat, poultry without skin, fish, tofu, beans, and nuts. Eating low-fat dairy products. Avoiding foods that are high in sodium. This can help lower blood pressure. Avoiding foods that have saturated fat, trans fat, and cholesterol. This can help prevent high cholesterol. Avoiding processed and prepared foods. Counting your daily carbohydrate intake.  Lifestyle If you drink alcohol: Limit how much you have to: 0-1 drink a day for women who are not pregnant. 0-2 drinks a day for men. Know how much alcohol is in your drink. In the [place] one drink equals one 12 oz bottle of beer (343mL), one 5 oz glass of wine (13mL), or one 1 oz glass of hard liquor (39mL). Do not use any products that contain nicotine or tobacco. These products include cigarettes, chewing tobacco, and vaping devices, such as e-cigarettes. If you need help quitting, ask your health care provider. Avoid secondhand smoke. Do not use drugs. Activity  Try to stay at a healthy weight. Get at least 30 minutes of exercise on most days, such as: Fast walking. Biking. Swimming. Medicines Take over-the-counter and prescription  medicines only as told by your health care provider. Aspirin or blood thinners (antiplatelets or anticoagulants) may be recommended to reduce your risk of forming blood clots that can lead to stroke. Avoid taking birth control pills. Talk to your health care provider about the risks of taking birth control pills if: You are over 82 years old. You smoke. You get very bad headaches. You have had a blood clot. Where to find more information American Stroke Association: www.strokeassociation.org Get help right away if: You or a loved one has any symptoms of a stroke. "BE FAST" is an easy way to remember the main warning signs of a stroke: B - Balance. Signs are dizziness, sudden trouble walking, or loss of balance. E - Eyes. Signs are trouble seeing or a sudden change in vision. F - Face. Signs are sudden weakness or numbness of the face, or the face or eyelid drooping on one side. A - Arms. Signs are weakness or numbness in an arm. This happens suddenly and usually on one side of the body. S - Speech. Signs are sudden trouble speaking, slurred speech, or trouble understanding what people say. T - Time. Time to call emergency services. Write down what time symptoms started. You or a loved one has other signs of a stroke, such as: A sudden, severe headache with no known cause. Nausea or vomiting. Seizure. These symptoms may represent a serious problem that is an emergency. Do not wait to see if the symptoms will go away. Get medical help right away. Call your local emergency services (911 in the [place] Do not drive yourself to the  hospital. Summary You can help to prevent a stroke by eating healthy, exercising, not smoking, limiting alcohol intake, and managing any medical conditions you may have. Do not use any products that contain nicotine or tobacco. These include cigarettes, chewing tobacco, and vaping devices, such as e-cigarettes. If you need help quitting, ask your health care  provider. Remember "BE FAST" for warning signs of a stroke. Get help right away if you or a loved one has any of these signs. This information is not intended to replace advice given to you by your health care provider. Make sure you discuss any questions you have with your health care provider. Document Revised: 11/29/2019 Document Reviewed: 11/29/2019 Elsevier Patient Education  [place].

## 2021-12-07 ENCOUNTER — Other Ambulatory Visit: Payer: Self-pay

## 2021-12-11 ENCOUNTER — Inpatient Hospital Stay: Payer: BC Managed Care – PPO

## 2021-12-12 ENCOUNTER — Inpatient Hospital Stay: Payer: BC Managed Care – PPO

## 2021-12-13 ENCOUNTER — Telehealth: Payer: Self-pay | Admitting: Internal Medicine

## 2021-12-13 NOTE — Telephone Encounter (Signed)
Scheduled per 07/25 los, patient has been called and notified of upcoming appointments.

## 2021-12-18 ENCOUNTER — Other Ambulatory Visit: Payer: BC Managed Care – PPO

## 2021-12-18 DIAGNOSIS — L03031 Cellulitis of right toe: Secondary | ICD-10-CM | POA: Diagnosis not present

## 2021-12-19 ENCOUNTER — Other Ambulatory Visit: Payer: BC Managed Care – PPO

## 2021-12-25 ENCOUNTER — Inpatient Hospital Stay: Payer: BC Managed Care – PPO | Attending: Radiation Oncology | Admitting: Internal Medicine

## 2021-12-25 ENCOUNTER — Encounter: Payer: Self-pay | Admitting: Internal Medicine

## 2021-12-25 ENCOUNTER — Other Ambulatory Visit: Payer: Self-pay

## 2021-12-25 ENCOUNTER — Inpatient Hospital Stay: Payer: BC Managed Care – PPO

## 2021-12-25 VITALS — BP 108/70 | HR 89 | Temp 98.4°F | Resp 16 | Ht 60.0 in | Wt 101.7 lb

## 2021-12-25 DIAGNOSIS — C3491 Malignant neoplasm of unspecified part of right bronchus or lung: Secondary | ICD-10-CM

## 2021-12-25 DIAGNOSIS — Z923 Personal history of irradiation: Secondary | ICD-10-CM | POA: Diagnosis not present

## 2021-12-25 DIAGNOSIS — I63511 Cerebral infarction due to unspecified occlusion or stenosis of right middle cerebral artery: Secondary | ICD-10-CM | POA: Insufficient documentation

## 2021-12-25 DIAGNOSIS — Z5112 Encounter for antineoplastic immunotherapy: Secondary | ICD-10-CM | POA: Insufficient documentation

## 2021-12-25 DIAGNOSIS — Z87891 Personal history of nicotine dependence: Secondary | ICD-10-CM | POA: Insufficient documentation

## 2021-12-25 DIAGNOSIS — Z7901 Long term (current) use of anticoagulants: Secondary | ICD-10-CM | POA: Diagnosis not present

## 2021-12-25 DIAGNOSIS — Z5111 Encounter for antineoplastic chemotherapy: Secondary | ICD-10-CM | POA: Insufficient documentation

## 2021-12-25 LAB — CMP (CANCER CENTER ONLY)
ALT: 17 U/L (ref 0–44)
AST: 21 U/L (ref 15–41)
Albumin: 3.7 g/dL (ref 3.5–5.0)
Alkaline Phosphatase: 70 U/L (ref 38–126)
Anion gap: 5 (ref 5–15)
BUN: 15 mg/dL (ref 6–20)
CO2: 24 mmol/L (ref 22–32)
Calcium: 9.3 mg/dL (ref 8.9–10.3)
Chloride: 108 mmol/L (ref 98–111)
Creatinine: 0.69 mg/dL (ref 0.44–1.00)
GFR, Estimated: >60 mL/min (ref >60)
Glucose, Bld: 87 mg/dL (ref 70–99)
Potassium: 4 mmol/L (ref 3.5–5.1)
Sodium: 137 mmol/L (ref 135–145)
Total Bilirubin: 0.3 mg/dL (ref 0.3–1.2)
Total Protein: 6.8 g/dL (ref 6.5–8.1)

## 2021-12-25 LAB — CBC WITH DIFFERENTIAL (CANCER CENTER ONLY)
Abs Immature Granulocytes: 0.03 10*3/uL (ref 0.00–0.07)
Basophils Absolute: 0.1 10*3/uL (ref 0.0–0.1)
Basophils Relative: 1 %
Eosinophils Absolute: 0.1 10*3/uL (ref 0.0–0.5)
Eosinophils Relative: 3 %
HCT: 29.2 % — ABNORMAL LOW (ref 36.0–46.0)
Hemoglobin: 10 g/dL — ABNORMAL LOW (ref 12.0–15.0)
Immature Granulocytes: 1 %
Lymphocytes Relative: 26 %
Lymphs Abs: 1.5 10*3/uL (ref 0.7–4.0)
MCH: 35.2 pg — ABNORMAL HIGH (ref 26.0–34.0)
MCHC: 34.2 g/dL (ref 30.0–36.0)
MCV: 102.8 fL — ABNORMAL HIGH (ref 80.0–100.0)
Monocytes Absolute: 1 10*3/uL (ref 0.1–1.0)
Monocytes Relative: 18 %
Neutro Abs: 2.9 10*3/uL (ref 1.7–7.7)
Neutrophils Relative %: 51 %
Platelet Count: 212 10*3/uL (ref 150–400)
RBC: 2.84 MIL/uL — ABNORMAL LOW (ref 3.87–5.11)
RDW: 18.9 % — ABNORMAL HIGH (ref 11.5–15.5)
Smear Review: NORMAL
WBC Count: 5.7 10*3/uL (ref 4.0–10.5)
nRBC: 0 % (ref 0.0–0.2)

## 2021-12-25 LAB — TSH: TSH: 1.211 u[IU]/mL (ref 0.350–4.500)

## 2021-12-25 MED ORDER — SODIUM CHLORIDE 0.9% FLUSH
10.0000 mL | INTRAVENOUS | Status: DC | PRN
  Administered 2021-12-25: 10 mL

## 2021-12-25 MED ORDER — CYANOCOBALAMIN 1000 MCG/ML IJ SOLN
1000.0000 ug | Freq: Once | INTRAMUSCULAR | Status: AC
  Administered 2021-12-25: 1000 ug via INTRAMUSCULAR

## 2021-12-25 MED ORDER — SODIUM CHLORIDE 0.9 % IV SOLN: Freq: Once | INTRAVENOUS | Status: AC

## 2021-12-25 MED ORDER — SODIUM CHLORIDE 0.9 % IV SOLN
500.0000 mg/m2 | Freq: Once | INTRAVENOUS | Status: AC
  Administered 2021-12-25: 700 mg via INTRAVENOUS
  Filled 2021-12-25: qty 20

## 2021-12-25 MED ORDER — PROCHLORPERAZINE MALEATE 10 MG PO TABS
10.0000 mg | ORAL_TABLET | Freq: Once | ORAL | Status: AC
  Administered 2021-12-25: 10 mg via ORAL

## 2021-12-25 MED ORDER — CYANOCOBALAMIN 1000 MCG/ML IJ SOLN
INTRAMUSCULAR | Status: AC
  Filled 2021-12-25: qty 1

## 2021-12-25 MED ORDER — PROCHLORPERAZINE MALEATE 10 MG PO TABS
ORAL_TABLET | ORAL | Status: AC
  Filled 2021-12-25: qty 1

## 2021-12-25 MED ORDER — HEPARIN SOD (PORK) LOCK FLUSH 100 UNIT/ML IV SOLN
500.0000 [IU] | Freq: Once | INTRAVENOUS | Status: AC | PRN
  Administered 2021-12-25: 500 [IU]

## 2021-12-25 MED ORDER — SODIUM CHLORIDE 0.9 % IV SOLN
200.0000 mg | Freq: Once | INTRAVENOUS | Status: AC
  Administered 2021-12-25: 200 mg via INTRAVENOUS
  Filled 2021-12-25: qty 200

## 2021-12-25 NOTE — Patient Instructions (Signed)
[facility]  Discharge Instructions: Thank you for choosing [facility] to provide your oncology and hematology care.   If you have a lab appointment with the [facility], please go directly to the [facility] and check in at the registration area.   Wear comfortable clothing and clothing appropriate for easy access to any Portacath or PICC line.   We strive to give you quality time with your provider. You may need to reschedule your appointment if you arrive late (15 or more minutes).  Arriving late affects you and other patients whose appointments are after yours.  Also, if you miss three or more appointments without notifying the office, you may be dismissed from the clinic at the provider's discretion.      For prescription refill requests, have your pharmacy contact our office and allow 72 hours for refills to be completed.    Today you received the following chemotherapy and/or immunotherapy agents: Pemetrexed, Pembrolizumab.       To help prevent nausea and vomiting after your treatment, we encourage you to take your nausea medication as directed.  BELOW ARE SYMPTOMS THAT SHOULD BE REPORTED IMMEDIATELY: *FEVER GREATER THAN 100.4 F (38 C) OR HIGHER *CHILLS OR SWEATING *NAUSEA AND VOMITING THAT IS NOT CONTROLLED WITH YOUR NAUSEA MEDICATION *UNUSUAL SHORTNESS OF BREATH *UNUSUAL BRUISING OR BLEEDING *URINARY PROBLEMS (pain or burning when urinating, or frequent urination) *BOWEL PROBLEMS (unusual diarrhea, constipation, pain near the anus) TENDERNESS IN MOUTH AND THROAT WITH OR WITHOUT PRESENCE OF ULCERS (sore throat, sores in mouth, or a toothache) UNUSUAL RASH, SWELLING OR PAIN  UNUSUAL VAGINAL DISCHARGE OR ITCHING   Items with * indicate a potential emergency and should be followed up as soon as possible or go to the Emergency Department if any problems should occur.  Please show the CHEMOTHERAPY ALERT CARD or IMMUNOTHERAPY ALERT  CARD at check-in to the Emergency Department and triage nurse.  Should you have questions after your visit or need to cancel or reschedule your appointment, please contact [facility]  Dept: [phone]  and follow the prompts.  Office hours are 8:00 a.m. to 4:30 p.m. Monday - Friday. Please note that voicemails left after 4:00 p.m. may not be returned until the following business day.  We are closed weekends and major holidays. You have access to a nurse at all times for urgent questions. Please call the main number to the clinic Dept: [phone] and follow the prompts.   For any non-urgent questions, you may also contact your provider using MyChart. We now offer e-Visits for anyone 101 and older to request care online for non-urgent symptoms. For details visit [email or web address].   Also download the MyChart app! Go to the app store, search "MyChart", open the app, select [facility], and log in with your MyChart username and password.  Masks are optional in the cancer centers. If you would like for your care team to wear a mask while they are taking care of you, please let them know. You may have one support person who is at least 56 years old accompany you for your appointments.

## 2021-12-25 NOTE — Progress Notes (Signed)
[place] Telephone:([phone]) [phone]   Fax:([phone]  OFFICE PROGRESS NOTE  [name], FNP [address] [place] [address]  DIAGNOSIS: Stage IV (T2b, N2, M1 C) non-small cell lung cancer, adenocarcinoma presented with right hilar mass in addition to mediastinal and upper abdominal lymphadenopathy in addition to bilateral adrenal metastases and metastatic disease in the musculature of the lower back.  This was diagnosed in April 2023.  DETECTED ALTERATION(S) / BIOMARKER(S) % CFDNA OR AMPLIFICATION ASSOCIATED FDA-APPROVED THERAPIES CLINICAL TRIAL AVAILABILITY [id number] 0.5% None  Yes [id number] (Splice Site Indel) 7.4% None  Yes  PD-L1 expression 0%  PRIOR THERAPY: Palliative radiotherapy to the right hilar region under the care of Dr. Lisbeth Renshaw.  CURRENT THERAPY:  systemic chemotherapy with carboplatin for AUC of 5, Alimta 500 Mg/M2 and Keytruda 200 Mg IV every 3 weeks.  First dose August 29, 2021.  Status post 5 cycles.  Starting from cycle #5 the patient is on maintenance treatment with Alimta and Keytruda every 3 weeks.  INTERVAL HISTORY: April Walsh 56 y.o. female returns to the clinic today for follow-up visit accompanied by a family member.  The patient is feeling fine today with no concerning complaints.  She denied having any chest pain, shortness of breath, cough or hemoptysis.  She has no nausea, vomiting, diarrhea or constipation.  She denied having any headache or visual changes.  She has been tolerating her treatment with chemotherapy fairly well.  She is here for evaluation before starting cycle #6 of her treatment.   MEDICAL HISTORY: Past Medical History:  Diagnosis Date   Anemia    Asthma    Bronchitis    Cancer ([place])    basal cell melanoma, cervical cancer   COPD (chronic obstructive pulmonary disease) ([place])    "early stages of COPD"   Headache    Lung cancer ([place]) 08/13/2021    ALLERGIES:  has No Known  Allergies.  MEDICATIONS:  Current Outpatient Medications  Medication Sig Dispense Refill   apixaban (ELIQUIS) 2.5 MG TABS tablet Take 1 tablet (2.5 mg total) by mouth 2 (two) times daily. 60 tablet 0   atorvastatin (LIPITOR) 40 MG tablet Take 1 tablet (40 mg total) by mouth daily. (Patient not taking: Reported on 10/04/2021) 30 tablet 0   Budeson-Glycopyrrol-Formoterol (BREZTRI AEROSPHERE) 160-9-4.8 MCG/ACT AERO Inhale 2 puffs into the lungs in the morning and at bedtime. 82.7 g 11   folic acid (FOLVITE) 1 MG tablet TAKE 1 TABLET BY MOUTH EVERY DAY 30 tablet 2   lidocaine-prilocaine (EMLA) cream Apply 1 Application topically as needed. 30 g 2   prochlorperazine (COMPAZINE) 10 MG tablet Take 1 tablet (10 mg total) by mouth every 6 (six) hours as needed. 30 tablet 2   triamcinolone cream (KENALOG) 0.1 % Apply 1 application  topically 2 (two) times daily as needed. 80 g 0   No current facility-administered medications for this visit.    SURGICAL HISTORY:  Past Surgical History:  Procedure Laterality Date   APPENDECTOMY     1996   BREAST BIOPSY Right    2010-2015   BRONCHIAL BIOPSY  08/13/2021   Procedure: BRONCHIAL BIOPSIES;  Surgeon: Collene Gobble, MD;  Location: [facility] ENDOSCOPY;  Service: Pulmonary;;   BRONCHIAL BRUSHINGS  08/13/2021   Procedure: BRONCHIAL BRUSHINGS;  Surgeon: Collene Gobble, MD;  Location: [facility] ENDOSCOPY;  Service: Pulmonary;;   BRONCHIAL NEEDLE ASPIRATION BIOPSY  08/13/2021   Procedure: BRONCHIAL NEEDLE ASPIRATION BIOPSIES;  Surgeon: Lamonte Sakai,  Rose Fillers, MD;  Location: [facility] ENDOSCOPY;  Service: Pulmonary;;   CESAREAN SECTION     1984, 1987   FOOT SURGERY Right    2 pins and screw 1999   FOOT SURGERY Left 06/21/2021   screw and 2 pins   HEMOSTASIS CONTROL  08/13/2021   Procedure: HEMOSTASIS CONTROL;  Surgeon: Collene Gobble, MD;  Location: [facility] ENDOSCOPY;  Service: Pulmonary;;   IR IMAGING GUIDED PORT INSERTION  11/26/2021   MELANOMA EXCISION Left    basal cell melanoma 2011    [place]   VIDEO BRONCHOSCOPY  08/13/2021   Procedure: VIDEO BRONCHOSCOPY WITHOUT FLUORO;  Surgeon: Collene Gobble, MD;  Location: [facility] ENDOSCOPY;  Service: Pulmonary;;   VIDEO BRONCHOSCOPY WITH ENDOBRONCHIAL ULTRASOUND N/A 08/13/2021   Procedure: VIDEO BRONCHOSCOPY WITH ENDOBRONCHIAL ULTRASOUND;  Surgeon: Collene Gobble, MD;  Location: [place] ENDOSCOPY;  Service: Pulmonary;  Laterality: N/A;    REVIEW OF SYSTEMS:  A comprehensive review of systems was negative.   PHYSICAL EXAMINATION: General appearance: alert, cooperative, and no distress Head: Normocephalic, without obvious abnormality, atraumatic Neck: no adenopathy, no JVD, supple, symmetrical, trachea midline, and thyroid not enlarged, symmetric, no tenderness/mass/nodules Lymph nodes: Cervical, supraclavicular, and axillary nodes normal. Resp: clear to auscultation bilaterally Back: symmetric, no curvature. ROM normal. No CVA tenderness. Cardio: regular rate and rhythm, S1, S2 normal, no murmur, click, rub or gallop GI: soft, non-tender; bowel sounds normal; no masses,  no organomegaly Extremities: extremities normal, atraumatic, no cyanosis or edema  ECOG PERFORMANCE STATUS: 1 - Symptomatic but completely ambulatory  Blood pressure 108/70, pulse 89, temperature 98.4 F (36.9 C), temperature source Oral, resp. rate 16, height 5' (1.524 m), weight 101 lb 11.2 oz (46.1 kg), SpO2 97 %.  LABORATORY DATA: Lab Results  Component Value Date   WBC 5.7 12/25/2021   HGB 10.0 (L) 12/25/2021   HCT 29.2 (L) 12/25/2021   MCV 102.8 (H) 12/25/2021   PLT 212 12/25/2021      Chemistry      Component Value Date/Time   NA 137 12/25/2021 1040   K 4.0 12/25/2021 1040   CL 108 12/25/2021 1040   CO2 24 12/25/2021 1040   BUN 15 12/25/2021 1040   CREATININE 0.69 12/25/2021 1040      Component Value Date/Time   CALCIUM 9.3 12/25/2021 1040   ALKPHOS 70 12/25/2021 1040   AST 21 12/25/2021 1040   ALT  17 12/25/2021 1040   BILITOT 0.3 12/25/2021 1040       RADIOGRAPHIC STUDIES: CT Chest W Contrast  Result Date: 11/29/2021 CLINICAL DATA:  Primary Cancer Type: Lung Imaging Indication: Assess response to therapy Interval therapy since last imaging? Yes Initial Cancer Diagnosis Date: 08/13/2021; Established by: Biopsy-proven Detailed Pathology: Stage IV non-small cell lung cancer, adenocarcinoma. Primary Tumor location: Right hilum. Bilateral adrenal metastases and metastatic disease in the musculature of the lower back. Surgeries: Appendectomy.  Partial hysterectomy. Chemotherapy: Yes; Ongoing? Yes; Most recent administration: 11/14/2021 Immunotherapy?  Yes; Type: Keytruda; Ongoing? Yes Radiation therapy? Yes; Date Range: 08/22/2021 - 09/04/2021; Target: Right lung Other Cancer Therapies: History of cervical cancer and basal cell skin cancer. * Tracking Code: BO * EXAM: CT CHEST, ABDOMEN, AND PELVIS WITH CONTRAST TECHNIQUE: Multidetector CT imaging of the chest, abdomen and pelvis was performed following the standard protocol during bolus administration of intravenous contrast. RADIATION DOSE REDUCTION: This exam was performed according to the departmental dose-optimization program which includes  automated exposure control, adjustment of the mA and/or kV according to patient size and/or use of iterative reconstruction technique. CONTRAST:  128m OMNIPAQUE IOHEXOL 300 MG/ML  SOLN COMPARISON:  Most recent CT chest 09/11/2021.  08/17/2021 PET-CT. FINDINGS: Cardiovascular: No acute vascular findings. Right IJ Port-A-Cath extends to the upper right atrium. There is stable extrinsic constriction of the right upper lobe vessels by the treated right suprahilar mass. The heart size is normal. There is no pericardial effusion. Mediastinum/Nodes: There is mild residual right perihilar and subcarinal soft tissue thickening, but no residual discrete adenopathy. No new or enlarging mediastinal, hilar or axillary lymph  nodes are identified. Mild esophageal wall thickening attributed to treatment. The thyroid gland and trachea appear unremarkable. Lungs/Pleura: No pleural effusion or pneumothorax. Moderate centrilobular and paraseptal emphysema with diffuse central airway thickening. As above, improved right perihilar soft tissue thickening with persistent extrinsic narrowing of the right upper lobe bronchus. There is improved distension of the bronchus intermedius. No residual discrete mass is identified. There is improved aeration of the right upper and middle lobes. No suspicious pulmonary nodules. Musculoskeletal/Chest wall: No chest wall mass or suspicious osseous findings. Old healed rib fractures bilaterally. CT ABDOMEN AND PELVIS FINDINGS Hepatobiliary: The liver is normal in density without suspicious focal abnormality. No evidence of gallstones, gallbladder wall thickening or biliary dilatation. Pancreas: Unremarkable. No pancreatic ductal dilatation or surrounding inflammatory changes. Spleen: Normal in size without focal abnormality. Adrenals/Urinary Tract: The previously demonstrated bilateral adrenal masses which were hypermetabolic on PET-CT have resolved. There is mild residual thickening of the left adrenal gland limbs which demonstrate low density. The kidneys appear stable without evidence of urinary tract calculus, suspicious lesion or hydronephrosis. Stable 2.9 cm simple cyst in the lower pole of the left kidney which does not require imaging follow-up. The bladder appears normal for its degree of distention. Stomach/Bowel: Enteric contrast was administered and has passed into the rectum. The stomach appears unremarkable for its degree of distension. No evidence of bowel wall thickening, distention or surrounding inflammatory change. Suspected previous appendectomy. Vascular/Lymphatic: No residual enlarged lymph nodes identified within the upper abdomen. No new adenopathy. Diffuse aortic and branch vessel  atherosclerosis without evidence of aneurysm or large vessel occlusion. Reproductive: Hysterectomy.  No adnexal mass. Other: Intact abdominal wall.  No ascites or peritoneal nodularity. Musculoskeletal: No acute or significant osseous findings. No focal mass identified in the erector spinae musculature. Chronic degenerative disc disease at L5-S1. IMPRESSION: 1. Interval response to therapy with a resolution of the right hilar and mediastinal masses, bilateral adrenal metastases and upper abdominal lymphadenopathy. 2. There is mild residual soft tissue thickening around the right hilum with residual narrowing of the right upper lobe bronchus. The right lung aeration has improved. 3. No evidence of progressive disease. 4. Aortic Atherosclerosis (ICD10-I70.0) and Emphysema (ICD10-J43.9). Electronically Signed   By: WRichardean SaleM.D.   On: 11/29/2021 11:11   CT Abdomen Pelvis W Contrast  Result Date: 11/29/2021 CLINICAL DATA:  Primary Cancer Type: Lung Imaging Indication: Assess response to therapy Interval therapy since last imaging? Yes Initial Cancer Diagnosis Date: 08/13/2021; Established by: Biopsy-proven Detailed Pathology: Stage IV non-small cell lung cancer, adenocarcinoma. Primary Tumor location: Right hilum. Bilateral adrenal metastases and metastatic disease in the musculature of the lower back. Surgeries: Appendectomy.  Partial hysterectomy. Chemotherapy: Yes; Ongoing? Yes; Most recent administration: 11/14/2021 Immunotherapy?  Yes; Type: Keytruda; Ongoing? Yes Radiation therapy? Yes; Date Range: 08/22/2021 - 09/04/2021; Target: Right lung Other Cancer Therapies: History of cervical cancer and basal  cell skin cancer. * Tracking Code: BO * EXAM: CT CHEST, ABDOMEN, AND PELVIS WITH CONTRAST TECHNIQUE: Multidetector CT imaging of the chest, abdomen and pelvis was performed following the standard protocol during bolus administration of intravenous contrast. RADIATION DOSE REDUCTION: This exam was performed  according to the departmental dose-optimization program which includes automated exposure control, adjustment of the mA and/or kV according to patient size and/or use of iterative reconstruction technique. CONTRAST:  181m OMNIPAQUE IOHEXOL 300 MG/ML  SOLN COMPARISON:  Most recent CT chest 09/11/2021.  08/17/2021 PET-CT. FINDINGS: Cardiovascular: No acute vascular findings. Right IJ Port-A-Cath extends to the upper right atrium. There is stable extrinsic constriction of the right upper lobe vessels by the treated right suprahilar mass. The heart size is normal. There is no pericardial effusion. Mediastinum/Nodes: There is mild residual right perihilar and subcarinal soft tissue thickening, but no residual discrete adenopathy. No new or enlarging mediastinal, hilar or axillary lymph nodes are identified. Mild esophageal wall thickening attributed to treatment. The thyroid gland and trachea appear unremarkable. Lungs/Pleura: No pleural effusion or pneumothorax. Moderate centrilobular and paraseptal emphysema with diffuse central airway thickening. As above, improved right perihilar soft tissue thickening with persistent extrinsic narrowing of the right upper lobe bronchus. There is improved distension of the bronchus intermedius. No residual discrete mass is identified. There is improved aeration of the right upper and middle lobes. No suspicious pulmonary nodules. Musculoskeletal/Chest wall: No chest wall mass or suspicious osseous findings. Old healed rib fractures bilaterally. CT ABDOMEN AND PELVIS FINDINGS Hepatobiliary: The liver is normal in density without suspicious focal abnormality. No evidence of gallstones, gallbladder wall thickening or biliary dilatation. Pancreas: Unremarkable. No pancreatic ductal dilatation or surrounding inflammatory changes. Spleen: Normal in size without focal abnormality. Adrenals/Urinary Tract: The previously demonstrated bilateral adrenal masses which were hypermetabolic on  PET-CT have resolved. There is mild residual thickening of the left adrenal gland limbs which demonstrate low density. The kidneys appear stable without evidence of urinary tract calculus, suspicious lesion or hydronephrosis. Stable 2.9 cm simple cyst in the lower pole of the left kidney which does not require imaging follow-up. The bladder appears normal for its degree of distention. Stomach/Bowel: Enteric contrast was administered and has passed into the rectum. The stomach appears unremarkable for its degree of distension. No evidence of bowel wall thickening, distention or surrounding inflammatory change. Suspected previous appendectomy. Vascular/Lymphatic: No residual enlarged lymph nodes identified within the upper abdomen. No new adenopathy. Diffuse aortic and branch vessel atherosclerosis without evidence of aneurysm or large vessel occlusion. Reproductive: Hysterectomy.  No adnexal mass. Other: Intact abdominal wall.  No ascites or peritoneal nodularity. Musculoskeletal: No acute or significant osseous findings. No focal mass identified in the erector spinae musculature. Chronic degenerative disc disease at L5-S1. IMPRESSION: 1. Interval response to therapy with a resolution of the right hilar and mediastinal masses, bilateral adrenal metastases and upper abdominal lymphadenopathy. 2. There is mild residual soft tissue thickening around the right hilum with residual narrowing of the right upper lobe bronchus. The right lung aeration has improved. 3. No evidence of progressive disease. 4. Aortic Atherosclerosis (ICD10-I70.0) and Emphysema (ICD10-J43.9). Electronically Signed   By: WRichardean SaleM.D.   On: 11/29/2021 11:11   IR IMAGING GUIDED PORT INSERTION  Result Date: 11/26/2021 INDICATION: 56year old female with advanced stage lung cancer presenting for Port-A-Cath placement for chemotherapy. EXAM: IMPLANTED PORT A CATH PLACEMENT WITH ULTRASOUND AND FLUOROSCOPIC GUIDANCE COMPARISON:  None  Available. MEDICATIONS: None. ANESTHESIA/SEDATION: Moderate (conscious) sedation was employed during this procedure.  A total of Versed 2 mg and Fentanyl 100 mcg was administered intravenously. Moderate Sedation Time: 18 minutes. The patient's level of consciousness and vital signs were monitored continuously by radiology nursing throughout the procedure under my direct supervision. CONTRAST:  None FLUOROSCOPY TIME:  0 mGy COMPLICATIONS: None immediate. PROCEDURE: The procedure, risks, benefits, and alternatives were explained to the patient. Questions regarding the procedure were encouraged and answered. The patient understands and consents to the procedure. The right neck and chest were prepped with chlorhexidine in a sterile fashion, and a sterile drape was applied covering the operative field. Maximum barrier sterile technique with sterile gowns and gloves were used for the procedure. A timeout was performed prior to the initiation of the procedure. Ultrasound was used to examine the jugular vein which was compressible and free of internal echoes. A skin marker was used to demarcate the planned venotomy and port pocket incision sites. Local anesthesia was provided to these sites and the subcutaneous tunnel track with 1% lidocaine with 1:100,000 epinephrine. A small incision was created at the jugular access site and blunt dissection was performed of the subcutaneous tissues. Under ultrasound guidance, the jugular vein was accessed with a 21 ga micropuncture needle and an 0.018" wire was inserted to the superior vena cava. Real-time ultrasound guidance was utilized for vascular access including the acquisition of a permanent ultrasound image documenting patency of the accessed vessel. A 5 Fr micopuncture set was then used, through which a 0.035" Rosen wire was passed under fluoroscopic guidance into the inferior vena cava. An 8 Fr dilator was then placed over the wire. A subcutaneous port pocket was then created  along the upper chest wall utilizing a combination of sharp and blunt dissection. The pocket was irrigated with sterile saline, packed with gauze, and observed for hemorrhage. A single lumen "ISP" sized power injectable port was chosen for placement. The 8 Fr catheter was tunneled from the port pocket site to the venotomy incision. The port was placed in the pocket. The external catheter was trimmed to appropriate length. The dilator was exchanged for an 8 Fr peel-away sheath under fluoroscopic guidance. The catheter was then placed through the sheath and the sheath was removed. Final catheter positioning was confirmed and documented with a fluoroscopic spot radiograph. The port was accessed with a Huber needle, aspirated, and flushed with heparinized saline. The deep dermal layer of the port pocket incision was closed with interrupted 3-0 Vicryl suture. Dermabond was then placed over the port pocket and neck incisions. The patient tolerated the procedure well without immediate post procedural complication. FINDINGS: After catheter placement, the tip lies within the superior cavoatrial junction. The catheter aspirates and flushes normally and is ready for immediate use. IMPRESSION: Successful placement of a power injectable Port-A-Cath via the right internal jugular vein. The catheter is ready for immediate use. Ruthann Cancer, MD [facility] Electronically Signed   By: Ruthann Cancer M.D.   On: 11/26/2021 16:14    ASSESSMENT AND PLAN: This is a very pleasant 56 years old white female with Stage IV (T2b, N2, M1 C) non-small cell lung cancer, adenocarcinoma presented with right hilar mass in addition to mediastinal and upper abdominal lymphadenopathy in addition to bilateral adrenal metastases and metastatic disease in the musculature of the lower back.  This was diagnosed in April 2023. The molecular studies by [employer] showed no actionable mutations and  the patient has negative PD-L1 expression. She is currently undergoing palliative radiotherapy to the  right hilar region under the care of Dr. Lisbeth Renshaw. The patient is currently on systemic chemotherapy started with carboplatin for AUC of 5, Alimta 500 Mg/M2 and Keytruda 200 Mg IV every 3 weeks for 4 cycles and starting from cycle #5 she is on maintenance treatment with Alimta and Keytruda every 3 weeks.  Status post a total of 5 cycles. The patient has been tolerating this treatment well with no concerning adverse effects. I recommended for her to proceed with cycle #6 today as planned. I will see her back for follow-up visit in 3 weeks for evaluation before starting cycle #7.  I will consider repeating her imaging studies after the next cycle of her treatment. The patient was advised to call immediately if she has any other concerning symptoms in the interval. The patient voices understanding of current disease status and treatment options and is in agreement with the current care plan.  All questions were answered. The patient knows to call the clinic with any problems, questions or concerns. We can certainly see the patient much sooner if necessary.  The total time spent in the appointment was 20 minutes.  Disclaimer: This note was dictated with voice recognition software. Similar sounding words can inadvertently be transcribed and may not be corrected upon review.

## 2021-12-25 NOTE — Patient Instructions (Signed)
[employer], Adult A central line is a long, thin tube (catheter) that is put into a vein so that it goes to a large vein above your heart. It can be used to: Give you medicine or fluids. Give you food and nutrients. Take blood or give you blood for testing or treatments. Types of central lines There are four main types of central lines: Peripherally inserted central catheter (PICC) line. This type is usually put in the upper arm and goes up the arm to the heart. Tunneled central line. This type is placed in a large vein in the neck, chest, or groin. It is tunneled under the skin and brought out through a second incision. Non-tunneled central line. This type is used for a shorter time than other types, usually for 7 days at the most. It is inserted in the neck, chest, or groin. Implanted port. This type can stay in place longer than other types of central lines. It is normally put in the upper chest but can also be placed in the upper arm or the belly. Surgery is needed to put it in and take it out. The type of central line you get will depend on how long you need it and your medical condition. Tell a doctor about: Any allergies you have. All medicines you are taking. These include vitamins, herbs, eye drops, creams, and over-the-counter medicines. Any problems you or family members have had with anesthetic medicines. Any blood disorders you have. Any surgeries you have had. Any medical conditions you have. Whether you are pregnant or may be pregnant. What are the risks? Generally, central lines are safe. However, problems may occur, including: Infection. A blood clot. Bleeding from the place where the central line was inserted. Getting a hole or crack in the central line. If this happens, the central line will need to be replaced. Central line failure. The catheter moving or coming out of place. What happens before the procedure? Medicines Ask your doctor about changing or  stopping: Your normal medicines. Vitamins, herbs, and supplements. Over-the-counter medicines. Do not take aspirin or ibuprofen unless you are told to. General instructions Follow instructions from your doctor about eating or drinking. For your safety, your doctor may: [place] the area of the procedure. Remove hair at the procedure site. Ask you to wash with a soap that kills germs. Plan to have a responsible adult take you home from the hospital or clinic. If you will be going home right after the procedure, plan to have a responsible adult care for you for the time you are told. This is important. What happens during the procedure? An IV tube will be put into one of your veins. You may be given: A sedative. This medicine helps you relax. Anesthetics. These medicines numb certain areas of your body. Your skin will be cleaned with a germ-killing (antiseptic) solution. You may be covered with clean drapes. Your blood pressure, heart rate, breathing rate, and blood oxygen level will be monitored during the procedure. The central line will be put into the vein and moved through it to the correct spot. The doctor may use X-ray equipment to help guide the central line to the right place. A bandage (dressing) will be placed over the insertion area. The procedure may vary among doctors and hospitals. What can I expect after the procedure? You will be monitored until you leave the hospital or clinic. This includes checking your blood pressure, heart rate, breathing rate, and blood oxygen level. Caps may  be placed on the ends of the central line tubing. If you were given a sedative during your procedure, do not drive or use machines until your doctor says that it is safe. Follow these instructions at home: Caring for the tube  Follow instructions from your doctor about: Flushing the tube. Cleaning the tube and the area around it. Only use germ-free (sterile) supplies to flush. The supplies  should be from your doctor, a pharmacy, or another place that your doctor recommends. Before you flush the tube or clean the area around the tube: Wash your hands with soap and water for at least 20 seconds. If you cannot use soap and water, use hand sanitizer. Clean the central line hub with rubbing alcohol. To do this: Scrub it using a twisting motion and rub for 10 to 15 seconds or for 30 twists. Follow the manufacturer's instructions. Be sure you scrub the top of the hub, not just the sides. Never reuse alcohol pads. Let the hub dry before use. Keep it from touching anything while drying. Caring for your skin Check the skin around the central line every day for signs of infection. Check for: Redness, swelling, or pain. Fluid or blood. Warmth. Pus or a bad smell. Keep the area where the tube was put in clean and dry. Change bandages only as told by your doctor. Keep your bandage dry. If a bandage gets wet, have it changed right away. General instructions Keep the tube clamped, unless it is being used. If you or someone else accidentally pulls on the tube, make sure: The bandage is okay. There is no bleeding. The tube has not been pulled out. Do not use scissors or sharp objects near the tube. Do not take baths, swim, or use a hot tub until your doctor says it is okay. Ask your doctor if you may take showers. You may only be allowed to take sponge baths. Ask your doctor what activities are safe for you. Your doctor may tell you not to lift anything or move your arm too much. Take over-the-counter and prescription medicines only as told by your doctor. Keep all follow-up visits. Storing and throwing away supplies Keep your supplies in a clean, dry location. Throw away any used syringes in a container that is only for sharp items (sharps container). You can buy a sharps container from a pharmacy, or you can make one by using an empty hard plastic bottle with a cover. Place any used  bandages or infusion bags into a plastic bag. Throw that bag in the trash. Contact a doctor if: You have any of these signs of infection where the tube was put in: Redness, swelling, or pain. Fluid or blood. Warmth. Pus or a bad smell. Get help right away if: You have: A fever or chills. Shortness of breath. Pain in your chest. A fast heartbeat. Swelling in your neck, face, chest, or arm. You feel dizzy or you faint. There are red lines coming from where the tube was put in. The area where the tube was put in is bleeding and the bleeding will not stop. Your tube is hard to flush. You do not get a blood return from the tube. The tube gets loose or comes out. The tube has a hole or a tear. The tube leaks. Summary A central line is a long, thin tube (catheter) that is put in your vein. It can be used to give you medicine, food, or fluids. Follow instructions from your doctor about flushing  and cleaning the tube. Keep the area where the tube was put in clean and dry. Ask your doctor what activities are safe for you. This information is not intended to replace advice given to you by your health care provider. Make sure you discuss any questions you have with your health care provider. Document Revised: 12/30/2019 Document Reviewed: 12/30/2019 Elsevier Patient Education  [place].

## 2022-01-01 ENCOUNTER — Other Ambulatory Visit: Payer: BC Managed Care – PPO

## 2022-01-08 ENCOUNTER — Other Ambulatory Visit: Payer: BC Managed Care – PPO

## 2022-01-16 ENCOUNTER — Encounter: Payer: Self-pay | Admitting: Internal Medicine

## 2022-01-16 ENCOUNTER — Inpatient Hospital Stay: Payer: BC Managed Care – PPO

## 2022-01-16 ENCOUNTER — Other Ambulatory Visit: Payer: BC Managed Care – PPO

## 2022-01-16 ENCOUNTER — Inpatient Hospital Stay: Payer: BC Managed Care – PPO | Attending: Radiation Oncology | Admitting: Internal Medicine

## 2022-01-16 ENCOUNTER — Other Ambulatory Visit: Payer: Self-pay

## 2022-01-16 VITALS — BP 102/68 | HR 96 | Resp 16

## 2022-01-16 VITALS — BP 96/68 | HR 94 | Temp 97.9°F | Resp 15 | Wt 101.0 lb

## 2022-01-16 DIAGNOSIS — I63511 Cerebral infarction due to unspecified occlusion or stenosis of right middle cerebral artery: Secondary | ICD-10-CM | POA: Insufficient documentation

## 2022-01-16 DIAGNOSIS — C3491 Malignant neoplasm of unspecified part of right bronchus or lung: Secondary | ICD-10-CM | POA: Diagnosis not present

## 2022-01-16 DIAGNOSIS — C3490 Malignant neoplasm of unspecified part of unspecified bronchus or lung: Secondary | ICD-10-CM | POA: Diagnosis not present

## 2022-01-16 DIAGNOSIS — Z923 Personal history of irradiation: Secondary | ICD-10-CM | POA: Diagnosis not present

## 2022-01-16 DIAGNOSIS — Z79634 Long term (current) use of topoisomerase inhibitor: Secondary | ICD-10-CM | POA: Diagnosis not present

## 2022-01-16 DIAGNOSIS — Z87891 Personal history of nicotine dependence: Secondary | ICD-10-CM | POA: Insufficient documentation

## 2022-01-16 DIAGNOSIS — Z5112 Encounter for antineoplastic immunotherapy: Secondary | ICD-10-CM | POA: Diagnosis not present

## 2022-01-16 DIAGNOSIS — Z79899 Other long term (current) drug therapy: Secondary | ICD-10-CM | POA: Diagnosis not present

## 2022-01-16 DIAGNOSIS — Z5111 Encounter for antineoplastic chemotherapy: Secondary | ICD-10-CM | POA: Insufficient documentation

## 2022-01-16 DIAGNOSIS — Z7901 Long term (current) use of anticoagulants: Secondary | ICD-10-CM | POA: Diagnosis not present

## 2022-01-16 LAB — CMP (CANCER CENTER ONLY)
ALT: 9 U/L (ref 0–44)
AST: 19 U/L (ref 15–41)
Albumin: 3.9 g/dL (ref 3.5–5.0)
Alkaline Phosphatase: 77 U/L (ref 38–126)
Anion gap: 6 (ref 5–15)
BUN: 16 mg/dL (ref 6–20)
CO2: 27 mmol/L (ref 22–32)
Calcium: 9.5 mg/dL (ref 8.9–10.3)
Chloride: 106 mmol/L (ref 98–111)
Creatinine: 0.63 mg/dL (ref 0.44–1.00)
GFR, Estimated: >60 mL/min (ref >60)
Glucose, Bld: 115 mg/dL — ABNORMAL HIGH (ref 70–99)
Potassium: 4.1 mmol/L (ref 3.5–5.1)
Sodium: 139 mmol/L (ref 135–145)
Total Bilirubin: 0.3 mg/dL (ref 0.3–1.2)
Total Protein: 6.9 g/dL (ref 6.5–8.1)

## 2022-01-16 LAB — CBC WITH DIFFERENTIAL (CANCER CENTER ONLY)
Abs Immature Granulocytes: 0.03 10*3/uL (ref 0.00–0.07)
Basophils Absolute: 0.1 10*3/uL (ref 0.0–0.1)
Basophils Relative: 1 %
Eosinophils Absolute: 0.2 10*3/uL (ref 0.0–0.5)
Eosinophils Relative: 3 %
HCT: 31.9 % — ABNORMAL LOW (ref 36.0–46.0)
Hemoglobin: 10.9 g/dL — ABNORMAL LOW (ref 12.0–15.0)
Immature Granulocytes: 1 %
Lymphocytes Relative: 23 %
Lymphs Abs: 1.3 10*3/uL (ref 0.7–4.0)
MCH: 36 pg — ABNORMAL HIGH (ref 26.0–34.0)
MCHC: 34.2 g/dL (ref 30.0–36.0)
MCV: 105.3 fL — ABNORMAL HIGH (ref 80.0–100.0)
Monocytes Absolute: 0.9 10*3/uL (ref 0.1–1.0)
Monocytes Relative: 16 %
Neutro Abs: 3.1 10*3/uL (ref 1.7–7.7)
Neutrophils Relative %: 56 %
Platelet Count: 203 10*3/uL (ref 150–400)
RBC: 3.03 MIL/uL — ABNORMAL LOW (ref 3.87–5.11)
RDW: 16 % — ABNORMAL HIGH (ref 11.5–15.5)
Smear Review: NORMAL
WBC Count: 5.6 10*3/uL (ref 4.0–10.5)
nRBC: 0 % (ref 0.0–0.2)

## 2022-01-16 LAB — TSH: TSH: 1.132 u[IU]/mL (ref 0.350–4.500)

## 2022-01-16 MED ORDER — SODIUM CHLORIDE 0.9 % IV SOLN: Freq: Once | INTRAVENOUS | Status: AC

## 2022-01-16 MED ORDER — SODIUM CHLORIDE 0.9% FLUSH
10.0000 mL | INTRAVENOUS | Status: DC | PRN
  Administered 2022-01-16: 10 mL

## 2022-01-16 MED ORDER — PROCHLORPERAZINE MALEATE 10 MG PO TABS
10.0000 mg | ORAL_TABLET | Freq: Once | ORAL | Status: AC
  Administered 2022-01-16: 10 mg via ORAL
  Filled 2022-01-16: qty 1

## 2022-01-16 MED ORDER — SODIUM CHLORIDE 0.9% FLUSH
10.0000 mL | INTRAVENOUS | Status: DC | PRN
  Administered 2022-01-16: 10 mL via INTRAVENOUS

## 2022-01-16 MED ORDER — HEPARIN SOD (PORK) LOCK FLUSH 100 UNIT/ML IV SOLN
500.0000 [IU] | Freq: Once | INTRAVENOUS | Status: AC | PRN
  Administered 2022-01-16: 500 [IU]

## 2022-01-16 MED ORDER — SODIUM CHLORIDE 0.9 % IV SOLN
500.0000 mg/m2 | Freq: Once | INTRAVENOUS | Status: AC
  Administered 2022-01-16: 700 mg via INTRAVENOUS
  Filled 2022-01-16: qty 20

## 2022-01-16 MED ORDER — SODIUM CHLORIDE 0.9 % IV SOLN
200.0000 mg | Freq: Once | INTRAVENOUS | Status: AC
  Administered 2022-01-16: 200 mg via INTRAVENOUS
  Filled 2022-01-16: qty 200

## 2022-01-16 NOTE — Patient Instructions (Signed)
[facility]  Discharge Instructions: Thank you for choosing [facility] to provide your oncology and hematology care.   If you have a lab appointment with the [facility], please go directly to the [facility] and check in at the registration area.   Wear comfortable clothing and clothing appropriate for easy access to any Portacath or PICC line.   We strive to give you quality time with your provider. You may need to reschedule your appointment if you arrive late (15 or more minutes).  Arriving late affects you and other patients whose appointments are after yours.  Also, if you miss three or more appointments without notifying the office, you may be dismissed from the clinic at the provider's discretion.      For prescription refill requests, have your pharmacy contact our office and allow 72 hours for refills to be completed.    Today you received the following chemotherapy and/or immunotherapy agents: Keytruda and Alimta      To help prevent nausea and vomiting after your treatment, we encourage you to take your nausea medication as directed.  BELOW ARE SYMPTOMS THAT SHOULD BE REPORTED IMMEDIATELY: *FEVER GREATER THAN 100.4 F (38 C) OR HIGHER *CHILLS OR SWEATING *NAUSEA AND VOMITING THAT IS NOT CONTROLLED WITH YOUR NAUSEA MEDICATION *UNUSUAL SHORTNESS OF BREATH *UNUSUAL BRUISING OR BLEEDING *URINARY PROBLEMS (pain or burning when urinating, or frequent urination) *BOWEL PROBLEMS (unusual diarrhea, constipation, pain near the anus) TENDERNESS IN MOUTH AND THROAT WITH OR WITHOUT PRESENCE OF ULCERS (sore throat, sores in mouth, or a toothache) UNUSUAL RASH, SWELLING OR PAIN  UNUSUAL VAGINAL DISCHARGE OR ITCHING   Items with * indicate a potential emergency and should be followed up as soon as possible or go to the Emergency Department if any problems should occur.  Please show the CHEMOTHERAPY ALERT CARD or IMMUNOTHERAPY ALERT CARD at  check-in to the Emergency Department and triage nurse.  Should you have questions after your visit or need to cancel or reschedule your appointment, please contact [facility]  Dept: [phone]  and follow the prompts.  Office hours are 8:00 a.m. to 4:30 p.m. Monday - Friday. Please note that voicemails left after 4:00 p.m. may not be returned until the following business day.  We are closed weekends and major holidays. You have access to a nurse at all times for urgent questions. Please call the main number to the clinic Dept: [phone] and follow the prompts.   For any non-urgent questions, you may also contact your provider using MyChart. We now offer e-Visits for anyone 68 and older to request care online for non-urgent symptoms. For details visit [email or web address].   Also download the MyChart app! Go to the app store, search "MyChart", open the app, select [facility], and log in with your MyChart username and password.  Masks are optional in the cancer centers. If you would like for your care team to wear a mask while they are taking care of you, please let them know. You may have one support person who is at least 56 years old accompany you for your appointments.

## 2022-01-16 NOTE — Progress Notes (Signed)
[facility] Telephone:([phone]   Fax:([phone]  OFFICE PROGRESS NOTE  [name], FNP [address] [place] [address]  DIAGNOSIS: Stage IV (T2b, N2, M1 C) non-small cell lung cancer, adenocarcinoma presented with right hilar mass in addition to mediastinal and upper abdominal lymphadenopathy in addition to bilateral adrenal metastases and metastatic disease in the musculature of the lower back.  This was diagnosed in April 2023.  DETECTED ALTERATION(S) / BIOMARKER(S) % CFDNA OR AMPLIFICATION ASSOCIATED FDA-APPROVED THERAPIES CLINICAL TRIAL AVAILABILITY [id number] 0.5% None  Yes [id number] (Splice Site Indel) 2.5% None  Yes  PD-L1 expression 0%  PRIOR THERAPY: Palliative radiotherapy to the right hilar region under the care of Dr. Lisbeth Renshaw.  CURRENT THERAPY:  systemic chemotherapy with carboplatin for AUC of 5, Alimta 500 Mg/M2 and Keytruda 200 Mg IV every 3 weeks.  First dose August 29, 2021.  Status post 6 cycles.  Starting from cycle #5 the patient is on maintenance treatment with Alimta and Keytruda every 3 weeks.  INTERVAL HISTORY: April Walsh 56 y.o. female returns to the clinic today for follow-up visit accompanied by her sister.  The patient is feeling fine today with no concerning complaints.  She denied having any chest pain, shortness of breath, cough or hemoptysis.  She has no nausea, vomiting, diarrhea or constipation.  She has no headache or visual changes.  She denied having any weight loss or night sweats.  She continues to tolerate her maintenance treatment with Alimta and Keytruda fairly well.  The patient is here today for evaluation before starting cycle #7 of her treatment.  MEDICAL HISTORY: Past Medical History:  Diagnosis Date   Anemia    Asthma    Bronchitis    Cancer ([facility])    basal cell melanoma, cervical cancer   COPD (chronic obstructive pulmonary disease) ([facility])    "early stages of COPD"    Headache    Lung cancer ([facility]) 08/13/2021    ALLERGIES:  has No Known Allergies.  MEDICATIONS:  Current Outpatient Medications  Medication Sig Dispense Refill   apixaban (ELIQUIS) 2.5 MG TABS tablet Take 1 tablet (2.5 mg total) by mouth 2 (two) times daily. 60 tablet 0   atorvastatin (LIPITOR) 40 MG tablet Take 1 tablet (40 mg total) by mouth daily. (Patient not taking: Reported on 10/04/2021) 30 tablet 0   Budeson-Glycopyrrol-Formoterol (BREZTRI AEROSPHERE) 160-9-4.8 MCG/ACT AERO Inhale 2 puffs into the lungs in the morning and at bedtime. 05.3 g 11   folic acid (FOLVITE) 1 MG tablet TAKE 1 TABLET BY MOUTH EVERY DAY 30 tablet 2   lidocaine-prilocaine (EMLA) cream Apply 1 Application topically as needed. 30 g 2   prochlorperazine (COMPAZINE) 10 MG tablet Take 1 tablet (10 mg total) by mouth every 6 (six) hours as needed. 30 tablet 2   triamcinolone cream (KENALOG) 0.1 % Apply 1 application  topically 2 (two) times daily as needed. 80 g 0   No current facility-administered medications for this visit.   Facility-Administered Medications Ordered in Other Visits  Medication Dose Route Frequency Provider Last Rate Last Admin   cyanocobalamin (VITAMIN B12) 1000 MCG/ML injection            prochlorperazine (COMPAZINE) 10 MG tablet            sodium chloride flush (NS) 0.9 % injection 10 mL  10 mL Intravenous PRN Curt Bears, MD   10 mL at 01/16/22 0932    SURGICAL HISTORY:  Past  Surgical History:  Procedure Laterality Date   APPENDECTOMY     1996   BREAST BIOPSY Right    2010-2015   BRONCHIAL BIOPSY  08/13/2021   Procedure: BRONCHIAL BIOPSIES;  Surgeon: Collene Gobble, MD;  Location[facility] ENDOSCOPY;  Service: Pulmonary;;   BRONCHIAL BRUSHINGS  08/13/2021   Procedure: BRONCHIAL BRUSHINGS;  Surgeon: Collene Gobble, MD;  Location: [facility] ENDOSCOPY;  Service: Pulmonary;;   BRONCHIAL NEEDLE ASPIRATION BIOPSY  08/13/2021   Procedure: BRONCHIAL NEEDLE ASPIRATION BIOPSIES;  Surgeon: Collene Gobble,  MD;  Location: [facility] ENDOSCOPY;  Service: Pulmonary;;   CESAREAN [place] Right    2 pins and screw 1999   FOOT SURGERY Left 06/21/2021   screw and 2 pins   HEMOSTASIS CONTROL  08/13/2021   Procedure: HEMOSTASIS CONTROL;  Surgeon: Collene Gobble, MD;  Location: [facility] ENDOSCOPY;  Service: Pulmonary;;   IR IMAGING GUIDED PORT INSERTION  11/26/2021   MELANOMA EXCISION Left    basal cell melanoma 2011   [place]   VIDEO BRONCHOSCOPY  08/13/2021   Procedure: VIDEO BRONCHOSCOPY WITHOUT FLUORO;  Surgeon: Collene Gobble, MD;  Location: [facility] ENDOSCOPY;  Service: Pulmonary;;   VIDEO BRONCHOSCOPY WITH ENDOBRONCHIAL ULTRASOUND N/A 08/13/2021   Procedure: VIDEO BRONCHOSCOPY WITH ENDOBRONCHIAL ULTRASOUND;  Surgeon: Collene Gobble, MD;  Location: [facility] ENDOSCOPY;  Service: Pulmonary;  Laterality: N/A;    REVIEW OF SYSTEMS:  A comprehensive review of systems was negative.   PHYSICAL EXAMINATION: General appearance: alert, cooperative, and no distress Head: Normocephalic, without obvious abnormality, atraumatic Neck: no adenopathy, no JVD, supple, symmetrical, trachea midline, and thyroid not enlarged, symmetric, no tenderness/mass/nodules Lymph nodes: Cervical, supraclavicular, and axillary nodes normal. Resp: clear to auscultation bilaterally Back: symmetric, no curvature. ROM normal. No CVA tenderness. Cardio: regular rate and rhythm, S1, S2 normal, no murmur, click, rub or gallop GI: soft, non-tender; bowel sounds normal; no masses,  no organomegaly Extremities: extremities normal, atraumatic, no cyanosis or edema  ECOG PERFORMANCE STATUS: 1 - Symptomatic but completely ambulatory  Blood pressure 96/68, pulse 94, temperature 97.9 F (36.6 C), temperature source Oral, resp. rate 15, weight 101 lb (45.8 kg), SpO2 98 %.  LABORATORY DATA: Lab Results  Component Value Date   WBC 5.6 01/16/2022   HGB 10.9 (L) 01/16/2022   HCT 31.9  (L) 01/16/2022   MCV 105.3 (H) 01/16/2022   PLT 203 01/16/2022      Chemistry      Component Value Date/Time   NA 137 12/25/2021 1040   K 4.0 12/25/2021 1040   CL 108 12/25/2021 1040   CO2 24 12/25/2021 1040   BUN 15 12/25/2021 1040   CREATININE 0.69 12/25/2021 1040      Component Value Date/Time   CALCIUM 9.3 12/25/2021 1040   ALKPHOS 70 12/25/2021 1040   AST 21 12/25/2021 1040   ALT 17 12/25/2021 1040   BILITOT 0.3 12/25/2021 1040       RADIOGRAPHIC STUDIES: No results found.  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  This was diagnosed in April 2023. The molecular studies by [employer] showed no  actionable mutations and the patient has negative PD-L1 expression. She is currently undergoing palliative radiotherapy to the right hilar region under the care of Dr. Lisbeth Renshaw. The patient is currently on systemic chemotherapy started with carboplatin for AUC of 5, Alimta 500 Mg/M2 and Keytruda 200 Mg IV every 3 weeks and starting from cycle #5 she is on maintenance treatment with Alimta and Keytruda every 3 weeks.  Status post a total of 6 cycles. The patient has been tolerating this treatment well with no concerning adverse effects. I recommended for her to proceed with cycle #7 today as planned. I will see her back for follow-up visit in 3 weeks for evaluation with repeat CT scan of the chest, abdomen and pelvis for restaging of her disease before starting cycle #8. The patient is expected to have deep cleaning of her teeth and I recommended for her to do it the week before her treatment. She was advised to call immediately if she has any other concerning symptoms in the interval. The  patient voices understanding of current disease status and treatment options and is in agreement with the current care plan.  All questions were answered. The patient knows to call the clinic with any problems, questions or concerns. We can certainly see the patient much sooner if necessary.  The total time spent in the appointment was 20 minutes.  Disclaimer: This note was dictated with voice recognition software. Similar sounding words can inadvertently be transcribed and may not be corrected upon review.

## 2022-01-18 ENCOUNTER — Telehealth: Payer: Self-pay

## 2022-01-18 NOTE — Telephone Encounter (Signed)
Message received from [facility] with [facility] requesting dental clearence.  I have called back and left a detailed message advising that pt was last seen 01/16/22 and progress note indicates "The patient is expected to have deep cleaning of her teeth and I recommended for her to do it the week before her treatment". I have advised pts next tx is 02/06/22 and to call us back if they have further questions.

## 2022-01-23 ENCOUNTER — Other Ambulatory Visit: Payer: BC Managed Care – PPO

## 2022-01-25 ENCOUNTER — Other Ambulatory Visit: Payer: Self-pay | Admitting: Physician Assistant

## 2022-01-25 DIAGNOSIS — C3401 Malignant neoplasm of right main bronchus: Secondary | ICD-10-CM

## 2022-01-30 ENCOUNTER — Other Ambulatory Visit: Payer: BC Managed Care – PPO

## 2022-02-04 ENCOUNTER — Telehealth: Payer: Self-pay | Admitting: Internal Medicine

## 2022-02-04 ENCOUNTER — Encounter (HOSPITAL_COMMUNITY): Payer: Self-pay

## 2022-02-04 ENCOUNTER — Ambulatory Visit (HOSPITAL_COMMUNITY)
Admission: RE | Admit: 2022-02-04 | Discharge: 2022-02-04 | Disposition: A | Discharge status: Home / Self Care | Payer: BC Managed Care – PPO | Source: Ambulatory Visit | Attending: Internal Medicine | Admitting: Internal Medicine

## 2022-02-04 DIAGNOSIS — C3490 Malignant neoplasm of unspecified part of unspecified bronchus or lung: Secondary | ICD-10-CM | POA: Insufficient documentation

## 2022-02-04 DIAGNOSIS — K7689 Other specified diseases of liver: Secondary | ICD-10-CM | POA: Diagnosis not present

## 2022-02-04 DIAGNOSIS — R911 Solitary pulmonary nodule: Secondary | ICD-10-CM | POA: Diagnosis not present

## 2022-02-04 DIAGNOSIS — J439 Emphysema, unspecified: Secondary | ICD-10-CM | POA: Diagnosis not present

## 2022-02-04 MED ORDER — SODIUM CHLORIDE (PF) 0.9 % IJ SOLN
INTRAMUSCULAR | Status: AC
  Filled 2022-02-04: qty 50

## 2022-02-04 MED ORDER — IOHEXOL 300 MG/ML  SOLN
100.0000 mL | Freq: Once | INTRAMUSCULAR | Status: AC | PRN
  Administered 2022-02-04: 100 mL via INTRAVENOUS

## 2022-02-04 NOTE — Telephone Encounter (Signed)
Called patient regarding upcoming September and October appointments, patient is notified.

## 2022-02-06 ENCOUNTER — Other Ambulatory Visit: Payer: BC Managed Care – PPO

## 2022-02-06 ENCOUNTER — Other Ambulatory Visit: Payer: Self-pay

## 2022-02-06 ENCOUNTER — Inpatient Hospital Stay: Payer: BC Managed Care – PPO

## 2022-02-06 ENCOUNTER — Inpatient Hospital Stay (HOSPITAL_BASED_OUTPATIENT_CLINIC_OR_DEPARTMENT_OTHER): Payer: BC Managed Care – PPO | Admitting: Internal Medicine

## 2022-02-06 ENCOUNTER — Encounter: Payer: Self-pay | Admitting: Internal Medicine

## 2022-02-06 VITALS — BP 114/65 | HR 90 | Temp 98.2°F | Resp 15 | Wt 105.7 lb

## 2022-02-06 DIAGNOSIS — C3491 Malignant neoplasm of unspecified part of right bronchus or lung: Secondary | ICD-10-CM

## 2022-02-06 DIAGNOSIS — Z7901 Long term (current) use of anticoagulants: Secondary | ICD-10-CM | POA: Diagnosis not present

## 2022-02-06 DIAGNOSIS — I63511 Cerebral infarction due to unspecified occlusion or stenosis of right middle cerebral artery: Secondary | ICD-10-CM | POA: Diagnosis not present

## 2022-02-06 DIAGNOSIS — Z87891 Personal history of nicotine dependence: Secondary | ICD-10-CM | POA: Diagnosis not present

## 2022-02-06 DIAGNOSIS — Z5111 Encounter for antineoplastic chemotherapy: Secondary | ICD-10-CM | POA: Diagnosis not present

## 2022-02-06 DIAGNOSIS — Z923 Personal history of irradiation: Secondary | ICD-10-CM | POA: Diagnosis not present

## 2022-02-06 DIAGNOSIS — Z79899 Other long term (current) drug therapy: Secondary | ICD-10-CM | POA: Diagnosis not present

## 2022-02-06 DIAGNOSIS — Z5112 Encounter for antineoplastic immunotherapy: Secondary | ICD-10-CM | POA: Diagnosis not present

## 2022-02-06 LAB — CMP (CANCER CENTER ONLY)
ALT: 14 U/L (ref 0–44)
AST: 22 U/L (ref 15–41)
Albumin: 3.6 g/dL (ref 3.5–5.0)
Alkaline Phosphatase: 80 U/L (ref 38–126)
Anion gap: 3 — ABNORMAL LOW (ref 5–15)
BUN: 14 mg/dL (ref 6–20)
CO2: 26 mmol/L (ref 22–32)
Calcium: 8.9 mg/dL (ref 8.9–10.3)
Chloride: 107 mmol/L (ref 98–111)
Creatinine: 0.53 mg/dL (ref 0.44–1.00)
GFR, Estimated: >60 mL/min (ref >60)
Glucose, Bld: 71 mg/dL (ref 70–99)
Potassium: 4.5 mmol/L (ref 3.5–5.1)
Sodium: 136 mmol/L (ref 135–145)
Total Bilirubin: 0.2 mg/dL — ABNORMAL LOW (ref 0.3–1.2)
Total Protein: 6.7 g/dL (ref 6.5–8.1)

## 2022-02-06 LAB — CBC WITH DIFFERENTIAL (CANCER CENTER ONLY)
Abs Immature Granulocytes: 0.02 10*3/uL (ref 0.00–0.07)
Basophils Absolute: 0.1 10*3/uL (ref 0.0–0.1)
Basophils Relative: 1 %
Eosinophils Absolute: 0.2 10*3/uL (ref 0.0–0.5)
Eosinophils Relative: 3 %
HCT: 31.9 % — ABNORMAL LOW (ref 36.0–46.0)
Hemoglobin: 10.9 g/dL — ABNORMAL LOW (ref 12.0–15.0)
Immature Granulocytes: 0 %
Lymphocytes Relative: 25 %
Lymphs Abs: 1.6 10*3/uL (ref 0.7–4.0)
MCH: 35.9 pg — ABNORMAL HIGH (ref 26.0–34.0)
MCHC: 34.2 g/dL (ref 30.0–36.0)
MCV: 104.9 fL — ABNORMAL HIGH (ref 80.0–100.0)
Monocytes Absolute: 1.1 10*3/uL — ABNORMAL HIGH (ref 0.1–1.0)
Monocytes Relative: 18 %
Neutro Abs: 3.3 10*3/uL (ref 1.7–7.7)
Neutrophils Relative %: 53 %
Platelet Count: 266 10*3/uL (ref 150–400)
RBC: 3.04 MIL/uL — ABNORMAL LOW (ref 3.87–5.11)
RDW: 14.2 % (ref 11.5–15.5)
WBC Count: 6.3 10*3/uL (ref 4.0–10.5)
nRBC: 0 % (ref 0.0–0.2)

## 2022-02-06 LAB — TSH: TSH: 1.059 u[IU]/mL (ref 0.350–4.500)

## 2022-02-06 MED ORDER — HEPARIN SOD (PORK) LOCK FLUSH 100 UNIT/ML IV SOLN
500.0000 [IU] | Freq: Once | INTRAVENOUS | Status: AC | PRN
  Administered 2022-02-06: 500 [IU]

## 2022-02-06 MED ORDER — SODIUM CHLORIDE 0.9% FLUSH
10.0000 mL | INTRAVENOUS | Status: DC | PRN
  Administered 2022-02-06: 10 mL

## 2022-02-06 MED ORDER — SODIUM CHLORIDE 0.9 % IV SOLN: Freq: Once | INTRAVENOUS | Status: AC

## 2022-02-06 MED ORDER — SODIUM CHLORIDE 0.9 % IV SOLN
200.0000 mg | Freq: Once | INTRAVENOUS | Status: AC
  Administered 2022-02-06: 200 mg via INTRAVENOUS
  Filled 2022-02-06: qty 200

## 2022-02-06 MED ORDER — PROCHLORPERAZINE MALEATE 10 MG PO TABS
10.0000 mg | ORAL_TABLET | Freq: Once | ORAL | Status: AC
  Administered 2022-02-06: 10 mg via ORAL
  Filled 2022-02-06: qty 1

## 2022-02-06 MED ORDER — SODIUM CHLORIDE 0.9 % IV SOLN
500.0000 mg/m2 | Freq: Once | INTRAVENOUS | Status: AC
  Administered 2022-02-06: 700 mg via INTRAVENOUS
  Filled 2022-02-06: qty 20

## 2022-02-06 NOTE — Progress Notes (Signed)
[place] Telephone:([phone]) [phone]   Fax:(336) [phone]  OFFICE PROGRESS NOTE  [name], FNP [address] [place] [address]  DIAGNOSIS: Stage IV (T2b, N2, M1 C) non-small cell lung cancer, adenocarcinoma presented with right hilar mass in addition to mediastinal and upper abdominal lymphadenopathy in addition to bilateral adrenal metastases and metastatic disease in the musculature of the lower back.  This was diagnosed in April 2023.  DETECTED ALTERATION(S) / BIOMARKER(S) % CFDNA OR AMPLIFICATION ASSOCIATED FDA-APPROVED THERAPIES CLINICAL TRIAL AVAILABILITY [id number] 0.5% None  Yes [id number] (Splice Site Indel) 0.8% None  Yes  PD-L1 expression 0%  PRIOR THERAPY: Palliative radiotherapy to the right hilar region under the care of Dr. Lisbeth Renshaw.  CURRENT THERAPY:  systemic chemotherapy with carboplatin for AUC of 5, Alimta 500 Mg/M2 and Keytruda 200 Mg IV every 3 weeks.  First dose August 29, 2021.  Status post 6 cycles.  Starting from cycle #5 the patient is on maintenance treatment with Alimta and Keytruda every 3 weeks.  INTERVAL HISTORY: April Walsh 56 y.o. female returns to the clinic today for follow-up visit accompanied by family member.  The patient is feeling fine today with no concerning complaints.  She gained few pounds since her last visit.  She denied having any chest pain, shortness of breath, cough or hemoptysis.  She has no nausea, vomiting, diarrhea or constipation.  She has no headache or visual changes.  She had repeat CT scan of the chest, abdomen and pelvis performed recently and she is here for evaluation and discussion of her scan results.  MEDICAL HISTORY: Past Medical History:  Diagnosis Date   Anemia    Asthma    Bronchitis    Cancer ([place])    basal cell melanoma, cervical cancer   COPD (chronic obstructive pulmonary disease) ([place])    "early stages of COPD"   Headache    Lung cancer ([place]) 08/13/2021     ALLERGIES:  has no allergies on file.  MEDICATIONS:  Current Outpatient Medications  Medication Sig Dispense Refill   apixaban (ELIQUIS) 2.5 MG TABS tablet Take 1 tablet (2.5 mg total) by mouth 2 (two) times daily. 60 tablet 0   atorvastatin (LIPITOR) 40 MG tablet Take 1 tablet (40 mg total) by mouth daily. (Patient not taking: Reported on 10/04/2021) 30 tablet 0   Budeson-Glycopyrrol-Formoterol (BREZTRI AEROSPHERE) 160-9-4.8 MCG/ACT AERO Inhale 2 puffs into the lungs in the morning and at bedtime. 67.6 g 11   folic acid (FOLVITE) 1 MG tablet TAKE 1 TABLET BY MOUTH EVERY DAY 30 tablet 2   lidocaine-prilocaine (EMLA) cream Apply 1 Application topically as needed. 30 g 2   prochlorperazine (COMPAZINE) 10 MG tablet Take 1 tablet (10 mg total) by mouth every 6 (six) hours as needed. 30 tablet 2   triamcinolone cream (KENALOG) 0.1 % Apply 1 application  topically 2 (two) times daily as needed. 80 g 0   No current facility-administered medications for this visit.   Facility-Administered Medications Ordered in Other Visits  Medication Dose Route Frequency Provider Last Rate Last Admin   cyanocobalamin (VITAMIN B12) 1000 MCG/ML injection            prochlorperazine (COMPAZINE) 10 MG tablet             SURGICAL HISTORY:  Past Surgical History:  Procedure Laterality Date   APPENDECTOMY     1996   BREAST BIOPSY Right    2010-2015   BRONCHIAL BIOPSY  08/13/2021  Procedure: BRONCHIAL BIOPSIES;  Surgeon: Collene Gobble, MD;  Location: [facility] ENDOSCOPY;  Service: Pulmonary;;   BRONCHIAL BRUSHINGS  08/13/2021   Procedure: BRONCHIAL BRUSHINGS;  Surgeon: Collene Gobble, MD;  Location: [facility] ENDOSCOPY;  Service: Pulmonary;;   BRONCHIAL NEEDLE ASPIRATION BIOPSY  08/13/2021   Procedure: BRONCHIAL NEEDLE ASPIRATION BIOPSIES;  Surgeon: Collene Gobble, MD;  Location: [facility] ENDOSCOPY;  Service: Pulmonary;;   CESAREAN SECTION     1984, 1987   FOOT SURGERY Right    2 pins and screw 1999   FOOT SURGERY Left  06/21/2021   screw and 2 pins   HEMOSTASIS CONTROL  08/13/2021   Procedure: HEMOSTASIS CONTROL;  Surgeon: Collene Gobble, MD;  Location: [facility] ENDOSCOPY;  Service: Pulmonary;;   IR IMAGING GUIDED PORT INSERTION  11/26/2021   MELANOMA EXCISION Left    basal cell melanoma 2011   [place]   VIDEO BRONCHOSCOPY  08/13/2021   Procedure: VIDEO BRONCHOSCOPY WITHOUT FLUORO;  Surgeon: Collene Gobble, MD;  Location: [facility] ENDOSCOPY;  Service: Pulmonary;;   VIDEO BRONCHOSCOPY WITH ENDOBRONCHIAL ULTRASOUND N/A 08/13/2021   Procedure: VIDEO BRONCHOSCOPY WITH ENDOBRONCHIAL ULTRASOUND;  Surgeon: Collene Gobble, MD;  Location: [facility] ENDOSCOPY;  Service: Pulmonary;  Laterality: N/A;    REVIEW OF SYSTEMS:  Constitutional: negative Eyes: negative Ears, nose, mouth, throat, and face: negative Respiratory: negative Cardiovascular: negative Gastrointestinal: negative Genitourinary:negative Integument/breast: negative Hematologic/lymphatic: negative Musculoskeletal:negative Neurological: negative Behavioral/Psych: negative Endocrine: negative Allergic/Immunologic: negative   PHYSICAL EXAMINATION: General appearance: alert, cooperative, and no distress Head: Normocephalic, without obvious abnormality, atraumatic Neck: no adenopathy, no JVD, supple, symmetrical, trachea midline, and thyroid not enlarged, symmetric, no tenderness/mass/nodules Lymph nodes: Cervical, supraclavicular, and axillary nodes normal. Resp: clear to auscultation bilaterally Back: symmetric, no curvature. ROM normal. No CVA tenderness. Cardio: regular rate and rhythm, S1, S2 normal, no murmur, click, rub or gallop GI: soft, non-tender; bowel sounds normal; no masses,  no organomegaly Extremities: extremities normal, atraumatic, no cyanosis or edema Neurologic: Alert and oriented X 3, normal strength and tone. Normal symmetric reflexes. Normal coordination and gait  ECOG PERFORMANCE STATUS: 1 -  Symptomatic but completely ambulatory  Blood pressure 114/65, pulse 90, temperature 98.2 F (36.8 C), temperature source Oral, resp. rate 15, weight 105 lb 11.2 oz (47.9 kg), SpO2 98 %.  LABORATORY DATA: Lab Results  Component Value Date   WBC 5.6 01/16/2022   HGB 10.9 (L) 01/16/2022   HCT 31.9 (L) 01/16/2022   MCV 105.3 (H) 01/16/2022   PLT 203 01/16/2022      Chemistry      Component Value Date/Time   NA 139 01/16/2022 0925   K 4.1 01/16/2022 0925   CL 106 01/16/2022 0925   CO2 27 01/16/2022 0925   BUN 16 01/16/2022 0925   CREATININE 0.63 01/16/2022 0925      Component Value Date/Time   CALCIUM 9.5 01/16/2022 0925   ALKPHOS 77 01/16/2022 0925   AST 19 01/16/2022 0925   ALT 9 01/16/2022 0925   BILITOT 0.3 01/16/2022 0925       RADIOGRAPHIC STUDIES: CT Chest W Contrast  Result Date: 02/04/2022 CLINICAL DATA:  Non-small cell lung cancer, staging. * Tracking Code: BO * EXAM: CT CHEST, ABDOMEN, AND PELVIS WITH CONTRAST TECHNIQUE: Multidetector CT imaging of the chest, abdomen and pelvis was performed following the standard protocol during bolus administration of intravenous contrast. RADIATION DOSE REDUCTION: This exam was performed according to  the departmental dose-optimization program which includes automated exposure control, adjustment of the mA and/or kV according to patient size and/or use of iterative reconstruction technique. CONTRAST:  162m OMNIPAQUE IOHEXOL 300 MG/ML  SOLN COMPARISON:  11/29/2021. FINDINGS: CT CHEST FINDINGS Cardiovascular: Right IJ Port-A-Cath terminates at the SVC RA junction. Atherosclerotic calcification of the aorta. Heart size normal. No pericardial effusion. Mediastinum/Nodes: No pathologically enlarged mediastinal, hilar or axillary lymph nodes. Slight soft tissue thickening in the right hilum, as before. Old left rib fractures. Esophagus is grossly unremarkable. Lungs/Pleura: Right apical pleuroparenchymal scarring. Volume loss in the perihilar  right upper lobe with additional scattered subsegmental volume loss bilaterally. Findings are unchanged from 11/29/2021. Image quality is degraded by respiratory motion. New patchy peribronchovascular ground-glass in the right lower lobe. Mild paraseptal emphysema. 4 mm medial left lower lobe nodule (506/105), not well seen on 11/29/2021. No pleural fluid. Adherent debris in the bronchus intermedius. Airway is otherwise unremarkable. Musculoskeletal: Old left rib fractures. No worrisome lytic or sclerotic lesions. Degenerative changes in the spine. CT ABDOMEN PELVIS FINDINGS Hepatobiliary: Liver and gallbladder are unremarkable. No biliary ductal dilatation. Pancreas: Negative. Spleen: Negative. Adrenals/Urinary Tract: Adrenal glands are unremarkable. Scarring in the right kidney. 3.2 cm cyst in the lower pole left kidney. No follow-up necessary. Ureters are decompressed. Bladder is grossly unremarkable. Stomach/Bowel: Stomach is relatively decompressed. Stomach, small bowel and colon are otherwise unremarkable. Appendectomy. Vascular/Lymphatic: Atherosclerotic calcification of the aorta. No pathologically enlarged lymph nodes. Reproductive: Hysterectomy.  No adnexal mass. Other: No free fluid.  Mesenteries and peritoneum are unremarkable. Musculoskeletal: Degenerative changes in the spine. No worrisome lytic or sclerotic lesions. IMPRESSION: 1. Post treatment soft tissue thickening and volume loss in the right perihilar region without a discrete nodule or mass, as on 11/29/2021. No evidence of metastatic disease. 2. 4 mm medial left lower lobe nodule, not well seen on 11/29/2021. Recommend attention on follow-up. 3. Mild peribronchovascular ground-glass in the right lower lobe, new from 11/29/2021 and likely infectious/inflammatory in etiology. 4.  Aortic atherosclerosis (ICD10-I70.0). 5.  Emphysema (ICD10-J43.9). Electronically Signed   By: MLorin PicketM.D.   On: 02/04/2022 11:27   CT Abdomen Pelvis W  Contrast  Result Date: 02/04/2022 CLINICAL DATA:  Non-small cell lung cancer, staging. * Tracking Code: BO * EXAM: CT CHEST, ABDOMEN, AND PELVIS WITH CONTRAST TECHNIQUE: Multidetector CT imaging of the chest, abdomen and pelvis was performed following the standard protocol during bolus administration of intravenous contrast. RADIATION DOSE REDUCTION: This exam was performed according to the departmental dose-optimization program which includes automated exposure control, adjustment of the mA and/or kV according to patient size and/or use of iterative reconstruction technique. CONTRAST:  1028mOMNIPAQUE IOHEXOL 300 MG/ML  SOLN COMPARISON:  11/29/2021. FINDINGS: CT CHEST FINDINGS Cardiovascular: Right IJ Port-A-Cath terminates at the SVC RA junction. Atherosclerotic calcification of the aorta. Heart size normal. No pericardial effusion. Mediastinum/Nodes: No pathologically enlarged mediastinal, hilar or axillary lymph nodes. Slight soft tissue thickening in the right hilum, as before. Old left rib fractures. Esophagus is grossly unremarkable. Lungs/Pleura: Right apical pleuroparenchymal scarring. Volume loss in the perihilar right upper lobe with additional scattered subsegmental volume loss bilaterally. Findings are unchanged from 11/29/2021. Image quality is degraded by respiratory motion. New patchy peribronchovascular ground-glass in the right lower lobe. Mild paraseptal emphysema. 4 mm medial left lower lobe nodule (506/105), not well seen on 11/29/2021. No pleural fluid. Adherent debris in the bronchus intermedius. Airway is otherwise unremarkable. Musculoskeletal: Old left rib fractures. No worrisome lytic or sclerotic lesions. Degenerative  changes in the spine. CT ABDOMEN PELVIS FINDINGS Hepatobiliary: Liver and gallbladder are unremarkable. No biliary ductal dilatation. Pancreas: Negative. Spleen: Negative. Adrenals/Urinary Tract: Adrenal glands are unremarkable. Scarring in the right kidney. 3.2 cm cyst in  the lower pole left kidney. No follow-up necessary. Ureters are decompressed. Bladder is grossly unremarkable. Stomach/Bowel: Stomach is relatively decompressed. Stomach, small bowel and colon are otherwise unremarkable. Appendectomy. Vascular/Lymphatic: Atherosclerotic calcification of the aorta. No pathologically enlarged lymph nodes. Reproductive: Hysterectomy.  No adnexal mass. Other: No free fluid.  Mesenteries and peritoneum are unremarkable. Musculoskeletal: Degenerative changes in the spine. No worrisome lytic or sclerotic lesions. IMPRESSION: 1. Post treatment soft tissue thickening and volume loss in the right perihilar region without a discrete nodule or mass, as on 11/29/2021. No evidence of metastatic disease. 2. 4 mm medial left lower lobe nodule, not well seen on 11/29/2021. Recommend attention on follow-up. 3. Mild peribronchovascular ground-glass in the right lower lobe, new from 11/29/2021 and likely infectious/inflammatory in etiology. 4.  Aortic atherosclerosis (ICD10-I70.0). 5.  Emphysema (ICD10-J43.9). Electronically Signed   By: Lorin Picket M.D.   On: 02/04/2022 11:27    ASSESSMENT AND PLAN: This is a very pleasant 56 years old white female with Stage IV (T2b, N2, M1 C) non-small cell lung cancer, adenocarcinoma presented with right hilar mass in addition to mediastinal and upper abdominal lymphadenopathy in addition to bilateral adrenal metastases and metastatic disease in the musculature of the lower back.  This was diagnosed in April 2023. The molecular studies by [email or web address] showed no actionable mutations and the patient has negative PD-L1 expression. She is currently undergoing palliative radiotherapy to the right hilar region under the care of Dr. Lisbeth Renshaw. The patient is currently on systemic chemotherapy started with carboplatin for AUC of 5, Alimta 500 Mg/M2 and Keytruda 200 Mg IV every 3 weeks and starting from cycle #5 she is on maintenance treatment with Alimta and Keytruda  every 3 weeks.  Status post a total of 7 cycles. The patient has been tolerating this treatment well with no concerning adverse effects. She had repeat CT scan of the chest, abdomen and pelvis performed recently.  I personally and independently reviewed the scan and discussed the result with the patient today. Her scan showed no concerning findings for disease progression. I recommended for the patient to continue her current treatment with maintenance Alimta and Keytruda and she will proceed with cycle #8 today. I will see the patient back for follow-up visit in 3 weeks for evaluation before starting cycle #9. The patient was advised to call immediately if she has any other concerning symptoms in the interval. The patient voices understanding of current disease status and treatment options and is in agreement with the current care plan.  All questions were answered. The patient knows to call the clinic with any problems, questions or concerns. We can certainly see the patient much sooner if necessary.  The total time spent in the appointment was 30 minutes.  Disclaimer: This note was dictated with voice recognition software. Similar sounding words can inadvertently be transcribed and may not be corrected upon review.

## 2022-02-06 NOTE — Patient Instructions (Signed)
[facility] ONCOLOGY  Discharge Instructions: Thank you for choosing [facility] to provide your oncology and hematology care.   If you have a lab appointment with the [facility], please go directly to the [facility] and check in at the registration area.   Wear comfortable clothing and clothing appropriate for easy access to any Portacath or PICC line.   We strive to give you quality time with your provider. You may need to reschedule your appointment if you arrive late (15 or more minutes).  Arriving late affects you and other patients whose appointments are after yours.  Also, if you miss three or more appointments without notifying the office, you may be dismissed from the clinic at the provider's discretion.      For prescription refill requests, have your pharmacy contact our office and allow 72 hours for refills to be completed.    Today you received the following chemotherapy and/or immunotherapy agents keytruda, alimta      To help prevent nausea and vomiting after your treatment, we encourage you to take your nausea medication as directed.  BELOW ARE SYMPTOMS THAT SHOULD BE REPORTED IMMEDIATELY: *FEVER GREATER THAN 100.4 F (38 C) OR HIGHER *CHILLS OR SWEATING *NAUSEA AND VOMITING THAT IS NOT CONTROLLED WITH YOUR NAUSEA MEDICATION *UNUSUAL SHORTNESS OF BREATH *UNUSUAL BRUISING OR BLEEDING *URINARY PROBLEMS (pain or burning when urinating, or frequent urination) *BOWEL PROBLEMS (unusual diarrhea, constipation, pain near the anus) TENDERNESS IN MOUTH AND THROAT WITH OR WITHOUT PRESENCE OF ULCERS (sore throat, sores in mouth, or a toothache) UNUSUAL RASH, SWELLING OR PAIN  UNUSUAL VAGINAL DISCHARGE OR ITCHING   Items with * indicate a potential emergency and should be followed up as soon as possible or go to the Emergency Department if any problems should occur.  Please show the CHEMOTHERAPY ALERT CARD or IMMUNOTHERAPY ALERT CARD at  check-in to the Emergency Department and triage nurse.  Should you have questions after your visit or need to cancel or reschedule your appointment, please contact [facility]  Dept: [phone]  and follow the prompts.  Office hours are 8:00 a.m. to 4:30 p.m. Monday - Friday. Please note that voicemails left after 4:00 p.m. may not be returned until the following business day.  We are closed weekends and major holidays. You have access to a nurse at all times for urgent questions. Please call the main number to the clinic Dept: [phone] and follow the prompts.   For any non-urgent questions, you may also contact your provider using MyChart. We now offer e-Visits for anyone 65 and older to request care online for non-urgent symptoms. For details visit [email or web address].   Also download the MyChart app! Go to the app store, search "MyChart", open the app, select [facility], and log in with your MyChart username and password.  Masks are optional in the cancer centers. If you would like for your care team to wear a mask while they are taking care of you, please let them know. You may have one support person who is at least 56 years old accompany you for your appointments.

## 2022-02-13 ENCOUNTER — Other Ambulatory Visit: Payer: BC Managed Care – PPO

## 2022-02-24 NOTE — Progress Notes (Unsigned)
[facility] OFFICE PROGRESS NOTE  April Gaskins, FNP [address] [place] [address]  DIAGNOSIS:  Stage IV (T2b, N2, M1 C) non-small cell lung cancer, adenocarcinoma presented with right hilar mass in addition to mediastinal and upper abdominal lymphadenopathy in addition to bilateral adrenal metastases and metastatic disease in the musculature of the lower back.  This was diagnosed in April 2023.   DETECTED ALTERATION(S) / BIOMARKER(S)     % CFDNA OR AMPLIFICATION        ASSOCIATED FDA-APPROVED THERAPIES         CLINICAL TRIAL AVAILABILITY [id number] 0.5% None     Yes [id number] (Splice Site Indel) 2.9% None     Yes   PD-L1 expression 0%  PRIOR THERAPY:  Palliative radiotherapy to the right hilar region under the care of Dr. Lisbeth Renshaw.   CURRENT THERAPY: Systemic chemotherapy with carboplatin for AUC of 5, Alimta 500 Mg/M2 and Keytruda 200 Mg IV every 3 weeks.  First dose August 29, 2021. Status post 8 cycles.  Starting from cycle #5, the patient started maintenance Alimta and Keytruda.  INTERVAL HISTORY: April Walsh 56 y.o. female returns to the clinic today for a follow-up visit accompanied by her Aunt.  The patient is feeling fairly well today without any concerning complaints.  The patient is currently undergoing chemotherapy and immunotherapy.  She denies any fever, chills, or night sweats.  She gained *** lbs. She was previously seen by member the nutritionist team.  She reports improvement in her shortness of breath.  She is seen by Dr. Lamonte Sakai from pulmonology.  She denies any chest pain or hemoptysis.  Denies any nausea, vomiting, diarrhea, or constipation.  Denies any headache or visual changes.  She denies any rashes or skin changes.  She is here today for evaluation and repeat blood work before starting cycle #9.     MEDICAL HISTORY: Past Medical History:  Diagnosis Date   Anemia    Asthma    Bronchitis    Cancer ([place])    basal cell  melanoma, cervical cancer   COPD (chronic obstructive pulmonary disease) ([place])    "early stages of COPD"   Headache    Lung cancer ([place]) 08/13/2021    ALLERGIES:  has No Known Allergies.  MEDICATIONS:  Current Outpatient Medications  Medication Sig Dispense Refill   apixaban (ELIQUIS) 2.5 MG TABS tablet Take 1 tablet (2.5 mg total) by mouth 2 (two) times daily. 60 tablet 0   Budeson-Glycopyrrol-Formoterol (BREZTRI AEROSPHERE) 160-9-4.8 MCG/ACT AERO Inhale 2 puffs into the lungs in the morning and at bedtime. 79.8 g 11   folic acid (FOLVITE) 1 MG tablet TAKE 1 TABLET BY MOUTH EVERY DAY 30 tablet 2   lidocaine-prilocaine (EMLA) cream Apply 1 Application topically as needed. 30 g 2   prochlorperazine (COMPAZINE) 10 MG tablet Take 1 tablet (10 mg total) by mouth every 6 (six) hours as needed. 30 tablet 2   triamcinolone cream (KENALOG) 0.1 % Apply 1 application  topically 2 (two) times daily as needed. 80 g 0   No current facility-administered medications for this visit.   Facility-Administered Medications Ordered in Other Visits  Medication Dose Route Frequency Provider Last Rate Last Admin   cyanocobalamin (VITAMIN B12) 1000 MCG/ML injection            prochlorperazine (COMPAZINE) 10 MG tablet             SURGICAL HISTORY:  Past Surgical History:  Procedure Laterality Date  APPENDECTOMY     1996   BREAST BIOPSY Right    2010-2015   BRONCHIAL BIOPSY  08/13/2021   Procedure: BRONCHIAL BIOPSIES;  Surgeon: Collene Gobble, MD;  Location: [facility] ENDOSCOPY;  Service: Pulmonary;;   BRONCHIAL BRUSHINGS  08/13/2021   Procedure: BRONCHIAL BRUSHINGS;  Surgeon: Collene Gobble, MD;  Location: [facility] ENDOSCOPY;  Service: Pulmonary;;   BRONCHIAL NEEDLE ASPIRATION BIOPSY  08/13/2021   Procedure: BRONCHIAL NEEDLE ASPIRATION BIOPSIES;  Surgeon: Collene Gobble, MD;  Location: [facility] ENDOSCOPY;  Service: Pulmonary;;   CESAREAN [place] Right    2 pins and screw 1999   FOOT SURGERY  Left 06/21/2021   screw and 2 pins   HEMOSTASIS CONTROL  08/13/2021   Procedure: HEMOSTASIS CONTROL;  Surgeon: Collene Gobble, MD;  Location: [facility] ENDOSCOPY;  Service: Pulmonary;;   IR IMAGING GUIDED PORT INSERTION  11/26/2021   MELANOMA EXCISION Left    basal cell melanoma 2011   [place]   VIDEO BRONCHOSCOPY  08/13/2021   Procedure: VIDEO BRONCHOSCOPY WITHOUT FLUORO;  Surgeon: Collene Gobble, MD;  Location: [facility] ENDOSCOPY;  Service: Pulmonary;;   VIDEO BRONCHOSCOPY WITH ENDOBRONCHIAL ULTRASOUND N/A 08/13/2021   Procedure: VIDEO BRONCHOSCOPY WITH ENDOBRONCHIAL ULTRASOUND;  Surgeon: Collene Gobble, MD;  Location: [facility] ENDOSCOPY;  Service: Pulmonary;  Laterality: N/A;    REVIEW OF SYSTEMS:   Review of Systems  Constitutional: Negative for appetite change, chills, fatigue, fever and unexpected weight change.  HENT:   Negative for mouth sores, nosebleeds, sore throat and trouble swallowing.   Eyes: Negative for eye problems and icterus.  Respiratory: Negative for cough, hemoptysis, shortness of breath and wheezing.   Cardiovascular: Negative for chest pain and leg swelling.  Gastrointestinal: Negative for abdominal pain, constipation, diarrhea, nausea and vomiting.  Genitourinary: Negative for bladder incontinence, difficulty urinating, dysuria, frequency and hematuria.   Musculoskeletal: Negative for back pain, gait problem, neck pain and neck stiffness.  Skin: Negative for itching and rash.  Neurological: Negative for dizziness, extremity weakness, gait problem, headaches, light-headedness and seizures.  Hematological: Negative for adenopathy. Does not bruise/bleed easily.  Psychiatric/Behavioral: Negative for confusion, depression and sleep disturbance. The patient is not nervous/anxious.     PHYSICAL EXAMINATION:  There were no vitals taken for this visit.  ECOG PERFORMANCE STATUS: {CHL ONC ECOG [id number]  Physical Exam   Constitutional: Oriented to person, place, and time and well-developed, well-nourished, and in no distress. No distress.  HENT:  Head: Normocephalic and atraumatic.  Mouth/Throat: Oropharynx is clear and moist. No oropharyngeal exudate.  Eyes: Conjunctivae are normal. Right eye exhibits no discharge. Left eye exhibits no discharge. No scleral icterus.  Neck: Normal range of motion. Neck supple.  Cardiovascular: Normal rate, regular rhythm, normal heart sounds and intact distal pulses.   Pulmonary/Chest: Effort normal and breath sounds normal. No respiratory distress. No wheezes. No rales.  Abdominal: Soft. Bowel sounds are normal. Exhibits no distension and no mass. There is no tenderness.  Musculoskeletal: Normal range of motion. Exhibits no edema.  Lymphadenopathy:    No cervical adenopathy.  Neurological: Alert and oriented to person, place, and time. Exhibits normal muscle tone. Gait normal. Coordination normal.  Skin: Skin is warm and dry. No rash noted. Not diaphoretic. No erythema. No pallor.  Psychiatric: Mood, memory and judgment normal.  Vitals reviewed.  LABORATORY DATA: Lab Results  Component Value Date  WBC 6.3 02/06/2022   HGB 10.9 (L) 02/06/2022   HCT 31.9 (L) 02/06/2022   MCV 104.9 (H) 02/06/2022   PLT 266 02/06/2022      Chemistry      Component Value Date/Time   NA 136 02/06/2022 1005   K 4.5 02/06/2022 1005   CL 107 02/06/2022 1005   CO2 26 02/06/2022 1005   BUN 14 02/06/2022 1005   CREATININE 0.53 02/06/2022 1005      Component Value Date/Time   CALCIUM 8.9 02/06/2022 1005   ALKPHOS 80 02/06/2022 1005   AST 22 02/06/2022 1005   ALT 14 02/06/2022 1005   BILITOT 0.2 (L) 02/06/2022 1005       RADIOGRAPHIC STUDIES:  CT Chest W Contrast  Result Date: 02/04/2022 CLINICAL DATA:  Non-small cell lung cancer, staging. * Tracking Code: BO * EXAM: CT CHEST, ABDOMEN, AND PELVIS WITH CONTRAST TECHNIQUE: Multidetector CT imaging of the chest, abdomen and  pelvis was performed following the standard protocol during bolus administration of intravenous contrast. RADIATION DOSE REDUCTION: This exam was performed according to the departmental dose-optimization program which includes automated exposure control, adjustment of the mA and/or kV according to patient size and/or use of iterative reconstruction technique. CONTRAST:  150m OMNIPAQUE IOHEXOL 300 MG/ML  SOLN COMPARISON:  11/29/2021. FINDINGS: CT CHEST FINDINGS Cardiovascular: Right IJ Port-A-Cath terminates at the SVC RA junction. Atherosclerotic calcification of the aorta. Heart size normal. No pericardial effusion. Mediastinum/Nodes: No pathologically enlarged mediastinal, hilar or axillary lymph nodes. Slight soft tissue thickening in the right hilum, as before. Old left rib fractures. Esophagus is grossly unremarkable. Lungs/Pleura: Right apical pleuroparenchymal scarring. Volume loss in the perihilar right upper lobe with additional scattered subsegmental volume loss bilaterally. Findings are unchanged from 11/29/2021. Image quality is degraded by respiratory motion. New patchy peribronchovascular ground-glass in the right lower lobe. Mild paraseptal emphysema. 4 mm medial left lower lobe nodule (506/105), not well seen on 11/29/2021. No pleural fluid. Adherent debris in the bronchus intermedius. Airway is otherwise unremarkable. Musculoskeletal: Old left rib fractures. No worrisome lytic or sclerotic lesions. Degenerative changes in the spine. CT ABDOMEN PELVIS FINDINGS Hepatobiliary: Liver and gallbladder are unremarkable. No biliary ductal dilatation. Pancreas: Negative. Spleen: Negative. Adrenals/Urinary Tract: Adrenal glands are unremarkable. Scarring in the right kidney. 3.2 cm cyst in the lower pole left kidney. No follow-up necessary. Ureters are decompressed. Bladder is grossly unremarkable. Stomach/Bowel: Stomach is relatively decompressed. Stomach, small bowel and colon are otherwise unremarkable.  Appendectomy. Vascular/Lymphatic: Atherosclerotic calcification of the aorta. No pathologically enlarged lymph nodes. Reproductive: Hysterectomy.  No adnexal mass. Other: No free fluid.  Mesenteries and peritoneum are unremarkable. Musculoskeletal: Degenerative changes in the spine. No worrisome lytic or sclerotic lesions. IMPRESSION: 1. Post treatment soft tissue thickening and volume loss in the right perihilar region without a discrete nodule or mass, as on 11/29/2021. No evidence of metastatic disease. 2. 4 mm medial left lower lobe nodule, not well seen on 11/29/2021. Recommend attention on follow-up. 3. Mild peribronchovascular ground-glass in the right lower lobe, new from 11/29/2021 and likely infectious/inflammatory in etiology. 4.  Aortic atherosclerosis (ICD10-I70.0). 5.  Emphysema (ICD10-J43.9). Electronically Signed   By: MLorin PicketM.D.   On: 02/04/2022 11:27   CT Abdomen Pelvis W Contrast  Result Date: 02/04/2022 CLINICAL DATA:  Non-small cell lung cancer, staging. * Tracking Code: BO * EXAM: CT CHEST, ABDOMEN, AND PELVIS WITH CONTRAST TECHNIQUE: Multidetector CT imaging of the chest, abdomen and pelvis was performed following the standard protocol during bolus administration  of intravenous contrast. RADIATION DOSE REDUCTION: This exam was performed according to the departmental dose-optimization program which includes automated exposure control, adjustment of the mA and/or kV according to patient size and/or use of iterative reconstruction technique. CONTRAST:  134m OMNIPAQUE IOHEXOL 300 MG/ML  SOLN COMPARISON:  11/29/2021. FINDINGS: CT CHEST FINDINGS Cardiovascular: Right IJ Port-A-Cath terminates at the SVC RA junction. Atherosclerotic calcification of the aorta. Heart size normal. No pericardial effusion. Mediastinum/Nodes: No pathologically enlarged mediastinal, hilar or axillary lymph nodes. Slight soft tissue thickening in the right hilum, as before. Old left rib fractures. Esophagus  is grossly unremarkable. Lungs/Pleura: Right apical pleuroparenchymal scarring. Volume loss in the perihilar right upper lobe with additional scattered subsegmental volume loss bilaterally. Findings are unchanged from 11/29/2021. Image quality is degraded by respiratory motion. New patchy peribronchovascular ground-glass in the right lower lobe. Mild paraseptal emphysema. 4 mm medial left lower lobe nodule (506/105), not well seen on 11/29/2021. No pleural fluid. Adherent debris in the bronchus intermedius. Airway is otherwise unremarkable. Musculoskeletal: Old left rib fractures. No worrisome lytic or sclerotic lesions. Degenerative changes in the spine. CT ABDOMEN PELVIS FINDINGS Hepatobiliary: Liver and gallbladder are unremarkable. No biliary ductal dilatation. Pancreas: Negative. Spleen: Negative. Adrenals/Urinary Tract: Adrenal glands are unremarkable. Scarring in the right kidney. 3.2 cm cyst in the lower pole left kidney. No follow-up necessary. Ureters are decompressed. Bladder is grossly unremarkable. Stomach/Bowel: Stomach is relatively decompressed. Stomach, small bowel and colon are otherwise unremarkable. Appendectomy. Vascular/Lymphatic: Atherosclerotic calcification of the aorta. No pathologically enlarged lymph nodes. Reproductive: Hysterectomy.  No adnexal mass. Other: No free fluid.  Mesenteries and peritoneum are unremarkable. Musculoskeletal: Degenerative changes in the spine. No worrisome lytic or sclerotic lesions. IMPRESSION: 1. Post treatment soft tissue thickening and volume loss in the right perihilar region without a discrete nodule or mass, as on 11/29/2021. No evidence of metastatic disease. 2. 4 mm medial left lower lobe nodule, not well seen on 11/29/2021. Recommend attention on follow-up. 3. Mild peribronchovascular ground-glass in the right lower lobe, new from 11/29/2021 and likely infectious/inflammatory in etiology. 4.  Aortic atherosclerosis (ICD10-I70.0). 5.  Emphysema  (ICD10-J43.9). Electronically Signed   By: MLorin PicketM.D.   On: 02/04/2022 11:27     ASSESSMENT/PLAN:  This is a very pleasant 56years old white female with Stage IV (T2b, N2, M1 C) non-small cell lung cancer, adenocarcinoma presented with right hilar mass in addition to mediastinal and upper abdominal lymphadenopathy in addition to bilateral adrenal metastases and metastatic disease in the musculature of the lower back.  This was diagnosed in April 2023.   The molecular studies by [email or web address] showed no actionable mutations and the patient has negative PD-L1 expression. She is currently undergoing palliative radiotherapy to the right hilar region under the care of Dr. MLisbeth Renshaw  She completed palliative radiation to the right hilar area under the care of Dr. MLisbeth Renshaw   She is currently undergoing palliative systemic chemotherapy with carboplatin for an AUC of 5, Alimta 500 mg per metered squared, Keytruda 200 mg IV every 3 weeks.  She is status post 8 cycles.  Her first dose was on 08/29/2021. Starting from cycle #5, she started maintenance alimta and [place]   Labs were reviewed. Recommend she *** with cycle #9 today as scheduled.   We will see the patient back for follow-up visit in 3 weeks for evaluation and repeat blood work before starting cycle #10.      She recently had a port placed. We will ensure that her labs  moving forward are labs/flush appointments.    The patient was advised to call immediately if she has any concerning symptoms in the interval. The patient voices understanding of current disease status and treatment options and is in agreement with the current care plan. All questions were answered. The patient knows to call the clinic with any problems, questions or concerns. We can certainly see the patient much sooner if necessary          No orders of the defined types were placed in this encounter.    I spent {CHL ONC TIME VISIT - YNWGN:[id number]}  counseling the patient face to face. The total time spent in the appointment was {CHL ONC TIME VISIT - QIONG:[id number]}.  Feliberto Stockley L Lezlie Ritchey, PA-C 02/24/22

## 2022-02-26 ENCOUNTER — Inpatient Hospital Stay: Payer: BC Managed Care – PPO | Attending: Radiation Oncology

## 2022-02-26 ENCOUNTER — Inpatient Hospital Stay: Payer: BC Managed Care – PPO

## 2022-02-26 ENCOUNTER — Other Ambulatory Visit: Payer: Self-pay

## 2022-02-26 ENCOUNTER — Inpatient Hospital Stay: Payer: BC Managed Care – PPO | Admitting: Physician Assistant

## 2022-02-26 VITALS — BP 98/66 | HR 78 | Resp 18

## 2022-02-26 DIAGNOSIS — Z7901 Long term (current) use of anticoagulants: Secondary | ICD-10-CM | POA: Diagnosis not present

## 2022-02-26 DIAGNOSIS — Z79899 Other long term (current) drug therapy: Secondary | ICD-10-CM | POA: Diagnosis not present

## 2022-02-26 DIAGNOSIS — C3491 Malignant neoplasm of unspecified part of right bronchus or lung: Secondary | ICD-10-CM

## 2022-02-26 DIAGNOSIS — I63511 Cerebral infarction due to unspecified occlusion or stenosis of right middle cerebral artery: Secondary | ICD-10-CM | POA: Insufficient documentation

## 2022-02-26 DIAGNOSIS — Z5111 Encounter for antineoplastic chemotherapy: Secondary | ICD-10-CM | POA: Insufficient documentation

## 2022-02-26 DIAGNOSIS — E538 Deficiency of other specified B group vitamins: Secondary | ICD-10-CM | POA: Insufficient documentation

## 2022-02-26 DIAGNOSIS — Z87891 Personal history of nicotine dependence: Secondary | ICD-10-CM | POA: Insufficient documentation

## 2022-02-26 DIAGNOSIS — Z5112 Encounter for antineoplastic immunotherapy: Secondary | ICD-10-CM | POA: Insufficient documentation

## 2022-02-26 DIAGNOSIS — Z923 Personal history of irradiation: Secondary | ICD-10-CM | POA: Diagnosis not present

## 2022-02-26 DIAGNOSIS — Z95828 Presence of other vascular implants and grafts: Secondary | ICD-10-CM

## 2022-02-26 LAB — CBC WITH DIFFERENTIAL (CANCER CENTER ONLY)
Abs Immature Granulocytes: 0.04 10*3/uL (ref 0.00–0.07)
Basophils Absolute: 0 10*3/uL (ref 0.0–0.1)
Basophils Relative: 1 %
Eosinophils Absolute: 0.2 10*3/uL (ref 0.0–0.5)
Eosinophils Relative: 3 %
HCT: 31.9 % — ABNORMAL LOW (ref 36.0–46.0)
Hemoglobin: 10.9 g/dL — ABNORMAL LOW (ref 12.0–15.0)
Immature Granulocytes: 1 %
Lymphocytes Relative: 23 %
Lymphs Abs: 1.4 10*3/uL (ref 0.7–4.0)
MCH: 35.3 pg — ABNORMAL HIGH (ref 26.0–34.0)
MCHC: 34.2 g/dL (ref 30.0–36.0)
MCV: 103.2 fL — ABNORMAL HIGH (ref 80.0–100.0)
Monocytes Absolute: 1.1 10*3/uL — ABNORMAL HIGH (ref 0.1–1.0)
Monocytes Relative: 18 %
Neutro Abs: 3.5 10*3/uL (ref 1.7–7.7)
Neutrophils Relative %: 54 %
Platelet Count: 261 10*3/uL (ref 150–400)
RBC: 3.09 MIL/uL — ABNORMAL LOW (ref 3.87–5.11)
RDW: 14.2 % (ref 11.5–15.5)
WBC Count: 6.3 10*3/uL (ref 4.0–10.5)
nRBC: 0 % (ref 0.0–0.2)

## 2022-02-26 LAB — CMP (CANCER CENTER ONLY)
ALT: 10 U/L (ref 0–44)
AST: 18 U/L (ref 15–41)
Albumin: 3.6 g/dL (ref 3.5–5.0)
Alkaline Phosphatase: 80 U/L (ref 38–126)
Anion gap: 6 (ref 5–15)
BUN: 13 mg/dL (ref 6–20)
CO2: 25 mmol/L (ref 22–32)
Calcium: 9.1 mg/dL (ref 8.9–10.3)
Chloride: 107 mmol/L (ref 98–111)
Creatinine: 0.56 mg/dL (ref 0.44–1.00)
GFR, Estimated: >60 mL/min (ref >60)
Glucose, Bld: 123 mg/dL — ABNORMAL HIGH (ref 70–99)
Potassium: 3.9 mmol/L (ref 3.5–5.1)
Sodium: 138 mmol/L (ref 135–145)
Total Bilirubin: 0.3 mg/dL (ref 0.3–1.2)
Total Protein: 6.3 g/dL — ABNORMAL LOW (ref 6.5–8.1)

## 2022-02-26 LAB — TSH: TSH: 1.055 u[IU]/mL (ref 0.350–4.500)

## 2022-02-26 MED ORDER — SODIUM CHLORIDE 0.9% FLUSH
10.0000 mL | INTRAVENOUS | Status: DC | PRN
  Administered 2022-02-26: 10 mL

## 2022-02-26 MED ORDER — PROCHLORPERAZINE MALEATE 10 MG PO TABS
10.0000 mg | ORAL_TABLET | Freq: Once | ORAL | Status: AC
  Administered 2022-02-26: 10 mg via ORAL
  Filled 2022-02-26: qty 1

## 2022-02-26 MED ORDER — SODIUM CHLORIDE 0.9 % IV SOLN
500.0000 mg/m2 | Freq: Once | INTRAVENOUS | Status: AC
  Administered 2022-02-26: 700 mg via INTRAVENOUS
  Filled 2022-02-26: qty 20

## 2022-02-26 MED ORDER — SODIUM CHLORIDE 0.9 % IV SOLN
200.0000 mg | Freq: Once | INTRAVENOUS | Status: AC
  Administered 2022-02-26: 200 mg via INTRAVENOUS
  Filled 2022-02-26: qty 200

## 2022-02-26 MED ORDER — HEPARIN SOD (PORK) LOCK FLUSH 100 UNIT/ML IV SOLN
500.0000 [IU] | Freq: Once | INTRAVENOUS | Status: AC | PRN
  Administered 2022-02-26: 500 [IU]

## 2022-02-26 MED ORDER — SODIUM CHLORIDE 0.9% FLUSH
10.0000 mL | INTRAVENOUS | Status: AC | PRN
  Administered 2022-02-26: 10 mL

## 2022-02-26 MED ORDER — SODIUM CHLORIDE 0.9 % IV SOLN: Freq: Once | INTRAVENOUS | Status: AC

## 2022-02-26 MED ORDER — CYANOCOBALAMIN 1000 MCG/ML IJ SOLN
1000.0000 ug | Freq: Once | INTRAMUSCULAR | Status: AC
  Administered 2022-02-26: 1000 ug via INTRAMUSCULAR
  Filled 2022-02-26: qty 1

## 2022-02-26 NOTE — Patient Instructions (Signed)
[facility] ONCOLOGY  Discharge Instructions: Thank you for choosing [facility] to provide your oncology and hematology care.   If you have a lab appointment with the [facility], please go directly to the [facility] and check in at the registration area.   Wear comfortable clothing and clothing appropriate for easy access to any Portacath or PICC line.   We strive to give you quality time with your provider. You may need to reschedule your appointment if you arrive late (15 or more minutes).  Arriving late affects you and other patients whose appointments are after yours.  Also, if you miss three or more appointments without notifying the office, you may be dismissed from the clinic at the provider's discretion.      For prescription refill requests, have your pharmacy contact our office and allow 72 hours for refills to be completed.    Today you received the following chemotherapy and/or immunotherapy agents: Keytruda/Alimta      To help prevent nausea and vomiting after your treatment, we encourage you to take your nausea medication as directed.  BELOW ARE SYMPTOMS THAT SHOULD BE REPORTED IMMEDIATELY: *FEVER GREATER THAN 100.4 F (38 C) OR HIGHER *CHILLS OR SWEATING *NAUSEA AND VOMITING THAT IS NOT CONTROLLED WITH YOUR NAUSEA MEDICATION *UNUSUAL SHORTNESS OF BREATH *UNUSUAL BRUISING OR BLEEDING *URINARY PROBLEMS (pain or burning when urinating, or frequent urination) *BOWEL PROBLEMS (unusual diarrhea, constipation, pain near the anus) TENDERNESS IN MOUTH AND THROAT WITH OR WITHOUT PRESENCE OF ULCERS (sore throat, sores in mouth, or a toothache) UNUSUAL RASH, SWELLING OR PAIN  UNUSUAL VAGINAL DISCHARGE OR ITCHING   Items with * indicate a potential emergency and should be followed up as soon as possible or go to the Emergency Department if any problems should occur.  Please show the CHEMOTHERAPY ALERT CARD or IMMUNOTHERAPY ALERT CARD at  check-in to the Emergency Department and triage nurse.  Should you have questions after your visit or need to cancel or reschedule your appointment, please contact [facility]  Dept: [phone]  and follow the prompts.  Office hours are 8:00 a.m. to 4:30 p.m. Monday - Friday. Please note that voicemails left after 4:00 p.m. may not be returned until the following business day.  We are closed weekends and major holidays. You have access to a nurse at all times for urgent questions. Please call the main number to the clinic Dept: [phone] and follow the prompts.   For any non-urgent questions, you may also contact your provider using MyChart. We now offer e-Visits for anyone 58 and older to request care online for non-urgent symptoms. For details visit [email or web address].   Also download the MyChart app! Go to the app store, search "MyChart", open the app, select [facility], and log in with your MyChart username and password.  Masks are optional in the cancer centers. If you would like for your care team to wear a mask while they are taking care of you, please let them know. You may have one support person who is at least 56 years old accompany you for your appointments.

## 2022-02-27 LAB — T4: T4, Total: 8.5 ug/dL (ref 4.5–12.0)

## 2022-03-14 ENCOUNTER — Telehealth: Payer: Self-pay | Admitting: Internal Medicine

## 2022-03-14 NOTE — Telephone Encounter (Signed)
Called patient regarding upcoming appointments. Patient notified.

## 2022-03-20 ENCOUNTER — Encounter: Payer: Self-pay | Admitting: Internal Medicine

## 2022-03-20 ENCOUNTER — Inpatient Hospital Stay: Payer: BC Managed Care – PPO | Attending: Radiation Oncology

## 2022-03-20 ENCOUNTER — Other Ambulatory Visit: Payer: Self-pay

## 2022-03-20 ENCOUNTER — Inpatient Hospital Stay: Payer: BC Managed Care – PPO | Admitting: Internal Medicine

## 2022-03-20 ENCOUNTER — Inpatient Hospital Stay: Payer: BC Managed Care – PPO

## 2022-03-20 DIAGNOSIS — Z95828 Presence of other vascular implants and grafts: Secondary | ICD-10-CM

## 2022-03-20 DIAGNOSIS — C7972 Secondary malignant neoplasm of left adrenal gland: Secondary | ICD-10-CM | POA: Insufficient documentation

## 2022-03-20 DIAGNOSIS — Z79899 Other long term (current) drug therapy: Secondary | ICD-10-CM | POA: Diagnosis not present

## 2022-03-20 DIAGNOSIS — Z5111 Encounter for antineoplastic chemotherapy: Secondary | ICD-10-CM | POA: Diagnosis not present

## 2022-03-20 DIAGNOSIS — C3491 Malignant neoplasm of unspecified part of right bronchus or lung: Secondary | ICD-10-CM

## 2022-03-20 DIAGNOSIS — Z5112 Encounter for antineoplastic immunotherapy: Secondary | ICD-10-CM | POA: Insufficient documentation

## 2022-03-20 DIAGNOSIS — C7971 Secondary malignant neoplasm of right adrenal gland: Secondary | ICD-10-CM | POA: Insufficient documentation

## 2022-03-20 LAB — CMP (CANCER CENTER ONLY)
ALT: 22 U/L (ref 0–44)
AST: 30 U/L (ref 15–41)
Albumin: 3.7 g/dL (ref 3.5–5.0)
Alkaline Phosphatase: 83 U/L (ref 38–126)
Anion gap: 5 (ref 5–15)
BUN: 12 mg/dL (ref 6–20)
CO2: 27 mmol/L (ref 22–32)
Calcium: 9.1 mg/dL (ref 8.9–10.3)
Chloride: 107 mmol/L (ref 98–111)
Creatinine: 0.63 mg/dL (ref 0.44–1.00)
GFR, Estimated: >60 mL/min (ref >60)
Glucose, Bld: 94 mg/dL (ref 70–99)
Potassium: 4.5 mmol/L (ref 3.5–5.1)
Sodium: 139 mmol/L (ref 135–145)
Total Bilirubin: 0.3 mg/dL (ref 0.3–1.2)
Total Protein: 6.7 g/dL (ref 6.5–8.1)

## 2022-03-20 LAB — CBC WITH DIFFERENTIAL (CANCER CENTER ONLY)
Abs Immature Granulocytes: 0.03 10*3/uL (ref 0.00–0.07)
Basophils Absolute: 0.1 10*3/uL (ref 0.0–0.1)
Basophils Relative: 1 %
Eosinophils Absolute: 0.2 10*3/uL (ref 0.0–0.5)
Eosinophils Relative: 3 %
HCT: 33.3 % — ABNORMAL LOW (ref 36.0–46.0)
Hemoglobin: 11.5 g/dL — ABNORMAL LOW (ref 12.0–15.0)
Immature Granulocytes: 1 %
Lymphocytes Relative: 31 %
Lymphs Abs: 1.9 10*3/uL (ref 0.7–4.0)
MCH: 35.1 pg — ABNORMAL HIGH (ref 26.0–34.0)
MCHC: 34.5 g/dL (ref 30.0–36.0)
MCV: 101.5 fL — ABNORMAL HIGH (ref 80.0–100.0)
Monocytes Absolute: 1.1 10*3/uL — ABNORMAL HIGH (ref 0.1–1.0)
Monocytes Relative: 18 %
Neutro Abs: 2.8 10*3/uL (ref 1.7–7.7)
Neutrophils Relative %: 46 %
Platelet Count: 254 10*3/uL (ref 150–400)
RBC: 3.28 MIL/uL — ABNORMAL LOW (ref 3.87–5.11)
RDW: 14.1 % (ref 11.5–15.5)
WBC Count: 6.2 10*3/uL (ref 4.0–10.5)
nRBC: 0 % (ref 0.0–0.2)

## 2022-03-20 LAB — TSH: TSH: 1.075 u[IU]/mL (ref 0.350–4.500)

## 2022-03-20 MED ORDER — SODIUM CHLORIDE 0.9 % IV SOLN
200.0000 mg | Freq: Once | INTRAVENOUS | Status: AC
  Administered 2022-03-20: 200 mg via INTRAVENOUS
  Filled 2022-03-20: qty 200

## 2022-03-20 MED ORDER — SODIUM CHLORIDE 0.9% FLUSH
10.0000 mL | INTRAVENOUS | Status: AC | PRN
  Administered 2022-03-20: 10 mL

## 2022-03-20 MED ORDER — PROCHLORPERAZINE MALEATE 10 MG PO TABS
10.0000 mg | ORAL_TABLET | Freq: Once | ORAL | Status: AC
  Administered 2022-03-20: 10 mg via ORAL
  Filled 2022-03-20: qty 1

## 2022-03-20 MED ORDER — SODIUM CHLORIDE 0.9 % IV SOLN: Freq: Once | INTRAVENOUS | Status: AC

## 2022-03-20 MED ORDER — SODIUM CHLORIDE 0.9 % IV SOLN
500.0000 mg/m2 | Freq: Once | INTRAVENOUS | Status: AC
  Administered 2022-03-20: 700 mg via INTRAVENOUS
  Filled 2022-03-20: qty 20

## 2022-03-20 MED ORDER — SODIUM CHLORIDE 0.9 % IV SOLN: INTRAVENOUS | Status: AC

## 2022-03-20 NOTE — Progress Notes (Signed)
Per Dr Julien Nordmann it is okay to run IVF concurrent with treatment today . It is okay to treat pt today with Carboplatin,alimta Pembrolizumab and BP of 85/56.

## 2022-03-20 NOTE — Progress Notes (Signed)
[place] [phone]   [phone]  OFFICE PROGRESS NOTE  [name], FNP [address] [place] [address]  DIAGNOSIS: Stage IV (T2b, N2, M1 C) non-small cell lung cancer, adenocarcinoma presented with right hilar mass in addition to mediastinal and upper abdominal lymphadenopathy in addition to bilateral adrenal metastases and metastatic disease in the musculature of the lower back.  This was diagnosed in April 2023.  DETECTED ALTERATION(S) / BIOMARKER(S) % CFDNA OR AMPLIFICATION ASSOCIATED FDA-APPROVED THERAPIES CLINICAL TRIAL AVAILABILITY [id number] 0.5% None  Yes [id number] (Splice Site Indel) 5.6% None  Yes  PD-L1 expression 0%  PRIOR THERAPY: Palliative radiotherapy to the right hilar region under the care of Dr. Lisbeth Renshaw.  CURRENT THERAPY:  systemic chemotherapy with carboplatin for AUC of 5, Alimta 500 Mg/M2 and Keytruda 200 Mg IV every 3 weeks.  First dose August 29, 2021.  Status post 9 cycles.  Starting from cycle #5 the patient is on maintenance treatment with Alimta and Keytruda every 3 weeks.  INTERVAL HISTORY: April Walsh 56 y.o. female returns to the clinic today for follow-up visit accompanied by family member.  The patient is feeling fine today with no concerning complaints except for mild fatigue.  She denied having any current chest pain, shortness of breath, cough or hemoptysis.  She has no nausea, vomiting, diarrhea or constipation.  She has no headache or visual changes.  She denied having any weight loss or night sweats.  She is here today for evaluation before starting cycle #10 of her treatment.  MEDICAL HISTORY: Past Medical History:  Diagnosis Date   Anemia    Asthma    Bronchitis    Cancer ([facility])    basal cell melanoma, cervical cancer   COPD (chronic obstructive pulmonary disease) ([facility])    "early stages of COPD"   Headache    Lung cancer ([facility]) 08/13/2021    ALLERGIES:  has No  Known Allergies.  MEDICATIONS:  Current Outpatient Medications  Medication Sig Dispense Refill   apixaban (ELIQUIS) 2.5 MG TABS tablet Take 1 tablet (2.5 mg total) by mouth 2 (two) times daily. 60 tablet 0   Budeson-Glycopyrrol-Formoterol (BREZTRI AEROSPHERE) 160-9-4.8 MCG/ACT AERO Inhale 2 puffs into the lungs in the morning and at bedtime. 43.3 g 11   folic acid (FOLVITE) 1 MG tablet TAKE 1 TABLET BY MOUTH EVERY DAY 30 tablet 2   lidocaine-prilocaine (EMLA) cream Apply 1 Application topically as needed. 30 g 2   prochlorperazine (COMPAZINE) 10 MG tablet Take 1 tablet (10 mg total) by mouth every 6 (six) hours as needed. 30 tablet 2   triamcinolone cream (KENALOG) 0.1 % Apply 1 application  topically 2 (two) times daily as needed. 80 g 0   No current facility-administered medications for this visit.   Facility-Administered Medications Ordered in Other Visits  Medication Dose Route Frequency Provider Last Rate Last Admin   cyanocobalamin (VITAMIN B12) 1000 MCG/ML injection            prochlorperazine (COMPAZINE) 10 MG tablet             SURGICAL HISTORY:  Past Surgical History:  Procedure Laterality Date   APPENDECTOMY     1996   BREAST BIOPSY Right    2010-2015   BRONCHIAL BIOPSY  08/13/2021   Procedure: BRONCHIAL BIOPSIES;  Surgeon: Collene Gobble, MD;  Location[facility] ENDOSCOPY;  Service: Pulmonary;;   BRONCHIAL BRUSHINGS  08/13/2021   Procedure: BRONCHIAL BRUSHINGS;  Surgeon: Lamonte Sakai,  Rose Fillers, MD;  Location: [facility] ENDOSCOPY;  Service: Pulmonary;;   BRONCHIAL NEEDLE ASPIRATION BIOPSY  08/13/2021   Procedure: BRONCHIAL NEEDLE ASPIRATION BIOPSIES;  Surgeon: Collene Gobble, MD;  Location: [facility] ENDOSCOPY;  Service: Pulmonary;;   CESAREAN SECTION     1984, 1987   FOOT SURGERY Right    2 pins and screw 1999   FOOT SURGERY Left 06/21/2021   screw and 2 pins   HEMOSTASIS CONTROL  08/13/2021   Procedure: HEMOSTASIS CONTROL;  Surgeon: Collene Gobble, MD;  Location: [facility] ENDOSCOPY;  Service:  Pulmonary;;   IR IMAGING GUIDED PORT INSERTION  11/26/2021   MELANOMA EXCISION Left    basal cell melanoma 2011   [facility]   VIDEO BRONCHOSCOPY  08/13/2021   Procedure: VIDEO BRONCHOSCOPY WITHOUT FLUORO;  Surgeon: Collene Gobble, MD;  Location: [facility] ENDOSCOPY;  Service: Pulmonary;;   VIDEO BRONCHOSCOPY WITH ENDOBRONCHIAL ULTRASOUND N/A 08/13/2021   Procedure: VIDEO BRONCHOSCOPY WITH ENDOBRONCHIAL ULTRASOUND;  Surgeon: Collene Gobble, MD;  Location: [facility] ENDOSCOPY;  Service: Pulmonary;  Laterality: N/A;    REVIEW OF SYSTEMS:  A comprehensive review of systems was negative except for: Constitutional: positive for fatigue   PHYSICAL EXAMINATION: General appearance: alert, cooperative, and no distress Head: Normocephalic, without obvious abnormality, atraumatic Neck: no adenopathy, no JVD, supple, symmetrical, trachea midline, and thyroid not enlarged, symmetric, no tenderness/mass/nodules Lymph nodes: Cervical, supraclavicular, and axillary nodes normal. Resp: clear to auscultation bilaterally Back: symmetric, no curvature. ROM normal. No CVA tenderness. Cardio: regular rate and rhythm, S1, S2 normal, no murmur, click, rub or gallop GI: soft, non-tender; bowel sounds normal; no masses,  no organomegaly Extremities: extremities normal, atraumatic, no cyanosis or edema  ECOG PERFORMANCE STATUS: 1 - Symptomatic but completely ambulatory  Blood pressure (!) 85/56, pulse 93, temperature 97.9 F (36.6 C), temperature source Oral, resp. rate 14, height 5' (1.524 m), weight 107 lb (48.5 kg), SpO2 99 %.  LABORATORY DATA: Lab Results  Component Value Date   WBC 6.2 03/20/2022   HGB 11.5 (L) 03/20/2022   HCT 33.3 (L) 03/20/2022   MCV 101.5 (H) 03/20/2022   PLT 254 03/20/2022      Chemistry      Component Value Date/Time   NA 138 02/26/2022 1019   K 3.9 02/26/2022 1019   CL 107 02/26/2022 1019   CO2 25 02/26/2022 1019   BUN 13 02/26/2022 1019    CREATININE 0.56 02/26/2022 1019      Component Value Date/Time   CALCIUM 9.1 02/26/2022 1019   ALKPHOS 80 02/26/2022 1019   AST 18 02/26/2022 1019   ALT 10 02/26/2022 1019   BILITOT 0.3 02/26/2022 1019       RADIOGRAPHIC STUDIES: No results found.  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  This was diagnosed in April 2023. The molecular studies by [employer] showed no actionable mutations and the patient has negative PD-L1 expression. She is currently undergoing palliative radiotherapy to the right hilar region under the care of Dr. Lisbeth Renshaw. The patient is currently on systemic chemotherapy started with carboplatin for AUC of 5, Alimta 500 Mg/M2 and Keytruda 200 Mg IV every 3  weeks and starting from cycle #5 she is on maintenance treatment with Alimta and Keytruda every 3 weeks.  Status post a total of 9 cycles. The patient has been tolerating her treatment with maintenance Alimta and Keytruda fairly well. I recommended for her to proceed with cycle #10 today as planned. For the hypertension, I will arrange for the patient to receive normal saline 500 IV today. I will see her back for follow-up visit in 3 weeks for evaluation before the next cycle of her treatment. The patient was advised to call immediately if she has any other concerning symptoms in the interval. The patient voices understanding of current disease status and treatment options and is in agreement with the current care plan.  All questions were answered. The patient knows to call the clinic with any problems, questions or concerns. We can certainly see the patient much sooner if necessary.  The total time spent in the  appointment was 20 minutes.  Disclaimer: This note was dictated with voice recognition software. Similar sounding words can inadvertently be transcribed and may not be corrected upon review.

## 2022-03-20 NOTE — Progress Notes (Signed)
Per Dr Julien Nordmann, ok to treat with low BP today.  Additional IVF ordered

## 2022-03-20 NOTE — Patient Instructions (Signed)
[facility]  Discharge Instructions: Thank you for choosing [facility] to provide your oncology and hematology care.   If you have a lab appointment with the [facility], please go directly to the [facility] and check in at the registration area.   Wear comfortable clothing and clothing appropriate for easy access to any Portacath or PICC line.   We strive to give you quality time with your provider. You may need to reschedule your appointment if you arrive late (15 or more minutes).  Arriving late affects you and other patients whose appointments are after yours.  Also, if you miss three or more appointments without notifying the office, you may be dismissed from the clinic at the provider's discretion.      For prescription refill requests, have your pharmacy contact our office and allow 72 hours for refills to be completed.    Today you received the following chemotherapy and/or immunotherapy agents: Keytruda/Alimta      To help prevent nausea and vomiting after your treatment, we encourage you to take your nausea medication as directed.  BELOW ARE SYMPTOMS THAT SHOULD BE REPORTED IMMEDIATELY: *FEVER GREATER THAN 100.4 F (38 C) OR HIGHER *CHILLS OR SWEATING *NAUSEA AND VOMITING THAT IS NOT CONTROLLED WITH YOUR NAUSEA MEDICATION *UNUSUAL SHORTNESS OF BREATH *UNUSUAL BRUISING OR BLEEDING *URINARY PROBLEMS (pain or burning when urinating, or frequent urination) *BOWEL PROBLEMS (unusual diarrhea, constipation, pain near the anus) TENDERNESS IN MOUTH AND THROAT WITH OR WITHOUT PRESENCE OF ULCERS (sore throat, sores in mouth, or a toothache) UNUSUAL RASH, SWELLING OR PAIN  UNUSUAL VAGINAL DISCHARGE OR ITCHING   Items with * indicate a potential emergency and should be followed up as soon as possible or go to the Emergency Department if any problems should occur.  Please show the CHEMOTHERAPY ALERT CARD or IMMUNOTHERAPY ALERT CARD at  check-in to the Emergency Department and triage nurse.  Should you have questions after your visit or need to cancel or reschedule your appointment, please contact [facility]  Dept: [phone]  and follow the prompts.  Office hours are 8:00 a.m. to 4:30 p.m. Monday - Friday. Please note that voicemails left after 4:00 p.m. may not be returned until the following business day.  We are closed weekends and major holidays. You have access to a nurse at all times for urgent questions. Please call the main number to the clinic Dept: [phone] and follow the prompts.   For any non-urgent questions, you may also contact your provider using MyChart. We now offer e-Visits for anyone 65 and older to request care online for non-urgent symptoms. For details visit [email or web address].   Also download the MyChart app! Go to the app store, search "MyChart", open the app, select [facility], and log in with your MyChart username and password.  Masks are optional in the cancer centers. If you would like for your care team to wear a mask while they are taking care of you, please let them know. You may have one support person who is at least 56 years old accompany you for your appointments.

## 2022-04-05 NOTE — Progress Notes (Signed)
[facility] OFFICE PROGRESS NOTE  [name], FNP [address] [place] [address]  DIAGNOSIS:  Stage IV (T2b, N2, M1 C) non-small cell lung cancer, adenocarcinoma presented with right hilar mass in addition to mediastinal and upper abdominal lymphadenopathy in addition to bilateral adrenal metastases and metastatic disease in the musculature of the lower back.  This was diagnosed in April 2023.   DETECTED ALTERATION(S) / BIOMARKER(S)     % CFDNA OR AMPLIFICATION        ASSOCIATED FDA-APPROVED THERAPIES         CLINICAL TRIAL AVAILABILITY [id number] 0.5% None     Yes [id number] (Splice Site Indel) 0.0% None     Yes   PD-L1 expression 0%  PRIOR THERAPY: Palliative radiotherapy to the right hilar region under the care of Dr. Lisbeth Renshaw.    CURRENT THERAPY: Systemic chemotherapy with carboplatin for AUC of 5, Alimta 500 Mg/M2 and Keytruda 200 Mg IV every 3 weeks.  First dose August 29, 2021. Status post 10 cycles.  Starting from cycle #5, the patient started maintenance Alimta and Keytruda.   INTERVAL HISTORY: April Walsh 56 y.o. female returns to the clinic today for a follow-up visit. The patient is feeling fairly well today without any concerning complaints.  The patient is currently undergoing chemotherapy and immunotherapy.  She denies any fever, chills, or night sweats.  She gained several pounds due to eating well after Thanksgiving. She was previously seen by member the nutritionist team.  She is seen by Dr. Lamonte Sakai from pulmonology. Denies any changes in her breathing. Denies changes in her baseline dyspnea on exertion. Denies significant cough. She denies any chest pain or hemoptysis.  Denies any nausea, vomiting, diarrhea, or constipation.  Denies any headache or visual changes.  She denies any rashes or skin changes.  She is here today for evaluation and repeat blood work before starting cycle #11.    MEDICAL HISTORY: Past Medical History:   Diagnosis Date   Anemia    Asthma    Bronchitis    Cancer ([place])    basal cell melanoma, cervical cancer   COPD (chronic obstructive pulmonary disease) ([place])    "early stages of COPD"   Headache    Lung cancer ([place]) 08/13/2021    ALLERGIES:  has No Known Allergies.  MEDICATIONS:  Current Outpatient Medications  Medication Sig Dispense Refill   apixaban (ELIQUIS) 2.5 MG TABS tablet Take 1 tablet (2.5 mg total) by mouth 2 (two) times daily. 60 tablet 0   Budeson-Glycopyrrol-Formoterol (BREZTRI AEROSPHERE) 160-9-4.8 MCG/ACT AERO Inhale 2 puffs into the lungs in the morning and at bedtime. 93.8 g 11   folic acid (FOLVITE) 1 MG tablet TAKE 1 TABLET BY MOUTH EVERY DAY 30 tablet 2   lidocaine-prilocaine (EMLA) cream Apply 1 Application topically as needed. 30 g 2   prochlorperazine (COMPAZINE) 10 MG tablet Take 1 tablet (10 mg total) by mouth every 6 (six) hours as needed. 30 tablet 2   triamcinolone cream (KENALOG) 0.1 % Apply 1 application  topically 2 (two) times daily as needed. 80 g 0   No current facility-administered medications for this visit.   Facility-Administered Medications Ordered in Other Visits  Medication Dose Route Frequency Provider Last Rate Last Admin   0.9 %  sodium chloride infusion   Intravenous Once Curt Bears, MD       cyanocobalamin (VITAMIN B12) 1000 MCG/ML injection            heparin lock  flush 100 unit/mL  500 Units Intracatheter Once PRN Curt Bears, MD       pembrolizumab [facility]) 200 mg in sodium chloride 0.9 % 50 mL chemo infusion  200 mg Intravenous Once Curt Bears, MD       PEMEtrexed (ALIMTA) 700 mg in sodium chloride 0.9 % 100 mL chemo infusion  500 mg/m2 (Treatment Plan Recorded) Intravenous Once Curt Bears, MD       prochlorperazine (COMPAZINE) 10 MG tablet            prochlorperazine (COMPAZINE) tablet 10 mg  10 mg Oral Once Curt Bears, MD       sodium chloride flush (NS) 0.9 % injection 10 mL  10 mL Intracatheter  PRN Curt Bears, MD        SURGICAL HISTORY:  Past Surgical History:  Procedure Laterality Date   APPENDECTOMY     1996   BREAST BIOPSY Right    2010-2015   BRONCHIAL BIOPSY  08/13/2021   Procedure: BRONCHIAL BIOPSIES;  Surgeon: Collene Gobble, MD;  Location: [facility] ENDOSCOPY;  Service: Pulmonary;;   BRONCHIAL BRUSHINGS  08/13/2021   Procedure: BRONCHIAL BRUSHINGS;  Surgeon: Collene Gobble, MD;  Location: [facility] ENDOSCOPY;  Service: Pulmonary;;   BRONCHIAL NEEDLE ASPIRATION BIOPSY  08/13/2021   Procedure: BRONCHIAL NEEDLE ASPIRATION BIOPSIES;  Surgeon: Collene Gobble, MD;  Location: [facility] ENDOSCOPY;  Service: Pulmonary;;   CESAREAN [place] Right    2 pins and screw 1999   FOOT SURGERY Left 06/21/2021   screw and 2 pins   HEMOSTASIS CONTROL  08/13/2021   Procedure: HEMOSTASIS CONTROL;  Surgeon: Collene Gobble, MD;  Location: [facility];  Service: Pulmonary;;   IR IMAGING GUIDED PORT INSERTION  11/26/2021   MELANOMA EXCISION Left    basal cell melanoma 2011   [place]  08/13/2021   Procedure: VIDEO BRONCHOSCOPY WITHOUT FLUORO;  Surgeon: Collene Gobble, MD;  Location: [facility] ENDOSCOPY;  Service: Pulmonary;;   VIDEO BRONCHOSCOPY WITH ENDOBRONCHIAL ULTRASOUND N/A 08/13/2021   Procedure: VIDEO BRONCHOSCOPY WITH ENDOBRONCHIAL ULTRASOUND;  Surgeon: Collene Gobble, MD;  Location: [facility] ENDOSCOPY;  Service: Pulmonary;  Laterality: N/A;    REVIEW OF SYSTEMS:   Review of Systems  Expand All Collapse All  [place] OFFICE PROGRESS NOTE   [name], FNP [address] [place] [address]   DIAGNOSIS:  Stage IV (T2b, N2, M1 C) non-small cell lung cancer, adenocarcinoma presented with right hilar mass in addition to mediastinal and upper abdominal lymphadenopathy in addition to bilateral adrenal metastases and metastatic disease in the musculature of the lower back.  This  was diagnosed in April 2023.   DETECTED ALTERATION(S) / BIOMARKER(S)     % CFDNA OR AMPLIFICATION        ASSOCIATED FDA-APPROVED THERAPIES         CLINICAL TRIAL AVAILABILITY [id number] 0.5% None     Yes [id number] (Splice Site Indel) 6.2% None     Yes   PD-L1 expression 0%   PRIOR THERAPY:  Palliative radiotherapy to the right hilar region under the care of Dr. Lisbeth Renshaw.    CURRENT THERAPY: Systemic chemotherapy with carboplatin for AUC of 5, Alimta 500 Mg/M2 and Keytruda 200 Mg IV every 3 weeks.  First dose August 29, 2021. Status post 8 cycles.  Starting from cycle #  5, the patient started maintenance Alimta and Keytruda.   INTERVAL HISTORY: April Walsh 56 y.o. female returns to the clinic today for a follow-up visit accompanied by her Aunt.  The patient is feeling fairly well today without any concerning complaints.  She she is expected to undergo a dental extraction of 2 teeth in the near future.  She mentions that her dentist may get in touch with our clinic for permission.  The patient is currently undergoing chemotherapy and immunotherapy.  She denies any fever, chills, or night sweats.  Her weight is stable although her appetite comes and goes.  Particularly, in the morning, she does not feel as hungry.  She was previously seen by member the nutritionist team.  She reports improvement in her shortness of breath since being diagnosed but does have mild cough before bed. She states she generally does not cough lot during the day unless talking.  She is seen by Dr. Lamonte Sakai from pulmonology.  She denies any chest pain or hemoptysis.  Denies any nausea, vomiting, diarrhea, or constipation.  Denies any headache or visual changes.  She denies any rashes or skin changes.  She is here today for evaluation and repeat blood work before starting cycle #9.      MEDICAL HISTORY:     Past Medical History:  Diagnosis Date   Anemia     Asthma     Bronchitis     Cancer ([place])      basal cell  melanoma, cervical cancer   COPD (chronic obstructive pulmonary disease) ([place])      "early stages of COPD"   Headache     Lung cancer ([place]) 08/13/2021      ALLERGIES:  has No Known Allergies.   MEDICATIONS:        Current Outpatient Medications  Medication Sig Dispense Refill   apixaban (ELIQUIS) 2.5 MG TABS tablet Take 1 tablet (2.5 mg total) by mouth 2 (two) times daily. 60 tablet 0   Budeson-Glycopyrrol-Formoterol (BREZTRI AEROSPHERE) 160-9-4.8 MCG/ACT AERO Inhale 2 puffs into the lungs in the morning and at bedtime. 17.4 g 11   folic acid (FOLVITE) 1 MG tablet TAKE 1 TABLET BY MOUTH EVERY DAY 30 tablet 2   lidocaine-prilocaine (EMLA) cream Apply 1 Application topically as needed. 30 g 2   prochlorperazine (COMPAZINE) 10 MG tablet Take 1 tablet (10 mg total) by mouth every 6 (six) hours as needed. 30 tablet 2   triamcinolone cream (KENALOG) 0.1 % Apply 1 application  topically 2 (two) times daily as needed. 80 g 0    No current facility-administered medications for this visit.             Facility-Administered Medications Ordered in Other Visits  Medication Dose Route Frequency Provider Last Rate Last Admin   cyanocobalamin (VITAMIN B12) 1000 MCG/ML injection               cyanocobalamin (VITAMIN B12) injection 1,000 mcg  1,000 mcg Intramuscular Once Curt Bears, MD       heparin lock flush 100 unit/mL  500 Units Intracatheter Once PRN Curt Bears, MD       pembrolizumab [facility]) 200 mg in sodium chloride 0.9 % 50 mL chemo infusion  200 mg Intravenous Once Curt Bears, MD       PEMEtrexed (ALIMTA) 700 mg in sodium chloride 0.9 % 100 mL chemo infusion  500 mg/m2 (Treatment Plan Recorded) Intravenous Once Curt Bears, MD       prochlorperazine (COMPAZINE) 10 MG tablet  prochlorperazine (COMPAZINE) tablet 10 mg  10 mg Oral Once Curt Bears, MD       sodium chloride flush (NS) 0.9 % injection 10 mL  10 mL Intracatheter PRN Curt Bears, MD           SURGICAL HISTORY:       Past Surgical History:  Procedure Laterality Date   APPENDECTOMY        1996   BREAST BIOPSY Right      2010-2015   BRONCHIAL BIOPSY   08/13/2021    Procedure: BRONCHIAL BIOPSIES;  Surgeon: Collene Gobble, MD;  Location: [facility] ENDOSCOPY;  Service: Pulmonary;;   BRONCHIAL BRUSHINGS   08/13/2021    Procedure: BRONCHIAL BRUSHINGS;  Surgeon: Collene Gobble, MD;  Location[facility] ENDOSCOPY;  Service: Pulmonary;;   BRONCHIAL NEEDLE ASPIRATION BIOPSY   08/13/2021    Procedure: BRONCHIAL NEEDLE ASPIRATION BIOPSIES;  Surgeon: Collene Gobble, MD;  Location: [facility] ENDOSCOPY;  Service: Pulmonary;;   CESAREAN [place] Right      2 pins and screw 1999   FOOT SURGERY Left 06/21/2021    screw and 2 pins   HEMOSTASIS CONTROL   08/13/2021    Procedure: HEMOSTASIS CONTROL;  Surgeon: Collene Gobble, MD;  Location: [facility];  Service: Pulmonary;;   IR IMAGING GUIDED PORT INSERTION   11/26/2021   MELANOMA EXCISION Left      basal cell melanoma 2011   [place] BRONCHOSCOPY   08/13/2021    Procedure: VIDEO BRONCHOSCOPY WITHOUT FLUORO;  Surgeon: Collene Gobble, MD;  Location: [facility] ENDOSCOPY;  Service: Pulmonary;;   VIDEO BRONCHOSCOPY WITH ENDOBRONCHIAL ULTRASOUND N/A 08/13/2021    Procedure: VIDEO BRONCHOSCOPY WITH ENDOBRONCHIAL ULTRASOUND;  Surgeon: Collene Gobble, MD;  Location: [facility] ENDOSCOPY;  Service: Pulmonary;  Laterality: N/A;      REVIEW OF SYSTEMS:   Review of Systems  Constitutional: Negative for appetite change, chills, fatigue, and fever HENT: Negative for mouth sores, nosebleeds, sore throat and trouble swallowing.   Eyes: Negative for eye problems and icterus.  Respiratory: Positive for improving dyspnea with activities. Negative for hemoptysis and wheezing.   Cardiovascular: Negative for chest pain and leg swelling.  Gastrointestinal: Negative for abdominal pain, nausea,  constipation, diarrhea, and vomiting.  Genitourinary: Negative for bladder incontinence, difficulty urinating, dysuria, frequency and hematuria.   Musculoskeletal: Negative for back pain, gait problem, neck pain and neck stiffness.  Skin:Negative for rash or skin changes.  Neurological: Negative for dizziness, extremity weakness, gait problem, headaches, light-headedness and seizures.  Hematological: Negative for adenopathy. Does not bruise/bleed easily.  Psychiatric/Behavioral: Negative for confusion, depression and sleep disturbance. The patient is not nervous/anxious.      PHYSICAL EXAMINATION:  Blood pressure 110/68, pulse 98, temperature 98.4 F (36.9 C), temperature source Oral, resp. rate 18, weight 112 lb (50.8 kg), SpO2 98 %.  ECOG PERFORMANCE STATUS: 1  Physical Exam  Constitutional: Oriented to person, place, and time and positive for thin appearing female and in no acute distress.  HENT:  Head: Normocephalic and atraumatic.  Mouth/Throat: Oropharynx is clear and moist. No oropharyngeal exudate.  Eyes: Conjunctivae are normal. Right eye exhibits no discharge. Left eye exhibits no discharge. No scleral icterus.  Neck: Normal range of motion. Neck supple.  Cardiovascular: Normal rate, regular rhythm, normal heart sounds  and intact distal pulses.   Pulmonary/Chest: Effort normal and breath sounds normal. No respiratory distress. No wheezes. No rales.  Abdominal: Soft. Bowel sounds are normal. Exhibits no distension and no mass. There is no tenderness.  Musculoskeletal: Normal range of motion. Exhibits no edema.  Lymphadenopathy:    No cervical adenopathy.  Neurological: Alert and oriented to person, place, and time. Exhibits normal muscle tone. Gait normal. Coordination normal.  Skin: Not diaphoretic. No erythema. No pallor.  Psychiatric: Mood, memory and judgment normal.  Vitals reviewed.  LABORATORY DATA: Lab Results  Component Value Date   WBC 7.9 04/10/2022   HGB 11.4  (L) 04/10/2022   HCT 34.9 (L) 04/10/2022   MCV 104.2 (H) 04/10/2022   PLT 275 04/10/2022      Chemistry      Component Value Date/Time   NA 137 04/10/2022 0758   K 4.0 04/10/2022 0758   CL 105 04/10/2022 0758   CO2 26 04/10/2022 0758   BUN 18 04/10/2022 0758   CREATININE 0.65 04/10/2022 0758      Component Value Date/Time   CALCIUM 9.6 04/10/2022 0758   ALKPHOS 92 04/10/2022 0758   AST 30 04/10/2022 0758   ALT 24 04/10/2022 0758   BILITOT 0.2 (L) 04/10/2022 0758       RADIOGRAPHIC STUDIES:  No results found.   ASSESSMENT/PLAN:  This is a very pleasant 56 years old white female with Stage IV (T2b, N2, M1 C) non-small cell lung cancer, adenocarcinoma presented with right hilar mass in addition to mediastinal and upper abdominal lymphadenopathy in addition to bilateral adrenal metastases and metastatic disease in the musculature of the lower back.  This was diagnosed in April 2023.   The molecular studies by [employer] showed no actionable mutations and the patient has negative PD-L1 expression. She is currently undergoing palliative radiotherapy to the right hilar region under the care of Dr. Lisbeth Renshaw.   She completed palliative radiation to the right hilar area under the care of Dr. Lisbeth Renshaw.   She is currently undergoing palliative systemic chemotherapy with carboplatin for an AUC of 5, Alimta 500 mg per metered squared, Keytruda 200 mg IV every 3 weeks.  She is status post 10 cycles.  Her first dose was on 08/29/2021. Starting from cycle #5, she started maintenance alimta and [place].    Labs were reviewed. Recommend she proceed with cycle #11 today as scheduled.   I will arrange for a restaging CT scan of the CAP prior to her next appointment.    We will see the patient back for follow-up visit in 3 weeks for evaluation and repeat blood work before starting cycle #12.    The patient was advised to call immediately if she has any concerning symptoms in the interval. The  patient voices understanding of current disease status and treatment options and is in agreement with the current care plan. All questions were answered. The patient knows to call the clinic with any problems, questions or concerns. We can certainly see the patient much sooner if necessary           Orders Placed This Encounter  Procedures   CT Chest W Contrast    Standing Status:   Future    Standing Expiration Date:   04/10/2023    Order Specific Question:   If indicated for the ordered procedure, I authorize the administration of contrast media per Radiology protocol    Answer:   Yes    Order Specific Question:   Does  the patient have a contrast media/X-ray dye allergy?    Answer:   No    Order Specific Question:   Is patient pregnant?    Answer:   No    Order Specific Question:   Preferred imaging location?    Answer:   [facility]   CT Abdomen Pelvis W Contrast    Standing Status:   Future    Standing Expiration Date:   04/10/2023    Order Specific Question:   If indicated for the ordered procedure, I authorize the administration of contrast media per Radiology protocol    Answer:   Yes    Order Specific Question:   Does the patient have a contrast media/X-ray dye allergy?    Answer:   No    Order Specific Question:   Is patient pregnant?    Answer:   No    Order Specific Question:   Preferred imaging location?    Answer:   [facility]    Order Specific Question:   Is Oral Contrast requested for this exam?    Answer:   Yes, Per Radiology protocol      The total time spent in the appointment was 20-29 minutes.   Marquavion Venhuizen L Zyrion Coey, PA-C 04/10/22

## 2022-04-10 ENCOUNTER — Inpatient Hospital Stay: Payer: BC Managed Care – PPO

## 2022-04-10 ENCOUNTER — Inpatient Hospital Stay: Payer: BC Managed Care – PPO | Admitting: Physician Assistant

## 2022-04-10 ENCOUNTER — Other Ambulatory Visit: Payer: Self-pay

## 2022-04-10 VITALS — BP 98/66 | HR 97 | Resp 18

## 2022-04-10 VITALS — BP 110/68 | HR 98 | Temp 98.4°F | Resp 18 | Wt 112.0 lb

## 2022-04-10 DIAGNOSIS — Z5112 Encounter for antineoplastic immunotherapy: Secondary | ICD-10-CM | POA: Diagnosis not present

## 2022-04-10 DIAGNOSIS — Z79899 Other long term (current) drug therapy: Secondary | ICD-10-CM | POA: Diagnosis not present

## 2022-04-10 DIAGNOSIS — Z5111 Encounter for antineoplastic chemotherapy: Secondary | ICD-10-CM

## 2022-04-10 DIAGNOSIS — C3491 Malignant neoplasm of unspecified part of right bronchus or lung: Secondary | ICD-10-CM | POA: Diagnosis not present

## 2022-04-10 DIAGNOSIS — C7972 Secondary malignant neoplasm of left adrenal gland: Secondary | ICD-10-CM | POA: Diagnosis not present

## 2022-04-10 DIAGNOSIS — C7971 Secondary malignant neoplasm of right adrenal gland: Secondary | ICD-10-CM | POA: Diagnosis not present

## 2022-04-10 LAB — CMP (CANCER CENTER ONLY)
ALT: 24 U/L (ref 0–44)
AST: 30 U/L (ref 15–41)
Albumin: 3.8 g/dL (ref 3.5–5.0)
Alkaline Phosphatase: 92 U/L (ref 38–126)
Anion gap: 6 (ref 5–15)
BUN: 18 mg/dL (ref 6–20)
CO2: 26 mmol/L (ref 22–32)
Calcium: 9.6 mg/dL (ref 8.9–10.3)
Chloride: 105 mmol/L (ref 98–111)
Creatinine: 0.65 mg/dL (ref 0.44–1.00)
GFR, Estimated: >60 mL/min (ref >60)
Glucose, Bld: 107 mg/dL — ABNORMAL HIGH (ref 70–99)
Potassium: 4 mmol/L (ref 3.5–5.1)
Sodium: 137 mmol/L (ref 135–145)
Total Bilirubin: 0.2 mg/dL — ABNORMAL LOW (ref 0.3–1.2)
Total Protein: 7.1 g/dL (ref 6.5–8.1)

## 2022-04-10 LAB — CBC WITH DIFFERENTIAL (CANCER CENTER ONLY)
Abs Immature Granulocytes: 0.09 10*3/uL — ABNORMAL HIGH (ref 0.00–0.07)
Basophils Absolute: 0.1 10*3/uL (ref 0.0–0.1)
Basophils Relative: 1 %
Eosinophils Absolute: 0.2 10*3/uL (ref 0.0–0.5)
Eosinophils Relative: 2 %
HCT: 34.9 % — ABNORMAL LOW (ref 36.0–46.0)
Hemoglobin: 11.4 g/dL — ABNORMAL LOW (ref 12.0–15.0)
Immature Granulocytes: 1 %
Lymphocytes Relative: 20 %
Lymphs Abs: 1.6 10*3/uL (ref 0.7–4.0)
MCH: 34 pg (ref 26.0–34.0)
MCHC: 32.7 g/dL (ref 30.0–36.0)
MCV: 104.2 fL — ABNORMAL HIGH (ref 80.0–100.0)
Monocytes Absolute: 1.4 10*3/uL — ABNORMAL HIGH (ref 0.1–1.0)
Monocytes Relative: 17 %
Neutro Abs: 4.6 10*3/uL (ref 1.7–7.7)
Neutrophils Relative %: 59 %
Platelet Count: 275 10*3/uL (ref 150–400)
RBC: 3.35 MIL/uL — ABNORMAL LOW (ref 3.87–5.11)
RDW: 14.5 % (ref 11.5–15.5)
WBC Count: 7.9 10*3/uL (ref 4.0–10.5)
nRBC: 0 % (ref 0.0–0.2)

## 2022-04-10 MED ORDER — SODIUM CHLORIDE 0.9 % IV SOLN: Freq: Once | INTRAVENOUS | Status: AC

## 2022-04-10 MED ORDER — SODIUM CHLORIDE 0.9% FLUSH
10.0000 mL | INTRAVENOUS | Status: DC | PRN
  Administered 2022-04-10: 10 mL

## 2022-04-10 MED ORDER — SODIUM CHLORIDE 0.9 % IV SOLN
200.0000 mg | Freq: Once | INTRAVENOUS | Status: AC
  Administered 2022-04-10: 200 mg via INTRAVENOUS
  Filled 2022-04-10: qty 200

## 2022-04-10 MED ORDER — SODIUM CHLORIDE 0.9% FLUSH
10.0000 mL | INTRAVENOUS | Status: DC | PRN
  Administered 2022-04-10: 10 mL via INTRAVENOUS

## 2022-04-10 MED ORDER — HEPARIN SOD (PORK) LOCK FLUSH 100 UNIT/ML IV SOLN
500.0000 [IU] | Freq: Once | INTRAVENOUS | Status: AC | PRN
  Administered 2022-04-10: 500 [IU]

## 2022-04-10 MED ORDER — PROCHLORPERAZINE MALEATE 10 MG PO TABS
10.0000 mg | ORAL_TABLET | Freq: Once | ORAL | Status: AC
  Administered 2022-04-10: 10 mg via ORAL
  Filled 2022-04-10: qty 1

## 2022-04-10 MED ORDER — SODIUM CHLORIDE 0.9 % IV SOLN
500.0000 mg/m2 | Freq: Once | INTRAVENOUS | Status: AC
  Administered 2022-04-10: 700 mg via INTRAVENOUS
  Filled 2022-04-10: qty 20

## 2022-04-10 NOTE — Patient Instructions (Signed)
[facility] ONCOLOGY  Discharge Instructions: Thank you for choosing [facility] to provide your oncology and hematology care.   If you have a lab appointment with the [facility], please go directly to the [facility] and check in at the registration area.   Wear comfortable clothing and clothing appropriate for easy access to any Portacath or PICC line.   We strive to give you quality time with your provider. You may need to reschedule your appointment if you arrive late (15 or more minutes).  Arriving late affects you and other patients whose appointments are after yours.  Also, if you miss three or more appointments without notifying the office, you may be dismissed from the clinic at the provider's discretion.      For prescription refill requests, have your pharmacy contact our office and allow 72 hours for refills to be completed.    Today you received the following chemotherapy and/or immunotherapy agents: Keytruda, Alimta.       To help prevent nausea and vomiting after your treatment, we encourage you to take your nausea medication as directed.  BELOW ARE SYMPTOMS THAT SHOULD BE REPORTED IMMEDIATELY: *FEVER GREATER THAN 100.4 F (38 C) OR HIGHER *CHILLS OR SWEATING *NAUSEA AND VOMITING THAT IS NOT CONTROLLED WITH YOUR NAUSEA MEDICATION *UNUSUAL SHORTNESS OF BREATH *UNUSUAL BRUISING OR BLEEDING *URINARY PROBLEMS (pain or burning when urinating, or frequent urination) *BOWEL PROBLEMS (unusual diarrhea, constipation, pain near the anus) TENDERNESS IN MOUTH AND THROAT WITH OR WITHOUT PRESENCE OF ULCERS (sore throat, sores in mouth, or a toothache) UNUSUAL RASH, SWELLING OR PAIN  UNUSUAL VAGINAL DISCHARGE OR ITCHING   Items with * indicate a potential emergency and should be followed up as soon as possible or go to the Emergency Department if any problems should occur.  Please show the CHEMOTHERAPY ALERT CARD or IMMUNOTHERAPY ALERT CARD at  check-in to the Emergency Department and triage nurse.  Should you have questions after your visit or need to cancel or reschedule your appointment, please contact [facility]  Dept: [phone]  and follow the prompts.  Office hours are 8:00 a.m. to 4:30 p.m. Monday - Friday. Please note that voicemails left after 4:00 p.m. may not be returned until the following business day.  We are closed weekends and major holidays. You have access to a nurse at all times for urgent questions. Please call the main number to the clinic Dept: [phone] and follow the prompts.   For any non-urgent questions, you may also contact your provider using MyChart. We now offer e-Visits for anyone 46 and older to request care online for non-urgent symptoms. For details visit [email or web address].   Also download the MyChart app! Go to the app store, search "MyChart", open the app, select [facility], and log in with your MyChart username and password.  Masks are optional in the cancer centers. If you would like for your care team to wear a mask while they are taking care of you, please let them know. You may have one support person who is at least 56 years old accompany you for your appointments.

## 2022-04-12 ENCOUNTER — Other Ambulatory Visit: Payer: Self-pay

## 2022-04-15 ENCOUNTER — Other Ambulatory Visit: Payer: Self-pay

## 2022-04-16 ENCOUNTER — Other Ambulatory Visit: Payer: Self-pay

## 2022-04-24 ENCOUNTER — Other Ambulatory Visit: Payer: Self-pay | Admitting: Physician Assistant

## 2022-04-24 DIAGNOSIS — C3401 Malignant neoplasm of right main bronchus: Secondary | ICD-10-CM

## 2022-04-26 ENCOUNTER — Ambulatory Visit (HOSPITAL_COMMUNITY)
Admission: RE | Admit: 2022-04-26 | Discharge: 2022-04-26 | Disposition: A | Discharge status: Home / Self Care | Payer: BC Managed Care – PPO | Source: Ambulatory Visit | Attending: Physician Assistant | Admitting: Physician Assistant

## 2022-04-26 DIAGNOSIS — C3491 Malignant neoplasm of unspecified part of right bronchus or lung: Secondary | ICD-10-CM | POA: Insufficient documentation

## 2022-04-26 DIAGNOSIS — J90 Pleural effusion, not elsewhere classified: Secondary | ICD-10-CM | POA: Diagnosis not present

## 2022-04-26 DIAGNOSIS — C3490 Malignant neoplasm of unspecified part of unspecified bronchus or lung: Secondary | ICD-10-CM | POA: Diagnosis not present

## 2022-04-26 DIAGNOSIS — N281 Cyst of kidney, acquired: Secondary | ICD-10-CM | POA: Diagnosis not present

## 2022-04-26 MED ORDER — IOHEXOL 9 MG/ML PO SOLN
500.0000 mL | ORAL | Status: AC
  Administered 2022-04-26 (×2): 500 mL via ORAL

## 2022-04-26 MED ORDER — SODIUM CHLORIDE (PF) 0.9 % IJ SOLN
INTRAMUSCULAR | Status: AC
  Filled 2022-04-26: qty 50

## 2022-04-26 MED ORDER — IOHEXOL 9 MG/ML PO SOLN
ORAL | Status: AC
  Filled 2022-04-26: qty 1000

## 2022-04-26 MED ORDER — IOHEXOL 300 MG/ML  SOLN
100.0000 mL | Freq: Once | INTRAMUSCULAR | Status: AC | PRN
  Administered 2022-04-26: 100 mL via INTRAVENOUS

## 2022-04-26 MED ORDER — HEPARIN SOD (PORK) LOCK FLUSH 100 UNIT/ML IV SOLN
INTRAVENOUS | Status: AC
  Filled 2022-04-26: qty 5

## 2022-04-26 MED ORDER — HEPARIN SOD (PORK) LOCK FLUSH 100 UNIT/ML IV SOLN
500.0000 [IU] | Freq: Once | INTRAVENOUS | Status: AC
  Administered 2022-04-26: 500 [IU] via INTRAVENOUS

## 2022-05-01 ENCOUNTER — Inpatient Hospital Stay: Payer: BC Managed Care – PPO

## 2022-05-01 ENCOUNTER — Inpatient Hospital Stay (HOSPITAL_BASED_OUTPATIENT_CLINIC_OR_DEPARTMENT_OTHER): Payer: BC Managed Care – PPO | Admitting: Internal Medicine

## 2022-05-01 ENCOUNTER — Encounter: Payer: Self-pay | Admitting: Internal Medicine

## 2022-05-01 ENCOUNTER — Other Ambulatory Visit: Payer: Self-pay

## 2022-05-01 ENCOUNTER — Inpatient Hospital Stay: Payer: BC Managed Care – PPO | Attending: Radiation Oncology

## 2022-05-01 DIAGNOSIS — Z5112 Encounter for antineoplastic immunotherapy: Secondary | ICD-10-CM | POA: Insufficient documentation

## 2022-05-01 DIAGNOSIS — Z79899 Other long term (current) drug therapy: Secondary | ICD-10-CM | POA: Insufficient documentation

## 2022-05-01 DIAGNOSIS — C3491 Malignant neoplasm of unspecified part of right bronchus or lung: Secondary | ICD-10-CM

## 2022-05-01 DIAGNOSIS — C7971 Secondary malignant neoplasm of right adrenal gland: Secondary | ICD-10-CM | POA: Diagnosis not present

## 2022-05-01 DIAGNOSIS — C7972 Secondary malignant neoplasm of left adrenal gland: Secondary | ICD-10-CM | POA: Insufficient documentation

## 2022-05-01 DIAGNOSIS — Z5111 Encounter for antineoplastic chemotherapy: Secondary | ICD-10-CM | POA: Diagnosis not present

## 2022-05-01 LAB — CMP (CANCER CENTER ONLY)
ALT: 19 U/L (ref 0–44)
AST: 30 U/L (ref 15–41)
Albumin: 3.1 g/dL — ABNORMAL LOW (ref 3.5–5.0)
Alkaline Phosphatase: 71 U/L (ref 38–126)
Anion gap: 7 (ref 5–15)
BUN: 16 mg/dL (ref 6–20)
CO2: 24 mmol/L (ref 22–32)
Calcium: 9.3 mg/dL (ref 8.9–10.3)
Chloride: 109 mmol/L (ref 98–111)
Creatinine: 0.7 mg/dL (ref 0.44–1.00)
GFR, Estimated: >60 mL/min (ref >60)
Glucose, Bld: 93 mg/dL (ref 70–99)
Potassium: 4.2 mmol/L (ref 3.5–5.1)
Sodium: 140 mmol/L (ref 135–145)
Total Bilirubin: 0.2 mg/dL — ABNORMAL LOW (ref 0.3–1.2)
Total Protein: 6.2 g/dL — ABNORMAL LOW (ref 6.5–8.1)

## 2022-05-01 LAB — CBC WITH DIFFERENTIAL (CANCER CENTER ONLY)
Abs Immature Granulocytes: 0.03 10*3/uL (ref 0.00–0.07)
Basophils Absolute: 0.1 10*3/uL (ref 0.0–0.1)
Basophils Relative: 1 %
Eosinophils Absolute: 0.2 10*3/uL (ref 0.0–0.5)
Eosinophils Relative: 2 %
HCT: 33.3 % — ABNORMAL LOW (ref 36.0–46.0)
Hemoglobin: 11 g/dL — ABNORMAL LOW (ref 12.0–15.0)
Immature Granulocytes: 0 %
Lymphocytes Relative: 25 %
Lymphs Abs: 1.8 10*3/uL (ref 0.7–4.0)
MCH: 34 pg (ref 26.0–34.0)
MCHC: 33 g/dL (ref 30.0–36.0)
MCV: 102.8 fL — ABNORMAL HIGH (ref 80.0–100.0)
Monocytes Absolute: 1.2 10*3/uL — ABNORMAL HIGH (ref 0.1–1.0)
Monocytes Relative: 16 %
Neutro Abs: 4 10*3/uL (ref 1.7–7.7)
Neutrophils Relative %: 56 %
Platelet Count: 278 10*3/uL (ref 150–400)
RBC: 3.24 MIL/uL — ABNORMAL LOW (ref 3.87–5.11)
RDW: 15.1 % (ref 11.5–15.5)
WBC Count: 7.1 10*3/uL (ref 4.0–10.5)
nRBC: 0 % (ref 0.0–0.2)

## 2022-05-01 LAB — TSH: TSH: 1.731 u[IU]/mL (ref 0.350–4.500)

## 2022-05-01 MED ORDER — SODIUM CHLORIDE 0.9% FLUSH
10.0000 mL | Freq: Once | INTRAVENOUS | Status: AC
  Administered 2022-05-01: 10 mL via INTRAVENOUS

## 2022-05-01 MED ORDER — SODIUM CHLORIDE 0.9 % IV SOLN
500.0000 mg/m2 | Freq: Once | INTRAVENOUS | Status: AC
  Administered 2022-05-01: 700 mg via INTRAVENOUS
  Filled 2022-05-01: qty 20

## 2022-05-01 MED ORDER — SODIUM CHLORIDE 0.9% FLUSH
10.0000 mL | INTRAVENOUS | Status: DC | PRN
  Administered 2022-05-01: 10 mL

## 2022-05-01 MED ORDER — CYANOCOBALAMIN 1000 MCG/ML IJ SOLN
1000.0000 ug | Freq: Once | INTRAMUSCULAR | Status: AC
  Administered 2022-05-01: 1000 ug via INTRAMUSCULAR
  Filled 2022-05-01: qty 1

## 2022-05-01 MED ORDER — SODIUM CHLORIDE 0.9 % IV SOLN
200.0000 mg | Freq: Once | INTRAVENOUS | Status: AC
  Administered 2022-05-01: 200 mg via INTRAVENOUS
  Filled 2022-05-01: qty 200

## 2022-05-01 MED ORDER — HEPARIN SOD (PORK) LOCK FLUSH 100 UNIT/ML IV SOLN
500.0000 [IU] | Freq: Once | INTRAVENOUS | Status: AC | PRN
  Administered 2022-05-01: 500 [IU]

## 2022-05-01 MED ORDER — SODIUM CHLORIDE 0.9 % IV SOLN: Freq: Once | INTRAVENOUS | Status: AC

## 2022-05-01 MED ORDER — PROCHLORPERAZINE MALEATE 10 MG PO TABS
10.0000 mg | ORAL_TABLET | Freq: Once | ORAL | Status: AC
  Administered 2022-05-01: 10 mg via ORAL
  Filled 2022-05-01: qty 1

## 2022-05-01 NOTE — Patient Instructions (Signed)
[facility] ONCOLOGY  Discharge Instructions: Thank you for choosing [facility] to provide your oncology and hematology care.   If you have a lab appointment with the [facility], please go directly to the [facility] and check in at the registration area.   Wear comfortable clothing and clothing appropriate for easy access to any Portacath or PICC line.   We strive to give you quality time with your provider. You may need to reschedule your appointment if you arrive late (15 or more minutes).  Arriving late affects you and other patients whose appointments are after yours.  Also, if you miss three or more appointments without notifying the office, you may be dismissed from the clinic at the provider's discretion.      For prescription refill requests, have your pharmacy contact our office and allow 72 hours for refills to be completed.    Today you received the following chemotherapy and/or immunotherapy agents: pembrolizumab and pemetrexed      To help prevent nausea and vomiting after your treatment, we encourage you to take your nausea medication as directed.  BELOW ARE SYMPTOMS THAT SHOULD BE REPORTED IMMEDIATELY: *FEVER GREATER THAN 100.4 F (38 C) OR HIGHER *CHILLS OR SWEATING *NAUSEA AND VOMITING THAT IS NOT CONTROLLED WITH YOUR NAUSEA MEDICATION *UNUSUAL SHORTNESS OF BREATH *UNUSUAL BRUISING OR BLEEDING *URINARY PROBLEMS (pain or burning when urinating, or frequent urination) *BOWEL PROBLEMS (unusual diarrhea, constipation, pain near the anus) TENDERNESS IN MOUTH AND THROAT WITH OR WITHOUT PRESENCE OF ULCERS (sore throat, sores in mouth, or a toothache) UNUSUAL RASH, SWELLING OR PAIN  UNUSUAL VAGINAL DISCHARGE OR ITCHING   Items with * indicate a potential emergency and should be followed up as soon as possible or go to the Emergency Department if any problems should occur.  Please show the CHEMOTHERAPY ALERT CARD or IMMUNOTHERAPY ALERT  CARD at check-in to the Emergency Department and triage nurse.  Should you have questions after your visit or need to cancel or reschedule your appointment, please contact [facility]  Dept: [phone]  and follow the prompts.  Office hours are 8:00 a.m. to 4:30 p.m. Monday - Friday. Please note that voicemails left after 4:00 p.m. may not be returned until the following business day.  We are closed weekends and major holidays. You have access to a nurse at all times for urgent questions. Please call the main number to the clinic Dept: [phone] and follow the prompts.   For any non-urgent questions, you may also contact your provider using MyChart. We now offer e-Visits for anyone 21 and older to request care online for non-urgent symptoms. For details visit [email or web address].   Also download the MyChart app! Go to the app store, search "MyChart", open the app, select [facility], and log in with your MyChart username and password.  Masks are optional in the cancer centers. If you would like for your care team to wear a mask while they are taking care of you, please let them know. You may have one support person who is at least 56 years old accompany you for your appointments.

## 2022-05-01 NOTE — Progress Notes (Signed)
[facility] [phone]   [phone]  OFFICE PROGRESS NOTE  [name], FNP [address] [place] [address]  DIAGNOSIS: Stage IV (T2b, N2, M1 C) non-small cell lung cancer, adenocarcinoma presented with right hilar mass in addition to mediastinal and upper abdominal lymphadenopathy in addition to bilateral adrenal metastases and metastatic disease in the musculature of the lower back.  This was diagnosed in April 2023.  DETECTED ALTERATION(S) / BIOMARKER(S) % CFDNA OR AMPLIFICATION ASSOCIATED FDA-APPROVED THERAPIES CLINICAL TRIAL AVAILABILITY [id number] 0.5% None  Yes [id number] (Splice Site Indel) 4.0% None  Yes  PD-L1 expression 0%  PRIOR THERAPY: Palliative radiotherapy to the right hilar region under the care of Dr. Lisbeth Renshaw.  CURRENT THERAPY:  systemic chemotherapy with carboplatin for AUC of 5, Alimta 500 Mg/M2 and Keytruda 200 Mg IV every 3 weeks.  First dose August 29, 2021.  Status post 11 cycles.  Starting from cycle #5 the patient is on maintenance treatment with Alimta and Keytruda every 3 weeks.  INTERVAL HISTORY: April Walsh 56 y.o. female returns to the clinic today for follow-up visit accompanied by friend.  The patient is feeling fine today with no concerning complaints except for pulling a muscle when she was trying to vacuum.  She denied having any current chest pain, shortness of breath, cough or hemoptysis.  She has no nausea, vomiting, diarrhea or constipation.  She denied having any headache or visual changes.  She has no recent weight loss or night sweats.  She continues to tolerate her maintenance treatment with Alimta and Keytruda fairly well.  The patient had repeat CT scan of the chest, abdomen and pelvis performed recently and she is here for evaluation and discussion of her scan results.   MEDICAL HISTORY: Past Medical History:  Diagnosis Date   Anemia    Asthma    Bronchitis    Cancer  ([place])    basal cell melanoma, cervical cancer   COPD (chronic obstructive pulmonary disease) ([place])    "early stages of COPD"   Headache    Lung cancer ([place]) 08/13/2021    ALLERGIES:  has No Known Allergies.  MEDICATIONS:  Current Outpatient Medications  Medication Sig Dispense Refill   apixaban (ELIQUIS) 2.5 MG TABS tablet Take 1 tablet (2.5 mg total) by mouth 2 (two) times daily. 60 tablet 0   folic acid (FOLVITE) 1 MG tablet TAKE 1 TABLET BY MOUTH EVERY DAY 30 tablet 2   lidocaine-prilocaine (EMLA) cream Apply 1 Application topically as needed. 30 g 2   triamcinolone cream (KENALOG) 0.1 % Apply 1 application  topically 2 (two) times daily as needed. 80 g 0   Budeson-Glycopyrrol-Formoterol (BREZTRI AEROSPHERE) 160-9-4.8 MCG/ACT AERO Inhale 2 puffs into the lungs in the morning and at bedtime. 10.7 g 11   prochlorperazine (COMPAZINE) 10 MG tablet Take 1 tablet (10 mg total) by mouth every 6 (six) hours as needed. (Patient not taking: Reported on 05/01/2022) 30 tablet 2   No current facility-administered medications for this visit.   Facility-Administered Medications Ordered in Other Visits  Medication Dose Route Frequency Provider Last Rate Last Admin   cyanocobalamin (VITAMIN B12) 1000 MCG/ML injection            prochlorperazine (COMPAZINE) 10 MG tablet             SURGICAL HISTORY:  Past Surgical History:  Procedure Laterality Date   APPENDECTOMY     1996   BREAST BIOPSY Right  [id number]   BRONCHIAL BIOPSY  08/13/2021   Procedure: BRONCHIAL BIOPSIES;  Surgeon: Collene Gobble, MD;  Location: [facility] ENDOSCOPY;  Service: Pulmonary;;   BRONCHIAL BRUSHINGS  08/13/2021   Procedure: BRONCHIAL BRUSHINGS;  Surgeon: Collene Gobble, MD;  Location[facility] ENDOSCOPY;  Service: Pulmonary;;   BRONCHIAL NEEDLE ASPIRATION BIOPSY  08/13/2021   Procedure: BRONCHIAL NEEDLE ASPIRATION BIOPSIES;  Surgeon: Collene Gobble, MD;  Location: [facility] ENDOSCOPY;  Service: Pulmonary;;   CESAREAN SECTION     1984,  1987   FOOT SURGERY Right    2 pins and screw 1999   FOOT SURGERY Left 06/21/2021   screw and 2 pins   HEMOSTASIS CONTROL  08/13/2021   Procedure: HEMOSTASIS CONTROL;  Surgeon: Collene Gobble, MD;  Location: [facility] ENDOSCOPY;  Service: Pulmonary;;   IR IMAGING GUIDED PORT INSERTION  11/26/2021   MELANOMA EXCISION Left    basal cell melanoma 2011   [place]   VIDEO BRONCHOSCOPY  08/13/2021   Procedure: VIDEO BRONCHOSCOPY WITHOUT FLUORO;  Surgeon: Collene Gobble, MD;  Location: [facility] ENDOSCOPY;  Service: Pulmonary;;   VIDEO BRONCHOSCOPY WITH ENDOBRONCHIAL ULTRASOUND N/A 08/13/2021   Procedure: VIDEO BRONCHOSCOPY WITH ENDOBRONCHIAL ULTRASOUND;  Surgeon: Collene Gobble, MD;  Location: [facility] ENDOSCOPY;  Service: Pulmonary;  Laterality: N/A;    REVIEW OF SYSTEMS:  Constitutional: positive for fatigue Eyes: negative Ears, nose, mouth, throat, and face: negative Respiratory: negative Cardiovascular: negative Gastrointestinal: negative Genitourinary:negative Integument/breast: negative Hematologic/lymphatic: negative Musculoskeletal:negative Neurological: negative Behavioral/Psych: negative Endocrine: negative Allergic/Immunologic: negative   PHYSICAL EXAMINATION: General appearance: alert, cooperative, and no distress Head: Normocephalic, without obvious abnormality, atraumatic Neck: no adenopathy, no JVD, supple, symmetrical, trachea midline, and thyroid not enlarged, symmetric, no tenderness/mass/nodules Lymph nodes: Cervical, supraclavicular, and axillary nodes normal. Resp: clear to auscultation bilaterally Back: symmetric, no curvature. ROM normal. No CVA tenderness. Cardio: regular rate and rhythm, S1, S2 normal, no murmur, click, rub or gallop GI: soft, non-tender; bowel sounds normal; no masses,  no organomegaly Extremities: extremities normal, atraumatic, no cyanosis or edema Neurologic: Alert and oriented X 3, normal strength and tone.  Normal symmetric reflexes. Normal coordination and gait  ECOG PERFORMANCE STATUS: 1 - Symptomatic but completely ambulatory  Blood pressure 99/66, pulse 88, temperature 98.5 F (36.9 C), temperature source Temporal, resp. rate 17, weight 115 lb 3 oz (52.2 kg), SpO2 99 %.  LABORATORY DATA: Lab Results  Component Value Date   WBC 7.1 05/01/2022   HGB 11.0 (L) 05/01/2022   HCT 33.3 (L) 05/01/2022   MCV 102.8 (H) 05/01/2022   PLT 278 05/01/2022      Chemistry      Component Value Date/Time   NA 140 05/01/2022 0821   K 4.2 05/01/2022 0821   CL 109 05/01/2022 0821   CO2 24 05/01/2022 0821   BUN 16 05/01/2022 0821   CREATININE 0.70 05/01/2022 0821      Component Value Date/Time   CALCIUM 9.3 05/01/2022 0821   ALKPHOS 71 05/01/2022 0821   AST 30 05/01/2022 0821   ALT 19 05/01/2022 0821   BILITOT 0.2 (L) 05/01/2022 0821       RADIOGRAPHIC STUDIES: CT Chest W Contrast  Result Date: 04/28/2022 CLINICAL DATA:  Stage IV non-small cell right lung cancer diagnosed April 2023 status post palliative right hilar radiotherapy with ongoing chemoimmunotherapy. Restaging. * Tracking Code: BO * EXAM: CT CHEST, ABDOMEN, AND PELVIS WITH CONTRAST TECHNIQUE: Multidetector CT imaging  of the chest, abdomen and pelvis was performed following the standard protocol during bolus administration of intravenous contrast. RADIATION DOSE REDUCTION: This exam was performed according to the departmental dose-optimization program which includes automated exposure control, adjustment of the mA and/or kV according to patient size and/or use of iterative reconstruction technique. CONTRAST:  181m OMNIPAQUE IOHEXOL 300 MG/ML  SOLN COMPARISON:  01/15/2022 CT chest, abdomen and pelvis. FINDINGS: CT CHEST FINDINGS Cardiovascular: Normal heart size. No significant pericardial effusion/thickening. Right internal jugular Port-A-Cath terminates at the cavoatrial junction. Atherosclerotic nonaneurysmal thoracic aorta. Normal  caliber pulmonary arteries. No central pulmonary emboli. Mediastinum/Nodes: No significant thyroid nodules. Unremarkable esophagus. No axillary adenopathy. No pathologically enlarged mediastinal nodes. No left hilar adenopathy. Soft tissue thickening in right hilum measures 0.9 cm (series 2/image 26), previously 0.9 cm, unchanged. Lungs/Pleura: No pneumothorax. New trace dependent right pleural effusion. No left pleural effusion. Patchy indistinct peribronchovascular consolidation and ground-glass opacity throughout the right perihilar region including the right upper lobe, right middle lobe and superior segment right lower lobe, increased. No new significant discrete pulmonary nodules. Musculoskeletal: No aggressive appearing focal osseous lesions. Mild thoracic spondylosis. CT ABDOMEN PELVIS FINDINGS Hepatobiliary: Normal liver with no liver mass. Normal gallbladder with no radiopaque cholelithiasis. No biliary ductal dilatation. Pancreas: Normal, with no mass or duct dilation. Spleen: Normal size. No mass. Adrenals/Urinary Tract: Stable adrenal glands without recurrent adrenal nodules. Simple 3.1 cm lower left renal cyst, for which no follow-up imaging is recommended. Normal bladder. Stomach/Bowel: Normal non-distended stomach. Normal caliber small bowel with no small bowel wall thickening. Appendectomy. Oral contrast transits to the left colon. Normal large bowel with no diverticulosis, large bowel wall thickening or pericolonic fat stranding. Vascular/Lymphatic: Atherosclerotic nonaneurysmal abdominal aorta. Patent portal, splenic, hepatic and renal veins. No pathologically enlarged lymph nodes in the abdomen or pelvis. Reproductive: Status post hysterectomy, with no abnormal findings at the vaginal cuff. No adnexal mass. Other: No pneumoperitoneum, ascites or focal fluid collection. Musculoskeletal: No aggressive appearing focal osseous lesions. Mild-to-moderate lumbar spondylosis, most prominent at L5-S1.  IMPRESSION: 1. Patchy indistinct peribronchovascular consolidation and ground-glass opacity throughout the right perihilar region, increased. New trace dependent right pleural effusion. These findings are nonspecific but favor evolving postradiation change. Recommend attention on chest CT follow-up in 3-6 months. 2. Stable mild soft tissue thickening in the right hilum, compatible with treated disease. 3. No findings highly suspicious for recurrent metastatic disease in the chest. No evidence of metastatic disease in the abdomen or pelvis. 4.  Aortic Atherosclerosis (ICD10-I70.0). Electronically Signed   By: JIlona SorrelM.D.   On: 04/28/2022 17:20   CT Abdomen Pelvis W Contrast  Result Date: 04/28/2022 CLINICAL DATA:  Stage IV non-small cell right lung cancer diagnosed April 2023 status post palliative right hilar radiotherapy with ongoing chemoimmunotherapy. Restaging. * Tracking Code: BO * EXAM: CT CHEST, ABDOMEN, AND PELVIS WITH CONTRAST TECHNIQUE: Multidetector CT imaging of the chest, abdomen and pelvis was performed following the standard protocol during bolus administration of intravenous contrast. RADIATION DOSE REDUCTION: This exam was performed according to the departmental dose-optimization program which includes automated exposure control, adjustment of the mA and/or kV according to patient size and/or use of iterative reconstruction technique. CONTRAST:  1030mOMNIPAQUE IOHEXOL 300 MG/ML  SOLN COMPARISON:  01/15/2022 CT chest, abdomen and pelvis. FINDINGS: CT CHEST FINDINGS Cardiovascular: Normal heart size. No significant pericardial effusion/thickening. Right internal jugular Port-A-Cath terminates at the cavoatrial junction. Atherosclerotic nonaneurysmal thoracic aorta. Normal caliber pulmonary arteries. No central pulmonary emboli. Mediastinum/Nodes: No significant thyroid  nodules. Unremarkable esophagus. No axillary adenopathy. No pathologically enlarged mediastinal nodes. No left hilar  adenopathy. Soft tissue thickening in right hilum measures 0.9 cm (series 2/image 26), previously 0.9 cm, unchanged. Lungs/Pleura: No pneumothorax. New trace dependent right pleural effusion. No left pleural effusion. Patchy indistinct peribronchovascular consolidation and ground-glass opacity throughout the right perihilar region including the right upper lobe, right middle lobe and superior segment right lower lobe, increased. No new significant discrete pulmonary nodules. Musculoskeletal: No aggressive appearing focal osseous lesions. Mild thoracic spondylosis. CT ABDOMEN PELVIS FINDINGS Hepatobiliary: Normal liver with no liver mass. Normal gallbladder with no radiopaque cholelithiasis. No biliary ductal dilatation. Pancreas: Normal, with no mass or duct dilation. Spleen: Normal size. No mass. Adrenals/Urinary Tract: Stable adrenal glands without recurrent adrenal nodules. Simple 3.1 cm lower left renal cyst, for which no follow-up imaging is recommended. Normal bladder. Stomach/Bowel: Normal non-distended stomach. Normal caliber small bowel with no small bowel wall thickening. Appendectomy. Oral contrast transits to the left colon. Normal large bowel with no diverticulosis, large bowel wall thickening or pericolonic fat stranding. Vascular/Lymphatic: Atherosclerotic nonaneurysmal abdominal aorta. Patent portal, splenic, hepatic and renal veins. No pathologically enlarged lymph nodes in the abdomen or pelvis. Reproductive: Status post hysterectomy, with no abnormal findings at the vaginal cuff. No adnexal mass. Other: No pneumoperitoneum, ascites or focal fluid collection. Musculoskeletal: No aggressive appearing focal osseous lesions. Mild-to-moderate lumbar spondylosis, most prominent at L5-S1. IMPRESSION: 1. Patchy indistinct peribronchovascular consolidation and ground-glass opacity throughout the right perihilar region, increased. New trace dependent right pleural effusion. These findings are nonspecific  but favor evolving postradiation change. Recommend attention on chest CT follow-up in 3-6 months. 2. Stable mild soft tissue thickening in the right hilum, compatible with treated disease. 3. No findings highly suspicious for recurrent metastatic disease in the chest. No evidence of metastatic disease in the abdomen or pelvis. 4.  Aortic Atherosclerosis (ICD10-I70.0). Electronically Signed   By: Ilona Sorrel M.D.   On: 04/28/2022 17:20    ASSESSMENT AND PLAN: This is a very pleasant 56 years old white female with Stage IV (T2b, N2, M1 C) non-small cell lung cancer, adenocarcinoma presented with right hilar mass in addition to mediastinal and upper abdominal lymphadenopathy in addition to bilateral adrenal metastases and metastatic disease in the musculature of the lower back.  This was diagnosed in April 2023. The molecular studies by [employer] showed no actionable mutations and the patient has negative PD-L1 expression. She is currently undergoing palliative radiotherapy to the right hilar region under the care of Dr. Lisbeth Renshaw. The patient is currently on systemic chemotherapy started with carboplatin for AUC of 5, Alimta 500 Mg/M2 and Keytruda 200 Mg IV every 3 weeks and starting from cycle #5 she is on maintenance treatment with Alimta and Keytruda every 3 weeks.  Status post a total of 11 cycles. The patient has been tolerating this treatment fairly well with no concerning adverse effects. She had repeat CT scan of the chest, abdomen and pelvis performed recently.  I personally and independently reviewed the scan and discussed the results with the patient today. Her scan showed no concerning findings for disease progression but there was patchy peribronchovascular consolidation and groundglass opacities throughout the right perihilar region that increased and need close monitoring. I recommended for the patient to continue her current treatment with maintenance Alimta and Keytruda today as planned. I  will see her back for follow-up visit in 3 weeks for evaluation before the next cycle of her treatment. She was advised to call  immediately if she has any other concerning symptoms in the interval. The patient voices understanding of current disease status and treatment options and is in agreement with the current care plan.  All questions were answered. The patient knows to call the clinic with any problems, questions or concerns. We can certainly see the patient much sooner if necessary.  The total time spent in the appointment was 30 minutes.  Disclaimer: This note was dictated with voice recognition software. Similar sounding words can inadvertently be transcribed and may not be corrected upon review.

## 2022-05-02 LAB — T4: T4, Total: 7.2 ug/dL (ref 4.5–12.0)

## 2022-05-07 ENCOUNTER — Encounter: Payer: Self-pay | Admitting: Neurology

## 2022-05-07 ENCOUNTER — Ambulatory Visit (INDEPENDENT_AMBULATORY_CARE_PROVIDER_SITE_OTHER): Payer: BC Managed Care – PPO | Admitting: Neurology

## 2022-05-07 VITALS — BP 109/74 | HR 113 | Ht 60.0 in | Wt 107.0 lb

## 2022-05-07 DIAGNOSIS — Z8673 Personal history of transient ischemic attack (TIA), and cerebral infarction without residual deficits: Secondary | ICD-10-CM

## 2022-05-07 NOTE — Patient Instructions (Signed)
I had a long d/w patient about his remote stroke, adenocarcinoma related to upper approximately, risk for recurrent stroke/TIAs, personally independently reviewed imaging studies and stroke evaluation results and answered questions.Continue Eliquis (apixaban) 2.5 mg twice daily  for secondary stroke prevention and maintain strict control of hypertension with blood pressure goal below 130/90, diabetes with hemoglobin A1c goal below 6.5% and lipids with LDL cholesterol goal below 70 mg/dL. I also advised the patient to eat a healthy diet with plenty of whole grains, cereals, fruits and vegetables, exercise regularly and maintain ideal body weight.  Check lipid profile today and if LDL is elevated may need to start statin.  Followup in the future with me in 1 year or call earlier if necessary.

## 2022-05-07 NOTE — Progress Notes (Signed)
[facility] [address] [place]. [place] [address] [phone]       OFFICE FOLLOW-UP VISIT NOTE  Ms. April Walsh Date of Birth:  04-04-1966 Medical Record Number:  [id number]   Referring MD: Zeb Comfort Reason for Referral: Stroke   HPI: Initial visit 12/06/2021 Ms. April Walsh is a 56 year old pleasant Caucasian lady seen today for initial office consultation visit for stroke.  She is accompanied by her aunt today.  History is obtained from them and review of electronic medical records and I have personally reviewed pertinent available imaging films in PACS.  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  She woke up on 09/11/2021 and noticed weakness in the left upper extremity mostly as well as some slurred speech and facial droop.  She presented outside time window for thrombolysis.  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  MRI scan of the brain showed small patchy acute right MCA territory infarcts involving the insular as well as posterior frontal and parietal region.  There was also a tiny punctate acute left frontal lobe infarct in addition.  2D echo showed ejection fraction of 60 to 65% without cardiac source of embolism.  Urine drug screen was positive for cannabis.  LDL cholesterol was 87 mg percent and hemoglobin A1c was 4.7.  The etiology of patient's stroke was felt to be hypercoagulability from her metastatic adenocarcinoma and she was started on Eliquis.  She denies any prior history of deep vein thrombosis, pulmonary embolism.  She had no prior history of strokes,  TIAs, seizures, migraines or other neurological problems.  She is followed by Dr. Julien Nordmann at the oncology center and has just finished 2-week course of radiation and is currently on chemotherapy.  Patient states her left hand weakness appears to have improved she still has minimum diminished fine motor skills and some numbness in the hand but otherwise is almost back to baseline.  Her slurred speech and facial droop has recovered completely.  She is tolerating Eliquis well without bleeding or bruising.  She is also tolerating Lipitor well without muscle aches and pains but has not had any follow-up lipid profile checked yet.  She has no complaints today. Update 05/07/2022 : She returns for follow-up after last visit 5 months ago.  She is doing well.  She has not had recurrent stroke or TIA symptoms.  She remains on Eliquis 2.5 mg twice daily which is tolerating well without bruising or bleeding.  Patient was supposed to have follow-up lipid profile at last visit for for unclear reasons that has not been done.  Continues to be on chemotherapy every 3 weeks and Keytruda and sees Dr. Julien Nordmann at the cancer center.  He has no new complaints today. ROS:   14 system review of systems is positive for weakness, numbness, slurred speech all other systems negative  PMH:  Past Medical History:  Diagnosis Date   Anemia    Asthma    Bronchitis    Cancer ([place])    basal cell melanoma, cervical cancer   COPD (chronic obstructive pulmonary disease) ([place])    "early stages of COPD"   Headache  Lung cancer ([place]) 08/13/2021    Social History:  Social History   Socioeconomic History   Marital status: Married    Spouse name: Not on file   Number of children: Not on file   Years of education: Not on file   Highest education level: Not on file  Occupational History   Not on file  Tobacco Use   Smoking status: Former    Packs/day: 0.50    Years: 32.00    Total pack years: 16.00    Types: Cigarettes     Passive exposure: Past   Smokeless tobacco: Never   Tobacco comments:    1/2 pack smoked daily [facility], 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  Vaping Use   Vaping Use: Never used  Substance and Sexual Activity   Alcohol use: Never   Drug use: Never   Sexual activity: Not on file  Other Topics Concern   Not on file  Social History Narrative   Not on file   Social Determinants of Health   Financial Resource Strain: Not on file  Food Insecurity: Not on file  Transportation Needs: Not on file  Physical Activity: Not on file  Stress: Not on file  Social Connections: Not on file  Intimate Partner Violence: Not on file    Medications:   Current Outpatient Medications on File Prior to Visit  Medication Sig Dispense Refill   apixaban (ELIQUIS) 2.5 MG TABS tablet Take 1 tablet (2.5 mg total) by mouth 2 (two) times daily. 60 tablet 0   Budeson-Glycopyrrol-Formoterol (BREZTRI AEROSPHERE) 160-9-4.8 MCG/ACT AERO Inhale 2 puffs into the lungs in the morning and at bedtime. 01.0 g 11   folic acid (FOLVITE) 1 MG tablet TAKE 1 TABLET BY MOUTH EVERY DAY 30 tablet 2   lidocaine-prilocaine (EMLA) cream Apply 1 Application topically as needed. 30 g 2   prochlorperazine (COMPAZINE) 10 MG tablet Take 1 tablet (10 mg total) by mouth every 6 (six) hours as needed. 30 tablet 2   Current Facility-Administered Medications on File Prior to Visit  Medication Dose Route Frequency Provider Last Rate Last Admin   cyanocobalamin (VITAMIN B12) 1000 MCG/ML injection            prochlorperazine (COMPAZINE) 10 MG tablet             Allergies:  No Known Allergies  Physical Exam General: Frail cachectic malnourished looking middle-aged Caucasian lady, seated, in no evident distress Head: head normocephalic and atraumatic.   Neck: supple with no carotid or supraclavicular bruits Cardiovascular: regular rate and rhythm, no murmurs Musculoskeletal: no deformity Skin:  no rash/petichiae Vascular:  Normal pulses all extremities  Neurologic  Exam Mental Status: Awake and fully alert. Oriented to place and time. Recent and remote memory intact. Attention span, concentration and fund of knowledge appropriate. Mood and affect appropriate.  Cranial Nerves: Fundoscopic exam reveals sharp disc margins. Pupils equal, briskly reactive to light. Extraocular movements full without nystagmus. Visual fields full to confrontation. Hearing intact. Facial sensation intact. Face, tongue, palate moves normally and symmetrically.  Motor: Normal bulk and tone. Normal strength in all tested extremity muscles.  Diminished fine finger movements on the left.  Orbits right over left upper extremity. Sensory.: intact to touch , pinprick , position and vibratory sensation.  Coordination: Rapid alternating movements normal in all extremities. Finger-to-nose and heel-to-shin performed accurately bilaterally. Gait and Station: Arises from chair without difficulty. Stance is normal. Gait demonstrates normal stride length and balance . Able to heel, toe and tandem walk without difficulty.  Reflexes: 1+ and symmetric. Toes downgoing.   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  Patient is doing well with very minimal residual deficits.        PLAN:I had a long d/w patient about his remote stroke, adenocarcinoma related to upper approximately, risk for recurrent stroke/TIAs, personally independently reviewed imaging studies and stroke evaluation results and answered questions.Continue Eliquis (apixaban) 2.5 mg twice daily  for secondary stroke prevention and maintain strict control of hypertension with blood pressure goal below 130/90, diabetes with hemoglobin A1c goal below 6.5% and lipids with LDL cholesterol goal below 70 mg/dL. I also advised the patient to eat a healthy diet with plenty of whole grains,  cereals, fruits and vegetables, exercise regularly and maintain ideal body weight.  Check lipid profile today and if LDL is elevated may need to start statin.  Followup in the future with me in 1 year or call earlier if necessary.Greater than 50% time during this 35-minute  visit was spent on counseling and coordination of care about her stroke and discussion about hypercoagulability from cancer and treatment and answering questions. Antony Contras, MD  Note: This document was prepared with digital dictation and possible smart phrase technology. Any transcriptional errors that result from this process are unintentional.

## 2022-05-08 ENCOUNTER — Other Ambulatory Visit: Payer: Self-pay

## 2022-05-08 LAB — LIPID PANEL
Chol/HDL Ratio: 4.8 ratio — ABNORMAL HIGH (ref 0.0–4.4)
Cholesterol, Total: 211 mg/dL — ABNORMAL HIGH (ref 100–199)
HDL: 44 mg/dL (ref >39)
LDL Chol Calc (NIH): 134 mg/dL — ABNORMAL HIGH (ref 0–99)
Triglycerides: 184 mg/dL — ABNORMAL HIGH (ref 0–149)
VLDL Cholesterol Cal: 33 mg/dL (ref 5–40)

## 2022-05-09 ENCOUNTER — Other Ambulatory Visit: Payer: Self-pay | Admitting: Neurology

## 2022-05-09 MED ORDER — ATORVASTATIN CALCIUM 80 MG PO TABS: 80.0000 mg | ORAL_TABLET | Freq: Every day | ORAL | 3 refills | Status: DC

## 2022-05-09 NOTE — Progress Notes (Signed)
Kindly inform the patient that her cholesterol profile is not satisfactory and I have prescribed Lipitor 80 mg daily and she should pick it up at the pharmacy.  She will need follow-up lipid profile to be checked in 2 to 3 months by her primary care physician.

## 2022-05-14 ENCOUNTER — Telehealth: Payer: Self-pay

## 2022-05-14 NOTE — Telephone Encounter (Signed)
-----   Message from Garvin Fila, MD sent at 05/09/2022  7:56 PM EST ----- Kindly inform the patient that her cholesterol profile is not satisfactory and I have prescribed Lipitor 80 mg daily and she should pick it up at the pharmacy.  She will need follow-up lipid profile to be checked in 2 to 3 months by her primary care physician.

## 2022-05-14 NOTE — Telephone Encounter (Signed)
Called and spoke to patient about results and new medication prescription. Pt verbalized understanding. Pt had no questions at this time but was encouraged to call back if questions arise.

## 2022-05-19 NOTE — Progress Notes (Signed)
[facility] OFFICE PROGRESS NOTE  [name], FNP [address] [place] [address]  DIAGNOSIS: Stage IV (T2b, N2, M1 C) non-small cell lung cancer, adenocarcinoma presented with right hilar mass in addition to mediastinal and upper abdominal lymphadenopathy in addition to bilateral adrenal metastases and metastatic disease in the musculature of the lower back.  This was diagnosed in April 2023.   DETECTED ALTERATION(S) / BIOMARKER(S)     % CFDNA OR AMPLIFICATION        ASSOCIATED FDA-APPROVED THERAPIES         CLINICAL TRIAL AVAILABILITY [id number] 0.5% None     Yes [id number] (Splice Site Indel) 0.6% None     Yes   PD-L1 expression 0%  PRIOR THERAPY: Palliative radiotherapy to the right hilar region under the care of Dr. Mitzi Hansen.     CURRENT THERAPY: Systemic chemotherapy with carboplatin for AUC of 5, Alimta 500 Mg/M2 and Keytruda 200 Mg IV every 3 weeks.  First dose August 29, 2021. Status post 12 cycles.  Starting from cycle #5, the patient started maintenance Alimta and Keytruda.    INTERVAL HISTORY: April Walsh 57 y.o. female returns to the clinic today for a follow-up visit accompanied by her aunt. The patient is feeling fairly well today without any concerning complaints. The patient is currently undergoing chemotherapy and immunotherapy.  She denies any fever, chills, or night sweats.  She gained some weight and her appetite fluctuates.  She has a poor appetite a couple days after treatment and then her appetite improves and she is able to eat what ever she would like without any limitation.  Denies any changes in her breathing. Denies changes in her baseline dyspnea on exertion.  Overall, she states that her breathing is significantly better from when she was first diagnosed on her first day at the clinic.  Denies significant cough. She denies any chest pain or hemoptysis. She have to take 1-2 Compazine's for nausea after treatment. Denies  any vomiting, diarrhea, or constipation.  Denies any headache or visual changes.  She denies any rashes or skin changes.  She is here today for evaluation and repeat blood work before starting cycle #13.       MEDICAL HISTORY: Past Medical History:  Diagnosis Date   Anemia    Asthma    Bronchitis    Cancer (HCC)    basal cell melanoma, cervical cancer   COPD (chronic obstructive pulmonary disease) (HCC)    "early stages of COPD"   Headache    Lung cancer (HCC) 08/13/2021    ALLERGIES:  has No Known Allergies.  MEDICATIONS:  Current Outpatient Medications  Medication Sig Dispense Refill   atorvastatin (LIPITOR) 80 MG tablet Take 1 tablet (80 mg total) by mouth daily. 30 tablet 3   apixaban (ELIQUIS) 2.5 MG TABS tablet Take 1 tablet (2.5 mg total) by mouth 2 (two) times daily. 60 tablet 0   Budeson-Glycopyrrol-Formoterol (BREZTRI AEROSPHERE) 160-9-4.8 MCG/ACT AERO Inhale 2 puffs into the lungs in the morning and at bedtime. 10.7 g 11   folic acid (FOLVITE) 1 MG tablet TAKE 1 TABLET BY MOUTH EVERY DAY 30 tablet 2   lidocaine-prilocaine (EMLA) cream Apply 1 Application topically as needed. 30 g 2   prochlorperazine (COMPAZINE) 10 MG tablet Take 1 tablet (10 mg total) by mouth every 6 (six) hours as needed. 30 tablet 2   No current facility-administered medications for this visit.   Facility-Administered Medications Ordered in Other Visits  Medication  Dose Route Frequency Provider Last Rate Last Admin   cyanocobalamin (VITAMIN B12) 1000 MCG/ML injection            pembrolizumab (KEYTRUDA) 200 mg in sodium chloride 0.9 % 50 mL chemo infusion  200 mg Intravenous Once Si Gaul, MD       PEMEtrexed (ALIMTA) 700 mg in sodium chloride 0.9 % 100 mL chemo infusion  500 mg/m2 (Treatment Plan Recorded) Intravenous Once Si Gaul, MD       prochlorperazine (COMPAZINE) 10 MG tablet            prochlorperazine (COMPAZINE) tablet 10 mg  10 mg Oral Once Si Gaul, MD         SURGICAL HISTORY:  Past Surgical History:  Procedure Laterality Date   APPENDECTOMY     1996   BREAST BIOPSY Right    2010-2015   BRONCHIAL BIOPSY  08/13/2021   Procedure: BRONCHIAL BIOPSIES;  Surgeon: Leslye Peer, MD;  Location: [facility] ENDOSCOPY;  Service: Pulmonary;;   BRONCHIAL BRUSHINGS  08/13/2021   Procedure: BRONCHIAL BRUSHINGS;  Surgeon: Leslye Peer, MD;  Location: [facility] ENDOSCOPY;  Service: Pulmonary;;   BRONCHIAL NEEDLE ASPIRATION BIOPSY  08/13/2021   Procedure: BRONCHIAL NEEDLE ASPIRATION BIOPSIES;  Surgeon: Leslye Peer, MD;  Location: [facility] ENDOSCOPY;  Service: Pulmonary;;   CESAREAN SECTION     1984, 1987   FOOT SURGERY Right    2 pins and screw 1999   FOOT SURGERY Left 06/21/2021   screw and 2 pins   HEMOSTASIS CONTROL  08/13/2021   Procedure: HEMOSTASIS CONTROL;  Surgeon: Leslye Peer, MD;  Location: [facility] ENDOSCOPY;  Service: Pulmonary;;   IR IMAGING GUIDED PORT INSERTION  11/26/2021   MELANOMA EXCISION Left    basal cell melanoma 2011   PARTIAL HYSTERECTOMY     1994   TUBAL LIGATION     1987   VIDEO BRONCHOSCOPY  08/13/2021   Procedure: VIDEO BRONCHOSCOPY WITHOUT FLUORO;  Surgeon: Leslye Peer, MD;  Location: [facility] ENDOSCOPY;  Service: Pulmonary;;   VIDEO BRONCHOSCOPY WITH ENDOBRONCHIAL ULTRASOUND N/A 08/13/2021   Procedure: VIDEO BRONCHOSCOPY WITH ENDOBRONCHIAL ULTRASOUND;  Surgeon: Leslye Peer, MD;  Location: [facility] ENDOSCOPY;  Service: Pulmonary;  Laterality: N/A;    REVIEW OF SYSTEMS:   Review of Systems  Constitutional: Negative for appetite change, chills, fatigue, and fever HENT: Negative for mouth sores, nosebleeds, sore throat and trouble swallowing.   Eyes: Negative for eye problems and icterus.  Respiratory: Positive for improving dyspnea with activities. Negative for hemoptysis and wheezing.   Cardiovascular: Negative for chest pain and leg swelling.  Gastrointestinal: Negative for abdominal pain, nausea, constipation, diarrhea, and vomiting.   Genitourinary: Negative for bladder incontinence, difficulty urinating, dysuria, frequency and hematuria.   Musculoskeletal: Negative for back pain, gait problem, neck pain and neck stiffness.  Skin:Negative for rash or skin changes.  Neurological: Negative for dizziness, extremity weakness, gait problem, headaches, light-headedness and seizures.  Hematological: Negative for adenopathy. Does not bruise/bleed easily.  Psychiatric/Behavioral: Negative for confusion, depression and sleep disturbance. The patient is not nervous/anxious.    PHYSICAL EXAMINATION:  Blood pressure (!) 105/58, pulse 98, temperature 97.7 F (36.5 C), temperature source Oral, resp. rate 14, height 5' (1.524 m), weight 111 lb 14.4 oz (50.8 kg), SpO2 98 %.  ECOG PERFORMANCE STATUS: 1  Physical Exam  Constitutional: Oriented to person, place, and time and positive for thin appearing female and in no acute distress.   HENT:  Head: Normocephalic and atraumatic.  Mouth/Throat:  Oropharynx is clear and moist. No oropharyngeal exudate.  Eyes: Conjunctivae are normal. Right eye exhibits no discharge. Left eye exhibits no discharge. No scleral icterus.  Neck: Normal range of motion. Neck supple.  Cardiovascular: Normal rate, regular rhythm, normal heart sounds and intact distal pulses.   Pulmonary/Chest: Effort normal and breath sounds normal. No respiratory distress. No wheezes. No rales.  Abdominal: Soft. Bowel sounds are normal. Exhibits no distension and no mass. There is no tenderness.  Musculoskeletal: Normal range of motion. Exhibits no edema.  Lymphadenopathy:    No cervical adenopathy.  Neurological: Alert and oriented to person, place, and time. Exhibits normal muscle tone. Gait normal. Coordination normal.  Skin: Not diaphoretic. No erythema. No pallor.  Psychiatric: Mood, memory and judgment normal.  Vitals reviewed.    LABORATORY DATA: Lab Results  Component Value Date   WBC 7.1 05/22/2022   HGB 10.9  (L) 05/22/2022   HCT 32.6 (L) 05/22/2022   MCV 102.8 (H) 05/22/2022   PLT 248 05/22/2022      Chemistry      Component Value Date/Time   NA 139 05/22/2022 0907   K 4.2 05/22/2022 0907   CL 108 05/22/2022 0907   CO2 26 05/22/2022 0907   BUN 13 05/22/2022 0907   CREATININE 0.66 05/22/2022 0907      Component Value Date/Time   CALCIUM 9.2 05/22/2022 0907   ALKPHOS 74 05/22/2022 0907   AST 19 05/22/2022 0907   ALT 11 05/22/2022 0907   BILITOT 0.2 (L) 05/22/2022 0907       RADIOGRAPHIC STUDIES:  CT Chest W Contrast  Result Date: 04/28/2022 CLINICAL DATA:  Stage IV non-small cell right lung cancer diagnosed April 2023 status post palliative right hilar radiotherapy with ongoing chemoimmunotherapy. Restaging. * Tracking Code: BO * EXAM: CT CHEST, ABDOMEN, AND PELVIS WITH CONTRAST TECHNIQUE: Multidetector CT imaging of the chest, abdomen and pelvis was performed following the standard protocol during bolus administration of intravenous contrast. RADIATION DOSE REDUCTION: This exam was performed according to the departmental dose-optimization program which includes automated exposure control, adjustment of the mA and/or kV according to patient size and/or use of iterative reconstruction technique. CONTRAST:  OMNIPAQUE IOHEXOL 300 MG/ML  SOLN COMPARISON:  01/15/2022 CT chest, abdomen and pelvis. FINDINGS: CT CHEST FINDINGS Cardiovascular: Normal heart size. No significant pericardial effusion/thickening. Right internal jugular Port-A-Cath terminates at the cavoatrial junction. Atherosclerotic nonaneurysmal thoracic aorta. Normal caliber pulmonary arteries. No central pulmonary emboli. Mediastinum/Nodes: No significant thyroid nodules. Unremarkable esophagus. No axillary adenopathy. No pathologically enlarged mediastinal nodes. No left hilar adenopathy. Soft tissue thickening in right hilum measures 0.9 cm (series 2/image 26), previously 0.9 cm, unchanged. Lungs/Pleura: No pneumothorax. New  trace dependent right pleural effusion. No left pleural effusion. Patchy indistinct peribronchovascular consolidation and ground-glass opacity throughout the right perihilar region including the right upper lobe, right middle lobe and superior segment right lower lobe, increased. No new significant discrete pulmonary nodules. Musculoskeletal: No aggressive appearing focal osseous lesions. Mild thoracic spondylosis. CT ABDOMEN PELVIS FINDINGS Hepatobiliary: Normal liver with no liver mass. Normal gallbladder with no radiopaque cholelithiasis. No biliary ductal dilatation. Pancreas: Normal, with no mass or duct dilation. Spleen: Normal size. No mass. Adrenals/Urinary Tract: Stable adrenal glands without recurrent adrenal nodules. Simple 3.1 cm lower left renal cyst, for which no follow-up imaging is recommended. Normal bladder. Stomach/Bowel: Normal non-distended stomach. Normal caliber small bowel with no small bowel wall thickening. Appendectomy. Oral contrast transits to the left colon. Normal large bowel with no diverticulosis,  large bowel wall thickening or pericolonic fat stranding. Vascular/Lymphatic: Atherosclerotic nonaneurysmal abdominal aorta. Patent portal, splenic, hepatic and renal veins. No pathologically enlarged lymph nodes in the abdomen or pelvis. Reproductive: Status post hysterectomy, with no abnormal findings at the vaginal cuff. No adnexal mass. Other: No pneumoperitoneum, ascites or focal fluid collection. Musculoskeletal: No aggressive appearing focal osseous lesions. Mild-to-moderate lumbar spondylosis, most prominent at L5-S1. IMPRESSION: 1. Patchy indistinct peribronchovascular consolidation and ground-glass opacity throughout the right perihilar region, increased. New trace dependent right pleural effusion. These findings are nonspecific but favor evolving postradiation change. Recommend attention on chest CT follow-up in 3-6 months. 2. Stable mild soft tissue thickening in the right  hilum, compatible with treated disease. 3. No findings highly suspicious for recurrent metastatic disease in the chest. No evidence of metastatic disease in the abdomen or pelvis. 4.  Aortic Atherosclerosis (ICD10-I70.0). Electronically Signed   By: Delbert Phenix M.D.   On: 04/28/2022 17:20   CT Abdomen Pelvis W Contrast  Result Date: 04/28/2022 CLINICAL DATA:  Stage IV non-small cell right lung cancer diagnosed April 2023 status post palliative right hilar radiotherapy with ongoing chemoimmunotherapy. Restaging. * Tracking Code: BO * EXAM: CT CHEST, ABDOMEN, AND PELVIS WITH CONTRAST TECHNIQUE: Multidetector CT imaging of the chest, abdomen and pelvis was performed following the standard protocol during bolus administration of intravenous contrast. RADIATION DOSE REDUCTION: This exam was performed according to the departmental dose-optimization program which includes automated exposure control, adjustment of the mA and/or kV according to patient size and/or use of iterative reconstruction technique. CONTRAST:  OMNIPAQUE IOHEXOL 300 MG/ML  SOLN COMPARISON:  01/15/2022 CT chest, abdomen and pelvis. FINDINGS: CT CHEST FINDINGS Cardiovascular: Normal heart size. No significant pericardial effusion/thickening. Right internal jugular Port-A-Cath terminates at the cavoatrial junction. Atherosclerotic nonaneurysmal thoracic aorta. Normal caliber pulmonary arteries. No central pulmonary emboli. Mediastinum/Nodes: No significant thyroid nodules. Unremarkable esophagus. No axillary adenopathy. No pathologically enlarged mediastinal nodes. No left hilar adenopathy. Soft tissue thickening in right hilum measures 0.9 cm (series 2/image 26), previously 0.9 cm, unchanged. Lungs/Pleura: No pneumothorax. New trace dependent right pleural effusion. No left pleural effusion. Patchy indistinct peribronchovascular consolidation and ground-glass opacity throughout the right perihilar region including the right upper lobe, right  middle lobe and superior segment right lower lobe, increased. No new significant discrete pulmonary nodules. Musculoskeletal: No aggressive appearing focal osseous lesions. Mild thoracic spondylosis. CT ABDOMEN PELVIS FINDINGS Hepatobiliary: Normal liver with no liver mass. Normal gallbladder with no radiopaque cholelithiasis. No biliary ductal dilatation. Pancreas: Normal, with no mass or duct dilation. Spleen: Normal size. No mass. Adrenals/Urinary Tract: Stable adrenal glands without recurrent adrenal nodules. Simple 3.1 cm lower left renal cyst, for which no follow-up imaging is recommended. Normal bladder. Stomach/Bowel: Normal non-distended stomach. Normal caliber small bowel with no small bowel wall thickening. Appendectomy. Oral contrast transits to the left colon. Normal large bowel with no diverticulosis, large bowel wall thickening or pericolonic fat stranding. Vascular/Lymphatic: Atherosclerotic nonaneurysmal abdominal aorta. Patent portal, splenic, hepatic and renal veins. No pathologically enlarged lymph nodes in the abdomen or pelvis. Reproductive: Status post hysterectomy, with no abnormal findings at the vaginal cuff. No adnexal mass. Other: No pneumoperitoneum, ascites or focal fluid collection. Musculoskeletal: No aggressive appearing focal osseous lesions. Mild-to-moderate lumbar spondylosis, most prominent at L5-S1. IMPRESSION: 1. Patchy indistinct peribronchovascular consolidation and ground-glass opacity throughout the right perihilar region, increased. New trace dependent right pleural effusion. These findings are nonspecific but favor evolving postradiation change. Recommend attention on chest CT follow-up in 3-6 months.  2. Stable mild soft tissue thickening in the right hilum, compatible with treated disease. 3. No findings highly suspicious for recurrent metastatic disease in the chest. No evidence of metastatic disease in the abdomen or pelvis. 4.  Aortic Atherosclerosis (ICD10-I70.0).  Electronically Signed   By: Delbert Phenix M.D.   On: 04/28/2022 17:20     ASSESSMENT/PLAN:  This is a very pleasant 57 years old white female with Stage IV (T2b, N2, M1 C) non-small cell lung cancer, adenocarcinoma presented with right hilar mass in addition to mediastinal and upper abdominal lymphadenopathy in addition to bilateral adrenal metastases and metastatic disease in the musculature of the lower back.  This was diagnosed in April 2023.   The molecular studies by [employer] showed no actionable mutations and the patient has negative PD-L1 expression. She completed palliative radiotherapy to the right hilar region under the care of Dr. Mitzi Hansen.   She completed palliative radiation to the right hilar area under the care of Dr. Mitzi Hansen.   She is currently undergoing palliative systemic chemotherapy with carboplatin for an AUC of 5, Alimta 500 mg per metered squared, Keytruda 200 mg IV every 3 weeks.  She is status post 12 cycles.  Her first dose was on 08/29/2021. Starting from cycle #5, she started maintenance alimta and [place].    Labs were reviewed. Recommend she proceed with cycle #13 today as scheduled.   We will see the patient back for follow-up visit in 3 weeks for evaluation and repeat blood work before starting cycle #14.  Discussed with the patient today that we will likely arrange for repeat CT scan of the chest, abdomen, pelvis at her next appointment.  The patient was advised to call immediately if she has any concerning symptoms in the interval. The patient voices understanding of current disease status and treatment options and is in agreement with the current care plan. All questions were answered. The patient knows to call the clinic with any problems, questions or concerns. We can certainly see the patient much sooner if necessary             No orders of the defined types were placed in this encounter.    The total time spent in the appointment was 20-29  minutes  Caswell Alvillar L Lolitha [place], PA-C 05/22/22

## 2022-05-20 ENCOUNTER — Telehealth: Payer: Self-pay | Admitting: Internal Medicine

## 2022-05-20 NOTE — Telephone Encounter (Signed)
Called patient regarding upcoming January and February appointments, patient is notified.

## 2022-05-22 ENCOUNTER — Inpatient Hospital Stay: Payer: BC Managed Care – PPO

## 2022-05-22 ENCOUNTER — Other Ambulatory Visit: Payer: Self-pay | Admitting: Internal Medicine

## 2022-05-22 ENCOUNTER — Inpatient Hospital Stay: Payer: BC Managed Care – PPO | Attending: Radiation Oncology

## 2022-05-22 ENCOUNTER — Inpatient Hospital Stay (HOSPITAL_BASED_OUTPATIENT_CLINIC_OR_DEPARTMENT_OTHER): Payer: BC Managed Care – PPO | Admitting: Physician Assistant

## 2022-05-22 ENCOUNTER — Other Ambulatory Visit: Payer: Self-pay

## 2022-05-22 VITALS — BP 105/58 | HR 98 | Temp 97.7°F | Resp 14 | Ht 60.0 in | Wt 111.9 lb

## 2022-05-22 DIAGNOSIS — C3401 Malignant neoplasm of right main bronchus: Secondary | ICD-10-CM | POA: Diagnosis not present

## 2022-05-22 DIAGNOSIS — Z5112 Encounter for antineoplastic immunotherapy: Secondary | ICD-10-CM

## 2022-05-22 DIAGNOSIS — Z5111 Encounter for antineoplastic chemotherapy: Secondary | ICD-10-CM

## 2022-05-22 DIAGNOSIS — C3491 Malignant neoplasm of unspecified part of right bronchus or lung: Secondary | ICD-10-CM

## 2022-05-22 DIAGNOSIS — C7971 Secondary malignant neoplasm of right adrenal gland: Secondary | ICD-10-CM | POA: Insufficient documentation

## 2022-05-22 DIAGNOSIS — Z95828 Presence of other vascular implants and grafts: Secondary | ICD-10-CM

## 2022-05-22 DIAGNOSIS — Z79899 Other long term (current) drug therapy: Secondary | ICD-10-CM | POA: Diagnosis not present

## 2022-05-22 DIAGNOSIS — C7989 Secondary malignant neoplasm of other specified sites: Secondary | ICD-10-CM | POA: Diagnosis not present

## 2022-05-22 DIAGNOSIS — C7972 Secondary malignant neoplasm of left adrenal gland: Secondary | ICD-10-CM | POA: Insufficient documentation

## 2022-05-22 DIAGNOSIS — Z923 Personal history of irradiation: Secondary | ICD-10-CM | POA: Diagnosis not present

## 2022-05-22 LAB — CBC WITH DIFFERENTIAL (CANCER CENTER ONLY)
Abs Immature Granulocytes: 0.04 10*3/uL (ref 0.00–0.07)
Basophils Absolute: 0.1 10*3/uL (ref 0.0–0.1)
Basophils Relative: 1 %
Eosinophils Absolute: 0.2 10*3/uL (ref 0.0–0.5)
Eosinophils Relative: 3 %
HCT: 32.6 % — ABNORMAL LOW (ref 36.0–46.0)
Hemoglobin: 10.9 g/dL — ABNORMAL LOW (ref 12.0–15.0)
Immature Granulocytes: 1 %
Lymphocytes Relative: 24 %
Lymphs Abs: 1.7 10*3/uL (ref 0.7–4.0)
MCH: 34.4 pg — ABNORMAL HIGH (ref 26.0–34.0)
MCHC: 33.4 g/dL (ref 30.0–36.0)
MCV: 102.8 fL — ABNORMAL HIGH (ref 80.0–100.0)
Monocytes Absolute: 1.1 10*3/uL — ABNORMAL HIGH (ref 0.1–1.0)
Monocytes Relative: 16 %
Neutro Abs: 3.9 10*3/uL (ref 1.7–7.7)
Neutrophils Relative %: 55 %
Platelet Count: 248 10*3/uL (ref 150–400)
RBC: 3.17 MIL/uL — ABNORMAL LOW (ref 3.87–5.11)
RDW: 15.2 % (ref 11.5–15.5)
WBC Count: 7.1 10*3/uL (ref 4.0–10.5)
nRBC: 0 % (ref 0.0–0.2)

## 2022-05-22 LAB — CMP (CANCER CENTER ONLY)
ALT: 11 U/L (ref 0–44)
AST: 19 U/L (ref 15–41)
Albumin: 3.5 g/dL (ref 3.5–5.0)
Alkaline Phosphatase: 74 U/L (ref 38–126)
Anion gap: 5 (ref 5–15)
BUN: 13 mg/dL (ref 6–20)
CO2: 26 mmol/L (ref 22–32)
Calcium: 9.2 mg/dL (ref 8.9–10.3)
Chloride: 108 mmol/L (ref 98–111)
Creatinine: 0.66 mg/dL (ref 0.44–1.00)
GFR, Estimated: >60 mL/min (ref >60)
Glucose, Bld: 91 mg/dL (ref 70–99)
Potassium: 4.2 mmol/L (ref 3.5–5.1)
Sodium: 139 mmol/L (ref 135–145)
Total Bilirubin: 0.2 mg/dL — ABNORMAL LOW (ref 0.3–1.2)
Total Protein: 6.4 g/dL — ABNORMAL LOW (ref 6.5–8.1)

## 2022-05-22 LAB — TSH: TSH: 1.298 u[IU]/mL (ref 0.350–4.500)

## 2022-05-22 MED ORDER — SODIUM CHLORIDE 0.9 % IV SOLN
500.0000 mg/m2 | Freq: Once | INTRAVENOUS | Status: AC
  Administered 2022-05-22: 700 mg via INTRAVENOUS
  Filled 2022-05-22: qty 20

## 2022-05-22 MED ORDER — PROCHLORPERAZINE MALEATE 10 MG PO TABS
10.0000 mg | ORAL_TABLET | Freq: Once | ORAL | Status: AC
  Administered 2022-05-22: 10 mg via ORAL
  Filled 2022-05-22: qty 1

## 2022-05-22 MED ORDER — SODIUM CHLORIDE 0.9 % IV SOLN
200.0000 mg | Freq: Once | INTRAVENOUS | Status: AC
  Administered 2022-05-22: 200 mg via INTRAVENOUS
  Filled 2022-05-22: qty 200

## 2022-05-22 MED ORDER — SODIUM CHLORIDE 0.9 % IV SOLN: Freq: Once | INTRAVENOUS | Status: AC

## 2022-05-22 MED ORDER — SODIUM CHLORIDE 0.9% FLUSH
10.0000 mL | INTRAVENOUS | Status: AC | PRN
  Administered 2022-05-22: 10 mL

## 2022-05-22 NOTE — Patient Instructions (Signed)
[facility]  Discharge Instructions: Thank you for choosing [facility] to provide your oncology and hematology care.   If you have a lab appointment with the [facility], please go directly to the [facility] and check in at the registration area.   Wear comfortable clothing and clothing appropriate for easy access to any Portacath or PICC line.   We strive to give you quality time with your provider. You may need to reschedule your appointment if you arrive late (15 or more minutes).  Arriving late affects you and other patients whose appointments are after yours.  Also, if you miss three or more appointments without notifying the office, you may be dismissed from the clinic at the provider's discretion.      For prescription refill requests, have your pharmacy contact our office and allow 72 hours for refills to be completed.    Today you received the following chemotherapy and/or immunotherapy agents Beryle Flock      To help prevent nausea and vomiting after your treatment, we encourage you to take your nausea medication as directed.  BELOW ARE SYMPTOMS THAT SHOULD BE REPORTED IMMEDIATELY: *FEVER GREATER THAN 100.4 F (38 C) OR HIGHER *CHILLS OR SWEATING *NAUSEA AND VOMITING THAT IS NOT CONTROLLED WITH YOUR NAUSEA MEDICATION *UNUSUAL SHORTNESS OF BREATH *UNUSUAL BRUISING OR BLEEDING *URINARY PROBLEMS (pain or burning when urinating, or frequent urination) *BOWEL PROBLEMS (unusual diarrhea, constipation, pain near the anus) TENDERNESS IN MOUTH AND THROAT WITH OR WITHOUT PRESENCE OF ULCERS (sore throat, sores in mouth, or a toothache) UNUSUAL RASH, SWELLING OR PAIN  UNUSUAL VAGINAL DISCHARGE OR ITCHING   Items with * indicate a potential emergency and should be followed up as soon as possible or go to the Emergency Department if any problems should occur.  Please show the CHEMOTHERAPY ALERT CARD or IMMUNOTHERAPY ALERT CARD at check-in to  the Emergency Department and triage nurse.  Should you have questions after your visit or need to cancel or reschedule your appointment, please contact [facility]  Dept: [phone]  and follow the prompts.  Office hours are 8:00 a.m. to 4:30 p.m. Monday - Friday. Please note that voicemails left after 4:00 p.m. may not be returned until the following business day.  We are closed weekends and major holidays. You have access to a nurse at all times for urgent questions. Please call the main number to the clinic Dept: [phone] and follow the prompts.   For any non-urgent questions, you may also contact your provider using MyChart. We now offer e-Visits for anyone 61 and older to request care online for non-urgent symptoms. For details visit [email or web address].   Also download the MyChart app! Go to the app store, search "MyChart", open the app, select [facility], and log in with your MyChart username and password.

## 2022-06-12 ENCOUNTER — Inpatient Hospital Stay: Payer: BC Managed Care – PPO

## 2022-06-12 ENCOUNTER — Other Ambulatory Visit: Payer: Self-pay

## 2022-06-12 ENCOUNTER — Inpatient Hospital Stay (HOSPITAL_BASED_OUTPATIENT_CLINIC_OR_DEPARTMENT_OTHER): Payer: BC Managed Care – PPO | Admitting: Internal Medicine

## 2022-06-12 VITALS — BP 101/72 | HR 98

## 2022-06-12 VITALS — BP 104/72 | HR 94 | Temp 98.1°F | Resp 14 | Wt 112.9 lb

## 2022-06-12 DIAGNOSIS — Z5111 Encounter for antineoplastic chemotherapy: Secondary | ICD-10-CM | POA: Diagnosis not present

## 2022-06-12 DIAGNOSIS — C3491 Malignant neoplasm of unspecified part of right bronchus or lung: Secondary | ICD-10-CM

## 2022-06-12 DIAGNOSIS — Z923 Personal history of irradiation: Secondary | ICD-10-CM | POA: Diagnosis not present

## 2022-06-12 DIAGNOSIS — Z79899 Other long term (current) drug therapy: Secondary | ICD-10-CM | POA: Diagnosis not present

## 2022-06-12 DIAGNOSIS — Z5112 Encounter for antineoplastic immunotherapy: Secondary | ICD-10-CM | POA: Diagnosis not present

## 2022-06-12 DIAGNOSIS — C7989 Secondary malignant neoplasm of other specified sites: Secondary | ICD-10-CM | POA: Diagnosis not present

## 2022-06-12 DIAGNOSIS — Z95828 Presence of other vascular implants and grafts: Secondary | ICD-10-CM | POA: Insufficient documentation

## 2022-06-12 DIAGNOSIS — C7972 Secondary malignant neoplasm of left adrenal gland: Secondary | ICD-10-CM | POA: Diagnosis not present

## 2022-06-12 DIAGNOSIS — C3401 Malignant neoplasm of right main bronchus: Secondary | ICD-10-CM | POA: Diagnosis not present

## 2022-06-12 DIAGNOSIS — C7971 Secondary malignant neoplasm of right adrenal gland: Secondary | ICD-10-CM | POA: Diagnosis not present

## 2022-06-12 LAB — CMP (CANCER CENTER ONLY)
ALT: 13 U/L (ref 0–44)
AST: 21 U/L (ref 15–41)
Albumin: 3.5 g/dL (ref 3.5–5.0)
Alkaline Phosphatase: 71 U/L (ref 38–126)
Anion gap: 4 — ABNORMAL LOW (ref 5–15)
BUN: 18 mg/dL (ref 6–20)
CO2: 27 mmol/L (ref 22–32)
Calcium: 9.6 mg/dL (ref 8.9–10.3)
Chloride: 108 mmol/L (ref 98–111)
Creatinine: 0.67 mg/dL (ref 0.44–1.00)
GFR, Estimated: >60 mL/min (ref >60)
Glucose, Bld: 86 mg/dL (ref 70–99)
Potassium: 4.1 mmol/L (ref 3.5–5.1)
Sodium: 139 mmol/L (ref 135–145)
Total Bilirubin: 0.2 mg/dL — ABNORMAL LOW (ref 0.3–1.2)
Total Protein: 6.6 g/dL (ref 6.5–8.1)

## 2022-06-12 LAB — CBC WITH DIFFERENTIAL (CANCER CENTER ONLY)
Abs Immature Granulocytes: 0.06 10*3/uL (ref 0.00–0.07)
Basophils Absolute: 0.1 10*3/uL (ref 0.0–0.1)
Basophils Relative: 1 %
Eosinophils Absolute: 0.2 10*3/uL (ref 0.0–0.5)
Eosinophils Relative: 3 %
HCT: 33.7 % — ABNORMAL LOW (ref 36.0–46.0)
Hemoglobin: 11.4 g/dL — ABNORMAL LOW (ref 12.0–15.0)
Immature Granulocytes: 1 %
Lymphocytes Relative: 25 %
Lymphs Abs: 1.9 10*3/uL (ref 0.7–4.0)
MCH: 34.4 pg — ABNORMAL HIGH (ref 26.0–34.0)
MCHC: 33.8 g/dL (ref 30.0–36.0)
MCV: 101.8 fL — ABNORMAL HIGH (ref 80.0–100.0)
Monocytes Absolute: 1.3 10*3/uL — ABNORMAL HIGH (ref 0.1–1.0)
Monocytes Relative: 16 %
Neutro Abs: 4.3 10*3/uL (ref 1.7–7.7)
Neutrophils Relative %: 54 %
Platelet Count: 267 10*3/uL (ref 150–400)
RBC: 3.31 MIL/uL — ABNORMAL LOW (ref 3.87–5.11)
RDW: 14.8 % (ref 11.5–15.5)
WBC Count: 7.9 10*3/uL (ref 4.0–10.5)
nRBC: 0 % (ref 0.0–0.2)

## 2022-06-12 LAB — TSH: TSH: 0.898 u[IU]/mL (ref 0.350–4.500)

## 2022-06-12 MED ORDER — SODIUM CHLORIDE 0.9% FLUSH
10.0000 mL | INTRAVENOUS | Status: DC | PRN
  Administered 2022-06-12: 10 mL

## 2022-06-12 MED ORDER — SODIUM CHLORIDE 0.9 % IV SOLN
500.0000 mg/m2 | Freq: Once | INTRAVENOUS | Status: AC
  Administered 2022-06-12: 700 mg via INTRAVENOUS
  Filled 2022-06-12: qty 20

## 2022-06-12 MED ORDER — SODIUM CHLORIDE 0.9 % IV SOLN
200.0000 mg | Freq: Once | INTRAVENOUS | Status: AC
  Administered 2022-06-12: 200 mg via INTRAVENOUS
  Filled 2022-06-12: qty 8

## 2022-06-12 MED ORDER — SODIUM CHLORIDE 0.9% FLUSH
10.0000 mL | Freq: Once | INTRAVENOUS | Status: AC
  Administered 2022-06-12: 10 mL

## 2022-06-12 MED ORDER — HEPARIN SOD (PORK) LOCK FLUSH 100 UNIT/ML IV SOLN
500.0000 [IU] | Freq: Once | INTRAVENOUS | Status: AC | PRN
  Administered 2022-06-12: 500 [IU]

## 2022-06-12 MED ORDER — SODIUM CHLORIDE 0.9 % IV SOLN: Freq: Once | INTRAVENOUS | Status: AC

## 2022-06-12 MED ORDER — PROCHLORPERAZINE MALEATE 10 MG PO TABS
10.0000 mg | ORAL_TABLET | Freq: Once | ORAL | Status: AC
  Administered 2022-06-12: 10 mg via ORAL
  Filled 2022-06-12: qty 1

## 2022-06-12 NOTE — Patient Instructions (Signed)
[facility]  Discharge Instructions: Thank you for choosing [facility] to provide your oncology and hematology care.   If you have a lab appointment with the [facility], please go directly to the [facility] and check in at the registration area.   Wear comfortable clothing and clothing appropriate for easy access to any Portacath or PICC line.   We strive to give you quality time with your provider. You may need to reschedule your appointment if you arrive late (15 or more minutes).  Arriving late affects you and other patients whose appointments are after yours.  Also, if you miss three or more appointments without notifying the office, you may be dismissed from the clinic at the provider's discretion.      For prescription refill requests, have your pharmacy contact our office and allow 72 hours for refills to be completed.    Today you received the following chemotherapy and/or immunotherapy agents :  Pembrolizumab & Pemetrexed.       To help prevent nausea and vomiting after your treatment, we encourage you to take your nausea medication as directed.  BELOW ARE SYMPTOMS THAT SHOULD BE REPORTED IMMEDIATELY: *FEVER GREATER THAN 100.4 F (38 C) OR HIGHER *CHILLS OR SWEATING *NAUSEA AND VOMITING THAT IS NOT CONTROLLED WITH YOUR NAUSEA MEDICATION *UNUSUAL SHORTNESS OF BREATH *UNUSUAL BRUISING OR BLEEDING *URINARY PROBLEMS (pain or burning when urinating, or frequent urination) *BOWEL PROBLEMS (unusual diarrhea, constipation, pain near the anus) TENDERNESS IN MOUTH AND THROAT WITH OR WITHOUT PRESENCE OF ULCERS (sore throat, sores in mouth, or a toothache) UNUSUAL RASH, SWELLING OR PAIN  UNUSUAL VAGINAL DISCHARGE OR ITCHING   Items with * indicate a potential emergency and should be followed up as soon as possible or go to the Emergency Department if any problems should occur.  Please show the CHEMOTHERAPY ALERT CARD or  IMMUNOTHERAPY ALERT CARD at check-in to the Emergency Department and triage nurse.  Should you have questions after your visit or need to cancel or reschedule your appointment, please contact [facility]  Dept: [phone]  and follow the prompts.  Office hours are 8:00 a.m. to 4:30 p.m. Monday - Friday. Please note that voicemails left after 4:00 p.m. may not be returned until the following business day.  We are closed weekends and major holidays. You have access to a nurse at all times for urgent questions. Please call the main number to the clinic Dept: [phone] and follow the prompts.   For any non-urgent questions, you may also contact your provider using MyChart. We now offer e-Visits for anyone 68 and older to request care online for non-urgent symptoms. For details visit [email or web address].   Also download the MyChart app! Go to the app store, search "MyChart", open the app, select [facility], and log in with your MyChart username and password.

## 2022-06-12 NOTE — Progress Notes (Signed)
[place] Telephone:([phone]   Fax:(336) [phone]  OFFICE PROGRESS NOTE  [name], FNP [address] [place] [address]  DIAGNOSIS: Stage IV (T2b, N2, M1 C) non-small cell lung cancer, adenocarcinoma presented with right hilar mass in addition to mediastinal and upper abdominal lymphadenopathy in addition to bilateral adrenal metastases and metastatic disease in the musculature of the lower back.  This was diagnosed in April 2023.  DETECTED ALTERATION(S) / BIOMARKER(S) % CFDNA OR AMPLIFICATION ASSOCIATED FDA-APPROVED THERAPIES CLINICAL TRIAL AVAILABILITY [id number] 0.5% None  Yes [id number] (Splice Site Indel) 9.4% None  Yes  PD-L1 expression 0%  PRIOR THERAPY: Palliative radiotherapy to the right hilar region under the care of Dr. Lisbeth Renshaw.  CURRENT THERAPY:  systemic chemotherapy with carboplatin for AUC of 5, Alimta 500 Mg/M2 and Keytruda 200 Mg IV every 3 weeks.  First dose August 29, 2021.  Status post 13 cycles.  Starting from cycle #5 the patient is on maintenance treatment with Alimta and Keytruda every 3 weeks.  INTERVAL HISTORY: April Walsh 57 y.o. female returns to the neck today for follow-up visit accompanied by her sister.  The patient is feeling fine today with no concerning complaints.  She denied having any current chest pain, shortness of breath, cough or hemoptysis.  She has no nausea, vomiting, diarrhea or constipation.  She has no headache or visual changes.  She has no recent weight loss or night sweats.  She is here today for evaluation before starting cycle #14 of her treatment.   MEDICAL HISTORY: Past Medical History:  Diagnosis Date   Anemia    Asthma    Bronchitis    Cancer ([facility])    basal cell melanoma, cervical cancer   COPD (chronic obstructive pulmonary disease) ([facility])    "early stages of COPD"   Headache    Lung cancer ([facility]) 08/13/2021    ALLERGIES:  has No Known Allergies.  MEDICATIONS:   Current Outpatient Medications  Medication Sig Dispense Refill   atorvastatin (LIPITOR) 80 MG tablet Take 1 tablet (80 mg total) by mouth daily. 30 tablet 3   apixaban (ELIQUIS) 2.5 MG TABS tablet Take 1 tablet (2.5 mg total) by mouth 2 (two) times daily. 60 tablet 0   Budeson-Glycopyrrol-Formoterol (BREZTRI AEROSPHERE) 160-9-4.8 MCG/ACT AERO Inhale 2 puffs into the lungs in the morning and at bedtime. 49.6 g 11   folic acid (FOLVITE) 1 MG tablet TAKE 1 TABLET BY MOUTH EVERY DAY 30 tablet 2   lidocaine-prilocaine (EMLA) cream Apply 1 Application topically as needed. 30 g 2   prochlorperazine (COMPAZINE) 10 MG tablet Take 1 tablet (10 mg total) by mouth every 6 (six) hours as needed. 30 tablet 2   No current facility-administered medications for this visit.   Facility-Administered Medications Ordered in Other Visits  Medication Dose Route Frequency Provider Last Rate Last Admin   cyanocobalamin (VITAMIN B12) 1000 MCG/ML injection            prochlorperazine (COMPAZINE) 10 MG tablet             SURGICAL HISTORY:  Past Surgical History:  Procedure Laterality Date   APPENDECTOMY     1996   BREAST BIOPSY Right    2010-2015   BRONCHIAL BIOPSY  08/13/2021   Procedure: BRONCHIAL BIOPSIES;  Surgeon: Collene Gobble, MD;  Location: [facility] ENDOSCOPY;  Service: Pulmonary;;   BRONCHIAL BRUSHINGS  08/13/2021   Procedure: BRONCHIAL BRUSHINGS;  Surgeon: Collene Gobble, MD;  Location:  [facility] ENDOSCOPY;  Service: Pulmonary;;   BRONCHIAL NEEDLE ASPIRATION BIOPSY  08/13/2021   Procedure: BRONCHIAL NEEDLE ASPIRATION BIOPSIES;  Surgeon: Collene Gobble, MD;  Location: [facility] ENDOSCOPY;  Service: Pulmonary;;   CESAREAN SECTION     1984, 1987   FOOT SURGERY Right    2 pins and screw 1999   FOOT SURGERY Left 06/21/2021   screw and 2 pins   HEMOSTASIS CONTROL  08/13/2021   Procedure: HEMOSTASIS CONTROL;  Surgeon: Collene Gobble, MD;  Location: [facility] ENDOSCOPY;  Service: Pulmonary;;   IR IMAGING GUIDED PORT INSERTION   11/26/2021   MELANOMA EXCISION Left    basal cell melanoma 2011   [place]   VIDEO BRONCHOSCOPY  08/13/2021   Procedure: VIDEO BRONCHOSCOPY WITHOUT FLUORO;  Surgeon: Collene Gobble, MD;  Location: [facility] ENDOSCOPY;  Service: Pulmonary;;   VIDEO BRONCHOSCOPY WITH ENDOBRONCHIAL ULTRASOUND N/A 08/13/2021   Procedure: VIDEO BRONCHOSCOPY WITH ENDOBRONCHIAL ULTRASOUND;  Surgeon: Collene Gobble, MD;  Location: [facility] ENDOSCOPY;  Service: Pulmonary;  Laterality: N/A;    REVIEW OF SYSTEMS:  A comprehensive review of systems was negative.   PHYSICAL EXAMINATION: General appearance: alert, cooperative, and no distress Head: Normocephalic, without obvious abnormality, atraumatic Neck: no adenopathy, no JVD, supple, symmetrical, trachea midline, and thyroid not enlarged, symmetric, no tenderness/mass/nodules Lymph nodes: Cervical, supraclavicular, and axillary nodes normal. Resp: clear to auscultation bilaterally Back: symmetric, no curvature. ROM normal. No CVA tenderness. Cardio: regular rate and rhythm, S1, S2 normal, no murmur, click, rub or gallop GI: soft, non-tender; bowel sounds normal; no masses,  no organomegaly Extremities: extremities normal, atraumatic, no cyanosis or edema  ECOG PERFORMANCE STATUS: 1 - Symptomatic but completely ambulatory  Blood pressure 104/72, pulse 94, temperature 98.1 F (36.7 C), resp. rate 14, weight 112 lb 14.4 oz (51.2 kg), SpO2 99 %.  LABORATORY DATA: Lab Results  Component Value Date   WBC 7.9 06/12/2022   HGB 11.4 (L) 06/12/2022   HCT 33.7 (L) 06/12/2022   MCV 101.8 (H) 06/12/2022   PLT 267 06/12/2022      Chemistry      Component Value Date/Time   NA 139 06/12/2022 1115   K 4.1 06/12/2022 1115   CL 108 06/12/2022 1115   CO2 27 06/12/2022 1115   BUN 18 06/12/2022 1115   CREATININE 0.67 06/12/2022 1115      Component Value Date/Time   CALCIUM 9.6 06/12/2022 1115   ALKPHOS 71 06/12/2022 1115   AST 21  06/12/2022 1115   ALT 13 06/12/2022 1115   BILITOT 0.2 (L) 06/12/2022 1115       RADIOGRAPHIC STUDIES: No results found.  ASSESSMENT AND PLAN: This is a very pleasant 57 years old white female with Stage IV (T2b, N2, M1 C) non-small cell lung cancer, adenocarcinoma presented with right hilar mass in addition to mediastinal and upper abdominal lymphadenopathy in addition to bilateral adrenal metastases and metastatic disease in the musculature of the lower back.  This was diagnosed in April 2023. The molecular studies by [employer] showed no actionable mutations and the patient has negative PD-L1 expression. She is currently undergoing palliative radiotherapy to the right hilar region under the care of Dr. Lisbeth Renshaw. The patient is currently on systemic chemotherapy started with carboplatin for AUC of 5, Alimta 500 Mg/M2 and Keytruda 200 Mg IV every 3 weeks and starting from cycle #5 she is on maintenance treatment with Alimta and Keytruda every  3 weeks.  Status post a total of 13 cycles. The patient has been tolerating this treatment well with no concerning adverse effects. I recommended for her to proceed with cycle #14 today as planned. I will see her back for follow-up visit in 3 weeks for evaluation before starting cycle #15. The patient was advised to call immediately if she has any other concerning symptoms in the interval. The patient voices understanding of current disease status and treatment options and is in agreement with the current care plan.  All questions were answered. The patient knows to call the clinic with any problems, questions or concerns. We can certainly see the patient much sooner if necessary.  The total time spent in the appointment was 20 minutes.  Disclaimer: This note was dictated with voice recognition software. Similar sounding words can inadvertently be transcribed and may not be corrected upon review.

## 2022-06-28 NOTE — Progress Notes (Signed)
[facility] OFFICE PROGRESS NOTE  [name], FNP [address] [place] [address]  DIAGNOSIS: Stage IV (T2b, N2, M1 C) non-small cell lung cancer, adenocarcinoma presented with right hilar mass in addition to mediastinal and upper abdominal lymphadenopathy in addition to bilateral adrenal metastases and metastatic disease in the musculature of the lower back.  This was diagnosed in April 2023.   DETECTED ALTERATION(S) / BIOMARKER(S)     % CFDNA OR AMPLIFICATION        ASSOCIATED FDA-APPROVED THERAPIES         CLINICAL TRIAL AVAILABILITY [id number] 0.5% None     Yes [id number] (Splice Site Indel) 0.6% None     Yes   PD-L1 expression 0%  PRIOR THERAPY: Palliative radiotherapy to the right hilar region under the care of Dr. Mitzi Hansen.      CURRENT THERAPY: Systemic chemotherapy with carboplatin for AUC of 5, Alimta 500 Mg/M2 and Keytruda 200 Mg IV every 3 weeks.  First dose August 29, 2021. Status post 14 cycles.  Starting from cycle #5, the patient started maintenance Alimta and Keytruda.     INTERVAL HISTORY: Tywanda Lehnert 57 y.o. female returns to the clinic today for a follow-up visit.The patient is feeling fairly well today without any concerning complaints. The patient is currently undergoing chemotherapy and immunotherapy.  She denies any fever, chills, or night sweats.  She gained some weight and her appetite fluctuates.  She has a poor appetite a couple days after treatment; however, she did mention this is getting better and she has been recovering quicker.  Denies changes in her baseline dyspnea on exertion. Denies significant cough ecept for a mild cough related to allergies/pollen.  She denies any chest pain or hemoptysis. Denies any recent nausea, vomiting, diarrhea, or constipation.  Denies any headache or visual changes.  She denies any rashes or skin changes.  She is here today for evaluation and repeat blood work before starting cycle #15.         MEDICAL HISTORY: Past Medical History:  Diagnosis Date   Anemia    Asthma    Bronchitis    Cancer ([facility])    basal cell melanoma, cervical cancer   COPD (chronic obstructive pulmonary disease) ([facility])    "early stages of COPD"   Headache    Lung cancer ([facility]) 08/13/2021    ALLERGIES:  has No Known Allergies.  MEDICATIONS:  Current Outpatient Medications  Medication Sig Dispense Refill   apixaban (ELIQUIS) 2.5 MG TABS tablet Take 1 tablet (2.5 mg total) by mouth 2 (two) times daily. 60 tablet 0   atorvastatin (LIPITOR) 80 MG tablet Take 1 tablet (80 mg total) by mouth daily. 30 tablet 3   Budeson-Glycopyrrol-Formoterol (BREZTRI AEROSPHERE) 160-9-4.8 MCG/ACT AERO Inhale 2 puffs into the lungs in the morning and at bedtime. 10.7 g 11   folic acid (FOLVITE) 1 MG tablet TAKE 1 TABLET BY MOUTH EVERY DAY 30 tablet 2   lidocaine-prilocaine (EMLA) cream Apply 1 Application topically as needed. 30 g 2   prochlorperazine (COMPAZINE) 10 MG tablet Take 1 tablet (10 mg total) by mouth every 6 (six) hours as needed. 30 tablet 2   No current facility-administered medications for this visit.   Facility-Administered Medications Ordered in Other Visits  Medication Dose Route Frequency Provider Last Rate Last Admin   cyanocobalamin (VITAMIN B12) 1000 MCG/ML injection            prochlorperazine (COMPAZINE) 10 MG tablet  SURGICAL HISTORY:  Past Surgical History:  Procedure Laterality Date   APPENDECTOMY     1996   BREAST BIOPSY Right    2010-2015   BRONCHIAL BIOPSY  08/13/2021   Procedure: BRONCHIAL BIOPSIES;  Surgeon: Leslye Peer, MD;  [facility] ENDOSCOPY;  Service: Pulmonary;;   BRONCHIAL BRUSHINGS  08/13/2021   Procedure: BRONCHIAL BRUSHINGS;  Surgeon: Leslye Peer, MD;  Location: [facility] ENDOSCOPY;  Service: Pulmonary;;   BRONCHIAL NEEDLE ASPIRATION BIOPSY  08/13/2021   Procedure: BRONCHIAL NEEDLE ASPIRATION BIOPSIES;  Surgeon: Leslye Peer, MD;  Location: [facility]  ENDOSCOPY;  Service: Pulmonary;;   CESAREAN SECTION     1984, 1987   FOOT SURGERY Right    2 pins and screw 1999   FOOT SURGERY Left 06/21/2021   screw and 2 pins   HEMOSTASIS CONTROL  08/13/2021   Procedure: HEMOSTASIS CONTROL;  Surgeon: Leslye Peer, MD;  Location: [facility] ENDOSCOPY;  Service: Pulmonary;;   IR IMAGING GUIDED PORT INSERTION  11/26/2021   MELANOMA EXCISION Left    basal cell melanoma 2011   PARTIAL HYSTERECTOMY     1994   TUBAL LIGATION     1987   VIDEO BRONCHOSCOPY  08/13/2021   Procedure: VIDEO BRONCHOSCOPY WITHOUT FLUORO;  Surgeon: Leslye Peer, MD;  Location: [facility] ENDOSCOPY;  Service: Pulmonary;;   VIDEO BRONCHOSCOPY WITH ENDOBRONCHIAL ULTRASOUND N/A 08/13/2021   Procedure: VIDEO BRONCHOSCOPY WITH ENDOBRONCHIAL ULTRASOUND;  Surgeon: Leslye Peer, MD;  Location: [facility] ENDOSCOPY;  Service: Pulmonary;  Laterality: N/A;    REVIEW OF SYSTEMS:   Review of Systems  Constitutional: Negative for appetite change, chills, fatigue, and fever HENT: Negative for mouth sores, nosebleeds, sore throat and trouble swallowing.  Eyes: Negative for eye problems and icterus.  Respiratory: Positive for improving dyspnea with activities. Negative for hemoptysis and wheezing.   Cardiovascular: Negative for chest pain and leg swelling.  Gastrointestinal: Negative for abdominal pain, nausea, constipation, diarrhea, and vomiting.  Genitourinary: Negative for bladder incontinence, difficulty urinating, dysuria, frequency and hematuria.   Musculoskeletal: Negative for back pain, gait problem, neck pain and neck stiffness.  Skin:Negative for rash or skin changes.  Neurological: Negative for dizziness, extremity weakness, gait problem, headaches, light-headedness and seizures.  Hematological: Negative for adenopathy. Does not bruise/bleed easily.  Psychiatric/Behavioral: Negative for confusion, depression and sleep disturbance. The patient is not nervous/anxious.   PHYSICAL EXAMINATION:  Blood  pressure 110/77, pulse 98, temperature 97.9 F (36.6 C), resp. rate 17, weight 115 lb 8 oz (52.4 kg), SpO2 95 %.  ECOG PERFORMANCE STATUS: 1  Physical Exam  Constitutional: Oriented to person, place, and time and positive for thin appearing female and in no acute distress.   HENT:  Head: Normocephalic and atraumatic.  Mouth/Throat: Oropharynx is clear and moist. No oropharyngeal exudate.  Eyes: Conjunctivae are normal. Right eye exhibits no discharge. Left eye exhibits no discharge. No scleral icterus.  Neck: Normal range of motion. Neck supple.  Cardiovascular: Normal rate, regular rhythm, normal heart sounds and intact distal pulses.   Pulmonary/Chest: Effort normal and breath sounds normal. No respiratory distress. No wheezes. No rales.  Abdominal: Soft. Bowel sounds are normal. Exhibits no distension and no mass. There is no tenderness.  Musculoskeletal: Normal range of motion. Exhibits no edema.  Lymphadenopathy:    No cervical adenopathy.  Neurological: Alert and oriented to person, place, and time. Exhibits normal muscle tone. Gait normal. Coordination normal.  Skin: Not diaphoretic. No erythema. No pallor.  Psychiatric: Mood, memory and judgment normal.  Vitals reviewed.  LABORATORY DATA: Lab Results  Component Value Date   WBC 6.7 07/03/2022   HGB 11.0 (L) 07/03/2022   HCT 33.1 (L) 07/03/2022   MCV 102.5 (H) 07/03/2022   PLT 264 07/03/2022      Chemistry      Component Value Date/Time   NA 138 07/03/2022 1033   K 4.4 07/03/2022 1033   CL 108 07/03/2022 1033   CO2 25 07/03/2022 1033   BUN 13 07/03/2022 1033   CREATININE 0.62 07/03/2022 1033      Component Value Date/Time   CALCIUM 8.8 (L) 07/03/2022 1033   ALKPHOS 84 07/03/2022 1033   AST 22 07/03/2022 1033   ALT 17 07/03/2022 1033   BILITOT 0.2 (L) 07/03/2022 1033       RADIOGRAPHIC STUDIES:  No results found.   ASSESSMENT/PLAN:  This is a very pleasant 57 years old white female with Stage IV (T2b,  N2, M1 C) non-small cell lung cancer, adenocarcinoma presented with right hilar mass in addition to mediastinal and upper abdominal lymphadenopathy in addition to bilateral adrenal metastases and metastatic disease in the musculature of the lower back.  This was diagnosed in April 2023.   The molecular studies by [employer] showed no actionable mutations and the patient has negative PD-L1 expression. She completed palliative radiotherapy to the right hilar region under the care of Dr. Mitzi Hansen.  She completed palliative radiation to the right hilar area under the care of Dr. Mitzi Hansen.   She is currently undergoing palliative systemic chemotherapy with carboplatin for an AUC of 5, Alimta 500 mg per metered squared, Keytruda 200 mg IV every 3 weeks.  She is status post 14 cycles.  Her first dose was on 08/29/2021. Starting from cycle #5, she started maintenance alimta and [place].    Labs were reviewed. Recommend she proceed with cycle #15 today as scheduled.  We will arrange for restaging CT scan of her chest, abdomen, pelvis prior to her next appointment in 3 weeks.  Will see her back in 3 weeks to review her scan results and for evaluation before starting cycle #16.  The patient was advised to call immediately if she has any concerning symptoms in the interval. The patient voices understanding of current disease status and treatment options and is in agreement with the current care plan. All questions were answered. The patient knows to call the clinic with any problems, questions or concerns. We can certainly see the patient much sooner if necessary      Orders Placed This Encounter  Procedures   CT Chest W Contrast    Standing Status:   Future    Standing Expiration Date:   07/03/2023    Order Specific Question:   If indicated for the ordered procedure, I authorize the administration of contrast media per Radiology protocol    Answer:   Yes    Order Specific Question:   Does the patient  have a contrast media/X-ray dye allergy?    Answer:   No    Order Specific Question:   Is patient pregnant?    Answer:   No    Order Specific Question:   Preferred imaging location?    Answer:   [facility]   CT Abdomen Pelvis W Contrast    Standing Status:   Future    Standing Expiration Date:   07/03/2023    Order Specific Question:   If indicated for the ordered procedure, I authorize the administration of contrast media per  Radiology protocol    Answer:   Yes    Order Specific Question:   Does the patient have a contrast media/X-ray dye allergy?    Answer:   No    Order Specific Question:   Is patient pregnant?    Answer:   No    Order Specific Question:   Preferred imaging location?    Answer:   [facility]    Order Specific Question:   Is Oral Contrast requested for this exam?    Answer:   Yes, Per Radiology protocol     The total time spent in the appointment was 20-29 minutes.   Shelita Steptoe L Harley Mccartney, PA-C 07/03/22

## 2022-07-03 ENCOUNTER — Inpatient Hospital Stay: Payer: BC Managed Care – PPO

## 2022-07-03 ENCOUNTER — Inpatient Hospital Stay: Payer: BC Managed Care – PPO | Attending: Radiation Oncology | Admitting: Physician Assistant

## 2022-07-03 ENCOUNTER — Other Ambulatory Visit: Payer: Self-pay

## 2022-07-03 VITALS — BP 110/77 | HR 98 | Temp 97.9°F | Resp 17 | Wt 115.5 lb

## 2022-07-03 DIAGNOSIS — C3491 Malignant neoplasm of unspecified part of right bronchus or lung: Secondary | ICD-10-CM | POA: Diagnosis not present

## 2022-07-03 DIAGNOSIS — Z85820 Personal history of malignant melanoma of skin: Secondary | ICD-10-CM | POA: Insufficient documentation

## 2022-07-03 DIAGNOSIS — Z5112 Encounter for antineoplastic immunotherapy: Secondary | ICD-10-CM | POA: Diagnosis not present

## 2022-07-03 DIAGNOSIS — Z79899 Other long term (current) drug therapy: Secondary | ICD-10-CM | POA: Insufficient documentation

## 2022-07-03 DIAGNOSIS — Z8541 Personal history of malignant neoplasm of cervix uteri: Secondary | ICD-10-CM | POA: Insufficient documentation

## 2022-07-03 DIAGNOSIS — C7972 Secondary malignant neoplasm of left adrenal gland: Secondary | ICD-10-CM | POA: Insufficient documentation

## 2022-07-03 DIAGNOSIS — Z923 Personal history of irradiation: Secondary | ICD-10-CM | POA: Insufficient documentation

## 2022-07-03 DIAGNOSIS — C7971 Secondary malignant neoplasm of right adrenal gland: Secondary | ICD-10-CM | POA: Diagnosis not present

## 2022-07-03 DIAGNOSIS — E538 Deficiency of other specified B group vitamins: Secondary | ICD-10-CM | POA: Diagnosis not present

## 2022-07-03 DIAGNOSIS — Z95828 Presence of other vascular implants and grafts: Secondary | ICD-10-CM

## 2022-07-03 DIAGNOSIS — Z5111 Encounter for antineoplastic chemotherapy: Secondary | ICD-10-CM | POA: Diagnosis not present

## 2022-07-03 LAB — CBC WITH DIFFERENTIAL (CANCER CENTER ONLY)
Abs Immature Granulocytes: 0.03 10*3/uL (ref 0.00–0.07)
Basophils Absolute: 0.1 10*3/uL (ref 0.0–0.1)
Basophils Relative: 1 %
Eosinophils Absolute: 0.2 10*3/uL (ref 0.0–0.5)
Eosinophils Relative: 3 %
HCT: 33.1 % — ABNORMAL LOW (ref 36.0–46.0)
Hemoglobin: 11 g/dL — ABNORMAL LOW (ref 12.0–15.0)
Immature Granulocytes: 0 %
Lymphocytes Relative: 28 %
Lymphs Abs: 1.9 10*3/uL (ref 0.7–4.0)
MCH: 34.1 pg — ABNORMAL HIGH (ref 26.0–34.0)
MCHC: 33.2 g/dL (ref 30.0–36.0)
MCV: 102.5 fL — ABNORMAL HIGH (ref 80.0–100.0)
Monocytes Absolute: 1.2 10*3/uL — ABNORMAL HIGH (ref 0.1–1.0)
Monocytes Relative: 18 %
Neutro Abs: 3.4 10*3/uL (ref 1.7–7.7)
Neutrophils Relative %: 50 %
Platelet Count: 264 10*3/uL (ref 150–400)
RBC: 3.23 MIL/uL — ABNORMAL LOW (ref 3.87–5.11)
RDW: 14.7 % (ref 11.5–15.5)
WBC Count: 6.7 10*3/uL (ref 4.0–10.5)
nRBC: 0 % (ref 0.0–0.2)

## 2022-07-03 LAB — CMP (CANCER CENTER ONLY)
ALT: 17 U/L (ref 0–44)
AST: 22 U/L (ref 15–41)
Albumin: 3.4 g/dL — ABNORMAL LOW (ref 3.5–5.0)
Alkaline Phosphatase: 84 U/L (ref 38–126)
Anion gap: 5 (ref 5–15)
BUN: 13 mg/dL (ref 6–20)
CO2: 25 mmol/L (ref 22–32)
Calcium: 8.8 mg/dL — ABNORMAL LOW (ref 8.9–10.3)
Chloride: 108 mmol/L (ref 98–111)
Creatinine: 0.62 mg/dL (ref 0.44–1.00)
GFR, Estimated: >60 mL/min (ref >60)
Glucose, Bld: 100 mg/dL — ABNORMAL HIGH (ref 70–99)
Potassium: 4.4 mmol/L (ref 3.5–5.1)
Sodium: 138 mmol/L (ref 135–145)
Total Bilirubin: 0.2 mg/dL — ABNORMAL LOW (ref 0.3–1.2)
Total Protein: 6.3 g/dL — ABNORMAL LOW (ref 6.5–8.1)

## 2022-07-03 LAB — TSH: TSH: 1.175 u[IU]/mL (ref 0.350–4.500)

## 2022-07-03 MED ORDER — SODIUM CHLORIDE 0.9% FLUSH
10.0000 mL | INTRAVENOUS | Status: DC | PRN
  Administered 2022-07-03: 10 mL

## 2022-07-03 MED ORDER — SODIUM CHLORIDE 0.9 % IV SOLN
500.0000 mg/m2 | Freq: Once | INTRAVENOUS | Status: AC
  Administered 2022-07-03: 700 mg via INTRAVENOUS
  Filled 2022-07-03: qty 20

## 2022-07-03 MED ORDER — HEPARIN SOD (PORK) LOCK FLUSH 100 UNIT/ML IV SOLN
500.0000 [IU] | Freq: Once | INTRAVENOUS | Status: AC | PRN
  Administered 2022-07-03: 500 [IU]

## 2022-07-03 MED ORDER — CYANOCOBALAMIN 1000 MCG/ML IJ SOLN
1000.0000 ug | Freq: Once | INTRAMUSCULAR | Status: AC
  Administered 2022-07-03: 1000 ug via INTRAMUSCULAR
  Filled 2022-07-03: qty 1

## 2022-07-03 MED ORDER — PROCHLORPERAZINE MALEATE 10 MG PO TABS
10.0000 mg | ORAL_TABLET | Freq: Once | ORAL | Status: AC
  Administered 2022-07-03: 10 mg via ORAL
  Filled 2022-07-03: qty 1

## 2022-07-03 MED ORDER — SODIUM CHLORIDE 0.9% FLUSH
10.0000 mL | Freq: Once | INTRAVENOUS | Status: AC
  Administered 2022-07-03: 10 mL

## 2022-07-03 MED ORDER — SODIUM CHLORIDE 0.9 % IV SOLN
200.0000 mg | Freq: Once | INTRAVENOUS | Status: AC
  Administered 2022-07-03: 200 mg via INTRAVENOUS
  Filled 2022-07-03: qty 8

## 2022-07-03 MED ORDER — SODIUM CHLORIDE 0.9 % IV SOLN: Freq: Once | INTRAVENOUS | Status: AC

## 2022-07-03 NOTE — Patient Instructions (Signed)
[facility]  Discharge Instructions: Thank you for choosing [facility] to provide your oncology and hematology care.   If you have a lab appointment with the [facility], please go directly to the [facility] and check in at the registration area.   Wear comfortable clothing and clothing appropriate for easy access to any Portacath or PICC line.   We strive to give you quality time with your provider. You may need to reschedule your appointment if you arrive late (15 or more minutes).  Arriving late affects you and other patients whose appointments are after yours.  Also, if you miss three or more appointments without notifying the office, you may be dismissed from the clinic at the provider's discretion.      For prescription refill requests, have your pharmacy contact our office and allow 72 hours for refills to be completed.    Today you received the following chemotherapy and/or immunotherapy agents :  Pembrolizumab & Pemetrexed.       To help prevent nausea and vomiting after your treatment, we encourage you to take your nausea medication as directed.  BELOW ARE SYMPTOMS THAT SHOULD BE REPORTED IMMEDIATELY: *FEVER GREATER THAN 100.4 F (38 C) OR HIGHER *CHILLS OR SWEATING *NAUSEA AND VOMITING THAT IS NOT CONTROLLED WITH YOUR NAUSEA MEDICATION *UNUSUAL SHORTNESS OF BREATH *UNUSUAL BRUISING OR BLEEDING *URINARY PROBLEMS (pain or burning when urinating, or frequent urination) *BOWEL PROBLEMS (unusual diarrhea, constipation, pain near the anus) TENDERNESS IN MOUTH AND THROAT WITH OR WITHOUT PRESENCE OF ULCERS (sore throat, sores in mouth, or a toothache) UNUSUAL RASH, SWELLING OR PAIN  UNUSUAL VAGINAL DISCHARGE OR ITCHING   Items with * indicate a potential emergency and should be followed up as soon as possible or go to the Emergency Department if any problems should occur.  Please show the CHEMOTHERAPY ALERT CARD or  IMMUNOTHERAPY ALERT CARD at check-in to the Emergency Department and triage nurse.  Should you have questions after your visit or need to cancel or reschedule your appointment, please contact [facility]  Dept: [phone]  and follow the prompts.  Office hours are 8:00 a.m. to 4:30 p.m. Monday - Friday. Please note that voicemails left after 4:00 p.m. may not be returned until the following business day.  We are closed weekends and major holidays. You have access to a nurse at all times for urgent questions. Please call the main number to the clinic Dept: [phone] and follow the prompts.   For any non-urgent questions, you may also contact your provider using MyChart. We now offer e-Visits for anyone 68 and older to request care online for non-urgent symptoms. For details visit [email or web address].   Also download the MyChart app! Go to the app store, search "MyChart", open the app, select [facility], and log in with your MyChart username and password.

## 2022-07-03 NOTE — Progress Notes (Signed)
Patient seen by PA today  Vitals are within treatment parameters.  Labs reviewed: and are within treatment parameters.  Per physician team, patient is ready for treatment and there are NO modifications to the treatment plan.

## 2022-07-05 LAB — T4: T4, Total: 7.1 ug/dL (ref 4.5–12.0)

## 2022-07-17 ENCOUNTER — Other Ambulatory Visit: Payer: Self-pay | Admitting: Physician Assistant

## 2022-07-17 DIAGNOSIS — C3401 Malignant neoplasm of right main bronchus: Secondary | ICD-10-CM

## 2022-07-22 ENCOUNTER — Ambulatory Visit (HOSPITAL_COMMUNITY)
Admission: RE | Admit: 2022-07-22 | Discharge: 2022-07-22 | Disposition: A | Discharge status: Home / Self Care | Payer: BC Managed Care – PPO | Source: Ambulatory Visit | Attending: Physician Assistant | Admitting: Physician Assistant

## 2022-07-22 DIAGNOSIS — C3491 Malignant neoplasm of unspecified part of right bronchus or lung: Secondary | ICD-10-CM

## 2022-07-22 DIAGNOSIS — K7689 Other specified diseases of liver: Secondary | ICD-10-CM | POA: Diagnosis not present

## 2022-07-22 DIAGNOSIS — J90 Pleural effusion, not elsewhere classified: Secondary | ICD-10-CM | POA: Diagnosis not present

## 2022-07-22 DIAGNOSIS — C3490 Malignant neoplasm of unspecified part of unspecified bronchus or lung: Secondary | ICD-10-CM | POA: Diagnosis not present

## 2022-07-22 MED ORDER — IOHEXOL 9 MG/ML PO SOLN
500.0000 mL | ORAL | Status: AC
  Administered 2022-07-22: 500 mL via ORAL

## 2022-07-22 MED ORDER — IOHEXOL 9 MG/ML PO SOLN
ORAL | Status: AC
  Filled 2022-07-22: qty 1000

## 2022-07-22 MED ORDER — IOHEXOL 300 MG/ML  SOLN
100.0000 mL | Freq: Once | INTRAMUSCULAR | Status: AC | PRN
  Administered 2022-07-22: 100 mL via INTRAVENOUS

## 2022-07-22 MED ORDER — SODIUM CHLORIDE (PF) 0.9 % IJ SOLN
INTRAMUSCULAR | Status: AC
  Filled 2022-07-22: qty 50

## 2022-07-22 MED ORDER — HEPARIN SOD (PORK) LOCK FLUSH 100 UNIT/ML IV SOLN
INTRAVENOUS | Status: AC
  Administered 2022-07-22: 500 [IU]
  Filled 2022-07-22: qty 5

## 2022-07-24 ENCOUNTER — Other Ambulatory Visit: Payer: Self-pay

## 2022-07-24 ENCOUNTER — Inpatient Hospital Stay: Payer: BC Managed Care – PPO

## 2022-07-24 ENCOUNTER — Inpatient Hospital Stay (HOSPITAL_BASED_OUTPATIENT_CLINIC_OR_DEPARTMENT_OTHER): Payer: BC Managed Care – PPO | Admitting: Internal Medicine

## 2022-07-24 ENCOUNTER — Encounter: Payer: Self-pay | Admitting: Internal Medicine

## 2022-07-24 ENCOUNTER — Encounter: Payer: Self-pay | Admitting: Medical Oncology

## 2022-07-24 ENCOUNTER — Inpatient Hospital Stay: Payer: BC Managed Care – PPO | Attending: Radiation Oncology

## 2022-07-24 DIAGNOSIS — Z8541 Personal history of malignant neoplasm of cervix uteri: Secondary | ICD-10-CM | POA: Diagnosis not present

## 2022-07-24 DIAGNOSIS — Z95828 Presence of other vascular implants and grafts: Secondary | ICD-10-CM

## 2022-07-24 DIAGNOSIS — C7971 Secondary malignant neoplasm of right adrenal gland: Secondary | ICD-10-CM | POA: Insufficient documentation

## 2022-07-24 DIAGNOSIS — Z85820 Personal history of malignant melanoma of skin: Secondary | ICD-10-CM | POA: Insufficient documentation

## 2022-07-24 DIAGNOSIS — Z923 Personal history of irradiation: Secondary | ICD-10-CM | POA: Insufficient documentation

## 2022-07-24 DIAGNOSIS — Z5112 Encounter for antineoplastic immunotherapy: Secondary | ICD-10-CM | POA: Diagnosis not present

## 2022-07-24 DIAGNOSIS — C3491 Malignant neoplasm of unspecified part of right bronchus or lung: Secondary | ICD-10-CM

## 2022-07-24 DIAGNOSIS — E538 Deficiency of other specified B group vitamins: Secondary | ICD-10-CM | POA: Insufficient documentation

## 2022-07-24 DIAGNOSIS — C7972 Secondary malignant neoplasm of left adrenal gland: Secondary | ICD-10-CM | POA: Insufficient documentation

## 2022-07-24 DIAGNOSIS — Z5111 Encounter for antineoplastic chemotherapy: Secondary | ICD-10-CM | POA: Insufficient documentation

## 2022-07-24 DIAGNOSIS — R5383 Other fatigue: Secondary | ICD-10-CM | POA: Diagnosis not present

## 2022-07-24 DIAGNOSIS — Z79620 Long term (current) use of immunosuppressive biologic: Secondary | ICD-10-CM | POA: Insufficient documentation

## 2022-07-24 LAB — CMP (CANCER CENTER ONLY)
ALT: 11 U/L (ref 0–44)
AST: 19 U/L (ref 15–41)
Albumin: 3.3 g/dL — ABNORMAL LOW (ref 3.5–5.0)
Alkaline Phosphatase: 71 U/L (ref 38–126)
Anion gap: 4 — ABNORMAL LOW (ref 5–15)
BUN: 14 mg/dL (ref 6–20)
CO2: 25 mmol/L (ref 22–32)
Calcium: 8.8 mg/dL — ABNORMAL LOW (ref 8.9–10.3)
Chloride: 110 mmol/L (ref 98–111)
Creatinine: 0.65 mg/dL (ref 0.44–1.00)
GFR, Estimated: >60 mL/min (ref >60)
Glucose, Bld: 115 mg/dL — ABNORMAL HIGH (ref 70–99)
Potassium: 4.1 mmol/L (ref 3.5–5.1)
Sodium: 139 mmol/L (ref 135–145)
Total Bilirubin: 0.2 mg/dL — ABNORMAL LOW (ref 0.3–1.2)
Total Protein: 6.1 g/dL — ABNORMAL LOW (ref 6.5–8.1)

## 2022-07-24 LAB — CBC WITH DIFFERENTIAL (CANCER CENTER ONLY)
Abs Immature Granulocytes: 0.03 10*3/uL (ref 0.00–0.07)
Basophils Absolute: 0.1 10*3/uL (ref 0.0–0.1)
Basophils Relative: 1 %
Eosinophils Absolute: 0.2 10*3/uL (ref 0.0–0.5)
Eosinophils Relative: 3 %
HCT: 31.8 % — ABNORMAL LOW (ref 36.0–46.0)
Hemoglobin: 10.9 g/dL — ABNORMAL LOW (ref 12.0–15.0)
Immature Granulocytes: 1 %
Lymphocytes Relative: 25 %
Lymphs Abs: 1.4 10*3/uL (ref 0.7–4.0)
MCH: 34.8 pg — ABNORMAL HIGH (ref 26.0–34.0)
MCHC: 34.3 g/dL (ref 30.0–36.0)
MCV: 101.6 fL — ABNORMAL HIGH (ref 80.0–100.0)
Monocytes Absolute: 1 10*3/uL (ref 0.1–1.0)
Monocytes Relative: 17 %
Neutro Abs: 3 10*3/uL (ref 1.7–7.7)
Neutrophils Relative %: 53 %
Platelet Count: 246 10*3/uL (ref 150–400)
RBC: 3.13 MIL/uL — ABNORMAL LOW (ref 3.87–5.11)
RDW: 15 % (ref 11.5–15.5)
Smear Review: NORMAL
WBC Count: 5.7 10*3/uL (ref 4.0–10.5)
nRBC: 0 % (ref 0.0–0.2)

## 2022-07-24 LAB — TSH: TSH: 0.884 u[IU]/mL (ref 0.350–4.500)

## 2022-07-24 MED ORDER — SODIUM CHLORIDE 0.9 % IV SOLN: Freq: Once | INTRAVENOUS | Status: AC

## 2022-07-24 MED ORDER — PROCHLORPERAZINE MALEATE 10 MG PO TABS
10.0000 mg | ORAL_TABLET | Freq: Once | ORAL | Status: AC
  Administered 2022-07-24: 10 mg via ORAL
  Filled 2022-07-24: qty 1

## 2022-07-24 MED ORDER — SODIUM CHLORIDE 0.9 % IV SOLN
500.0000 mg/m2 | Freq: Once | INTRAVENOUS | Status: AC
  Administered 2022-07-24: 700 mg via INTRAVENOUS
  Filled 2022-07-24: qty 20

## 2022-07-24 MED ORDER — SODIUM CHLORIDE 0.9% FLUSH
10.0000 mL | Freq: Once | INTRAVENOUS | Status: AC
  Administered 2022-07-24: 10 mL

## 2022-07-24 MED ORDER — SODIUM CHLORIDE 0.9 % IV SOLN
200.0000 mg | Freq: Once | INTRAVENOUS | Status: AC
  Administered 2022-07-24: 200 mg via INTRAVENOUS
  Filled 2022-07-24: qty 8

## 2022-07-24 NOTE — Patient Instructions (Signed)
[facility]  Discharge Instructions: Thank you for choosing [facility] to provide your oncology and hematology care.   If you have a lab appointment with the Cancer Center, please go directly to the Cancer Center and check in at the registration area.   Wear comfortable clothing and clothing appropriate for easy access to any Portacath or PICC line.   We strive to give you quality time with your provider. You may need to reschedule your appointment if you arrive late (15 or more minutes).  Arriving late affects you and other patients whose appointments are after yours.  Also, if you miss three or more appointments without notifying the office, you may be dismissed from the clinic at the provider's discretion.      For prescription refill requests, have your pharmacy contact our office and allow 72 hours for refills to be completed.    Today you received the following chemotherapy and/or immunotherapy agents :  Pembrolizumab & Pemetrexed.       To help prevent nausea and vomiting after your treatment, we encourage you to take your nausea medication as directed.  BELOW ARE SYMPTOMS THAT SHOULD BE REPORTED IMMEDIATELY: *FEVER GREATER THAN 100.4 F (38 C) OR HIGHER *CHILLS OR SWEATING *NAUSEA AND VOMITING THAT IS NOT CONTROLLED WITH YOUR NAUSEA MEDICATION *UNUSUAL SHORTNESS OF BREATH *UNUSUAL BRUISING OR BLEEDING *URINARY PROBLEMS (pain or burning when urinating, or frequent urination) *BOWEL PROBLEMS (unusual diarrhea, constipation, pain near the anus) TENDERNESS IN MOUTH AND THROAT WITH OR WITHOUT PRESENCE OF ULCERS (sore throat, sores in mouth, or a toothache) UNUSUAL RASH, SWELLING OR PAIN  UNUSUAL VAGINAL DISCHARGE OR ITCHING   Items with * indicate a potential emergency and should be followed up as soon as possible or go to the Emergency Department if any problems should occur.  Please show the CHEMOTHERAPY ALERT CARD or  IMMUNOTHERAPY ALERT CARD at check-in to the Emergency Department and triage nurse.  Should you have questions after your visit or need to cancel or reschedule your appointment, please contact [facility]  Dept: [phone]  and follow the prompts.  Office hours are 8:00 a.m. to 4:30 p.m. Monday - Friday. Please note that voicemails left after 4:00 p.m. may not be returned until the following business day.  We are closed weekends and major holidays. You have access to a nurse at all times for urgent questions. Please call the main number to the clinic Dept: [phone] and follow the prompts.   For any non-urgent questions, you may also contact your provider using MyChart. We now offer e-Visits for anyone 18 and older to request care online for non-urgent symptoms. For details visit [email or web address].   Also download the MyChart app! Go to the app store, search "MyChart", open the app, select [facility], and log in with your MyChart username and password.   

## 2022-07-24 NOTE — Progress Notes (Unsigned)
Patient seen by MD today  Vitals are within treatment parameters.  Labs reviewed: and are within treatment parameters.  Per physician team, patient is ready for treatment and there are NO modifications to the treatment plan.  

## 2022-07-24 NOTE — Progress Notes (Signed)
[facility] Telephone:([phone]) [phone]   Fax:([phone]) [phone]  OFFICE PROGRESS NOTE  [name], FNP [address] [place] [address]  DIAGNOSIS: Stage IV (T2b, N2, M1 C) non-small cell lung cancer, adenocarcinoma presented with right hilar mass in addition to mediastinal and upper abdominal lymphadenopathy in addition to bilateral adrenal metastases and metastatic disease in the musculature of the lower back.  This was diagnosed in April 2023.  DETECTED ALTERATION(S) / BIOMARKER(S) % CFDNA OR AMPLIFICATION ASSOCIATED FDA-APPROVED THERAPIES CLINICAL TRIAL AVAILABILITY [id number] 0.5% None  Yes [id number] (Splice Site Indel) [id number] None  Yes  PD-L1 expression 0%  PRIOR THERAPY: Palliative radiotherapy to the right hilar region under the care of Dr. Lisbeth Renshaw.  CURRENT THERAPY:  systemic chemotherapy with carboplatin for AUC of 5, Alimta 500 Mg/M2 and Keytruda 200 Mg IV every 3 weeks.  First dose August 29, 2021.  Status post 15 cycles.  Starting from cycle #5 the patient is on maintenance treatment with Alimta and Keytruda every 3 weeks.  INTERVAL HISTORY: April Walsh 57 y.o. female returns to the clinic today for follow-up visit accompanied by family member.  The patient is feeling fine today with no concerning complaints but she was worried about her scan results.  She has no nausea, vomiting, diarrhea or constipation.  She has no headache or visual changes.  She denied having any significant weight loss or night sweats.  She has no fever or chills.  She is here today for evaluation with repeat CT scan of the chest, abdomen and pelvis before starting cycle #16 of her treatment.   MEDICAL HISTORY: Past Medical History:  Diagnosis Date   Anemia    Asthma    Bronchitis    Cancer ([place])    basal cell melanoma, cervical cancer   COPD (chronic obstructive pulmonary disease) ([place])    "early stages of COPD"   Headache    Lung cancer ([place])  08/13/2021    ALLERGIES:  has No Known Allergies.  MEDICATIONS:  Current Outpatient Medications  Medication Sig Dispense Refill   apixaban (ELIQUIS) 2.5 MG TABS tablet Take 1 tablet (2.5 mg total) by mouth 2 (two) times daily. 60 tablet 0   atorvastatin (LIPITOR) 80 MG tablet Take 1 tablet (80 mg total) by mouth daily. 30 tablet 3   Budeson-Glycopyrrol-Formoterol (BREZTRI AEROSPHERE) 160-9-4.8 MCG/ACT AERO Inhale 2 puffs into the lungs in the morning and at bedtime. 0000000 g 11   folic acid (FOLVITE) 1 MG tablet TAKE 1 TABLET BY MOUTH EVERY DAY 30 tablet 2   lidocaine-prilocaine (EMLA) cream Apply 1 Application topically as needed. 30 g 2   prochlorperazine (COMPAZINE) 10 MG tablet Take 1 tablet (10 mg total) by mouth every 6 (six) hours as needed. 30 tablet 2   No current facility-administered medications for this visit.   Facility-Administered Medications Ordered in Other Visits  Medication Dose Route Frequency Provider Last Rate Last Admin   cyanocobalamin (VITAMIN B12) 1000 MCG/ML injection            prochlorperazine (COMPAZINE) 10 MG tablet             SURGICAL HISTORY:  Past Surgical History:  Procedure Laterality Date   APPENDECTOMY     1996   BREAST BIOPSY Right    2010-2015   BRONCHIAL BIOPSY  08/13/2021   Procedure: BRONCHIAL BIOPSIES;  Surgeon: Collene Gobble, MD;  Location: [facility] ENDOSCOPY;  Service: Pulmonary;;   BRONCHIAL BRUSHINGS  08/13/2021  Procedure: BRONCHIAL BRUSHINGS;  Surgeon: Collene Gobble, MD;  Location: [facility] ENDOSCOPY;  Service: Pulmonary;;   BRONCHIAL NEEDLE ASPIRATION BIOPSY  08/13/2021   Procedure: BRONCHIAL NEEDLE ASPIRATION BIOPSIES;  Surgeon: Collene Gobble, MD;  Location: [facility] ENDOSCOPY;  Service: Pulmonary;;   CESAREAN SECTION     1984, 1987   FOOT SURGERY Right    2 pins and screw 1999   FOOT SURGERY Left 06/21/2021   screw and 2 pins   HEMOSTASIS CONTROL  08/13/2021   Procedure: HEMOSTASIS CONTROL;  Surgeon: Collene Gobble, MD;  Location: [facility]  ENDOSCOPY;  Service: Pulmonary;;   IR IMAGING GUIDED PORT INSERTION  11/26/2021   MELANOMA EXCISION Left    basal cell melanoma 2011   [place]   VIDEO BRONCHOSCOPY  08/13/2021   Procedure: VIDEO BRONCHOSCOPY WITHOUT FLUORO;  Surgeon: Collene Gobble, MD;  Location: [facility] ENDOSCOPY;  Service: Pulmonary;;   VIDEO BRONCHOSCOPY WITH ENDOBRONCHIAL ULTRASOUND N/A 08/13/2021   Procedure: VIDEO BRONCHOSCOPY WITH ENDOBRONCHIAL ULTRASOUND;  Surgeon: Collene Gobble, MD;  Location: [facility] ENDOSCOPY;  Service: Pulmonary;  Laterality: N/A;    REVIEW OF SYSTEMS:  Constitutional: positive for fatigue Eyes: negative Ears, nose, mouth, throat, and face: negative Respiratory: negative Cardiovascular: negative Gastrointestinal: negative Genitourinary:negative Integument/breast: negative Hematologic/lymphatic: negative Musculoskeletal:negative Neurological: negative Behavioral/Psych: negative Endocrine: negative Allergic/Immunologic: negative   PHYSICAL EXAMINATION: General appearance: alert, cooperative, and no distress Head: Normocephalic, without obvious abnormality, atraumatic Neck: no adenopathy, no JVD, supple, symmetrical, trachea midline, and thyroid not enlarged, symmetric, no tenderness/mass/nodules Lymph nodes: Cervical, supraclavicular, and axillary nodes normal. Resp: clear to auscultation bilaterally Back: symmetric, no curvature. ROM normal. No CVA tenderness. Cardio: regular rate and rhythm, S1, S2 normal, no murmur, click, rub or gallop GI: soft, non-tender; bowel sounds normal; no masses,  no organomegaly Extremities: extremities normal, atraumatic, no cyanosis or edema Neurologic: Alert and oriented X 3, normal strength and tone. Normal symmetric reflexes. Normal coordination and gait  ECOG PERFORMANCE STATUS: 1 - Symptomatic but completely ambulatory  Blood pressure 98/71, pulse 94, temperature 98.7 F (37.1 C), temperature source Oral,  resp. rate 16, weight 116 lb 2 oz (52.7 kg), SpO2 94 %.  LABORATORY DATA: Lab Results  Component Value Date   WBC 5.7 07/24/2022   HGB 10.9 (L) 07/24/2022   HCT 31.8 (L) 07/24/2022   MCV 101.6 (H) 07/24/2022   PLT 246 07/24/2022      Chemistry      Component Value Date/Time   NA 139 07/24/2022 0942   K 4.1 07/24/2022 0942   CL 110 07/24/2022 0942   CO2 25 07/24/2022 0942   BUN 14 07/24/2022 0942   CREATININE 0.65 07/24/2022 0942      Component Value Date/Time   CALCIUM 8.8 (L) 07/24/2022 0942   ALKPHOS 71 07/24/2022 0942   AST 19 07/24/2022 0942   ALT 11 07/24/2022 0942   BILITOT 0.2 (L) 07/24/2022 0942       RADIOGRAPHIC STUDIES: CT Chest W Contrast  Result Date: 07/22/2022 CLINICAL DATA:  Non-small-cell lung cancer. Restaging. * Tracking Code: BO * EXAM: CT CHEST, ABDOMEN, AND PELVIS WITH CONTRAST TECHNIQUE: Multidetector CT imaging of the chest, abdomen and pelvis was performed following the standard protocol during bolus administration of intravenous contrast. RADIATION DOSE REDUCTION: This exam was performed according to the departmental dose-optimization program which includes automated exposure control, adjustment of the mA and/or kV according to patient size and/or use  of iterative reconstruction technique. CONTRAST:  187m OMNIPAQUE IOHEXOL 300 MG/ML  SOLN COMPARISON:  04/26/2022 FINDINGS: CT CHEST FINDINGS Cardiovascular: The heart size is normal. No substantial pericardial effusion. Coronary artery calcification is evident. Mild atherosclerotic calcification is noted in the wall of the thoracic aorta. Right Port-A-Cath tip is at the SVC/RA junction. Mediastinum/Nodes: No mediastinal lymphadenopathy. There is no hilar lymphadenopathy. The esophagus has normal imaging features. There is no axillary lymphadenopathy. Lungs/Pleura: Parahilar and medial scarring in the right lung is similar to prior, including right lower lobe nodular component seen on image 72/7.  Architectural distortion/scarring anterior left upper lobe is similar to prior. No new suspicious pulmonary nodule or mass. Small right pleural effusion is progressive in the interval. Musculoskeletal: No worrisome lytic or sclerotic osseous abnormality. CT ABDOMEN PELVIS FINDINGS Hepatobiliary: 12 mm hypervascular lesion identified subcapsular right liver on image 63/2. This was not visible on the previous study or the exam from 02/04/2022. Liver otherwise unremarkable. There is no evidence for gallstones, gallbladder wall thickening, or pericholecystic fluid. No intrahepatic or extrahepatic biliary dilation. Pancreas: No focal mass lesion. No dilatation of the main duct. No intraparenchymal cyst. No peripancreatic edema. Spleen: No splenomegaly. No focal mass lesion. Adrenals/Urinary Tract: No adrenal nodule or mass. Right kidney unremarkable. Stable small cyst lower pole left kidney. No followup imaging is recommended. No evidence for hydroureter. The urinary bladder appears normal for the degree of distention. Stomach/Bowel: Stomach is unremarkable. No gastric wall thickening. No evidence of outlet obstruction. Duodenum is normally positioned as is the ligament of Treitz. No small bowel wall thickening. No small bowel dilatation. The terminal ileum is normal. The appendix is not well visualized, but there is no edema or inflammation in the region of the cecum. No gross colonic mass. No colonic wall thickening. Vascular/Lymphatic: There is mild atherosclerotic calcification of the abdominal aorta without aneurysm. There is no gastrohepatic or hepatoduodenal ligament lymphadenopathy. No retroperitoneal or mesenteric lymphadenopathy. No pelvic sidewall lymphadenopathy. Reproductive: Hysterectomy.  There is no adnexal mass. Other: No intraperitoneal free fluid. Musculoskeletal: No worrisome lytic or sclerotic osseous abnormality. IMPRESSION: 1. Small right pleural effusion is mildly progressive in the interval. 2.  12 mm hypervascular lesion identified subcapsular right liver. This was not visible on the previous study or the exam from 02/04/2022. This may well be benign and represent a flash filling hemangioma or vascular malformation visible today due to differential bolus timing. MRI of the abdomen with and without contrast recommended to further evaluate. 3. The right parahilar and medial right lung patchy and nodular consolidative opacity seen previously is stable, likely sequelae of radiation treatment. Continued attention on follow-up recommended. 4.  Aortic Atherosclerosis (ICD10-I70.0). Electronically Signed   By: EMisty StanleyM.D.   On: 07/22/2022 14:31   CT Abdomen Pelvis W Contrast  Result Date: 07/22/2022 CLINICAL DATA:  Non-small-cell lung cancer. Restaging. * Tracking Code: BO * EXAM: CT CHEST, ABDOMEN, AND PELVIS WITH CONTRAST TECHNIQUE: Multidetector CT imaging of the chest, abdomen and pelvis was performed following the standard protocol during bolus administration of intravenous contrast. RADIATION DOSE REDUCTION: This exam was performed according to the departmental dose-optimization program which includes automated exposure control, adjustment of the mA and/or kV according to patient size and/or use of iterative reconstruction technique. CONTRAST:  1025mOMNIPAQUE IOHEXOL 300 MG/ML  SOLN COMPARISON:  04/26/2022 FINDINGS: CT CHEST FINDINGS Cardiovascular: The heart size is normal. No substantial pericardial effusion. Coronary artery calcification is evident. Mild atherosclerotic calcification is noted in the wall of the thoracic  aorta. Right Port-A-Cath tip is at the SVC/RA junction. Mediastinum/Nodes: No mediastinal lymphadenopathy. There is no hilar lymphadenopathy. The esophagus has normal imaging features. There is no axillary lymphadenopathy. Lungs/Pleura: Parahilar and medial scarring in the right lung is similar to prior, including right lower lobe nodular component seen on image 72/7.  Architectural distortion/scarring anterior left upper lobe is similar to prior. No new suspicious pulmonary nodule or mass. Small right pleural effusion is progressive in the interval. Musculoskeletal: No worrisome lytic or sclerotic osseous abnormality. CT ABDOMEN PELVIS FINDINGS Hepatobiliary: 12 mm hypervascular lesion identified subcapsular right liver on image 63/2. This was not visible on the previous study or the exam from 02/04/2022. Liver otherwise unremarkable. There is no evidence for gallstones, gallbladder wall thickening, or pericholecystic fluid. No intrahepatic or extrahepatic biliary dilation. Pancreas: No focal mass lesion. No dilatation of the main duct. No intraparenchymal cyst. No peripancreatic edema. Spleen: No splenomegaly. No focal mass lesion. Adrenals/Urinary Tract: No adrenal nodule or mass. Right kidney unremarkable. Stable small cyst lower pole left kidney. No followup imaging is recommended. No evidence for hydroureter. The urinary bladder appears normal for the degree of distention. Stomach/Bowel: Stomach is unremarkable. No gastric wall thickening. No evidence of outlet obstruction. Duodenum is normally positioned as is the ligament of Treitz. No small bowel wall thickening. No small bowel dilatation. The terminal ileum is normal. The appendix is not well visualized, but there is no edema or inflammation in the region of the cecum. No gross colonic mass. No colonic wall thickening. Vascular/Lymphatic: There is mild atherosclerotic calcification of the abdominal aorta without aneurysm. There is no gastrohepatic or hepatoduodenal ligament lymphadenopathy. No retroperitoneal or mesenteric lymphadenopathy. No pelvic sidewall lymphadenopathy. Reproductive: Hysterectomy.  There is no adnexal mass. Other: No intraperitoneal free fluid. Musculoskeletal: No worrisome lytic or sclerotic osseous abnormality. IMPRESSION: 1. Small right pleural effusion is mildly progressive in the interval. 2.  12 mm hypervascular lesion identified subcapsular right liver. This was not visible on the previous study or the exam from 02/04/2022. This may well be benign and represent a flash filling hemangioma or vascular malformation visible today due to differential bolus timing. MRI of the abdomen with and without contrast recommended to further evaluate. 3. The right parahilar and medial right lung patchy and nodular consolidative opacity seen previously is stable, likely sequelae of radiation treatment. Continued attention on follow-up recommended. 4.  Aortic Atherosclerosis (ICD10-I70.0). Electronically Signed   By: Misty Stanley M.D.   On: 07/22/2022 14:31    ASSESSMENT AND PLAN: This is a very pleasant 57 years old white female with Stage IV (T2b, N2, M1 C) non-small cell lung cancer, adenocarcinoma presented with right hilar mass in addition to mediastinal and upper abdominal lymphadenopathy in addition to bilateral adrenal metastases and metastatic disease in the musculature of the lower back.  This was diagnosed in April 2023. The molecular studies by [employer] showed no actionable mutations and the patient has negative PD-L1 expression. She is currently undergoing palliative radiotherapy to the right hilar region under the care of Dr. Lisbeth Renshaw. The patient is currently on systemic chemotherapy started with carboplatin for AUC of 5, Alimta 500 Mg/M2 and Keytruda 200 Mg IV every 3 weeks and starting from cycle #5 she is on maintenance treatment with Alimta and Keytruda every 3 weeks.  Status post a total of 15 cycles. The patient has been tolerating this treatment fairly well with no concerning complaints except for mild fatigue. She had repeat CT scan of the chest, abdomen and pelvis  performed recently.  I personally and independently reviewed the scan images and discussed the results with the patient today. Her CT scan showed no concerning findings for disease recurrence or progression except for a  suspicious 1.2 cm hypervascular lesion subcapsular right liver that need close monitoring. I recommended for the patient to continue her treatment with the same regimen for now. I will see her back for follow-up visit in 3 weeks for evaluation before starting cycle #18. I will consider repeating her imaging studies in 9 weeks for restaging of her disease and close monitoring of the suspicious liver lesion. The patient was advised to call immediately if she has any other concerning symptoms in the interval. The patient voices understanding of current disease status and treatment options and is in agreement with the current care plan.  All questions were answered. The patient knows to call the clinic with any problems, questions or concerns. We can certainly see the patient much sooner if necessary.  The total time spent in the appointment was 30 minutes.  Disclaimer: This note was dictated with voice recognition software. Similar sounding words can inadvertently be transcribed and may not be corrected upon review.

## 2022-07-24 NOTE — Progress Notes (Signed)
Patient seen by MD today  Vitals are within treatment parameters.  Labs reviewed: and are within treatment parameters.  Per physician team, patient is ready for treatment and there are NO modifications to the treatment plan.  

## 2022-08-08 NOTE — Progress Notes (Signed)
[facility] OFFICE PROGRESS NOTE  April Walsh, [address] [place] [address]  DIAGNOSIS: Stage IV (T2b, N2, M1 C) non-small cell lung cancer, adenocarcinoma presented with right hilar mass in addition to mediastinal and upper abdominal lymphadenopathy in addition to bilateral adrenal metastases and metastatic disease in the musculature of the lower back.  This was diagnosed in April 2023.   DETECTED ALTERATION(S) / BIOMARKER(S)     % CFDNA OR AMPLIFICATION        ASSOCIATED FDA-APPROVED THERAPIES         CLINICAL TRIAL AVAILABILITY DO:[id number] 0.5% None     Yes [id number] (Spl[facility]) [id number] None     Yes   PD-L1 expression 0%  PRIOR THERAPY: Palliative radiotherapy to the right hilar region under the care of April Walsh.   CURRENT THERAPY: Systemic chemotherapy with carboplatin for AUC of 5, Alimta 500 Mg/M2 and Keytruda 200 Mg IV every 3 weeks.  First dose August 29, 2021. Status post 16 cycles.  Starting from cycle #5, the patient started maintenance Alimta and Keytruda.    INTERVAL HISTORY: April Walsh 57 y.o. female returns to the clinic today for a follow-up visit. The patient is feeling fairly well today without any concerning complaints. The patient is currently undergoing chemotherapy and immunotherapy.  She denies any fever, chills, or night sweats. She lost weight but attributes it to getting a new partial plate and she is getting used to them and new eating habits. Denies changes in her baseline dyspnea on exertion. Denies significant cough mild intermittent cough due to pollen. She denies any chest pain or hemoptysis. Denies any recent nausea, vomiting, diarrhea, or constipation.  Denies any headache or visual changes.  She denies any rashes or skin changes.  At her last appointment, she had a restaging CT scan which showed a slightly enlarging liver lesion which requires close monitoring. She is here today for evaluation and repeat  blood work before starting cycle #17.      MEDICAL HISTORY: Past Medical History:  Diagnosis Date   Anemia    Asthma    Bronchitis    Cancer ([place])    basal cell melanoma, cervical cancer   COPD (chronic obstructive pulmonary disease) ([place])    "early stages of COPD"   Headache    Lung cancer ([place]) 08/13/2021    ALLERGIES:  has No Known Allergies.  MEDICATIONS:  Current Outpatient Medications  Medication Sig Dispense Refill   apixaban (ELIQUIS) 2.5 MG TABS tablet Take 1 tablet (2.5 mg total) by mouth 2 (two) times daily. 60 tablet 0   atorvastatin (LIPITOR) 80 MG tablet Take 1 tablet (80 mg total) by mouth daily. 30 tablet 3   Budeson-Glycopyrrol-Formoterol (BREZTRI AEROSPHERE) 160-9-4.8 MCG/ACT AERO Inhale 2 puffs into the lungs in the morning and at bedtime. 0000000 g 11   folic acid (FOLVITE) 1 MG tablet TAKE 1 TABLET BY MOUTH EVERY DAY 30 tablet 2   lidocaine-prilocaine (EMLA) cream Apply 1 Application topically as needed. 30 g 2   prochlorperazine (COMPAZINE) 10 MG tablet Take 1 tablet (10 mg total) by mouth every 6 (six) hours as needed. 30 tablet 2   No current facility-administered medications for this visit.   Facility-Administered Medications Ordered in Other Visits  Medication Dose Route Frequency Provider Last Rate Last Admin   cyanocobalamin (VITAMIN B12) 1000 MCG/ML injection            prochlorperazine (COMPAZINE) 10 MG tablet  SURGICAL HISTORY:  Past Surgical History:  Procedure Laterality Date   APPENDECTOMY     1996   BREAST BIOPSY Right    2010-2015   BRONCHIAL BIOPSY  08/13/2021   Procedure: BRONCHIAL BIOPSIES;  Surgeon: April Gobble, MD;  Location: [facility] ENDOSCOPY;  Service: Pulmonary;;   BRONCHIAL BRUSHINGS  08/13/2021   Procedure: BRONCHIAL BRUSHINGS;  Surgeon: April Gobble, MD;  Location: [facility] ENDOSCOPY;  Service: Pulmonary;;   BRONCHIAL NEEDLE ASPIRATION BIOPSY  08/13/2021   Procedure: BRONCHIAL NEEDLE ASPIRATION BIOPSIES;  Surgeon: April Gobble, MD;  Location: [facility] ENDOSCOPY;  Service: Pulmonary;;   CESAREAN [place] Right    2 pins and screw 1999   FOOT SURGERY Left 06/21/2021   screw and 2 pins   HEMOSTASIS CONTROL  08/13/2021   Procedure: HEMOSTASIS CONTROL;  Surgeon: April Gobble, MD;  Location: [facility] ENDOSCOPY;  Service: Pulmonary;;   IR IMAGING GUIDED PORT INSERTION  11/26/2021   MELANOMA EXCISION Left    basal cell melanoma 2011   [facility]   VIDEO BRONCHOSCOPY  08/13/2021   Procedure: VIDEO BRONCHOSCOPY WITHOUT FLUORO;  Surgeon: April Gobble, MD;  Location: [facility] ENDOSCOPY;  Service: Pulmonary;;   VIDEO BRONCHOSCOPY WITH ENDOBRONCHIAL ULTRASOUND N/A 08/13/2021   Procedure: VIDEO BRONCHOSCOPY WITH ENDOBRONCHIAL ULTRASOUND;  Surgeon: April Gobble, MD;  Location: [place] ENDOSCOPY;  Service: Pulmonary;  Laterality: N/A;    REVIEW OF SYSTEMS:   Review of Systems  Constitutional: Negative for appetite change, chills, fatigue, fever and unexpected weight change.  HENT:   Negative for mouth sores, nosebleeds, sore throat and trouble swallowing.   Eyes: Negative for eye problems and icterus.  Respiratory: Negative for cough (not constant), hemoptysis, shortness of breath and wheezing.   Cardiovascular: Negative for chest pain and leg swelling.  Gastrointestinal: Negative for abdominal pain, constipation, diarrhea, nausea and vomiting.  Genitourinary: Negative for bladder incontinence, difficulty urinating, dysuria, frequency and hematuria.   Musculoskeletal: Negative for back pain, gait problem, neck pain and neck stiffness.  Skin: Negative for itching and rash.  Neurological: Negative for dizziness, extremity weakness, gait problem, headaches, light-headedness and seizures.  Hematological: Negative for adenopathy. Does not bruise/bleed easily.  Psychiatric/Behavioral: Negative for confusion, depression and sleep disturbance. The patient is not  nervous/anxious.     PHYSICAL EXAMINATION:  Blood pressure 108/75, pulse 98, temperature 98.1 F (36.7 C), resp. rate 20, weight 111 lb (50.3 kg), SpO2 97 %.  ECOG PERFORMANCE STATUS: 1  Physical Exam  Constitutional: Oriented to person, place, and time and positive for thin appearing female and in no acute distress.   HENT:  Head: Normocephalic and atraumatic.  Mouth/Throat: Oropharynx is clear and moist. No oropharyngeal exudate.  Eyes: Conjunctivae are normal. Right eye exhibits no discharge. Left eye exhibits no discharge. No scleral icterus.  Neck: Normal range of motion. Neck supple.  Cardiovascular: Normal rate, regular rhythm, normal heart sounds and intact distal pulses.   Pulmonary/Chest: Effort normal and breath sounds normal. No respiratory distress. No wheezes. No rales.  Abdominal: Soft. Bowel sounds are normal. Exhibits no distension and no mass. There is no tenderness.  Musculoskeletal: Normal range of motion. Exhibits no edema.  Lymphadenopathy:    No cervical adenopathy.  Neurological: Alert and oriented to person, place, and time. Exhibits normal muscle tone. Gait normal. Coordination normal.  Skin: Not diaphoretic. No erythema. No pallor.  Psychiatric: Mood, memory and judgment normal.  Vitals reviewed.  LABORATORY DATA: Lab Results  Component Value Date   WBC 5.9 08/14/2022   HGB 11.4 (L) 08/14/2022   HCT 33.6 (L) 08/14/2022   MCV 101.5 (H) 08/14/2022   PLT 291 08/14/2022      Chemistry      Component Value Date/Time   NA 137 08/14/2022 0852   K 4.3 08/14/2022 0852   CL 108 08/14/2022 0852   CO2 24 08/14/2022 0852   BUN 13 08/14/2022 0852   CREATININE 0.74 08/14/2022 0852      Component Value Date/Time   CALCIUM 9.2 08/14/2022 0852   ALKPHOS 71 08/14/2022 0852   AST 20 08/14/2022 0852   ALT 11 08/14/2022 0852   BILITOT 0.2 (L) 08/14/2022 0852       RADIOGRAPHIC STUDIES:  CT Chest W Contrast  Result Date: 07/22/2022 CLINICAL DATA:   Non-small-cell lung cancer. Restaging. * Tracking Code: BO * EXAM: CT CHEST, ABDOMEN, AND PELVIS WITH CONTRAST TECHNIQUE: Multidetector CT imaging of the chest, abdomen and pelvis was performed following the standard protocol during bolus administration of intravenous contrast. RADIATION DOSE REDUCTION: This exam was performed according to the departmental dose-optimization program which includes automated exposure control, adjustment of the mA and/or kV according to patient size and/or use of iterative reconstruction technique. CONTRAST:  132mL OMNIPAQUE IOHEXOL 300 MG/ML  SOLN COMPARISON:  04/26/2022 FINDINGS: CT CHEST FINDINGS Cardiovascular: The heart size is normal. No substantial pericardial effusion. Coronary artery calcification is evident. Mild atherosclerotic calcification is noted in the wall of the thoracic aorta. Right Port-A-Cath tip is at the SVC/RA junction. Mediastinum/Nodes: No mediastinal lymphadenopathy. There is no hilar lymphadenopathy. The esophagus has normal imaging features. There is no axillary lymphadenopathy. Lungs/Pleura: Parahilar and medial scarring in the right lung is similar to prior, including right lower lobe nodular component seen on image 72/7. Architectural distortion/scarring anterior left upper lobe is similar to prior. No new suspicious pulmonary nodule or mass. Small right pleural effusion is progressive in the interval. Musculoskeletal: No worrisome lytic or sclerotic osseous abnormality. CT ABDOMEN PELVIS FINDINGS Hepatobiliary: 12 mm hypervascular lesion identified subcapsular right liver on image 63/2. This was not visible on the previous study or the exam from 02/04/2022. Liver otherwise unremarkable. There is no evidence for gallstones, gallbladder wall thickening, or pericholecystic fluid. No intrahepatic or extrahepatic biliary dilation. Pancreas: No focal mass lesion. No dilatation of the main duct. No intraparenchymal cyst. No peripancreatic edema. Spleen: No  splenomegaly. No focal mass lesion. Adrenals/Urinary Tract: No adrenal nodule or mass. Right kidney unremarkable. Stable small cyst lower pole left kidney. No followup imaging is recommended. No evidence for hydroureter. The urinary bladder appears normal for the degree of distention. Stomach/Bowel: Stomach is unremarkable. No gastric wall thickening. No evidence of outlet obstruction. Duodenum is normally positioned as is the ligament of Treitz. No small bowel wall thickening. No small bowel dilatation. The terminal ileum is normal. The appendix is not well visualized, but there is no edema or inflammation in the region of the cecum. No gross colonic mass. No colonic wall thickening. Vascular/Lymphatic: There is mild atherosclerotic calcification of the abdominal aorta without aneurysm. There is no gastrohepatic or hepatoduodenal ligament lymphadenopathy. No retroperitoneal or mesenteric lymphadenopathy. No pelvic sidewall lymphadenopathy. Reproductive: Hysterectomy.  There is no adnexal mass. Other: No intraperitoneal free fluid. Musculoskeletal: No worrisome lytic or sclerotic osseous abnormality. IMPRESSION: 1. Small right pleural effusion is mildly progressive in the interval. 2. 12 mm hypervascular lesion identified  subcapsular right liver. This was not visible on the previous study or the exam from 02/04/2022. This may well be benign and represent a flash filling hemangioma or vascular malformation visible today due to differential bolus timing. MRI of the abdomen with and without contrast recommended to further evaluate. 3. The right parahilar and medial right lung patchy and nodular consolidative opacity seen previously is stable, likely sequelae of radiation treatment. Continued attention on follow-up recommended. 4.  Aortic Atherosclerosis (ICD10-I70.0). Electronically Signed   By: Misty Stanley M.D.   On: 07/22/2022 14:31   CT Abdomen Pelvis W Contrast  Result Date: 07/22/2022 CLINICAL DATA:   Non-small-cell lung cancer. Restaging. * Tracking Code: BO * EXAM: CT CHEST, ABDOMEN, AND PELVIS WITH CONTRAST TECHNIQUE: Multidetector CT imaging of the chest, abdomen and pelvis was performed following the standard protocol during bolus administration of intravenous contrast. RADIATION DOSE REDUCTION: This exam was performed according to the departmental dose-optimization program which includes automated exposure control, adjustment of the mA and/or kV according to patient size and/or use of iterative reconstruction technique. CONTRAST:  119mL OMNIPAQUE IOHEXOL 300 MG/ML  SOLN COMPARISON:  04/26/2022 FINDINGS: CT CHEST FINDINGS Cardiovascular: The heart size is normal. No substantial pericardial effusion. Coronary artery calcification is evident. Mild atherosclerotic calcification is noted in the wall of the thoracic aorta. Right Port-A-Cath tip is at the SVC/RA junction. Mediastinum/Nodes: No mediastinal lymphadenopathy. There is no hilar lymphadenopathy. The esophagus has normal imaging features. There is no axillary lymphadenopathy. Lungs/Pleura: Parahilar and medial scarring in the right lung is similar to prior, including right lower lobe nodular component seen on image 72/7. Architectural distortion/scarring anterior left upper lobe is similar to prior. No new suspicious pulmonary nodule or mass. Small right pleural effusion is progressive in the interval. Musculoskeletal: No worrisome lytic or sclerotic osseous abnormality. CT ABDOMEN PELVIS FINDINGS Hepatobiliary: 12 mm hypervascular lesion identified subcapsular right liver on image 63/2. This was not visible on the previous study or the exam from 02/04/2022. Liver otherwise unremarkable. There is no evidence for gallstones, gallbladder wall thickening, or pericholecystic fluid. No intrahepatic or extrahepatic biliary dilation. Pancreas: No focal mass lesion. No dilatation of the main duct. No intraparenchymal cyst. No peripancreatic edema. Spleen: No  splenomegaly. No focal mass lesion. Adrenals/Urinary Tract: No adrenal nodule or mass. Right kidney unremarkable. Stable small cyst lower pole left kidney. No followup imaging is recommended. No evidence for hydroureter. The urinary bladder appears normal for the degree of distention. Stomach/Bowel: Stomach is unremarkable. No gastric wall thickening. No evidence of outlet obstruction. Duodenum is normally positioned as is the ligament of Treitz. No small bowel wall thickening. No small bowel dilatation. The terminal ileum is normal. The appendix is not well visualized, but there is no edema or inflammation in the region of the cecum. No gross colonic mass. No colonic wall thickening. Vascular/Lymphatic: There is mild atherosclerotic calcification of the abdominal aorta without aneurysm. There is no gastrohepatic or hepatoduodenal ligament lymphadenopathy. No retroperitoneal or mesenteric lymphadenopathy. No pelvic sidewall lymphadenopathy. Reproductive: Hysterectomy.  There is no adnexal mass. Other: No intraperitoneal free fluid. Musculoskeletal: No worrisome lytic or sclerotic osseous abnormality. IMPRESSION: 1. Small right pleural effusion is mildly progressive in the interval. 2. 12 mm hypervascular lesion identified subcapsular right liver. This was not visible on the previous study or the exam from 02/04/2022. This may well be benign and represent a flash filling hemangioma or vascular malformation visible today due to differential bolus timing. MRI of the abdomen with and without contrast recommended  to further evaluate. 3. The right parahilar and medial right lung patchy and nodular consolidative opacity seen previously is stable, likely sequelae of radiation treatment. Continued attention on follow-up recommended. 4.  Aortic Atherosclerosis (ICD10-I70.0). Electronically Signed   By: Misty Stanley M.D.   On: 07/22/2022 14:31     ASSESSMENT/PLAN:  This is a very pleasant 57 years old white female with  Stage IV (T2b, N2, M1 C) non-small cell lung cancer, adenocarcinoma presented with right hilar mass in addition to mediastinal and upper abdominal lymphadenopathy in addition to bilateral adrenal metastases and metastatic disease in the musculature of the lower back.  This was diagnosed in April 2023.   The molecular studies by [email or web address] showed no actionable mutations and the patient has negative PD-L1 expression. She completed palliative radiotherapy to the right hilar region under the care of April Walsh.   She completed palliative radiation to the right hilar area under the care of April Walsh.   She is currently undergoing palliative systemic chemotherapy with carboplatin for an AUC of 5, Alimta 500 mg per metered squared, Keytruda 200 mg IV every 3 weeks.  She is status post 16 cycles.  Her first dose was on 08/29/2021. Starting from cycle #5, she started maintenance alimta and [place].   At her last appointment, Dr. Worthy Flank last note mentioned possibly arraning close follow up CT scan. Therefore, we will arrange for a repeat CT scan after her next infusion.   Will see her back in 3 weeks for evaluation before starting cycle #18.  The patient was advised to call immediately if she has any concerning symptoms in the interval. The patient voices understanding of current disease status and treatment options and is in agreement with the current care plan. All questions were answered. The patient knows to call the clinic with any problems, questions or concerns. We can certainly see the patient much sooner if necessary      No orders of the defined types were placed in this encounter.    The total time spent in the appointment was 20-29 minutes.   Kiree Dejarnette L Mahayla Haddaway, PA-C 08/14/22

## 2022-08-14 ENCOUNTER — Inpatient Hospital Stay: Payer: BC Managed Care – PPO | Attending: Radiation Oncology | Admitting: Physician Assistant

## 2022-08-14 ENCOUNTER — Inpatient Hospital Stay: Payer: BC Managed Care – PPO

## 2022-08-14 ENCOUNTER — Other Ambulatory Visit: Payer: Self-pay

## 2022-08-14 VITALS — BP 108/75 | HR 98 | Temp 98.1°F | Resp 20 | Wt 111.0 lb

## 2022-08-14 DIAGNOSIS — E538 Deficiency of other specified B group vitamins: Secondary | ICD-10-CM | POA: Diagnosis not present

## 2022-08-14 DIAGNOSIS — Z923 Personal history of irradiation: Secondary | ICD-10-CM | POA: Diagnosis not present

## 2022-08-14 DIAGNOSIS — J90 Pleural effusion, not elsewhere classified: Secondary | ICD-10-CM | POA: Insufficient documentation

## 2022-08-14 DIAGNOSIS — R634 Abnormal weight loss: Secondary | ICD-10-CM | POA: Insufficient documentation

## 2022-08-14 DIAGNOSIS — Z9049 Acquired absence of other specified parts of digestive tract: Secondary | ICD-10-CM | POA: Diagnosis not present

## 2022-08-14 DIAGNOSIS — Z8541 Personal history of malignant neoplasm of cervix uteri: Secondary | ICD-10-CM | POA: Diagnosis not present

## 2022-08-14 DIAGNOSIS — C7972 Secondary malignant neoplasm of left adrenal gland: Secondary | ICD-10-CM | POA: Insufficient documentation

## 2022-08-14 DIAGNOSIS — Z9851 Tubal ligation status: Secondary | ICD-10-CM | POA: Diagnosis not present

## 2022-08-14 DIAGNOSIS — Z5112 Encounter for antineoplastic immunotherapy: Secondary | ICD-10-CM | POA: Diagnosis not present

## 2022-08-14 DIAGNOSIS — C3491 Malignant neoplasm of unspecified part of right bronchus or lung: Secondary | ICD-10-CM

## 2022-08-14 DIAGNOSIS — C7971 Secondary malignant neoplasm of right adrenal gland: Secondary | ICD-10-CM | POA: Insufficient documentation

## 2022-08-14 DIAGNOSIS — Z85820 Personal history of malignant melanoma of skin: Secondary | ICD-10-CM | POA: Diagnosis not present

## 2022-08-14 DIAGNOSIS — Z79899 Other long term (current) drug therapy: Secondary | ICD-10-CM | POA: Diagnosis not present

## 2022-08-14 DIAGNOSIS — Z5111 Encounter for antineoplastic chemotherapy: Secondary | ICD-10-CM | POA: Diagnosis not present

## 2022-08-14 DIAGNOSIS — Z79620 Long term (current) use of immunosuppressive biologic: Secondary | ICD-10-CM | POA: Diagnosis not present

## 2022-08-14 LAB — CBC WITH DIFFERENTIAL (CANCER CENTER ONLY)
Abs Immature Granulocytes: 0.03 10*3/uL (ref 0.00–0.07)
Basophils Absolute: 0.1 10*3/uL (ref 0.0–0.1)
Basophils Relative: 2 %
Eosinophils Absolute: 0.2 10*3/uL (ref 0.0–0.5)
Eosinophils Relative: 4 %
HCT: 33.6 % — ABNORMAL LOW (ref 36.0–46.0)
Hemoglobin: 11.4 g/dL — ABNORMAL LOW (ref 12.0–15.0)
Immature Granulocytes: 1 %
Lymphocytes Relative: 28 %
Lymphs Abs: 1.7 10*3/uL (ref 0.7–4.0)
MCH: 34.4 pg — ABNORMAL HIGH (ref 26.0–34.0)
MCHC: 33.9 g/dL (ref 30.0–36.0)
MCV: 101.5 fL — ABNORMAL HIGH (ref 80.0–100.0)
Monocytes Absolute: 1 10*3/uL (ref 0.1–1.0)
Monocytes Relative: 17 %
Neutro Abs: 2.9 10*3/uL (ref 1.7–7.7)
Neutrophils Relative %: 48 %
Platelet Count: 291 10*3/uL (ref 150–400)
RBC: 3.31 MIL/uL — ABNORMAL LOW (ref 3.87–5.11)
RDW: 15.2 % (ref 11.5–15.5)
Smear Review: NORMAL
WBC Count: 5.9 10*3/uL (ref 4.0–10.5)
nRBC: 0 % (ref 0.0–0.2)

## 2022-08-14 LAB — CMP (CANCER CENTER ONLY)
ALT: 11 U/L (ref 0–44)
AST: 20 U/L (ref 15–41)
Albumin: 3.6 g/dL (ref 3.5–5.0)
Alkaline Phosphatase: 71 U/L (ref 38–126)
Anion gap: 5 (ref 5–15)
BUN: 13 mg/dL (ref 6–20)
CO2: 24 mmol/L (ref 22–32)
Calcium: 9.2 mg/dL (ref 8.9–10.3)
Chloride: 108 mmol/L (ref 98–111)
Creatinine: 0.74 mg/dL (ref 0.44–1.00)
GFR, Estimated: >60 mL/min (ref >60)
Glucose, Bld: 98 mg/dL (ref 70–99)
Potassium: 4.3 mmol/L (ref 3.5–5.1)
Sodium: 137 mmol/L (ref 135–145)
Total Bilirubin: 0.2 mg/dL — ABNORMAL LOW (ref 0.3–1.2)
Total Protein: 6.6 g/dL (ref 6.5–8.1)

## 2022-08-14 LAB — TSH: TSH: 0.796 u[IU]/mL (ref 0.350–4.500)

## 2022-08-14 MED ORDER — SODIUM CHLORIDE 0.9 % IV SOLN
200.0000 mg | Freq: Once | INTRAVENOUS | Status: AC
  Administered 2022-08-14: 200 mg via INTRAVENOUS
  Filled 2022-08-14: qty 200

## 2022-08-14 MED ORDER — SODIUM CHLORIDE 0.9 % IV SOLN
500.0000 mg/m2 | Freq: Once | INTRAVENOUS | Status: AC
  Administered 2022-08-14: 700 mg via INTRAVENOUS
  Filled 2022-08-14: qty 20

## 2022-08-14 MED ORDER — PROCHLORPERAZINE MALEATE 10 MG PO TABS
10.0000 mg | ORAL_TABLET | Freq: Once | ORAL | Status: AC
  Administered 2022-08-14: 10 mg via ORAL

## 2022-08-14 MED ORDER — SODIUM CHLORIDE 0.9 % IV SOLN: Freq: Once | INTRAVENOUS | Status: AC

## 2022-08-14 NOTE — Progress Notes (Signed)
Patient seen by Cassie Heilingoetter, PA-C  Vitals are within treatment parameters.  Labs reviewed: and are within treatment parameters.  Per physician team, patient is ready for treatment and there are NO modifications to the treatment plan.  

## 2022-08-27 ENCOUNTER — Other Ambulatory Visit: Payer: Self-pay | Admitting: Neurology

## 2022-08-30 NOTE — Progress Notes (Unsigned)
[facility] OFFICE PROGRESS NOTE  [name], FNP [address] [place] [address]  DIAGNOSIS: Stage IV (T2b, N2, M1 C) non-small cell lung cancer, adenocarcinoma presented with right hilar mass in addition to mediastinal and upper abdominal lymphadenopathy in addition to bilateral adrenal metastases and metastatic disease in the musculature of the lower back.  This was diagnosed in April 2023.   DETECTED ALTERATION(S) / BIOMARKER(S)     % CFDNA OR AMPLIFICATION        ASSOCIATED FDA-APPROVED THERAPIES         CLINICAL TRIAL AVAILABILITY [id number] 0.5% None     Yes [id number] (Splice Site Indel) 0.6% None     Yes   PD-L1 expression 0%    PRIOR THERAPY: Palliative radiotherapy to the right hilar region under the care of Dr. Mitzi Hansen   CURRENT THERAPY: Systemic chemotherapy with carboplatin for AUC of 5, Alimta 500 Mg/M2 and Keytruda 200 Mg IV every 3 weeks.  First dose August 29, 2021. Status post 17 cycles.  Starting from cycle #5, the patient started maintenance Alimta and Keytruda.    INTERVAL HISTORY: April Walsh 57 y.o. female returns to the clinic today for a follow-up visit. The patient is feeling fairly well today without any concerning complaints. The patient is currently undergoing chemotherapy and immunotherapy.  She denies any fever, chills, or night sweats. She lost weight but attributes it to getting a new partial plate and she is getting used to them and new eating habits. Denies changes in her baseline dyspnea on exertion. Denies significant cough mild intermittent cough due to pollen. She denies any chest pain or hemoptysis. Denies any recent nausea, vomiting, diarrhea, or constipation.  Denies any headache or visual changes.  She denies any rashes or skin changes.  Her most recent restaging CT scan which showed a slightly enlarging liver lesion which requires close monitoring ***order today***. She is here today for evaluation and repeat  blood work before starting cycle #18.     MEDICAL HISTORY: Past Medical History:  Diagnosis Date   Anemia    Asthma    Bronchitis    Cancer (HCC)    basal cell melanoma, cervical cancer   COPD (chronic obstructive pulmonary disease) (HCC)    "early stages of COPD"   Headache    Lung cancer (HCC) 08/13/2021    ALLERGIES:  has No Known Allergies.  MEDICATIONS:  Current Outpatient Medications  Medication Sig Dispense Refill   apixaban (ELIQUIS) 2.5 MG TABS tablet Take 1 tablet (2.5 mg total) by mouth 2 (two) times daily. 60 tablet 0   atorvastatin (LIPITOR) 80 MG tablet TAKE 1 TABLET BY MOUTH EVERY DAY 30 tablet 3   Budeson-Glycopyrrol-Formoterol (BREZTRI AEROSPHERE) 160-9-4.8 MCG/ACT AERO Inhale 2 puffs into the lungs in the morning and at bedtime. 10.7 g 11   folic acid (FOLVITE) 1 MG tablet TAKE 1 TABLET BY MOUTH EVERY DAY 30 tablet 2   lidocaine-prilocaine (EMLA) cream Apply 1 Application topically as needed. 30 g 2   prochlorperazine (COMPAZINE) 10 MG tablet Take 1 tablet (10 mg total) by mouth every 6 (six) hours as needed. 30 tablet 2   No current facility-administered medications for this visit.   Facility-Administered Medications Ordered in Other Visits  Medication Dose Route Frequency Provider Last Rate Last Admin   cyanocobalamin (VITAMIN B12) 1000 MCG/ML injection            prochlorperazine (COMPAZINE) 10 MG tablet  SURGICAL HISTORY:  Past Surgical History:  Procedure Laterality Date   APPENDECTOMY     1996   BREAST BIOPSY Right    2010-2015   BRONCHIAL BIOPSY  08/13/2021   Procedure: BRONCHIAL BIOPSIES;  Surgeon: Leslye Peer, MD;  Location: [facility] ENDOSCOPY;  Service: Pulmonary;;   BRONCHIAL BRUSHINGS  08/13/2021   Procedure: BRONCHIAL BRUSHINGS;  Surgeon: Leslye Peer, MD;  Location: [facility] ENDOSCOPY;  Service: Pulmonary;;   BRONCHIAL NEEDLE ASPIRATION BIOPSY  08/13/2021   Procedure: BRONCHIAL NEEDLE ASPIRATION BIOPSIES;  Surgeon: Leslye Peer, MD;   Location: [facility] ENDOSCOPY;  Service: Pulmonary;;   CESAREAN SECTION     1984, 1987   FOOT SURGERY Right    2 pins and screw 1999   FOOT SURGERY Left 06/21/2021   screw and 2 pins   HEMOSTASIS CONTROL  08/13/2021   Procedure: HEMOSTASIS CONTROL;  Surgeon: Leslye Peer, MD;  Location: [facility] ENDOSCOPY;  Service: Pulmonary;;   IR IMAGING GUIDED PORT INSERTION  11/26/2021   MELANOMA EXCISION Left    basal cell melanoma 2011   PARTIAL HYSTERECTOMY     1994   TUBAL LIGATION     1987   VIDEO BRONCHOSCOPY  08/13/2021   Procedure: VIDEO BRONCHOSCOPY WITHOUT FLUORO;  Surgeon: Leslye Peer, MD;  Location: [facility] ENDOSCOPY;  Service: Pulmonary;;   VIDEO BRONCHOSCOPY WITH ENDOBRONCHIAL ULTRASOUND N/A 08/13/2021   Procedure: VIDEO BRONCHOSCOPY WITH ENDOBRONCHIAL ULTRASOUND;  Surgeon: Leslye Peer, MD;  Location: [facility] ENDOSCOPY;  Service: Pulmonary;  Laterality: N/A;    REVIEW OF SYSTEMS:   Review of Systems  Constitutional: Negative for appetite change, chills, fatigue, fever and unexpected weight change.  HENT:   Negative for mouth sores, nosebleeds, sore throat and trouble swallowing.   Eyes: Negative for eye problems and icterus.  Respiratory: Negative for cough, hemoptysis, shortness of breath and wheezing.   Cardiovascular: Negative for chest pain and leg swelling.  Gastrointestinal: Negative for abdominal pain, constipation, diarrhea, nausea and vomiting.  Genitourinary: Negative for bladder incontinence, difficulty urinating, dysuria, frequency and hematuria.   Musculoskeletal: Negative for back pain, gait problem, neck pain and neck stiffness.  Skin: Negative for itching and rash.  Neurological: Negative for dizziness, extremity weakness, gait problem, headaches, light-headedness and seizures.  Hematological: Negative for adenopathy. Does not bruise/bleed easily.  Psychiatric/Behavioral: Negative for confusion, depression and sleep disturbance. The patient is not nervous/anxious.     PHYSICAL  EXAMINATION:  There were no vitals taken for this visit.  ECOG PERFORMANCE STATUS: {CHL ONC ECOG [id number]  Physical Exam  Constitutional: Oriented to person, place, and time and well-developed, well-nourished, and in no distress. No distress.  HENT:  Head: Normocephalic and atraumatic.  Mouth/Throat: Oropharynx is clear and moist. No oropharyngeal exudate.  Eyes: Conjunctivae are normal. Right eye exhibits no discharge. Left eye exhibits no discharge. No scleral icterus.  Neck: Normal range of motion. Neck supple.  Cardiovascular: Normal rate, regular rhythm, normal heart sounds and intact distal pulses.   Pulmonary/Chest: Effort normal and breath sounds normal. No respiratory distress. No wheezes. No rales.  Abdominal: Soft. Bowel sounds are normal. Exhibits no distension and no mass. There is no tenderness.  Musculoskeletal: Normal range of motion. Exhibits no edema.  Lymphadenopathy:    No cervical adenopathy.  Neurological: Alert and oriented to person, place, and time. Exhibits normal muscle tone. Gait normal. Coordination normal.  Skin: Skin is warm and dry. No rash noted. Not diaphoretic. No erythema. No pallor.  Psychiatric: Mood, memory and judgment normal.  Vitals reviewed.  LABORATORY DATA: Lab Results  Component Value Date   WBC 5.9 08/14/2022   HGB 11.4 (L) 08/14/2022   HCT 33.6 (L) 08/14/2022   MCV 101.5 (H) 08/14/2022   PLT 291 08/14/2022      Chemistry      Component Value Date/Time   NA 137 08/14/2022 0852   K 4.3 08/14/2022 0852   CL 108 08/14/2022 0852   CO2 24 08/14/2022 0852   BUN 13 08/14/2022 0852   CREATININE 0.74 08/14/2022 0852      Component Value Date/Time   CALCIUM 9.2 08/14/2022 0852   ALKPHOS 71 08/14/2022 0852   AST 20 08/14/2022 0852   ALT 11 08/14/2022 0852   BILITOT 0.2 (L) 08/14/2022 0852       RADIOGRAPHIC STUDIES:  No results found.   ASSESSMENT/PLAN:  This is a very pleasant 57 years old white female with Stage IV  (T2b, N2, M1 C) non-small cell lung cancer, adenocarcinoma presented with right hilar mass in addition to mediastinal and upper abdominal lymphadenopathy in addition to bilateral adrenal metastases and metastatic disease in the musculature of the lower back.  This was diagnosed in April 2023.   The molecular studies by [employer] showed no actionable mutations and the patient has negative PD-L1 expression. She completed palliative radiotherapy to the right hilar region under the care of Dr. Mitzi Hansen.   She completed palliative radiation to the right hilar area under the care of Dr. Mitzi Hansen.   She is currently undergoing palliative systemic chemotherapy with carboplatin for an AUC of 5, Alimta 500 mg per metered squared, Keytruda 200 mg IV every 3 weeks.  She is status post 17 cycles.  Her first dose was on 08/29/2021. Starting from cycle #5, she started maintenance alimta and [place].    Her last restaging CT scan showed ***liver lesions which require close monitoring.  Therefore I will arrange for a repeat CT scan of the chest, abdomen, pelvis prior to her next appointment.   Labs were reviewed.  Recommend that she ***with cycle #18 today's schedule.     Will see her back in 3 weeks for evaluation before starting cycle #19 we will review her scan results at her next appointment.   The patient was advised to call immediately if she has any concerning symptoms in the interval. The patient voices understanding of current disease status and treatment options and is in agreement with the current care plan. All questions were answered. The patient knows to call the clinic with any problems, questions or concerns. We can certainly see the patient much sooner if necessary   No orders of the defined types were placed in this encounter.    I spent {CHL ONC TIME VISIT - WUJWJ:[id number]} counseling the patient face to face. The total time spent in the appointment was {CHL ONC TIME VISIT -  OZHYQ:[id number]}.  Nechemia Chiappetta L Trulee Hamstra, PA-C 08/30/22

## 2022-09-04 ENCOUNTER — Inpatient Hospital Stay: Payer: BC Managed Care – PPO

## 2022-09-04 ENCOUNTER — Inpatient Hospital Stay (HOSPITAL_BASED_OUTPATIENT_CLINIC_OR_DEPARTMENT_OTHER): Payer: BC Managed Care – PPO | Admitting: Physician Assistant

## 2022-09-04 ENCOUNTER — Other Ambulatory Visit: Payer: Self-pay

## 2022-09-04 VITALS — BP 104/68 | HR 98 | Temp 97.5°F | Resp 14 | Ht 60.0 in | Wt 113.7 lb

## 2022-09-04 DIAGNOSIS — C3491 Malignant neoplasm of unspecified part of right bronchus or lung: Secondary | ICD-10-CM

## 2022-09-04 DIAGNOSIS — Z5112 Encounter for antineoplastic immunotherapy: Secondary | ICD-10-CM

## 2022-09-04 DIAGNOSIS — Z79899 Other long term (current) drug therapy: Secondary | ICD-10-CM | POA: Diagnosis not present

## 2022-09-04 DIAGNOSIS — C7971 Secondary malignant neoplasm of right adrenal gland: Secondary | ICD-10-CM | POA: Diagnosis not present

## 2022-09-04 DIAGNOSIS — Z9049 Acquired absence of other specified parts of digestive tract: Secondary | ICD-10-CM | POA: Diagnosis not present

## 2022-09-04 DIAGNOSIS — Z8541 Personal history of malignant neoplasm of cervix uteri: Secondary | ICD-10-CM | POA: Diagnosis not present

## 2022-09-04 DIAGNOSIS — J90 Pleural effusion, not elsewhere classified: Secondary | ICD-10-CM | POA: Diagnosis not present

## 2022-09-04 DIAGNOSIS — Z95828 Presence of other vascular implants and grafts: Secondary | ICD-10-CM

## 2022-09-04 DIAGNOSIS — Z5111 Encounter for antineoplastic chemotherapy: Secondary | ICD-10-CM | POA: Diagnosis not present

## 2022-09-04 DIAGNOSIS — Z85820 Personal history of malignant melanoma of skin: Secondary | ICD-10-CM | POA: Diagnosis not present

## 2022-09-04 DIAGNOSIS — Z9851 Tubal ligation status: Secondary | ICD-10-CM | POA: Diagnosis not present

## 2022-09-04 DIAGNOSIS — C7972 Secondary malignant neoplasm of left adrenal gland: Secondary | ICD-10-CM | POA: Diagnosis not present

## 2022-09-04 DIAGNOSIS — Z79620 Long term (current) use of immunosuppressive biologic: Secondary | ICD-10-CM | POA: Diagnosis not present

## 2022-09-04 DIAGNOSIS — E538 Deficiency of other specified B group vitamins: Secondary | ICD-10-CM | POA: Diagnosis not present

## 2022-09-04 DIAGNOSIS — R634 Abnormal weight loss: Secondary | ICD-10-CM | POA: Diagnosis not present

## 2022-09-04 DIAGNOSIS — Z923 Personal history of irradiation: Secondary | ICD-10-CM | POA: Diagnosis not present

## 2022-09-04 LAB — CBC WITH DIFFERENTIAL (CANCER CENTER ONLY)
Abs Immature Granulocytes: 0.03 10*3/uL (ref 0.00–0.07)
Basophils Absolute: 0.1 10*3/uL (ref 0.0–0.1)
Basophils Relative: 2 %
Eosinophils Absolute: 0.2 10*3/uL (ref 0.0–0.5)
Eosinophils Relative: 4 %
HCT: 32.8 % — ABNORMAL LOW (ref 36.0–46.0)
Hemoglobin: 11.2 g/dL — ABNORMAL LOW (ref 12.0–15.0)
Immature Granulocytes: 1 %
Lymphocytes Relative: 33 %
Lymphs Abs: 1.9 10*3/uL (ref 0.7–4.0)
MCH: 34.7 pg — ABNORMAL HIGH (ref 26.0–34.0)
MCHC: 34.1 g/dL (ref 30.0–36.0)
MCV: 101.5 fL — ABNORMAL HIGH (ref 80.0–100.0)
Monocytes Absolute: 1.1 10*3/uL — ABNORMAL HIGH (ref 0.1–1.0)
Monocytes Relative: 18 %
Neutro Abs: 2.5 10*3/uL (ref 1.7–7.7)
Neutrophils Relative %: 42 %
Platelet Count: 282 10*3/uL (ref 150–400)
RBC: 3.23 MIL/uL — ABNORMAL LOW (ref 3.87–5.11)
RDW: 15.7 % — ABNORMAL HIGH (ref 11.5–15.5)
Smear Review: NORMAL
WBC Count: 5.8 10*3/uL (ref 4.0–10.5)
nRBC: 0 % (ref 0.0–0.2)

## 2022-09-04 LAB — CMP (CANCER CENTER ONLY)
ALT: 15 U/L (ref 0–44)
AST: 23 U/L (ref 15–41)
Albumin: 3.6 g/dL (ref 3.5–5.0)
Alkaline Phosphatase: 71 U/L (ref 38–126)
Anion gap: 4 — ABNORMAL LOW (ref 5–15)
BUN: 12 mg/dL (ref 6–20)
CO2: 27 mmol/L (ref 22–32)
Calcium: 9.1 mg/dL (ref 8.9–10.3)
Chloride: 108 mmol/L (ref 98–111)
Creatinine: 0.89 mg/dL (ref 0.44–1.00)
GFR, Estimated: >60 mL/min (ref >60)
Glucose, Bld: 72 mg/dL (ref 70–99)
Potassium: 4.3 mmol/L (ref 3.5–5.1)
Sodium: 139 mmol/L (ref 135–145)
Total Bilirubin: 0.2 mg/dL — ABNORMAL LOW (ref 0.3–1.2)
Total Protein: 6.4 g/dL — ABNORMAL LOW (ref 6.5–8.1)

## 2022-09-04 LAB — TSH: TSH: 1.016 u[IU]/mL (ref 0.350–4.500)

## 2022-09-04 MED ORDER — SODIUM CHLORIDE 0.9% FLUSH
10.0000 mL | INTRAVENOUS | Status: DC | PRN
  Administered 2022-09-04: 10 mL

## 2022-09-04 MED ORDER — PROCHLORPERAZINE MALEATE 10 MG PO TABS
10.0000 mg | ORAL_TABLET | Freq: Once | ORAL | Status: AC
  Administered 2022-09-04: 10 mg via ORAL
  Filled 2022-09-04: qty 1

## 2022-09-04 MED ORDER — SODIUM CHLORIDE 0.9% FLUSH
10.0000 mL | Freq: Once | INTRAVENOUS | Status: AC
  Administered 2022-09-04: 10 mL

## 2022-09-04 MED ORDER — SODIUM CHLORIDE 0.9 % IV SOLN: Freq: Once | INTRAVENOUS | Status: AC

## 2022-09-04 MED ORDER — CYANOCOBALAMIN 1000 MCG/ML IJ SOLN
1000.0000 ug | Freq: Once | INTRAMUSCULAR | Status: AC
  Administered 2022-09-04: 1000 ug via INTRAMUSCULAR
  Filled 2022-09-04: qty 1

## 2022-09-04 MED ORDER — HEPARIN SOD (PORK) LOCK FLUSH 100 UNIT/ML IV SOLN
500.0000 [IU] | Freq: Once | INTRAVENOUS | Status: AC | PRN
  Administered 2022-09-04: 500 [IU]

## 2022-09-04 MED ORDER — SODIUM CHLORIDE 0.9 % IV SOLN
500.0000 mg/m2 | Freq: Once | INTRAVENOUS | Status: AC
  Administered 2022-09-04: 700 mg via INTRAVENOUS
  Filled 2022-09-04: qty 28

## 2022-09-04 MED ORDER — SODIUM CHLORIDE 0.9 % IV SOLN
200.0000 mg | Freq: Once | INTRAVENOUS | Status: AC
  Administered 2022-09-04: 200 mg via INTRAVENOUS
  Filled 2022-09-04: qty 200

## 2022-09-05 ENCOUNTER — Encounter: Payer: Self-pay | Admitting: Internal Medicine

## 2022-09-07 LAB — T4: T4, Total: 8.4 ug/dL (ref 4.5–12.0)

## 2022-09-24 ENCOUNTER — Ambulatory Visit (HOSPITAL_COMMUNITY)
Admission: RE | Admit: 2022-09-24 | Discharge: 2022-09-24 | Disposition: A | Discharge status: Home / Self Care | Payer: BC Managed Care – PPO | Source: Ambulatory Visit | Attending: Physician Assistant | Admitting: Physician Assistant

## 2022-09-24 DIAGNOSIS — C3491 Malignant neoplasm of unspecified part of right bronchus or lung: Secondary | ICD-10-CM

## 2022-09-24 DIAGNOSIS — C3411 Malignant neoplasm of upper lobe, right bronchus or lung: Secondary | ICD-10-CM | POA: Diagnosis not present

## 2022-09-24 MED ORDER — IOHEXOL 9 MG/ML PO SOLN
1000.0000 mL | ORAL | Status: AC
  Administered 2022-09-24: 1000 mL via ORAL

## 2022-09-24 MED ORDER — HEPARIN SOD (PORK) LOCK FLUSH 100 UNIT/ML IV SOLN
500.0000 [IU] | Freq: Once | INTRAVENOUS | Status: AC
  Administered 2022-09-24: 500 [IU] via INTRAVENOUS

## 2022-09-24 MED ORDER — IOHEXOL 300 MG/ML  SOLN
100.0000 mL | Freq: Once | INTRAMUSCULAR | Status: AC | PRN
  Administered 2022-09-24: 100 mL via INTRAVENOUS

## 2022-09-24 MED ORDER — HEPARIN SOD (PORK) LOCK FLUSH 100 UNIT/ML IV SOLN
INTRAVENOUS | Status: AC
  Filled 2022-09-24: qty 5

## 2022-09-24 MED ORDER — IOHEXOL 9 MG/ML PO SOLN
ORAL | Status: AC
  Filled 2022-09-24: qty 1000

## 2022-09-25 ENCOUNTER — Encounter: Payer: Self-pay | Admitting: Medical Oncology

## 2022-09-25 ENCOUNTER — Other Ambulatory Visit: Payer: Self-pay

## 2022-09-25 ENCOUNTER — Other Ambulatory Visit: Payer: BC Managed Care – PPO

## 2022-09-25 ENCOUNTER — Inpatient Hospital Stay: Payer: BC Managed Care – PPO

## 2022-09-25 ENCOUNTER — Encounter: Payer: Self-pay | Admitting: Internal Medicine

## 2022-09-25 ENCOUNTER — Inpatient Hospital Stay (HOSPITAL_BASED_OUTPATIENT_CLINIC_OR_DEPARTMENT_OTHER): Payer: BC Managed Care – PPO | Admitting: Internal Medicine

## 2022-09-25 ENCOUNTER — Inpatient Hospital Stay: Payer: BC Managed Care – PPO | Attending: Radiation Oncology

## 2022-09-25 VITALS — BP 105/71 | HR 89 | Temp 98.2°F | Resp 17 | Wt 112.9 lb

## 2022-09-25 DIAGNOSIS — C7971 Secondary malignant neoplasm of right adrenal gland: Secondary | ICD-10-CM | POA: Insufficient documentation

## 2022-09-25 DIAGNOSIS — Z8541 Personal history of malignant neoplasm of cervix uteri: Secondary | ICD-10-CM | POA: Diagnosis not present

## 2022-09-25 DIAGNOSIS — C7972 Secondary malignant neoplasm of left adrenal gland: Secondary | ICD-10-CM | POA: Insufficient documentation

## 2022-09-25 DIAGNOSIS — Z79899 Other long term (current) drug therapy: Secondary | ICD-10-CM | POA: Insufficient documentation

## 2022-09-25 DIAGNOSIS — Z923 Personal history of irradiation: Secondary | ICD-10-CM | POA: Insufficient documentation

## 2022-09-25 DIAGNOSIS — Z95828 Presence of other vascular implants and grafts: Secondary | ICD-10-CM

## 2022-09-25 DIAGNOSIS — Z9049 Acquired absence of other specified parts of digestive tract: Secondary | ICD-10-CM | POA: Diagnosis not present

## 2022-09-25 DIAGNOSIS — Z85820 Personal history of malignant melanoma of skin: Secondary | ICD-10-CM | POA: Diagnosis not present

## 2022-09-25 DIAGNOSIS — Z5112 Encounter for antineoplastic immunotherapy: Secondary | ICD-10-CM | POA: Diagnosis not present

## 2022-09-25 DIAGNOSIS — Z9851 Tubal ligation status: Secondary | ICD-10-CM | POA: Insufficient documentation

## 2022-09-25 DIAGNOSIS — Z79620 Long term (current) use of immunosuppressive biologic: Secondary | ICD-10-CM | POA: Insufficient documentation

## 2022-09-25 DIAGNOSIS — Z5111 Encounter for antineoplastic chemotherapy: Secondary | ICD-10-CM | POA: Diagnosis not present

## 2022-09-25 DIAGNOSIS — E538 Deficiency of other specified B group vitamins: Secondary | ICD-10-CM | POA: Insufficient documentation

## 2022-09-25 DIAGNOSIS — C3491 Malignant neoplasm of unspecified part of right bronchus or lung: Secondary | ICD-10-CM

## 2022-09-25 LAB — CBC WITH DIFFERENTIAL (CANCER CENTER ONLY)
Abs Immature Granulocytes: 0.04 10*3/uL (ref 0.00–0.07)
Basophils Absolute: 0.1 10*3/uL (ref 0.0–0.1)
Basophils Relative: 1 %
Eosinophils Absolute: 0.2 10*3/uL (ref 0.0–0.5)
Eosinophils Relative: 2 %
HCT: 32.8 % — ABNORMAL LOW (ref 36.0–46.0)
Hemoglobin: 11 g/dL — ABNORMAL LOW (ref 12.0–15.0)
Immature Granulocytes: 1 %
Lymphocytes Relative: 28 %
Lymphs Abs: 1.8 10*3/uL (ref 0.7–4.0)
MCH: 34.7 pg — ABNORMAL HIGH (ref 26.0–34.0)
MCHC: 33.5 g/dL (ref 30.0–36.0)
MCV: 103.5 fL — ABNORMAL HIGH (ref 80.0–100.0)
Monocytes Absolute: 1.2 10*3/uL — ABNORMAL HIGH (ref 0.1–1.0)
Monocytes Relative: 19 %
Neutro Abs: 3.1 10*3/uL (ref 1.7–7.7)
Neutrophils Relative %: 49 %
Platelet Count: 265 10*3/uL (ref 150–400)
RBC: 3.17 MIL/uL — ABNORMAL LOW (ref 3.87–5.11)
RDW: 15.2 % (ref 11.5–15.5)
Smear Review: NORMAL
WBC Count: 6.3 10*3/uL (ref 4.0–10.5)
nRBC: 0 % (ref 0.0–0.2)

## 2022-09-25 LAB — CMP (CANCER CENTER ONLY)
ALT: 14 U/L (ref 0–44)
AST: 26 U/L (ref 15–41)
Albumin: 3.5 g/dL (ref 3.5–5.0)
Alkaline Phosphatase: 68 U/L (ref 38–126)
Anion gap: 6 (ref 5–15)
BUN: 14 mg/dL (ref 6–20)
CO2: 26 mmol/L (ref 22–32)
Calcium: 8.8 mg/dL — ABNORMAL LOW (ref 8.9–10.3)
Chloride: 107 mmol/L (ref 98–111)
Creatinine: 0.75 mg/dL (ref 0.44–1.00)
GFR, Estimated: >60 mL/min (ref >60)
Glucose, Bld: 109 mg/dL — ABNORMAL HIGH (ref 70–99)
Potassium: 3.9 mmol/L (ref 3.5–5.1)
Sodium: 139 mmol/L (ref 135–145)
Total Bilirubin: 0.2 mg/dL — ABNORMAL LOW (ref 0.3–1.2)
Total Protein: 6.3 g/dL — ABNORMAL LOW (ref 6.5–8.1)

## 2022-09-25 LAB — TSH: TSH: 0.951 u[IU]/mL (ref 0.350–4.500)

## 2022-09-25 MED ORDER — SODIUM CHLORIDE 0.9 % IV SOLN
500.0000 mg/m2 | Freq: Once | INTRAVENOUS | Status: AC
  Administered 2022-09-25: 700 mg via INTRAVENOUS
  Filled 2022-09-25: qty 20

## 2022-09-25 MED ORDER — SODIUM CHLORIDE 0.9% FLUSH
10.0000 mL | INTRAVENOUS | Status: DC | PRN
  Administered 2022-09-25: 10 mL

## 2022-09-25 MED ORDER — PROCHLORPERAZINE MALEATE 10 MG PO TABS
10.0000 mg | ORAL_TABLET | Freq: Once | ORAL | Status: AC
  Administered 2022-09-25: 10 mg via ORAL
  Filled 2022-09-25: qty 1

## 2022-09-25 MED ORDER — HEPARIN SOD (PORK) LOCK FLUSH 100 UNIT/ML IV SOLN
500.0000 [IU] | Freq: Once | INTRAVENOUS | Status: AC | PRN
  Administered 2022-09-25: 500 [IU]

## 2022-09-25 MED ORDER — SODIUM CHLORIDE 0.9 % IV SOLN: Freq: Once | INTRAVENOUS | Status: AC

## 2022-09-25 MED ORDER — SODIUM CHLORIDE 0.9 % IV SOLN
200.0000 mg | Freq: Once | INTRAVENOUS | Status: AC
  Administered 2022-09-25: 200 mg via INTRAVENOUS
  Filled 2022-09-25: qty 200

## 2022-09-25 MED ORDER — SODIUM CHLORIDE 0.9% FLUSH
10.0000 mL | Freq: Once | INTRAVENOUS | Status: AC
  Administered 2022-09-25: 10 mL

## 2022-09-25 NOTE — Patient Instructions (Signed)
[facility]  Discharge Instructions: Thank you for choosing [facility] to provide your oncology and hematology care.   If you have a lab appointment with the Cancer Center, please go directly to the Cancer Center and check in at the registration area.   Wear comfortable clothing and clothing appropriate for easy access to any Portacath or PICC line.   We strive to give you quality time with your provider. You may need to reschedule your appointment if you arrive late (15 or more minutes).  Arriving late affects you and other patients whose appointments are after yours.  Also, if you miss three or more appointments without notifying the office, you may be dismissed from the clinic at the provider's discretion.      For prescription refill requests, have your pharmacy contact our office and allow 72 hours for refills to be completed.    Today you received the following chemotherapy and/or immunotherapy agents keytruda , alimta      To help prevent nausea and vomiting after your treatment, we encourage you to take your nausea medication as directed.  BELOW ARE SYMPTOMS THAT SHOULD BE REPORTED IMMEDIATELY: *FEVER GREATER THAN 100.4 F (38 C) OR HIGHER *CHILLS OR SWEATING *NAUSEA AND VOMITING THAT IS NOT CONTROLLED WITH YOUR NAUSEA MEDICATION *UNUSUAL SHORTNESS OF BREATH *UNUSUAL BRUISING OR BLEEDING *URINARY PROBLEMS (pain or burning when urinating, or frequent urination) *BOWEL PROBLEMS (unusual diarrhea, constipation, pain near the anus) TENDERNESS IN MOUTH AND THROAT WITH OR WITHOUT PRESENCE OF ULCERS (sore throat, sores in mouth, or a toothache) UNUSUAL RASH, SWELLING OR PAIN  UNUSUAL VAGINAL DISCHARGE OR ITCHING   Items with * indicate a potential emergency and should be followed up as soon as possible or go to the Emergency Department if any problems should occur.  Please show the CHEMOTHERAPY ALERT CARD or IMMUNOTHERAPY ALERT CARD  at check-in to the Emergency Department and triage nurse.  Should you have questions after your visit or need to cancel or reschedule your appointment, please contact [facility]  Dept: [phone]  and follow the prompts.  Office hours are 8:00 a.m. to 4:30 p.m. Monday - Friday. Please note that voicemails left after 4:00 p.m. may not be returned until the following business day.  We are closed weekends and major holidays. You have access to a nurse at all times for urgent questions. Please call the main number to the clinic Dept: [phone] and follow the prompts.   For any non-urgent questions, you may also contact your provider using MyChart. We now offer e-Visits for anyone 18 and older to request care online for non-urgent symptoms. For details visit [email or web address].   Also download the MyChart app! Go to the app store, search "MyChart", open the app, select [facility], and log in with your MyChart username and password.   

## 2022-09-25 NOTE — Progress Notes (Signed)
Patient seen by Dr. Mohamed  Vitals are within treatment parameters.  Labs reviewed: and are within treatment parameters.  Per physician team, patient is ready for treatment and there are NO modifications to the treatment plan.  

## 2022-09-25 NOTE — Progress Notes (Signed)
[facility] Telephone:([phone]) [phone]   Fax:([phone]) [phone]  OFFICE PROGRESS NOTE  [name], FNP [address] [place] [address]  DIAGNOSIS: Stage IV (T2b, N2, M1 C) non-small cell lung cancer, adenocarcinoma presented with right hilar mass in addition to mediastinal and upper abdominal lymphadenopathy in addition to bilateral adrenal metastases and metastatic disease in the musculature of the lower back.  This was diagnosed in April 2023.  DETECTED ALTERATION(S) / BIOMARKER(S) % CFDNA OR AMPLIFICATION ASSOCIATED FDA-APPROVED THERAPIES CLINICAL TRIAL AVAILABILITY [id number] 0.5% None  Yes [id number] (Splice Site Indel) 0.6% None  Yes  PD-L1 expression 0%  PRIOR THERAPY: Palliative radiotherapy to the right hilar region under the care of Dr. Mitzi Hansen.  CURRENT THERAPY:  systemic chemotherapy with carboplatin for AUC of 5, Alimta 500 Mg/M2 and Keytruda 200 Mg IV every 3 weeks.  First dose August 29, 2021.  Status post 18 cycles.  Starting from cycle #5 the patient is on maintenance treatment with Alimta and Keytruda every 3 weeks.  INTERVAL HISTORY: April Walsh 57 y.o. female is to the clinic today for follow-up visit accompanied by her sister.  The patient is feeling fine today with no concerning complaints.  She has been tolerating her treatment with maintenance Alimta and Keytruda fairly well.  She denied having any chest pain, shortness of breath, cough or hemoptysis.  She has no nausea, vomiting, diarrhea or constipation.  She has no headache or visual changes.  She is here today for evaluation with repeat CT scan of the chest, abdomen and pelvis for restaging of her disease.   MEDICAL HISTORY: Past Medical History:  Diagnosis Date   Anemia    Asthma    Bronchitis    Cancer (HCC)    basal cell melanoma, cervical cancer   COPD (chronic obstructive pulmonary disease) (HCC)    "early stages of COPD"   Headache    Lung cancer (HCC)  08/13/2021    ALLERGIES:  has No Known Allergies.  MEDICATIONS:  Current Outpatient Medications  Medication Sig Dispense Refill   apixaban (ELIQUIS) 2.5 MG TABS tablet Take 1 tablet (2.5 mg total) by mouth 2 (two) times daily. 60 tablet 0   atorvastatin (LIPITOR) 80 MG tablet TAKE 1 TABLET BY MOUTH EVERY DAY 30 tablet 3   Budeson-Glycopyrrol-Formoterol (BREZTRI AEROSPHERE) 160-9-4.8 MCG/ACT AERO Inhale 2 puffs into the lungs in the morning and at bedtime. 10.7 g 11   folic acid (FOLVITE) 1 MG tablet TAKE 1 TABLET BY MOUTH EVERY DAY 30 tablet 2   lidocaine-prilocaine (EMLA) cream Apply 1 Application topically as needed. 30 g 2   prochlorperazine (COMPAZINE) 10 MG tablet Take 1 tablet (10 mg total) by mouth every 6 (six) hours as needed. 30 tablet 2   No current facility-administered medications for this visit.   Facility-Administered Medications Ordered in Other Visits  Medication Dose Route Frequency Provider Last Rate Last Admin   cyanocobalamin (VITAMIN B12) 1000 MCG/ML injection            prochlorperazine (COMPAZINE) 10 MG tablet             SURGICAL HISTORY:  Past Surgical History:  Procedure Laterality Date   APPENDECTOMY     1996   BREAST BIOPSY Right    2010-2015   BRONCHIAL BIOPSY  08/13/2021   Procedure: BRONCHIAL BIOPSIES;  Surgeon: Leslye Peer, MD;  Location: [facility] ENDOSCOPY;  Service: Pulmonary;;   BRONCHIAL BRUSHINGS  08/13/2021   Procedure: BRONCHIAL BRUSHINGS;  Surgeon: Leslye Peer, MD;  Location[facility] ENDOSCOPY;  Service: Pulmonary;;   BRONCHIAL NEEDLE ASPIRATION BIOPSY  08/13/2021   Procedure: BRONCHIAL NEEDLE ASPIRATION BIOPSIES;  Surgeon: Leslye Peer, MD;  Location: [facility] ENDOSCOPY;  Service: Pulmonary;;   CESAREAN SECTION     1984, 1987   FOOT SURGERY Right    2 pins and screw 1999   FOOT SURGERY Left 06/21/2021   screw and 2 pins   HEMOSTASIS CONTROL  08/13/2021   Procedure: HEMOSTASIS CONTROL;  Surgeon: Leslye Peer, MD;  Location: [facility] ENDOSCOPY;   Service: Pulmonary;;   IR IMAGING GUIDED PORT INSERTION  11/26/2021   MELANOMA EXCISION Left    basal cell melanoma 2011   PARTIAL HYSTERECTOMY     1994   TUBAL LIGATION     1987   VIDEO BRONCHOSCOPY  08/13/2021   Procedure: VIDEO BRONCHOSCOPY WITHOUT FLUORO;  Surgeon: Leslye Peer, MD;  Location: [facility] ENDOSCOPY;  Service: Pulmonary;;   VIDEO BRONCHOSCOPY WITH ENDOBRONCHIAL ULTRASOUND N/A 08/13/2021   Procedure: VIDEO BRONCHOSCOPY WITH ENDOBRONCHIAL ULTRASOUND;  Surgeon: Leslye Peer, MD;  Location: [facility] ENDOSCOPY;  Service: Pulmonary;  Laterality: N/A;    REVIEW OF SYSTEMS:  Constitutional: negative Eyes: negative Ears, nose, mouth, throat, and face: negative Respiratory: negative Cardiovascular: negative Gastrointestinal: negative Genitourinary:negative Integument/breast: negative Hematologic/lymphatic: negative Musculoskeletal:negative Neurological: negative Behavioral/Psych: negative Endocrine: negative Allergic/Immunologic: negative   PHYSICAL EXAMINATION: General appearance: alert, cooperative, and no distress Head: Normocephalic, without obvious abnormality, atraumatic Neck: no adenopathy, no JVD, supple, symmetrical, trachea midline, and thyroid not enlarged, symmetric, no tenderness/mass/nodules Lymph nodes: Cervical, supraclavicular, and axillary nodes normal. Resp: clear to auscultation bilaterally Back: symmetric, no curvature. ROM normal. No CVA tenderness. Cardio: regular rate and rhythm, S1, S2 normal, no murmur, click, rub or gallop GI: soft, non-tender; bowel sounds normal; no masses,  no organomegaly Extremities: extremities normal, atraumatic, no cyanosis or edema Neurologic: Alert and oriented X 3, normal strength and tone. Normal symmetric reflexes. Normal coordination and gait  ECOG PERFORMANCE STATUS: 1 - Symptomatic but completely ambulatory  Blood pressure 105/71, pulse 89, temperature 98.2 F (36.8 C), temperature source Oral, resp. rate 17, weight  112 lb 14.4 oz (51.2 kg), SpO2 98 %.  LABORATORY DATA: Lab Results  Component Value Date   WBC 6.3 09/25/2022   HGB 11.0 (L) 09/25/2022   HCT 32.8 (L) 09/25/2022   MCV 103.5 (H) 09/25/2022   PLT 265 09/25/2022      Chemistry      Component Value Date/Time   NA 139 09/25/2022 0920   K 3.9 09/25/2022 0920   CL 107 09/25/2022 0920   CO2 26 09/25/2022 0920   BUN 14 09/25/2022 0920   CREATININE 0.75 09/25/2022 0920      Component Value Date/Time   CALCIUM 8.8 (L) 09/25/2022 0920   ALKPHOS 68 09/25/2022 0920   AST 26 09/25/2022 0920   ALT 14 09/25/2022 0920   BILITOT 0.2 (L) 09/25/2022 0920       RADIOGRAPHIC STUDIES: No results found.  ASSESSMENT AND PLAN: This is a very pleasant 57 years old white female with Stage IV (T2b, N2, M1 C) non-small cell lung cancer, adenocarcinoma presented with right hilar mass in addition to mediastinal and upper abdominal lymphadenopathy in addition to bilateral adrenal metastases and metastatic disease in the musculature of the lower back.  This was diagnosed in April 2023. The molecular studies by [employer] showed no actionable mutations and the patient has negative PD-L1 expression. She is currently undergoing palliative  radiotherapy to the right hilar region under the care of Dr. Mitzi Hansen. The patient is currently on systemic chemotherapy started with carboplatin for AUC of 5, Alimta 500 Mg/M2 and Keytruda 200 Mg IV every 3 weeks and starting from cycle #5 she is on maintenance treatment with Alimta and Keytruda every 3 weeks.  Status post a total of 18 cycles. The patient has been tolerating her treatment well with no concerning adverse effects. She had repeat CT scan of the chest, abdomen and pelvis performed yesterday.  Unfortunately the final report is still pending but I personally and independently reviewed the scan images in comparison to the previous 1 and I do not see any clear evidence for disease progression but I will wait for the  final report for confirmation. I recommended for her to proceed with cycle #19 of her treatment today as planned. She will come back for follow-up visit in 3 weeks for evaluation before the next cycle of her treatment. The patient was advised to call immediately if she has any other concerning symptoms in the interval. The patient voices understanding of current disease status and treatment options and is in agreement with the current care plan.  All questions were answered. The patient knows to call the clinic with any problems, questions or concerns. We can certainly see the patient much sooner if necessary.  The total time spent in the appointment was 30 minutes.  Disclaimer: This note was dictated with voice recognition software. Similar sounding words can inadvertently be transcribed and may not be corrected upon review.

## 2022-10-11 NOTE — Progress Notes (Unsigned)
[facility] OFFICE PROGRESS NOTE  [name], FNP [address] [place] [address]  DIAGNOSIS: Stage IV (T2b, N2, M1 C) non-small cell lung cancer, adenocarcinoma presented with right hilar mass in addition to mediastinal and upper abdominal lymphadenopathy in addition to bilateral adrenal metastases and metastatic disease in the musculature of the lower back.  This was diagnosed in April 2023.   DETECTED ALTERATION(S) / BIOMARKER(S)     % CFDNA OR AMPLIFICATION        ASSOCIATED FDA-APPROVED THERAPIES         CLINICAL TRIAL AVAILABILITY [id number] 0.5% None     Yes [id number] (Splice Site Indel) 0.6% None     Yes   PD-L1 expression 0%    PRIOR THERAPY: Palliative radiotherapy to the right hilar region under the care of Dr. Mitzi Hansen   CURRENT THERAPY: Systemic chemotherapy with carboplatin for AUC of 5, Alimta 500 Mg/M2 and Keytruda 200 Mg IV every 3 weeks.  First dose August 29, 2021. Status post 17 cycles.  Starting from cycle #5, the patient started maintenance Alimta and Keytruda.      INTERVAL HISTORY: April Walsh 57 y.o. female returns to the clinic today for a follow-up visit. The patient is feeling fairly well today without any concerning complaints except for fatigue***. She typically feels better after receiving her B12 injection every 9 weeks which is scheduled to be administered ***. The patient is currently undergoing chemotherapy and immunotherapy.  She denies any fever, chills, or night sweats. She *** a few pounds. Denies changes in her baseline dyspnea on exertion and states overall her breathing is "***". Denies significant cough mild intermittent cough due to pollen. She denies any chest pain or hemoptysis. Denies any recent nausea, vomiting, diarrhea, or constipation.  Denies any headache or visual changes.  She denies any rashes or skin changes. She is here today for evaluation and repeat blood work before starting cycle #20       MEDICAL HISTORY: Past Medical History:  Diagnosis Date   Anemia    Asthma    Bronchitis    Cancer (HCC)    basal cell melanoma, cervical cancer   COPD (chronic obstructive pulmonary disease) (HCC)    "early stages of COPD"   Headache    Lung cancer (HCC) 08/13/2021    ALLERGIES:  has No Known Allergies.  MEDICATIONS:  Current Outpatient Medications  Medication Sig Dispense Refill   apixaban (ELIQUIS) 2.5 MG TABS tablet Take 1 tablet (2.5 mg total) by mouth 2 (two) times daily. 60 tablet 0   atorvastatin (LIPITOR) 80 MG tablet TAKE 1 TABLET BY MOUTH EVERY DAY 30 tablet 3   Budeson-Glycopyrrol-Formoterol (BREZTRI AEROSPHERE) 160-9-4.8 MCG/ACT AERO Inhale 2 puffs into the lungs in the morning and at bedtime. 10.7 g 11   folic acid (FOLVITE) 1 MG tablet TAKE 1 TABLET BY MOUTH EVERY DAY 30 tablet 2   lidocaine-prilocaine (EMLA) cream Apply 1 Application topically as needed. 30 g 2   prochlorperazine (COMPAZINE) 10 MG tablet Take 1 tablet (10 mg total) by mouth every 6 (six) hours as needed. 30 tablet 2   No current facility-administered medications for this visit.   Facility-Administered Medications Ordered in Other Visits  Medication Dose Route Frequency Provider Last Rate Last Admin   cyanocobalamin (VITAMIN B12) 1000 MCG/ML injection            prochlorperazine (COMPAZINE) 10 MG tablet             SURGICAL  HISTORY:  Past Surgical History:  Procedure Laterality Date   APPENDECTOMY     1996   BREAST BIOPSY Right    2010-2015   BRONCHIAL BIOPSY  08/13/2021   Procedure: BRONCHIAL BIOPSIES;  Surgeon: Leslye Peer, MD;  Location: [facility] ENDOSCOPY;  Service: Pulmonary;;   BRONCHIAL BRUSHINGS  08/13/2021   Procedure: BRONCHIAL BRUSHINGS;  Surgeon: Leslye Peer, MD;  Location: [facility] ENDOSCOPY;  Service: Pulmonary;;   BRONCHIAL NEEDLE ASPIRATION BIOPSY  08/13/2021   Procedure: BRONCHIAL NEEDLE ASPIRATION BIOPSIES;  Surgeon: Leslye Peer, MD;  Location: [facility] ENDOSCOPY;  Service:  Pulmonary;;   CESAREAN SECTION     1984, 1987   FOOT SURGERY Right    2 pins and screw 1999   FOOT SURGERY Left 06/21/2021   screw and 2 pins   HEMOSTASIS CONTROL  08/13/2021   Procedure: HEMOSTASIS CONTROL;  Surgeon: Leslye Peer, MD;  Location: [facility] ENDOSCOPY;  Service: Pulmonary;;   IR IMAGING GUIDED PORT INSERTION  11/26/2021   MELANOMA EXCISION Left    basal cell melanoma 2011   PARTIAL HYSTERECTOMY     1994   TUBAL LIGATION     1987   VIDEO BRONCHOSCOPY  08/13/2021   Procedure: VIDEO BRONCHOSCOPY WITHOUT FLUORO;  Surgeon: Leslye Peer, MD;  Location: [facility] ENDOSCOPY;  Service: Pulmonary;;   VIDEO BRONCHOSCOPY WITH ENDOBRONCHIAL ULTRASOUND N/A 08/13/2021   Procedure: VIDEO BRONCHOSCOPY WITH ENDOBRONCHIAL ULTRASOUND;  Surgeon: Leslye Peer, MD;  Location: [facility] ENDOSCOPY;  Service: Pulmonary;  Laterality: N/A;    REVIEW OF SYSTEMS:   Review of Systems  Constitutional: Negative for appetite change, chills, fatigue, fever and unexpected weight change.  HENT:   Negative for mouth sores, nosebleeds, sore throat and trouble swallowing.   Eyes: Negative for eye problems and icterus.  Respiratory: Negative for cough, hemoptysis, shortness of breath and wheezing.   Cardiovascular: Negative for chest pain and leg swelling.  Gastrointestinal: Negative for abdominal pain, constipation, diarrhea, nausea and vomiting.  Genitourinary: Negative for bladder incontinence, difficulty urinating, dysuria, frequency and hematuria.   Musculoskeletal: Negative for back pain, gait problem, neck pain and neck stiffness.  Skin: Negative for itching and rash.  Neurological: Negative for dizziness, extremity weakness, gait problem, headaches, light-headedness and seizures.  Hematological: Negative for adenopathy. Does not bruise/bleed easily.  Psychiatric/Behavioral: Negative for confusion, depression and sleep disturbance. The patient is not nervous/anxious.     PHYSICAL EXAMINATION:  There were no vitals  taken for this visit.  ECOG PERFORMANCE STATUS: {CHL ONC ECOG [id number]  Physical Exam  Constitutional: Oriented to person, place, and time and well-developed, well-nourished, and in no distress. No distress.  HENT:  Head: Normocephalic and atraumatic.  Mouth/Throat: Oropharynx is clear and moist. No oropharyngeal exudate.  Eyes: Conjunctivae are normal. Right eye exhibits no discharge. Left eye exhibits no discharge. No scleral icterus.  Neck: Normal range of motion. Neck supple.  Cardiovascular: Normal rate, regular rhythm, normal heart sounds and intact distal pulses.   Pulmonary/Chest: Effort normal and breath sounds normal. No respiratory distress. No wheezes. No rales.  Abdominal: Soft. Bowel sounds are normal. Exhibits no distension and no mass. There is no tenderness.  Musculoskeletal: Normal range of motion. Exhibits no edema.  Lymphadenopathy:    No cervical adenopathy.  Neurological: Alert and oriented to person, place, and time. Exhibits normal muscle tone. Gait normal. Coordination normal.  Skin: Skin is warm and dry. No rash noted. Not diaphoretic. No erythema. No pallor.  Psychiatric: Mood, memory and judgment normal.  Vitals reviewed.  LABORATORY DATA: Lab Results  Component Value Date   WBC 6.3 09/25/2022   HGB 11.0 (L) 09/25/2022   HCT 32.8 (L) 09/25/2022   MCV 103.5 (H) 09/25/2022   PLT 265 09/25/2022      Chemistry      Component Value Date/Time   NA 139 09/25/2022 0920   K 3.9 09/25/2022 0920   CL 107 09/25/2022 0920   CO2 26 09/25/2022 0920   BUN 14 09/25/2022 0920   CREATININE 0.75 09/25/2022 0920      Component Value Date/Time   CALCIUM 8.8 (L) 09/25/2022 0920   ALKPHOS 68 09/25/2022 0920   AST 26 09/25/2022 0920   ALT 14 09/25/2022 0920   BILITOT 0.2 (L) 09/25/2022 0920       RADIOGRAPHIC STUDIES:  CT CHEST ABDOMEN PELVIS W CONTRAST  Result Date: 09/25/2022 CLINICAL DATA:  Metastatic disease evaluation. Adenocarcinoma of the right  lung, stage IV. * Tracking Code: BO * EXAM: CT CHEST, ABDOMEN, AND PELVIS WITH CONTRAST TECHNIQUE: Multidetector CT imaging of the chest, abdomen and pelvis was performed following the standard protocol during bolus administration of intravenous contrast. RADIATION DOSE REDUCTION: This exam was performed according to the departmental dose-optimization program which includes automated exposure control, adjustment of the mA and/or kV according to patient size and/or use of iterative reconstruction technique. CONTRAST:  OMNIPAQUE IOHEXOL 300 MG/ML  SOLN COMPARISON:  07/22/2022. FINDINGS: CT CHEST FINDINGS Cardiovascular: Heart size is normal. No pericardial effusion. Aortic atherosclerosis. Coronary artery calcifications. Mediastinum/Nodes: Thyroid gland, trachea appear normal. Mild diffuse circumferential wall thickening of the esophagus noted. No enlarged axillary, supraclavicular, mediastinal, or hilar lymph nodes. Lungs/Pleura: Increased volume of right pleural effusion. Again seen is thickening of the peribronchovascular interstitium, fibrosis, and architectural distortion within the right upper lobe and anterior left upper lobe, right middle lobe, and perihilar right lower lobe, no suspicious pulmonary nodule or mass identified. Which likely reflects changes secondary to external beam radiation. Musculoskeletal: No chest wall mass or suspicious bone lesions identified. CT ABDOMEN PELVIS FINDINGS Hepatobiliary: No suspicious liver lesions identified. The previously noted hyperdense lesion within the periphery of the right lobe of liver is not seen on today's study. This may represented a benign perfusion anomaly. Gallbladder appears within normal limits. No bile duct dilatation. Pancreas: Unremarkable. No pancreatic ductal dilatation or surrounding inflammatory changes. Spleen: Normal in size without focal abnormality. Adrenals/Urinary Tract: Normal adrenal glands. Simple cyst off the inferior pole of the  left kidney measures 3.1 cm. No follow-up imaging recommended. No signs of hydronephrosis. Bladder is unremarkable. Stomach/Bowel: Stomach is within normal limits. No dilated loops of large or small bowel. No bowel wall thickening or inflammation. Vascular/Lymphatic: Aortic atherosclerosis. No enlarged abdominal or pelvic lymph nodes. Reproductive: Status post hysterectomy. No adnexal masses. Other: No free fluid or fluid collections. No signs of pneumoperitoneum. Musculoskeletal: No suspicious bone lesions. L5-S1 degenerative disc disease. IMPRESSION: 1. No specific findings identified to suggest residual or recurrent tumor or metastatic disease within the chest, abdomen or pelvis. 2. Increased volume of right pleural effusion. 3. Similar appearance of post treatment changes within the right upper lobe, right middle lobe, and perihilar right lower lobe. 4. Coronary artery calcifications. 5. The previously noted hyperdense lesion within the periphery of the right lobe of liver is not seen on today's study. This is favored to have represented a benign perfusion anomaly. 6. Mild diffuse circumferential wall thickening of the esophagus. Correlate for any clinical signs or symptoms of esophagitis. 7.  Aortic Atherosclerosis (ICD10-I70.0). Electronically Signed   By: Signa Kell M.D.   On: 09/25/2022 11:45     ASSESSMENT/PLAN:  This is a very pleasant 57 years old white female with Stage IV (T2b, N2, M1 C) non-small cell lung cancer, adenocarcinoma presented with right hilar mass in addition to mediastinal and upper abdominal lymphadenopathy in addition to bilateral adrenal metastases and metastatic disease in the musculature of the lower back.  This was diagnosed in April 2023.   The molecular studies by [employer] showed no actionable mutations and the patient has negative PD-L1 expression. She completed palliative radiotherapy to the right hilar region under the care of Dr. Mitzi Hansen.   She completed  palliative radiation to the right hilar area under the care of Dr. Mitzi Hansen.   She is currently undergoing palliative systemic chemotherapy with carboplatin for an AUC of 5, Alimta 500 mg per metered squared, Keytruda 200 mg IV every 3 weeks.  She is status post 19 cycles.  Her first dose was on 08/29/2021. Starting from cycle #5, she started maintenance alimta and [place].   Labs were reviewed.  Her ANC is pending. As long as her ANC is >***, recommend that she *** with cycle #20 today as schedule.   Will see her back in 3 weeks for evaluation before starting cycle #21.   The patient was advised to call immediately if she has any concerning symptoms in the interval. The patient voices understanding of current disease status and treatment options and is in agreement with the current care plan. All questions were answered. The patient knows to call the clinic with any problems, questions or concerns. We can certainly see the patient much sooner if necessary      No orders of the defined types were placed in this encounter.    I spent {CHL ONC TIME VISIT - ZOXWR:[id number]} counseling the patient face to face. The total time spent in the appointment was {CHL ONC TIME VISIT - BJYNW:[id number]}.  Eunice Oldaker L Dalyce Renne, PA-C 10/11/22

## 2022-10-15 ENCOUNTER — Other Ambulatory Visit: Payer: Self-pay | Admitting: Physician Assistant

## 2022-10-15 DIAGNOSIS — C3401 Malignant neoplasm of right main bronchus: Secondary | ICD-10-CM

## 2022-10-16 ENCOUNTER — Inpatient Hospital Stay: Payer: BC Managed Care – PPO | Attending: Radiation Oncology

## 2022-10-16 ENCOUNTER — Other Ambulatory Visit: Payer: BC Managed Care – PPO

## 2022-10-16 ENCOUNTER — Inpatient Hospital Stay: Payer: BC Managed Care – PPO

## 2022-10-16 ENCOUNTER — Inpatient Hospital Stay (HOSPITAL_BASED_OUTPATIENT_CLINIC_OR_DEPARTMENT_OTHER): Payer: BC Managed Care – PPO | Admitting: Physician Assistant

## 2022-10-16 VITALS — BP 116/81 | HR 87 | Temp 98.4°F | Resp 17 | Ht 61.52 in | Wt 115.8 lb

## 2022-10-16 VITALS — BP 106/74 | HR 87 | Resp 16

## 2022-10-16 DIAGNOSIS — Z9851 Tubal ligation status: Secondary | ICD-10-CM | POA: Insufficient documentation

## 2022-10-16 DIAGNOSIS — E538 Deficiency of other specified B group vitamins: Secondary | ICD-10-CM | POA: Insufficient documentation

## 2022-10-16 DIAGNOSIS — C3491 Malignant neoplasm of unspecified part of right bronchus or lung: Secondary | ICD-10-CM

## 2022-10-16 DIAGNOSIS — Z8541 Personal history of malignant neoplasm of cervix uteri: Secondary | ICD-10-CM | POA: Insufficient documentation

## 2022-10-16 DIAGNOSIS — Z85820 Personal history of malignant melanoma of skin: Secondary | ICD-10-CM | POA: Insufficient documentation

## 2022-10-16 DIAGNOSIS — Z923 Personal history of irradiation: Secondary | ICD-10-CM | POA: Insufficient documentation

## 2022-10-16 DIAGNOSIS — Z79620 Long term (current) use of immunosuppressive biologic: Secondary | ICD-10-CM | POA: Insufficient documentation

## 2022-10-16 DIAGNOSIS — Z5112 Encounter for antineoplastic immunotherapy: Secondary | ICD-10-CM | POA: Diagnosis not present

## 2022-10-16 DIAGNOSIS — C7971 Secondary malignant neoplasm of right adrenal gland: Secondary | ICD-10-CM | POA: Diagnosis not present

## 2022-10-16 DIAGNOSIS — Z5111 Encounter for antineoplastic chemotherapy: Secondary | ICD-10-CM | POA: Insufficient documentation

## 2022-10-16 DIAGNOSIS — C7972 Secondary malignant neoplasm of left adrenal gland: Secondary | ICD-10-CM | POA: Insufficient documentation

## 2022-10-16 DIAGNOSIS — Z95828 Presence of other vascular implants and grafts: Secondary | ICD-10-CM

## 2022-10-16 DIAGNOSIS — Z9049 Acquired absence of other specified parts of digestive tract: Secondary | ICD-10-CM | POA: Insufficient documentation

## 2022-10-16 DIAGNOSIS — R21 Rash and other nonspecific skin eruption: Secondary | ICD-10-CM

## 2022-10-16 DIAGNOSIS — C3401 Malignant neoplasm of right main bronchus: Secondary | ICD-10-CM

## 2022-10-16 LAB — CMP (CANCER CENTER ONLY)
ALT: 12 U/L (ref 0–44)
AST: 21 U/L (ref 15–41)
Albumin: 3.4 g/dL — ABNORMAL LOW (ref 3.5–5.0)
Alkaline Phosphatase: 72 U/L (ref 38–126)
Anion gap: 6 (ref 5–15)
BUN: 11 mg/dL (ref 6–20)
CO2: 25 mmol/L (ref 22–32)
Calcium: 9.1 mg/dL (ref 8.9–10.3)
Chloride: 108 mmol/L (ref 98–111)
Creatinine: 0.75 mg/dL (ref 0.44–1.00)
GFR, Estimated: >60 mL/min (ref >60)
Glucose, Bld: 79 mg/dL (ref 70–99)
Potassium: 4.1 mmol/L (ref 3.5–5.1)
Sodium: 139 mmol/L (ref 135–145)
Total Bilirubin: 0.2 mg/dL — ABNORMAL LOW (ref 0.3–1.2)
Total Protein: 6.3 g/dL — ABNORMAL LOW (ref 6.5–8.1)

## 2022-10-16 LAB — CBC WITH DIFFERENTIAL (CANCER CENTER ONLY)
Abs Immature Granulocytes: 0.03 10*3/uL (ref 0.00–0.07)
Basophils Absolute: 0.1 10*3/uL (ref 0.0–0.1)
Basophils Relative: 1 %
Eosinophils Absolute: 0.2 10*3/uL (ref 0.0–0.5)
Eosinophils Relative: 3 %
HCT: 33.2 % — ABNORMAL LOW (ref 36.0–46.0)
Hemoglobin: 11 g/dL — ABNORMAL LOW (ref 12.0–15.0)
Immature Granulocytes: 1 %
Lymphocytes Relative: 29 %
Lymphs Abs: 1.7 10*3/uL (ref 0.7–4.0)
MCH: 34.5 pg — ABNORMAL HIGH (ref 26.0–34.0)
MCHC: 33.1 g/dL (ref 30.0–36.0)
MCV: 104.1 fL — ABNORMAL HIGH (ref 80.0–100.0)
Monocytes Absolute: 1.1 10*3/uL — ABNORMAL HIGH (ref 0.1–1.0)
Monocytes Relative: 18 %
Neutro Abs: 2.9 10*3/uL (ref 1.7–7.7)
Neutrophils Relative %: 48 %
Platelet Count: 282 10*3/uL (ref 150–400)
RBC: 3.19 MIL/uL — ABNORMAL LOW (ref 3.87–5.11)
RDW: 15 % (ref 11.5–15.5)
WBC Count: 6.1 10*3/uL (ref 4.0–10.5)
nRBC: 0 % (ref 0.0–0.2)

## 2022-10-16 LAB — TSH: TSH: 1.387 u[IU]/mL (ref 0.350–4.500)

## 2022-10-16 MED ORDER — PROCHLORPERAZINE MALEATE 10 MG PO TABS
10.0000 mg | ORAL_TABLET | Freq: Once | ORAL | Status: AC
  Administered 2022-10-16: 10 mg via ORAL
  Filled 2022-10-16: qty 1

## 2022-10-16 MED ORDER — HEPARIN SOD (PORK) LOCK FLUSH 100 UNIT/ML IV SOLN
500.0000 [IU] | Freq: Once | INTRAVENOUS | Status: AC | PRN
  Administered 2022-10-16: 500 [IU]

## 2022-10-16 MED ORDER — SODIUM CHLORIDE 0.9% FLUSH
10.0000 mL | INTRAVENOUS | Status: DC | PRN
  Administered 2022-10-16: 10 mL

## 2022-10-16 MED ORDER — SODIUM CHLORIDE 0.9% FLUSH
10.0000 mL | Freq: Once | INTRAVENOUS | Status: AC
  Administered 2022-10-16: 10 mL

## 2022-10-16 MED ORDER — SODIUM CHLORIDE 0.9 % IV SOLN
500.0000 mg/m2 | Freq: Once | INTRAVENOUS | Status: AC
  Administered 2022-10-16: 700 mg via INTRAVENOUS
  Filled 2022-10-16: qty 20

## 2022-10-16 MED ORDER — SODIUM CHLORIDE 0.9 % IV SOLN: Freq: Once | INTRAVENOUS | Status: AC

## 2022-10-16 MED ORDER — SODIUM CHLORIDE 0.9 % IV SOLN
200.0000 mg | Freq: Once | INTRAVENOUS | Status: AC
  Administered 2022-10-16: 200 mg via INTRAVENOUS
  Filled 2022-10-16: qty 200

## 2022-10-16 NOTE — Patient Instructions (Signed)
[facility]  Discharge Instructions: Thank you for choosing [facility] to provide your oncology and hematology care.   If you have a lab appointment with the Cancer Center, please go directly to the Cancer Center and check in at the registration area.   Wear comfortable clothing and clothing appropriate for easy access to any Portacath or PICC line.   We strive to give you quality time with your provider. You may need to reschedule your appointment if you arrive late (15 or more minutes).  Arriving late affects you and other patients whose appointments are after yours.  Also, if you miss three or more appointments without notifying the office, you may be dismissed from the clinic at the provider's discretion.      For prescription refill requests, have your pharmacy contact our office and allow 72 hours for refills to be completed.    Today you received the following chemotherapy and/or immunotherapy agents keytruda , alimta      To help prevent nausea and vomiting after your treatment, we encourage you to take your nausea medication as directed.  BELOW ARE SYMPTOMS THAT SHOULD BE REPORTED IMMEDIATELY: *FEVER GREATER THAN 100.4 F (38 C) OR HIGHER *CHILLS OR SWEATING *NAUSEA AND VOMITING THAT IS NOT CONTROLLED WITH YOUR NAUSEA MEDICATION *UNUSUAL SHORTNESS OF BREATH *UNUSUAL BRUISING OR BLEEDING *URINARY PROBLEMS (pain or burning when urinating, or frequent urination) *BOWEL PROBLEMS (unusual diarrhea, constipation, pain near the anus) TENDERNESS IN MOUTH AND THROAT WITH OR WITHOUT PRESENCE OF ULCERS (sore throat, sores in mouth, or a toothache) UNUSUAL RASH, SWELLING OR PAIN  UNUSUAL VAGINAL DISCHARGE OR ITCHING   Items with * indicate a potential emergency and should be followed up as soon as possible or go to the Emergency Department if any problems should occur.  Please show the CHEMOTHERAPY ALERT CARD or IMMUNOTHERAPY ALERT CARD  at check-in to the Emergency Department and triage nurse.  Should you have questions after your visit or need to cancel or reschedule your appointment, please contact [facility]  Dept: [phone]  and follow the prompts.  Office hours are 8:00 a.m. to 4:30 p.m. Monday - Friday. Please note that voicemails left after 4:00 p.m. may not be returned until the following business day.  We are closed weekends and major holidays. You have access to a nurse at all times for urgent questions. Please call the main number to the clinic Dept: [phone] and follow the prompts.   For any non-urgent questions, you may also contact your provider using MyChart. We now offer e-Visits for anyone 18 and older to request care online for non-urgent symptoms. For details visit [email or web address].   Also download the MyChart app! Go to the app store, search "MyChart", open the app, select [place], and log in with your MyChart username and password.   

## 2022-10-25 ENCOUNTER — Other Ambulatory Visit: Payer: Self-pay | Admitting: Emergency Medicine

## 2022-11-06 ENCOUNTER — Other Ambulatory Visit: Payer: Self-pay

## 2022-11-06 ENCOUNTER — Inpatient Hospital Stay (HOSPITAL_BASED_OUTPATIENT_CLINIC_OR_DEPARTMENT_OTHER): Payer: BC Managed Care – PPO | Admitting: Internal Medicine

## 2022-11-06 ENCOUNTER — Inpatient Hospital Stay: Payer: BC Managed Care – PPO

## 2022-11-06 VITALS — BP 104/75 | HR 89

## 2022-11-06 VITALS — BP 110/70 | HR 93 | Temp 97.7°F | Resp 16 | Ht 61.0 in | Wt 114.9 lb

## 2022-11-06 DIAGNOSIS — C3490 Malignant neoplasm of unspecified part of unspecified bronchus or lung: Secondary | ICD-10-CM | POA: Diagnosis not present

## 2022-11-06 DIAGNOSIS — Z5111 Encounter for antineoplastic chemotherapy: Secondary | ICD-10-CM | POA: Diagnosis not present

## 2022-11-06 DIAGNOSIS — Z5112 Encounter for antineoplastic immunotherapy: Secondary | ICD-10-CM | POA: Diagnosis not present

## 2022-11-06 DIAGNOSIS — Z923 Personal history of irradiation: Secondary | ICD-10-CM | POA: Diagnosis not present

## 2022-11-06 DIAGNOSIS — C3491 Malignant neoplasm of unspecified part of right bronchus or lung: Secondary | ICD-10-CM

## 2022-11-06 DIAGNOSIS — Z95828 Presence of other vascular implants and grafts: Secondary | ICD-10-CM

## 2022-11-06 DIAGNOSIS — E538 Deficiency of other specified B group vitamins: Secondary | ICD-10-CM | POA: Diagnosis not present

## 2022-11-06 DIAGNOSIS — Z9851 Tubal ligation status: Secondary | ICD-10-CM | POA: Diagnosis not present

## 2022-11-06 DIAGNOSIS — Z85820 Personal history of malignant melanoma of skin: Secondary | ICD-10-CM | POA: Diagnosis not present

## 2022-11-06 DIAGNOSIS — Z79620 Long term (current) use of immunosuppressive biologic: Secondary | ICD-10-CM | POA: Diagnosis not present

## 2022-11-06 DIAGNOSIS — Z9049 Acquired absence of other specified parts of digestive tract: Secondary | ICD-10-CM | POA: Diagnosis not present

## 2022-11-06 DIAGNOSIS — C7971 Secondary malignant neoplasm of right adrenal gland: Secondary | ICD-10-CM | POA: Diagnosis not present

## 2022-11-06 DIAGNOSIS — C7972 Secondary malignant neoplasm of left adrenal gland: Secondary | ICD-10-CM | POA: Diagnosis not present

## 2022-11-06 DIAGNOSIS — Z8541 Personal history of malignant neoplasm of cervix uteri: Secondary | ICD-10-CM | POA: Diagnosis not present

## 2022-11-06 LAB — CMP (CANCER CENTER ONLY)
ALT: 13 U/L (ref 0–44)
AST: 22 U/L (ref 15–41)
Albumin: 3.4 g/dL — ABNORMAL LOW (ref 3.5–5.0)
Alkaline Phosphatase: 69 U/L (ref 38–126)
Anion gap: 6 (ref 5–15)
BUN: 16 mg/dL (ref 6–20)
CO2: 26 mmol/L (ref 22–32)
Calcium: 9.3 mg/dL (ref 8.9–10.3)
Chloride: 108 mmol/L (ref 98–111)
Creatinine: 0.61 mg/dL (ref 0.44–1.00)
GFR, Estimated: >60 mL/min (ref >60)
Glucose, Bld: 92 mg/dL (ref 70–99)
Potassium: 4 mmol/L (ref 3.5–5.1)
Sodium: 140 mmol/L (ref 135–145)
Total Bilirubin: 0.2 mg/dL — ABNORMAL LOW (ref 0.3–1.2)
Total Protein: 6.5 g/dL (ref 6.5–8.1)

## 2022-11-06 LAB — CBC WITH DIFFERENTIAL (CANCER CENTER ONLY)
Abs Immature Granulocytes: 0.03 10*3/uL (ref 0.00–0.07)
Basophils Absolute: 0.1 10*3/uL (ref 0.0–0.1)
Basophils Relative: 1 %
Eosinophils Absolute: 0.2 10*3/uL (ref 0.0–0.5)
Eosinophils Relative: 3 %
HCT: 32.8 % — ABNORMAL LOW (ref 36.0–46.0)
Hemoglobin: 11.2 g/dL — ABNORMAL LOW (ref 12.0–15.0)
Immature Granulocytes: 1 %
Lymphocytes Relative: 30 %
Lymphs Abs: 1.8 10*3/uL (ref 0.7–4.0)
MCH: 35.4 pg — ABNORMAL HIGH (ref 26.0–34.0)
MCHC: 34.1 g/dL (ref 30.0–36.0)
MCV: 103.8 fL — ABNORMAL HIGH (ref 80.0–100.0)
Monocytes Absolute: 1 10*3/uL (ref 0.1–1.0)
Monocytes Relative: 17 %
Neutro Abs: 2.8 10*3/uL (ref 1.7–7.7)
Neutrophils Relative %: 48 %
Platelet Count: 295 10*3/uL (ref 150–400)
RBC: 3.16 MIL/uL — ABNORMAL LOW (ref 3.87–5.11)
RDW: 14.9 % (ref 11.5–15.5)
Smear Review: NORMAL
WBC Count: 5.8 10*3/uL (ref 4.0–10.5)
nRBC: 0 % (ref 0.0–0.2)

## 2022-11-06 LAB — TSH: TSH: 0.781 u[IU]/mL (ref 0.350–4.500)

## 2022-11-06 MED ORDER — SODIUM CHLORIDE 0.9 % IV SOLN: Freq: Once | INTRAVENOUS | Status: AC

## 2022-11-06 MED ORDER — SODIUM CHLORIDE 0.9 % IV SOLN
500.0000 mg/m2 | Freq: Once | INTRAVENOUS | Status: AC
  Administered 2022-11-06: 700 mg via INTRAVENOUS
  Filled 2022-11-06: qty 20

## 2022-11-06 MED ORDER — SODIUM CHLORIDE 0.9 % IV SOLN
200.0000 mg | Freq: Once | INTRAVENOUS | Status: AC
  Administered 2022-11-06: 200 mg via INTRAVENOUS
  Filled 2022-11-06: qty 200

## 2022-11-06 MED ORDER — SODIUM CHLORIDE 0.9% FLUSH
10.0000 mL | Freq: Once | INTRAVENOUS | Status: AC
  Administered 2022-11-06: 10 mL

## 2022-11-06 MED ORDER — HEPARIN SOD (PORK) LOCK FLUSH 100 UNIT/ML IV SOLN: 500.0000 [IU] | Freq: Once | INTRAVENOUS | Status: DC | PRN

## 2022-11-06 MED ORDER — CYANOCOBALAMIN 1000 MCG/ML IJ SOLN
1000.0000 ug | Freq: Once | INTRAMUSCULAR | Status: AC
  Administered 2022-11-06: 1000 ug via INTRAMUSCULAR
  Filled 2022-11-06: qty 1

## 2022-11-06 MED ORDER — SODIUM CHLORIDE 0.9% FLUSH: 10.0000 mL | INTRAVENOUS | Status: DC | PRN

## 2022-11-06 MED ORDER — PROCHLORPERAZINE MALEATE 10 MG PO TABS
10.0000 mg | ORAL_TABLET | Freq: Once | ORAL | Status: AC
  Administered 2022-11-06: 10 mg via ORAL
  Filled 2022-11-06: qty 1

## 2022-11-06 NOTE — Patient Instructions (Signed)
[facility]  Discharge Instructions: Thank you for choosing [facility] to provide your oncology and hematology care.   If you have a lab appointment with the Cancer Center, please go directly to the Cancer Center and check in at the registration area.   Wear comfortable clothing and clothing appropriate for easy access to any Portacath or PICC line.   We strive to give you quality time with your provider. You may need to reschedule your appointment if you arrive late (15 or more minutes).  Arriving late affects you and other patients whose appointments are after yours.  Also, if you miss three or more appointments without notifying the office, you may be dismissed from the clinic at the provider's discretion.      For prescription refill requests, have your pharmacy contact our office and allow 72 hours for refills to be completed.    Today you received the following chemotherapy and/or immunotherapy agents: Keytruda, Alimta.       To help prevent nausea and vomiting after your treatment, we encourage you to take your nausea medication as directed.  BELOW ARE SYMPTOMS THAT SHOULD BE REPORTED IMMEDIATELY: *FEVER GREATER THAN 100.4 F (38 C) OR HIGHER *CHILLS OR SWEATING *NAUSEA AND VOMITING THAT IS NOT CONTROLLED WITH YOUR NAUSEA MEDICATION *UNUSUAL SHORTNESS OF BREATH *UNUSUAL BRUISING OR BLEEDING *URINARY PROBLEMS (pain or burning when urinating, or frequent urination) *BOWEL PROBLEMS (unusual diarrhea, constipation, pain near the anus) TENDERNESS IN MOUTH AND THROAT WITH OR WITHOUT PRESENCE OF ULCERS (sore throat, sores in mouth, or a toothache) UNUSUAL RASH, SWELLING OR PAIN  UNUSUAL VAGINAL DISCHARGE OR ITCHING   Items with * indicate a potential emergency and should be followed up as soon as possible or go to the Emergency Department if any problems should occur.  Please show the CHEMOTHERAPY ALERT CARD or IMMUNOTHERAPY ALERT CARD  at check-in to the Emergency Department and triage nurse.  Should you have questions after your visit or need to cancel or reschedule your appointment, please contact [facility]  Dept: [phone]  and follow the prompts.  Office hours are 8:00 a.m. to 4:30 p.m. Monday - Friday. Please note that voicemails left after 4:00 p.m. may not be returned until the following business day.  We are closed weekends and major holidays. You have access to a nurse at all times for urgent questions. Please call the main number to the clinic Dept: [phone] and follow the prompts.   For any non-urgent questions, you may also contact your provider using MyChart. We now offer e-Visits for anyone 18 and older to request care online for non-urgent symptoms. For details visit [email or web address].   Also download the MyChart app! Go to the app store, search "MyChart", open the app, select [place], and log in with your MyChart username and password.   

## 2022-11-06 NOTE — Progress Notes (Signed)
[facility] Telephone:([phone]   [phone]  OFFICE PROGRESS NOTE  [name], FNP [address] [place] [address]  DIAGNOSIS: Stage IV (T2b, N2, M1 C) non-small cell lung cancer, adenocarcinoma presented with right hilar mass in addition to mediastinal and upper abdominal lymphadenopathy in addition to bilateral adrenal metastases and metastatic disease in the musculature of the lower back.  This was diagnosed in April 2023.  DETECTED ALTERATION(S) / BIOMARKER(S) % CFDNA OR AMPLIFICATION ASSOCIATED FDA-APPROVED THERAPIES CLINICAL TRIAL AVAILABILITY [id number] 0.5% None  Yes [id number] (Splice Site Indel) 0.6% None  Yes  PD-L1 expression 0%  PRIOR THERAPY: Palliative radiotherapy to the right hilar region under the care of Dr. Mitzi Hansen.  CURRENT THERAPY:  systemic chemotherapy with carboplatin for AUC of 5, Alimta 500 Mg/M2 and Keytruda 200 Mg IV every 3 weeks.  First dose August 29, 2021.  Status post 20 cycles.  Starting from cycle #5 the patient is on maintenance treatment with Alimta and Keytruda every 3 weeks.  INTERVAL HISTORY: Brette Cast 57 y.o. female returns to the clinic today for follow-up visit accompanied by a family member.  The patient is feeling fine today with no concerning complaints.  She has no chest pain, shortness of breath, cough or hemoptysis.  She has no nausea, vomiting, diarrhea or constipation.  She has no headache or visual changes.  She denied having any recent weight loss or night sweats.  She has been tolerating her maintenance treatment fairly well.  The patient is here today for evaluation before starting cycle #20 one of her treatment.  MEDICAL HISTORY: Past Medical History:  Diagnosis Date   Anemia    Asthma    Bronchitis    Cancer (HCC)    basal cell melanoma, cervical cancer   COPD (chronic obstructive pulmonary disease) (HCC)    "early stages of COPD"   Headache    Lung cancer  (HCC) 08/13/2021    ALLERGIES:  has No Known Allergies.  MEDICATIONS:  Current Outpatient Medications  Medication Sig Dispense Refill   apixaban (ELIQUIS) 2.5 MG TABS tablet Take 1 tablet (2.5 mg total) by mouth 2 (two) times daily. 60 tablet 0   atorvastatin (LIPITOR) 80 MG tablet TAKE 1 TABLET BY MOUTH EVERY DAY 30 tablet 3   BREZTRI AEROSPHERE 160-9-4.8 MCG/ACT AERO INHALE 2 PUFFS INTO THE LUNGS IN THE MORNING AND AT BEDTIME. 10.7 each 11   folic acid (FOLVITE) 1 MG tablet TAKE 1 TABLET BY MOUTH EVERY DAY 30 tablet 2   lidocaine-prilocaine (EMLA) cream Apply 1 Application topically as needed. 30 g 2   prochlorperazine (COMPAZINE) 10 MG tablet Take 1 tablet (10 mg total) by mouth every 6 (six) hours as needed. 30 tablet 2   No current facility-administered medications for this visit.   Facility-Administered Medications Ordered in Other Visits  Medication Dose Route Frequency Provider Last Rate Last Admin   cyanocobalamin (VITAMIN B12) 1000 MCG/ML injection            prochlorperazine (COMPAZINE) 10 MG tablet             SURGICAL HISTORY:  Past Surgical History:  Procedure Laterality Date   APPENDECTOMY     1996   BREAST BIOPSY Right    2010-2015   BRONCHIAL BIOPSY  08/13/2021   Procedure: BRONCHIAL BIOPSIES;  Surgeon: Leslye Peer, MD;  Location[facility] ENDOSCOPY;  Service: Pulmonary;;   BRONCHIAL BRUSHINGS  08/13/2021   Procedure: BRONCHIAL BRUSHINGS;  Surgeon: Leslye Peer, MD;  Location: [facility] ENDOSCOPY;  Service: Pulmonary;;   BRONCHIAL NEEDLE ASPIRATION BIOPSY  08/13/2021   Procedure: BRONCHIAL NEEDLE ASPIRATION BIOPSIES;  Surgeon: Leslye Peer, MD;  Location: [facility] ENDOSCOPY;  Service: Pulmonary;;   CESAREAN SECTION     1984, 1987   FOOT SURGERY Right    2 pins and screw 1999   FOOT SURGERY Left 06/21/2021   screw and 2 pins   HEMOSTASIS CONTROL  08/13/2021   Procedure: HEMOSTASIS CONTROL;  Surgeon: Leslye Peer, MD;  Location: [facility] ENDOSCOPY;  Service: Pulmonary;;   IR  IMAGING GUIDED PORT INSERTION  11/26/2021   MELANOMA EXCISION Left    basal cell melanoma 2011   PARTIAL HYSTERECTOMY     1994   TUBAL LIGATION     1987   VIDEO BRONCHOSCOPY  08/13/2021   Procedure: VIDEO BRONCHOSCOPY WITHOUT FLUORO;  Surgeon: Leslye Peer, MD;  Location: [facility] ENDOSCOPY;  Service: Pulmonary;;   VIDEO BRONCHOSCOPY WITH ENDOBRONCHIAL ULTRASOUND N/A 08/13/2021   Procedure: VIDEO BRONCHOSCOPY WITH ENDOBRONCHIAL ULTRASOUND;  Surgeon: Leslye Peer, MD;  Location: [facility] ENDOSCOPY;  Service: Pulmonary;  Laterality: N/A;    REVIEW OF SYSTEMS:  A comprehensive review of systems was negative.   PHYSICAL EXAMINATION: General appearance: alert, cooperative, and no distress Head: Normocephalic, without obvious abnormality, atraumatic Neck: no adenopathy, no JVD, supple, symmetrical, trachea midline, and thyroid not enlarged, symmetric, no tenderness/mass/nodules Lymph nodes: Cervical, supraclavicular, and axillary nodes normal. Resp: clear to auscultation bilaterally Back: symmetric, no curvature. ROM normal. No CVA tenderness. Cardio: regular rate and rhythm, S1, S2 normal, no murmur, click, rub or gallop GI: soft, non-tender; bowel sounds normal; no masses,  no organomegaly Extremities: extremities normal, atraumatic, no cyanosis or edema  ECOG PERFORMANCE STATUS: 1 - Symptomatic but completely ambulatory  Blood pressure 110/70, pulse 93, temperature 97.7 F (36.5 C), temperature source Oral, resp. rate 16, height 5\' 1"  (1.549 m), weight 114 lb 14.4 oz (52.1 kg), SpO2 100 %.  LABORATORY DATA: Lab Results  Component Value Date   WBC 6.1 10/16/2022   HGB 11.0 (L) 10/16/2022   HCT 33.2 (L) 10/16/2022   MCV 104.1 (H) 10/16/2022   PLT 282 10/16/2022      Chemistry      Component Value Date/Time   NA 139 10/16/2022 0814   K 4.1 10/16/2022 0814   CL 108 10/16/2022 0814   CO2 25 10/16/2022 0814   BUN 11 10/16/2022 0814   CREATININE 0.75 10/16/2022 0814      Component Value  Date/Time   CALCIUM 9.1 10/16/2022 0814   ALKPHOS 72 10/16/2022 0814   AST 21 10/16/2022 0814   ALT 12 10/16/2022 0814   BILITOT 0.2 (L) 10/16/2022 0814       RADIOGRAPHIC STUDIES: No results found.  ASSESSMENT AND PLAN: This is a very pleasant 57 years old white female with Stage IV (T2b, N2, M1 C) non-small cell lung cancer, adenocarcinoma presented with right hilar mass in addition to mediastinal and upper abdominal lymphadenopathy in addition to bilateral adrenal metastases and metastatic disease in the musculature of the lower back.  This was diagnosed in April 2023. The molecular studies by [employer] showed no actionable mutations and the patient has negative PD-L1 expression. She is currently undergoing palliative radiotherapy to the right hilar region under the care of Dr. Mitzi Hansen. The patient is currently on systemic chemotherapy started with carboplatin for AUC of 5, Alimta 500 Mg/M2 and Keytruda 200 Mg IV every 3 weeks  and starting from cycle #5 she is on maintenance treatment with Alimta and Keytruda every 3 weeks.  Status post a total of 20 cycles. The patient has been tolerating her treatment fairly well with no concerning adverse effects. I recommended for her to proceed with cycle #21 today as planned. I will see her back for follow-up visit in 3 weeks for evaluation with repeat CT scan of the chest, abdomen and pelvis for restaging of her disease. She was advised to call immediately if she has any other concerning symptoms in the interval. The patient voices understanding of current disease status and treatment options and is in agreement with the current care plan. All questions were answered. The patient knows to call the clinic with any problems, questions or concerns. We can certainly see the patient much sooner if necessary.  The total time spent in the appointment was 20 minutes.  Disclaimer: This note was dictated with voice recognition software. Similar sounding  words can inadvertently be transcribed and may not be corrected upon review.

## 2022-11-07 LAB — T4: T4, Total: 7.4 ug/dL (ref 4.5–12.0)

## 2022-11-23 NOTE — Progress Notes (Signed)
[facility] OFFICE PROGRESS NOTE  [name], FNP [address] [place] [address]  DIAGNOSIS:  Stage IV (T2b, N2, M1 C) non-small cell lung cancer, adenocarcinoma presented with right hilar mass in addition to mediastinal and upper abdominal lymphadenopathy in addition to bilateral adrenal metastases and metastatic disease in the musculature of the lower back.  This was diagnosed in April 2023.   DETECTED ALTERATION(S) / BIOMARKER(S)     % CFDNA OR AMPLIFICATION        ASSOCIATED FDA-APPROVED THERAPIES         CLINICAL TRIAL AVAILABILITY [id number] 0.5% None     Yes [id number] (Splice Site Indel) 0.6% None     Yes   PD-L1 expression 0%  PRIOR THERAPY: Palliative radiotherapy to the right hilar region under the care of Dr. Mitzi Hansen   CURRENT THERAPY:  Systemic chemotherapy with carboplatin for AUC of 5, Alimta 500 Mg/M2 and Keytruda 200 Mg IV every 3 weeks.  First dose August 29, 2021. Status post 21 cycles.  Starting from cycle #5, the patient started maintenance Alimta and Keytruda.      INTERVAL HISTORY: April Walsh 57 y.o. female returns to the clinic today for a follow-up visit. The patient is feeling fairly well today without any concerning complaints. The patient is currently undergoing chemotherapy and immunotherapy.  She denies any fever, chills, or night sweats. Denies changes in her baseline dyspnea on exertion. Denies significant cough. She denies any chest pain or hemoptysis. Denies any recent nausea, vomiting, diarrhea, or constipation.  Denies any headache or visual changes.  She denies any rashes or skin changes. She recently had a restaging CT scan performed. She is here today for evaluation and to review her scan  before starting cycle #22.    MEDICAL HISTORY: Past Medical History:  Diagnosis Date   Anemia    Asthma    Bronchitis    Cancer (HCC)    basal cell melanoma, cervical cancer   COPD (chronic obstructive pulmonary  disease) (HCC)    "early stages of COPD"   Headache    Lung cancer (HCC) 08/13/2021    ALLERGIES:  has No Known Allergies.  MEDICATIONS:  Current Outpatient Medications  Medication Sig Dispense Refill   apixaban (ELIQUIS) 2.5 MG TABS tablet Take 1 tablet (2.5 mg total) by mouth 2 (two) times daily. 60 tablet 0   atorvastatin (LIPITOR) 80 MG tablet TAKE 1 TABLET BY MOUTH EVERY DAY 30 tablet 3   BREZTRI AEROSPHERE 160-9-4.8 MCG/ACT AERO INHALE 2 PUFFS INTO THE LUNGS IN THE MORNING AND AT BEDTIME. 10.7 each 11   folic acid (FOLVITE) 1 MG tablet TAKE 1 TABLET BY MOUTH EVERY DAY 30 tablet 2   lidocaine-prilocaine (EMLA) cream Apply 1 Application topically as needed. 30 g 2   prochlorperazine (COMPAZINE) 10 MG tablet Take 1 tablet (10 mg total) by mouth every 6 (six) hours as needed. 30 tablet 2   No current facility-administered medications for this visit.   Facility-Administered Medications Ordered in Other Visits  Medication Dose Route Frequency Provider Last Rate Last Admin   cyanocobalamin (VITAMIN B12) 1000 MCG/ML injection            prochlorperazine (COMPAZINE) 10 MG tablet             SURGICAL HISTORY:  Past Surgical History:  Procedure Laterality Date   APPENDECTOMY     1996   BREAST BIOPSY Right    2010-2015   BRONCHIAL BIOPSY  08/13/2021  Procedure: BRONCHIAL BIOPSIES;  Surgeon: Leslye Peer, MD;  Location: [facility] ENDOSCOPY;  Service: Pulmonary;;   BRONCHIAL BRUSHINGS  08/13/2021   Procedure: BRONCHIAL BRUSHINGS;  Surgeon: Leslye Peer, MD;  Location: [facility] ENDOSCOPY;  Service: Pulmonary;;   BRONCHIAL NEEDLE ASPIRATION BIOPSY  08/13/2021   Procedure: BRONCHIAL NEEDLE ASPIRATION BIOPSIES;  Surgeon: Leslye Peer, MD;  Location: [facility] ENDOSCOPY;  Service: Pulmonary;;   CESAREAN SECTION     1984, 1987   FOOT SURGERY Right    2 pins and screw 1999   FOOT SURGERY Left 06/21/2021   screw and 2 pins   HEMOSTASIS CONTROL  08/13/2021   Procedure: HEMOSTASIS CONTROL;  Surgeon: Leslye Peer, MD;  Location: [facility] ENDOSCOPY;  Service: Pulmonary;;   IR IMAGING GUIDED PORT INSERTION  11/26/2021   MELANOMA EXCISION Left    basal cell melanoma 2011   PARTIAL HYSTERECTOMY     1994   TUBAL LIGATION     1987   VIDEO BRONCHOSCOPY  08/13/2021   Procedure: VIDEO BRONCHOSCOPY WITHOUT FLUORO;  Surgeon: Leslye Peer, MD;  Location: [facility] ENDOSCOPY;  Service: Pulmonary;;   VIDEO BRONCHOSCOPY WITH ENDOBRONCHIAL ULTRASOUND N/A 08/13/2021   Procedure: VIDEO BRONCHOSCOPY WITH ENDOBRONCHIAL ULTRASOUND;  Surgeon: Leslye Peer, MD;  Location: [facility] ENDOSCOPY;  Service: Pulmonary;  Laterality: N/A;    REVIEW OF SYSTEMS:   Constitutional: Negative for appetite change, chills, fatigue, fever and unexpected weight change.  HENT:   Negative for mouth sores, nosebleeds, sore throat and trouble swallowing.   Eyes: Negative for eye problems and icterus.  Respiratory: Negative for cough (not constant), hemoptysis, shortness of breath and wheezing.   Cardiovascular: Negative for chest pain and leg swelling.  Gastrointestinal: Negative for abdominal pain, constipation, diarrhea, nausea and vomiting.  Genitourinary: Negative for bladder incontinence, difficulty urinating, dysuria, frequency and hematuria.   Musculoskeletal: Negative for back pain, gait problem, neck pain and neck stiffness.  Skin: Negative for itching and rash.  Neurological: Negative for dizziness, extremity weakness, gait problem, headaches, light-headedness and seizures.  Hematological: Negative for adenopathy. Does not bruise/bleed easily.  Psychiatric/Behavioral: Negative for confusion, depression and sleep disturbance. The patient is not nervous/anxious.     PHYSICAL EXAMINATION:  Blood pressure 110/79, pulse (!) 101, temperature 98.5 F (36.9 C), temperature source Oral, resp. rate 17, weight 112 lb (50.8 kg), SpO2 98%.  ECOG PERFORMANCE STATUS: 1  Physical Exam  Constitutional: Oriented to person, place, and time and positive  for thin appearing female and in no acute distress.   HENT:  Head: Normocephalic and atraumatic.  Mouth/Throat: Oropharynx is clear and moist. No oropharyngeal exudate.  Eyes: Conjunctivae are normal. Right eye exhibits no discharge. Left eye exhibits no discharge. No scleral icterus.  Neck: Normal range of motion. Neck supple.  Cardiovascular: Normal rate, regular rhythm, normal heart sounds and intact distal pulses.   Pulmonary/Chest: Effort normal and breath sounds normal. No respiratory distress. No wheezes. No rales.  Abdominal: Soft. Bowel sounds are normal. Exhibits no distension and no mass. There is no tenderness.  Musculoskeletal: Normal range of motion. Exhibits no edema.  Lymphadenopathy:    No cervical adenopathy.  Neurological: Alert and oriented to person, place, and time. Exhibits normal muscle tone. Gait normal. Coordination normal.  Skin: Not diaphoretic. No erythema. No pallor.  Psychiatric: Mood, memory and judgment normal.  Vitals reviewed.  LABORATORY DATA: Lab Results  Component Value Date   WBC 6.4 11/27/2022   HGB 11.7 (L) 11/27/2022   HCT 34.2 (L) 11/27/2022  MCV 102.7 (H) 11/27/2022   PLT 318 11/27/2022      Chemistry      Component Value Date/Time   NA 140 11/06/2022 1059   K 4.0 11/06/2022 1059   CL 108 11/06/2022 1059   CO2 26 11/06/2022 1059   BUN 16 11/06/2022 1059   CREATININE 0.61 11/06/2022 1059      Component Value Date/Time   CALCIUM 9.3 11/06/2022 1059   ALKPHOS 69 11/06/2022 1059   AST 22 11/06/2022 1059   ALT 13 11/06/2022 1059   BILITOT 0.2 (L) 11/06/2022 1059       RADIOGRAPHIC STUDIES:  No results found.   ASSESSMENT/PLAN:  This is a very pleasant 57 years old white female with Stage IV (T2b, N2, M1 C) non-small cell lung cancer, adenocarcinoma presented with right hilar mass in addition to mediastinal and upper abdominal lymphadenopathy in addition to bilateral adrenal metastases and metastatic disease in the  musculature of the lower back.  This was diagnosed in April 2023.   The molecular studies by [employer] showed no actionable mutations and the patient has negative PD-L1 expression. She completed palliative radiotherapy to the right hilar region under the care of Dr. Mitzi Hansen.   She completed palliative radiation to the right hilar area under the care of Dr. Mitzi Hansen.   She is currently undergoing palliative systemic chemotherapy with carboplatin for an AUC of 5, Alimta 500 mg per metered squared, Keytruda 200 mg IV every 3 weeks.  She is status post 21 cycles.  Her first dose was on 08/29/2021. Starting from cycle #5, she started maintenance alimta and [place].  The patient was seen with Dr. Arbutus Ped today.  Dr. Arbutus Ped personally and independently reviewed the scan and discussed results with the patient today.  The final pathology report is not available at this time.  Dr. Arbutus Ped do not see any obvious signs of disease progression but we will wait for the final radiology over read.  I will call the patient with the results later.     Labs were reviewed.  Recommend that she proceed with cycle #22 today as schedule.   We will see her back in 3 weeks for evaluation before starting cycle #23  The patient was advised to call immediately if she has any concerning symptoms in the interval. The patient voices understanding of current disease status and treatment options and is in agreement with the current care plan. All questions were answered. The patient knows to call the clinic with any problems, questions or concerns. We can certainly see the patient much sooner if necessary No orders of the defined types were placed in this encounter.    Demaurion Dicioccio L Leili Eskenazi, PA-C 11/27/22  ADDENDUM: Hematology/Oncology Attending: I had a face-to-face encounter with the patient today.  I reviewed her records, lab, scan and recommended her care plan.  This is a very pleasant 57 years old white female with  stage IV non-small cell lung cancer, adenocarcinoma diagnosed in April 2023 with no actionable mutations and negative PD-L1 expression.  She is status post palliative radiotherapy to the right hilar region.  She started induction systemic chemotherapy with carboplatin, Alimta and Keytruda for 4 cycles and she is currently on maintenance treatment with Alimta and Keytruda for a total 21 cycle of treatment. The patient has been tolerating her treatment fairly well with no concerning adverse effects. She had repeat CT scan of the chest, abdomen and pelvis performed few days ago but the final report is still pending.  I  personally and independently reviewed the scan images in comparison to the previous imaging studies few months ago and I did not see any significant change but I will wait for the final report for confirmation. I recommended for the patient to continue her current maintenance treatment with Alimta and Keytruda and she will proceed with cycle #21 today. She will come back for follow-up visit in 3 weeks for evaluation before the next cycle of her treatment. She was advised to call immediately if she has any concerning symptoms in the interval. The total time spent in the appointment was 30 minutes. Disclaimer: This note was dictated with voice recognition software. Similar sounding words can inadvertently be transcribed and may be missed upon review. Lajuana Matte, MD

## 2022-11-26 ENCOUNTER — Ambulatory Visit (HOSPITAL_COMMUNITY)
Admission: RE | Admit: 2022-11-26 | Discharge: 2022-11-26 | Disposition: A | Discharge status: Home / Self Care | Payer: BC Managed Care – PPO | Source: Ambulatory Visit | Attending: Internal Medicine | Admitting: Internal Medicine

## 2022-11-26 ENCOUNTER — Other Ambulatory Visit: Payer: Self-pay

## 2022-11-26 DIAGNOSIS — C3490 Malignant neoplasm of unspecified part of unspecified bronchus or lung: Secondary | ICD-10-CM | POA: Insufficient documentation

## 2022-11-26 DIAGNOSIS — J90 Pleural effusion, not elsewhere classified: Secondary | ICD-10-CM | POA: Diagnosis not present

## 2022-11-26 DIAGNOSIS — I700 Atherosclerosis of aorta: Secondary | ICD-10-CM | POA: Diagnosis not present

## 2022-11-26 DIAGNOSIS — J439 Emphysema, unspecified: Secondary | ICD-10-CM | POA: Diagnosis not present

## 2022-11-26 MED ORDER — IOHEXOL 300 MG/ML  SOLN
100.0000 mL | Freq: Once | INTRAMUSCULAR | Status: AC | PRN
  Administered 2022-11-26: 100 mL via INTRAVENOUS

## 2022-11-26 MED ORDER — IOHEXOL 9 MG/ML PO SOLN
500.0000 mL | ORAL | Status: AC
  Administered 2022-11-26: 1000 mL via ORAL

## 2022-11-26 MED ORDER — HEPARIN SOD (PORK) LOCK FLUSH 100 UNIT/ML IV SOLN
500.0000 [IU] | Freq: Once | INTRAVENOUS | Status: AC
  Administered 2022-11-26: 500 [IU] via INTRAVENOUS

## 2022-11-27 ENCOUNTER — Inpatient Hospital Stay: Payer: BC Managed Care – PPO

## 2022-11-27 ENCOUNTER — Inpatient Hospital Stay: Payer: BC Managed Care – PPO | Admitting: Physician Assistant

## 2022-11-27 ENCOUNTER — Inpatient Hospital Stay: Payer: BC Managed Care – PPO | Attending: Radiation Oncology

## 2022-11-27 VITALS — BP 110/79 | HR 101 | Temp 98.5°F | Resp 17 | Wt 112.0 lb

## 2022-11-27 VITALS — BP 103/74 | HR 94 | Resp 16

## 2022-11-27 DIAGNOSIS — E538 Deficiency of other specified B group vitamins: Secondary | ICD-10-CM | POA: Diagnosis not present

## 2022-11-27 DIAGNOSIS — C7971 Secondary malignant neoplasm of right adrenal gland: Secondary | ICD-10-CM | POA: Insufficient documentation

## 2022-11-27 DIAGNOSIS — Z5111 Encounter for antineoplastic chemotherapy: Secondary | ICD-10-CM | POA: Diagnosis not present

## 2022-11-27 DIAGNOSIS — Z5112 Encounter for antineoplastic immunotherapy: Secondary | ICD-10-CM | POA: Diagnosis not present

## 2022-11-27 DIAGNOSIS — Z9049 Acquired absence of other specified parts of digestive tract: Secondary | ICD-10-CM | POA: Insufficient documentation

## 2022-11-27 DIAGNOSIS — C3491 Malignant neoplasm of unspecified part of right bronchus or lung: Secondary | ICD-10-CM | POA: Diagnosis not present

## 2022-11-27 DIAGNOSIS — Z923 Personal history of irradiation: Secondary | ICD-10-CM | POA: Diagnosis not present

## 2022-11-27 DIAGNOSIS — Z8541 Personal history of malignant neoplasm of cervix uteri: Secondary | ICD-10-CM | POA: Insufficient documentation

## 2022-11-27 DIAGNOSIS — C7972 Secondary malignant neoplasm of left adrenal gland: Secondary | ICD-10-CM | POA: Insufficient documentation

## 2022-11-27 DIAGNOSIS — Z85820 Personal history of malignant melanoma of skin: Secondary | ICD-10-CM | POA: Diagnosis not present

## 2022-11-27 DIAGNOSIS — Z9851 Tubal ligation status: Secondary | ICD-10-CM | POA: Insufficient documentation

## 2022-11-27 DIAGNOSIS — Z95828 Presence of other vascular implants and grafts: Secondary | ICD-10-CM

## 2022-11-27 LAB — CBC WITH DIFFERENTIAL (CANCER CENTER ONLY)
Abs Immature Granulocytes: 0.05 10*3/uL (ref 0.00–0.07)
Basophils Absolute: 0.1 10*3/uL (ref 0.0–0.1)
Basophils Relative: 1 %
Eosinophils Absolute: 0.2 10*3/uL (ref 0.0–0.5)
Eosinophils Relative: 3 %
HCT: 34.2 % — ABNORMAL LOW (ref 36.0–46.0)
Hemoglobin: 11.7 g/dL — ABNORMAL LOW (ref 12.0–15.0)
Immature Granulocytes: 1 %
Lymphocytes Relative: 28 %
Lymphs Abs: 1.8 10*3/uL (ref 0.7–4.0)
MCH: 35.1 pg — ABNORMAL HIGH (ref 26.0–34.0)
MCHC: 34.2 g/dL (ref 30.0–36.0)
MCV: 102.7 fL — ABNORMAL HIGH (ref 80.0–100.0)
Monocytes Absolute: 1 10*3/uL (ref 0.1–1.0)
Monocytes Relative: 15 %
Neutro Abs: 3.4 10*3/uL (ref 1.7–7.7)
Neutrophils Relative %: 52 %
Platelet Count: 318 10*3/uL (ref 150–400)
RBC: 3.33 MIL/uL — ABNORMAL LOW (ref 3.87–5.11)
RDW: 14.7 % (ref 11.5–15.5)
Smear Review: NORMAL
WBC Count: 6.4 10*3/uL (ref 4.0–10.5)
nRBC: 0 % (ref 0.0–0.2)

## 2022-11-27 LAB — CMP (CANCER CENTER ONLY)
ALT: 15 U/L (ref 0–44)
AST: 27 U/L (ref 15–41)
Albumin: 3.4 g/dL — ABNORMAL LOW (ref 3.5–5.0)
Alkaline Phosphatase: 75 U/L (ref 38–126)
Anion gap: 6 (ref 5–15)
BUN: 12 mg/dL (ref 6–20)
CO2: 25 mmol/L (ref 22–32)
Calcium: 9.1 mg/dL (ref 8.9–10.3)
Chloride: 106 mmol/L (ref 98–111)
Creatinine: 0.78 mg/dL (ref 0.44–1.00)
GFR, Estimated: >60 mL/min (ref >60)
Glucose, Bld: 114 mg/dL — ABNORMAL HIGH (ref 70–99)
Potassium: 3.8 mmol/L (ref 3.5–5.1)
Sodium: 137 mmol/L (ref 135–145)
Total Bilirubin: 0.2 mg/dL — ABNORMAL LOW (ref 0.3–1.2)
Total Protein: 6.4 g/dL — ABNORMAL LOW (ref 6.5–8.1)

## 2022-11-27 MED ORDER — SODIUM CHLORIDE 0.9 % IV SOLN
500.0000 mg/m2 | Freq: Once | INTRAVENOUS | Status: AC
  Administered 2022-11-27: 700 mg via INTRAVENOUS
  Filled 2022-11-27: qty 20

## 2022-11-27 MED ORDER — SODIUM CHLORIDE 0.9% FLUSH
10.0000 mL | Freq: Once | INTRAVENOUS | Status: AC
  Administered 2022-11-27: 10 mL

## 2022-11-27 MED ORDER — HEPARIN SOD (PORK) LOCK FLUSH 100 UNIT/ML IV SOLN
500.0000 [IU] | Freq: Once | INTRAVENOUS | Status: AC | PRN
  Administered 2022-11-27: 500 [IU]

## 2022-11-27 MED ORDER — SODIUM CHLORIDE 0.9% FLUSH
10.0000 mL | INTRAVENOUS | Status: DC | PRN
  Administered 2022-11-27: 10 mL

## 2022-11-27 MED ORDER — SODIUM CHLORIDE 0.9 % IV SOLN: Freq: Once | INTRAVENOUS | Status: AC

## 2022-11-27 MED ORDER — PROCHLORPERAZINE MALEATE 10 MG PO TABS
10.0000 mg | ORAL_TABLET | Freq: Once | ORAL | Status: AC
  Administered 2022-11-27: 10 mg via ORAL
  Filled 2022-11-27: qty 1

## 2022-11-27 MED ORDER — SODIUM CHLORIDE 0.9 % IV SOLN
200.0000 mg | Freq: Once | INTRAVENOUS | Status: AC
  Administered 2022-11-27: 200 mg via INTRAVENOUS
  Filled 2022-11-27: qty 200

## 2022-11-27 NOTE — Patient Instructions (Signed)
 [facility]  Discharge Instructions: Thank you for choosing [facility] to provide your oncology and hematology care.   If you have a lab appointment with the [facility], please go directly to the [facility] and check in at the registration area.   Wear comfortable clothing and clothing appropriate for easy access to any Portacath or PICC line.   We strive to give you quality time with your provider. You may need to reschedule your appointment if you arrive late (15 or more minutes).  Arriving late affects you and other patients whose appointments are after yours.  Also, if you miss three or more appointments without notifying the office, you may be dismissed from the clinic at the provider's discretion.      For prescription refill requests, have your pharmacy contact our office and allow 72 hours for refills to be completed.    Today you received the following chemotherapy and/or immunotherapy agents: Keytruda, Alimta.       To help prevent nausea and vomiting after your treatment, we encourage you to take your nausea medication as directed.  BELOW ARE SYMPTOMS THAT SHOULD BE REPORTED IMMEDIATELY: *FEVER GREATER THAN 100.4 F (38 C) OR HIGHER *CHILLS OR SWEATING *NAUSEA AND VOMITING THAT IS NOT CONTROLLED WITH YOUR NAUSEA MEDICATION *UNUSUAL SHORTNESS OF BREATH *UNUSUAL BRUISING OR BLEEDING *URINARY PROBLEMS (pain or burning when urinating, or frequent urination) *BOWEL PROBLEMS (unusual diarrhea, constipation, pain near the anus) TENDERNESS IN MOUTH AND THROAT WITH OR WITHOUT PRESENCE OF ULCERS (sore throat, sores in mouth, or a toothache) UNUSUAL RASH, SWELLING OR PAIN  UNUSUAL VAGINAL DISCHARGE OR ITCHING   Items with * indicate a potential emergency and should be followed up as soon as possible or go to the Emergency Department if any problems should occur.  Please show the CHEMOTHERAPY ALERT CARD or IMMUNOTHERAPY ALERT CARD  at check-in to the Emergency Department and triage nurse.  Should you have questions after your visit or need to cancel or reschedule your appointment, please contact [facility]  Dept: [phone]  and follow the prompts.  Office hours are 8:00 a.m. to 4:30 p.m. Monday - Friday. Please note that voicemails left after 4:00 p.m. may not be returned until the following business day.  We are closed weekends and major holidays. You have access to a nurse at all times for urgent questions. Please call the main number to the clinic Dept: [phone] and follow the prompts.   For any non-urgent questions, you may also contact your provider using MyChart. We now offer e-Visits for anyone 34 and older to request care online for non-urgent symptoms. For details visit [email or web address].   Also download the MyChart app! Go to the app store, search "MyChart", open the app, select [facility], and log in with your MyChart username and password.

## 2022-12-02 ENCOUNTER — Encounter: Payer: Self-pay | Admitting: Physician Assistant

## 2022-12-02 ENCOUNTER — Other Ambulatory Visit: Payer: Self-pay | Admitting: Physician Assistant

## 2022-12-02 DIAGNOSIS — C3401 Malignant neoplasm of right main bronchus: Secondary | ICD-10-CM

## 2022-12-02 MED ORDER — PROCHLORPERAZINE MALEATE 10 MG PO TABS
10.0000 mg | ORAL_TABLET | Freq: Four times a day (QID) | ORAL | 2 refills | Status: DC | PRN
Start: 2022-12-02 — End: 2023-01-08

## 2022-12-12 NOTE — Progress Notes (Signed)
[facility] OFFICE PROGRESS NOTE  [name], FNP [address] [place] [address]  DIAGNOSIS: Stage IV (T2b, N2, M1 C) non-small cell lung cancer, adenocarcinoma presented with right hilar mass in addition to mediastinal and upper abdominal lymphadenopathy in addition to bilateral adrenal metastases and metastatic disease in the musculature of the lower back.  This was diagnosed in April 2023.   DETECTED ALTERATION(S) / BIOMARKER(S)     % CFDNA OR AMPLIFICATION        ASSOCIATED FDA-APPROVED THERAPIES         CLINICAL TRIAL AVAILABILITY [id number] 0.5% None     Yes [id number] (Splice Site Indel) 0.6% None     Yes   PD-L1 expression 0%  PRIOR THERAPY: Palliative radiotherapy to the right hilar region under the care of Dr. Mitzi Hansen   CURRENT THERAPY: Systemic chemotherapy with carboplatin for AUC of 5, Alimta 500 Mg/M2 and Keytruda 200 Mg IV every 3 weeks.  First dose August 29, 2021. Status post 22 cycles.  Starting from cycle #5, the patient started maintenance Alimta and Keytruda.    INTERVAL HISTORY: April Walsh 57 y.o. female returns to the clinic today for a follow-up visit. The patient is feeling fairly well today without any concerning complaints except new onset back pain.  Her pain gradually started on Friday 12/13/22 but was mild.  Her pain has worsened over the weekend.  She localizes the pain to the mid low back and bilateral low back.  Her pain does not radiate.  She describes her pain as a dull ache.  She denies any recent lifting or injuries.  Denies any falls or car accidents.  She does have a history of osteoporosis.  She denies any associated symptoms such as fevers, chills, dysuria, malodorous urine, hematuria, abdominal pain, nausea, vomiting, blood in stool, diarrhea, or constipation.  Pain is exacerbated by standing and improved with sitting down.  He tries not to take any medication but tried Salonpas patches without any improvement.   She also tried taking Aleve last night which took the edge off.  She had a CT scan of the abdomen pelvis on 11/26/2022 which did not show any acute findings in the abdomen or pelvis.  She has a stable left renal cyst.  No nephrolithiasis.  No hydronephrosis.  Her bladder which is within normal limits.  No abdominal aneurysm.  She has some mild degenerative changes at L5 and S1.   The patient is currently undergoing chemotherapy and immunotherapy.  She denies any fever, chills, or night sweats. Denies changes in her baseline dyspnea on exertion. Denies significant cough. She denies any chest pain or hemoptysis. Denies any headache or visual changes.  She denies any rashes or skin changes. She is here today for evaluation and to review her scan  before starting cycle #23     MEDICAL HISTORY: Past Medical History:  Diagnosis Date   Anemia    Asthma    Bronchitis    Cancer (HCC)    basal cell melanoma, cervical cancer   COPD (chronic obstructive pulmonary disease) (HCC)    "early stages of COPD"   Headache    Lung cancer (HCC) 08/13/2021    ALLERGIES:  has No Known Allergies.  MEDICATIONS:  Current Outpatient Medications  Medication Sig Dispense Refill   apixaban (ELIQUIS) 2.5 MG TABS tablet Take 1 tablet (2.5 mg total) by mouth 2 (two) times daily. 60 tablet 0   atorvastatin (LIPITOR) 80 MG tablet TAKE 1 TABLET  BY MOUTH EVERY DAY 30 tablet 3   BREZTRI AEROSPHERE 160-9-4.8 MCG/ACT AERO INHALE 2 PUFFS INTO THE LUNGS IN THE MORNING AND AT BEDTIME. 10.7 each 11   folic acid (FOLVITE) 1 MG tablet TAKE 1 TABLET BY MOUTH EVERY DAY 30 tablet 2   lidocaine-prilocaine (EMLA) cream Apply 1 Application topically as needed. 30 g 2   prochlorperazine (COMPAZINE) 10 MG tablet Take 1 tablet (10 mg total) by mouth every 6 (six) hours as needed. 30 tablet 2   No current facility-administered medications for this visit.   Facility-Administered Medications Ordered in Other Visits  Medication Dose Route  Frequency Provider Last Rate Last Admin   cyanocobalamin (VITAMIN B12) 1000 MCG/ML injection            prochlorperazine (COMPAZINE) 10 MG tablet             SURGICAL HISTORY:  Past Surgical History:  Procedure Laterality Date   APPENDECTOMY     1996   BREAST BIOPSY Right    2010-2015   BRONCHIAL BIOPSY  08/13/2021   Procedure: BRONCHIAL BIOPSIES;  Surgeon: Leslye Peer, MD;  Location: [facility] ENDOSCOPY;  Service: Pulmonary;;   BRONCHIAL BRUSHINGS  08/13/2021   Procedure: BRONCHIAL BRUSHINGS;  Surgeon: Leslye Peer, MD;  Location: [facility] ENDOSCOPY;  Service: Pulmonary;;   BRONCHIAL NEEDLE ASPIRATION BIOPSY  08/13/2021   Procedure: BRONCHIAL NEEDLE ASPIRATION BIOPSIES;  Surgeon: Leslye Peer, MD;  Location: [facility] ENDOSCOPY;  Service: Pulmonary;;   CESAREAN SECTION     1984, 1987   FOOT SURGERY Right    2 pins and screw 1999   FOOT SURGERY Left 06/21/2021   screw and 2 pins   HEMOSTASIS CONTROL  08/13/2021   Procedure: HEMOSTASIS CONTROL;  Surgeon: Leslye Peer, MD;  Location: [facility] ENDOSCOPY;  Service: Pulmonary;;   IR IMAGING GUIDED PORT INSERTION  11/26/2021   MELANOMA EXCISION Left    basal cell melanoma 2011   PARTIAL HYSTERECTOMY     1994   TUBAL LIGATION     1987   VIDEO BRONCHOSCOPY  08/13/2021   Procedure: VIDEO BRONCHOSCOPY WITHOUT FLUORO;  Surgeon: Leslye Peer, MD;  Location: [facility] ENDOSCOPY;  Service: Pulmonary;;   VIDEO BRONCHOSCOPY WITH ENDOBRONCHIAL ULTRASOUND N/A 08/13/2021   Procedure: VIDEO BRONCHOSCOPY WITH ENDOBRONCHIAL ULTRASOUND;  Surgeon: Leslye Peer, MD;  Location: [facility] ENDOSCOPY;  Service: Pulmonary;  Laterality: N/A;    REVIEW OF SYSTEMS:   Constitutional: Negative for appetite change, chills, fatigue, fever and unexpected weight change.  HENT: Negative for mouth sores, nosebleeds, sore throat and trouble swallowing.   Eyes: Negative for eye problems and icterus.  Respiratory: Negative for cough (not constant), hemoptysis, shortness of breath and wheezing.    Cardiovascular: Negative for chest pain and leg swelling.  Gastrointestinal: Negative for abdominal pain, constipation, diarrhea, nausea and vomiting.  Genitourinary: Negative for bladder incontinence, difficulty urinating, dysuria, frequency and hematuria.   Musculoskeletal: Positive for back pain. Negative for gait problem, neck pain and neck stiffness.  Skin: Negative for itching and rash.  Neurological: Negative for dizziness, extremity weakness, gait problem, headaches, light-headedness and seizures.  Hematological: Negative for adenopathy. Does not bruise/bleed easily.  Psychiatric/Behavioral: Negative for confusion, depression and sleep disturbance. The patient is not nervous/anxious.    PHYSICAL EXAMINATION:  There were no vitals taken for this visit.  ECOG PERFORMANCE STATUS: 1  Physical Exam  Constitutional: Oriented to person, place, and time and positive for thin appearing female and in no acute distress.   HENT:  Head: Normocephalic and atraumatic.  Mouth/Throat: Oropharynx is clear and moist. No oropharyngeal exudate.  Eyes: Conjunctivae are normal. Right eye exhibits no discharge. Left eye exhibits no discharge. No scleral icterus.  Neck: Normal range of motion. Neck supple.  Cardiovascular: Normal rate, regular rhythm, normal heart sounds and intact distal pulses.   Pulmonary/Chest: Effort normal and breath sounds normal. No respiratory distress. No wheezes. No rales.  Abdominal: Soft. Bowel sounds are normal. Exhibits no distension and no mass. There is no tenderness.  Musculoskeletal: Limited ROM due to pain, mostly with flexion. Pain improves with extension.  Exhibits no edema.  Lymphadenopathy:    No cervical adenopathy.  Neurological: Alert and oriented to person, place, and time. Exhibits normal muscle tone. Gait normal. Coordination normal.  Skin: Not diaphoretic. No erythema. No pallor.  Psychiatric: Mood, memory and judgment normal.  Vitals  reviewed.  LABORATORY DATA: Lab Results  Component Value Date   WBC 6.4 11/27/2022   HGB 11.7 (L) 11/27/2022   HCT 34.2 (L) 11/27/2022   MCV 102.7 (H) 11/27/2022   PLT 318 11/27/2022      Chemistry      Component Value Date/Time   NA 137 11/27/2022 0741   K 3.8 11/27/2022 0741   CL 106 11/27/2022 0741   CO2 25 11/27/2022 0741   BUN 12 11/27/2022 0741   CREATININE 0.78 11/27/2022 0741      Component Value Date/Time   CALCIUM 9.1 11/27/2022 0741   ALKPHOS 75 11/27/2022 0741   AST 27 11/27/2022 0741   ALT 15 11/27/2022 0741   BILITOT 0.2 (L) 11/27/2022 0741       RADIOGRAPHIC STUDIES:  CT Chest W Contrast  Result Date: 12/01/2022 CLINICAL DATA:  Follow-up non-small cell lung cancer EXAM: CT CHEST, ABDOMEN, AND PELVIS WITH CONTRAST TECHNIQUE: Multidetector CT imaging of the chest, abdomen and pelvis was performed following the standard protocol during bolus administration of intravenous contrast. RADIATION DOSE REDUCTION: This exam was performed according to the departmental dose-optimization program which includes automated exposure control, adjustment of the mA and/or kV according to patient size and/or use of iterative reconstruction technique. CONTRAST:  OMNIPAQUE IOHEXOL 300 MG/ML  SOLN COMPARISON:  09/24/2022 FINDINGS: CT CHEST FINDINGS Cardiovascular: The heart is normal in size. No pericardial effusion. No evidence of thoracic aortic aneurysm. Mild atherosclerotic calcifications of the aortic arch. Right chest port terminates at the cavoatrial junction. Mediastinum/Nodes: No suspicious mediastinal lymphadenopathy. Visualized thyroid is unremarkable. Lungs/Pleura: Stable radiation changes in the right hemithorax, medial right upper lobe predominant (series 4/image 29). Mild centrilobular emphysematous changes, upper lung predominant. No suspicious pulmonary nodules. No focal consolidation. Small to moderate right pleural effusion, unchanged. No pneumothorax.  Musculoskeletal: Thoracic spine is within normal limits. CT ABDOMEN PELVIS FINDINGS Hepatobiliary: Liver is within normal limits. Gallbladder is unremarkable. No intrahepatic or extrahepatic duct dilatation. Pancreas: Within normal limits. Spleen: Within normal limits. Adrenals/Urinary Tract: Adrenal glands are within normal limits. 3.3 cm simple left lower pole renal cyst (series 7/image 26), benign (Bosniak I). No follow-up is recommended. Right kidney is within normal limits.  No hydronephrosis. Bladder is within normal limits. Stomach/Bowel: Stomach is within normal limits. No evidence of bowel obstruction. Prior appendectomy. Mild sigmoid diverticulosis, without evidence of diverticulitis. Vascular/Lymphatic: No evidence of abdominal aortic aneurysm. Atherosclerotic calcifications of the abdominal aorta and branch vessels. No suspicious abdominopelvic lymphadenopathy. Reproductive: Status post hysterectomy. No adnexal masses. Other: No abdominopelvic ascites. Musculoskeletal: Mild degenerative changes at L5-S1. IMPRESSION: Stable radiation changes in the right hemithorax. No  evidence of recurrent or metastatic disease. Small to moderate right pleural effusion, unchanged. Aortic Atherosclerosis (ICD10-I70.0) and Emphysema (ICD10-J43.9). Electronically Signed   By: Charline Bills M.D.   On: 12/01/2022 02:01   CT Abdomen Pelvis W Contrast  Result Date: 12/01/2022 CLINICAL DATA:  Follow-up non-small cell lung cancer EXAM: CT CHEST, ABDOMEN, AND PELVIS WITH CONTRAST TECHNIQUE: Multidetector CT imaging of the chest, abdomen and pelvis was performed following the standard protocol during bolus administration of intravenous contrast. RADIATION DOSE REDUCTION: This exam was performed according to the departmental dose-optimization program which includes automated exposure control, adjustment of the mA and/or kV according to patient size and/or use of iterative reconstruction technique. CONTRAST:  OMNIPAQUE  IOHEXOL 300 MG/ML  SOLN COMPARISON:  09/24/2022 FINDINGS: CT CHEST FINDINGS Cardiovascular: The heart is normal in size. No pericardial effusion. No evidence of thoracic aortic aneurysm. Mild atherosclerotic calcifications of the aortic arch. Right chest port terminates at the cavoatrial junction. Mediastinum/Nodes: No suspicious mediastinal lymphadenopathy. Visualized thyroid is unremarkable. Lungs/Pleura: Stable radiation changes in the right hemithorax, medial right upper lobe predominant (series 4/image 29). Mild centrilobular emphysematous changes, upper lung predominant. No suspicious pulmonary nodules. No focal consolidation. Small to moderate right pleural effusion, unchanged. No pneumothorax. Musculoskeletal: Thoracic spine is within normal limits. CT ABDOMEN PELVIS FINDINGS Hepatobiliary: Liver is within normal limits. Gallbladder is unremarkable. No intrahepatic or extrahepatic duct dilatation. Pancreas: Within normal limits. Spleen: Within normal limits. Adrenals/Urinary Tract: Adrenal glands are within normal limits. 3.3 cm simple left lower pole renal cyst (series 7/image 26), benign (Bosniak I). No follow-up is recommended. Right kidney is within normal limits.  No hydronephrosis. Bladder is within normal limits. Stomach/Bowel: Stomach is within normal limits. No evidence of bowel obstruction. Prior appendectomy. Mild sigmoid diverticulosis, without evidence of diverticulitis. Vascular/Lymphatic: No evidence of abdominal aortic aneurysm. Atherosclerotic calcifications of the abdominal aorta and branch vessels. No suspicious abdominopelvic lymphadenopathy. Reproductive: Status post hysterectomy. No adnexal masses. Other: No abdominopelvic ascites. Musculoskeletal: Mild degenerative changes at L5-S1. IMPRESSION: Stable radiation changes in the right hemithorax. No evidence of recurrent or metastatic disease. Small to moderate right pleural effusion, unchanged. Aortic Atherosclerosis (ICD10-I70.0) and  Emphysema (ICD10-J43.9). Electronically Signed   By: Charline Bills M.D.   On: 12/01/2022 02:01     ASSESSMENT/PLAN:  This is a very pleasant 57 years old white female with Stage IV (T2b, N2, M1 C) non-small cell lung cancer, adenocarcinoma presented with right hilar mass in addition to mediastinal and upper abdominal lymphadenopathy in addition to bilateral adrenal metastases and metastatic disease in the musculature of the lower back.  This was diagnosed in April 2023.   The molecular studies by [employer] showed no actionable mutations and the patient has negative PD-L1 expression. She completed palliative radiotherapy to the right hilar region under the care of Dr. Mitzi Hansen.   She completed palliative radiation to the right hilar area under the care of Dr. Mitzi Hansen.   She is currently undergoing palliative systemic chemotherapy with carboplatin for an AUC of 5, Alimta 500 mg per metered squared, Keytruda 200 mg IV every 3 weeks.  She is status post 22 cycles.  Her first dose was on 08/29/2021. Starting from cycle #5, she started maintenance alimta and [place].  Labs were reviewed. I reviewed her symptoms with Dr. Arbutus Ped. She is ok to treat with her back pain. We will arrange for UA and culture to assess for UTI. I will also arrange for XRAY of the spine to assess for fractures given her osteoporosis.  She recently had CT AP a few weeks ago which did not shoe any acute process. Recommend that she proceed with cycle #23 today as scheduled.   I gave her a copy of her scan report that was not available at the time of her last visit.   I will also send her a few tablets of Norco if needed for pain.  She was also advised to use Salonpas patches, pads, and rest.  We would not recommend steroids at this time due to her current immunotherapy use.  However I did advise the patient if she developed any new or worsening symptoms (fevers, chills, N/V/changes in bowel habits, dysuria, abdominal pain, loss of  bowel/bladder function, lower extremity weakness) and if her pain did not improve as expected, I would recommend that she be reevaluated.  She understands that there is some limitations to x-rays.   We will see her back in 3 weeks for evaluation before starting cycle #24  The patient was advised to call immediately if she has any concerning symptoms in the interval. The patient voices understanding of current disease status and treatment options and is in agreement with the current care plan. All questions were answered. The patient knows to call the clinic with any problems, questions or concerns. We can certainly see the patient much sooner if necessary      No orders of the defined types were placed in this encounter.    The total time spent in the appointment was 20-29 minutes   L , PA-C 12/12/22

## 2022-12-18 ENCOUNTER — Other Ambulatory Visit: Payer: Self-pay

## 2022-12-18 ENCOUNTER — Inpatient Hospital Stay: Payer: BC Managed Care – PPO

## 2022-12-18 ENCOUNTER — Inpatient Hospital Stay: Payer: BC Managed Care – PPO | Attending: Radiation Oncology

## 2022-12-18 ENCOUNTER — Inpatient Hospital Stay: Payer: BC Managed Care – PPO | Admitting: Physician Assistant

## 2022-12-18 VITALS — BP 132/78 | HR 95 | Temp 97.9°F | Resp 16 | Wt 115.6 lb

## 2022-12-18 DIAGNOSIS — C7971 Secondary malignant neoplasm of right adrenal gland: Secondary | ICD-10-CM | POA: Insufficient documentation

## 2022-12-18 DIAGNOSIS — Z9049 Acquired absence of other specified parts of digestive tract: Secondary | ICD-10-CM | POA: Insufficient documentation

## 2022-12-18 DIAGNOSIS — Z9851 Tubal ligation status: Secondary | ICD-10-CM | POA: Insufficient documentation

## 2022-12-18 DIAGNOSIS — M549 Dorsalgia, unspecified: Secondary | ICD-10-CM | POA: Diagnosis not present

## 2022-12-18 DIAGNOSIS — E538 Deficiency of other specified B group vitamins: Secondary | ICD-10-CM | POA: Diagnosis not present

## 2022-12-18 DIAGNOSIS — C3491 Malignant neoplasm of unspecified part of right bronchus or lung: Secondary | ICD-10-CM | POA: Diagnosis not present

## 2022-12-18 DIAGNOSIS — Z5112 Encounter for antineoplastic immunotherapy: Secondary | ICD-10-CM | POA: Diagnosis not present

## 2022-12-18 DIAGNOSIS — Z923 Personal history of irradiation: Secondary | ICD-10-CM | POA: Insufficient documentation

## 2022-12-18 DIAGNOSIS — Z8541 Personal history of malignant neoplasm of cervix uteri: Secondary | ICD-10-CM | POA: Diagnosis not present

## 2022-12-18 DIAGNOSIS — Z85820 Personal history of malignant melanoma of skin: Secondary | ICD-10-CM | POA: Insufficient documentation

## 2022-12-18 DIAGNOSIS — Z95828 Presence of other vascular implants and grafts: Secondary | ICD-10-CM

## 2022-12-18 DIAGNOSIS — Z5111 Encounter for antineoplastic chemotherapy: Secondary | ICD-10-CM | POA: Insufficient documentation

## 2022-12-18 DIAGNOSIS — C7972 Secondary malignant neoplasm of left adrenal gland: Secondary | ICD-10-CM | POA: Diagnosis not present

## 2022-12-18 LAB — CBC WITH DIFFERENTIAL (CANCER CENTER ONLY)
Abs Immature Granulocytes: 0.03 10*3/uL (ref 0.00–0.07)
Basophils Absolute: 0.1 10*3/uL (ref 0.0–0.1)
Basophils Relative: 1 %
Eosinophils Absolute: 0.2 10*3/uL (ref 0.0–0.5)
Eosinophils Relative: 4 %
HCT: 33.2 % — ABNORMAL LOW (ref 36.0–46.0)
Hemoglobin: 11.2 g/dL — ABNORMAL LOW (ref 12.0–15.0)
Immature Granulocytes: 1 %
Lymphocytes Relative: 24 %
Lymphs Abs: 1.4 10*3/uL (ref 0.7–4.0)
MCH: 34.5 pg — ABNORMAL HIGH (ref 26.0–34.0)
MCHC: 33.7 g/dL (ref 30.0–36.0)
MCV: 102.2 fL — ABNORMAL HIGH (ref 80.0–100.0)
Monocytes Absolute: 0.9 10*3/uL (ref 0.1–1.0)
Monocytes Relative: 16 %
Neutro Abs: 3 10*3/uL (ref 1.7–7.7)
Neutrophils Relative %: 54 %
Platelet Count: 303 10*3/uL (ref 150–400)
RBC: 3.25 MIL/uL — ABNORMAL LOW (ref 3.87–5.11)
RDW: 15.2 % (ref 11.5–15.5)
WBC Count: 5.5 10*3/uL (ref 4.0–10.5)
nRBC: 0 % (ref 0.0–0.2)

## 2022-12-18 LAB — CMP (CANCER CENTER ONLY)
ALT: 17 U/L (ref 0–44)
AST: 27 U/L (ref 15–41)
Albumin: 3.4 g/dL — ABNORMAL LOW (ref 3.5–5.0)
Alkaline Phosphatase: 70 U/L (ref 38–126)
Anion gap: 6 (ref 5–15)
BUN: 12 mg/dL (ref 6–20)
CO2: 26 mmol/L (ref 22–32)
Calcium: 8.9 mg/dL (ref 8.9–10.3)
Chloride: 107 mmol/L (ref 98–111)
Creatinine: 0.64 mg/dL (ref 0.44–1.00)
GFR, Estimated: >60 mL/min (ref >60)
Glucose, Bld: 73 mg/dL (ref 70–99)
Potassium: 4 mmol/L (ref 3.5–5.1)
Sodium: 139 mmol/L (ref 135–145)
Total Bilirubin: 0.2 mg/dL — ABNORMAL LOW (ref 0.3–1.2)
Total Protein: 6.3 g/dL — ABNORMAL LOW (ref 6.5–8.1)

## 2022-12-18 LAB — URINALYSIS, COMPLETE (UACMP) WITH MICROSCOPIC
Bacteria, UA: NONE SEEN
Bilirubin Urine: NEGATIVE
Glucose, UA: NEGATIVE mg/dL
Hgb urine dipstick: NEGATIVE
Ketones, ur: NEGATIVE mg/dL
Nitrite: NEGATIVE
Protein, ur: NEGATIVE mg/dL
Specific Gravity, Urine: 1.009 (ref 1.005–1.030)
pH: 5 (ref 5.0–8.0)

## 2022-12-18 MED ORDER — SODIUM CHLORIDE 0.9% FLUSH
10.0000 mL | INTRAVENOUS | Status: DC | PRN
  Administered 2022-12-18: 10 mL

## 2022-12-18 MED ORDER — HEPARIN SOD (PORK) LOCK FLUSH 100 UNIT/ML IV SOLN
500.0000 [IU] | Freq: Once | INTRAVENOUS | Status: AC | PRN
  Administered 2022-12-18: 500 [IU]

## 2022-12-18 MED ORDER — SODIUM CHLORIDE 0.9% FLUSH
10.0000 mL | Freq: Once | INTRAVENOUS | Status: AC
  Administered 2022-12-18: 10 mL

## 2022-12-18 MED ORDER — SODIUM CHLORIDE 0.9 % IV SOLN
500.0000 mg/m2 | Freq: Once | INTRAVENOUS | Status: AC
  Administered 2022-12-18: 700 mg via INTRAVENOUS
  Filled 2022-12-18: qty 20

## 2022-12-18 MED ORDER — SODIUM CHLORIDE 0.9 % IV SOLN
200.0000 mg | Freq: Once | INTRAVENOUS | Status: AC
  Administered 2022-12-18: 200 mg via INTRAVENOUS
  Filled 2022-12-18: qty 200

## 2022-12-18 MED ORDER — HYDROCODONE-ACETAMINOPHEN 5-325 MG PO TABS
1.0000 | ORAL_TABLET | Freq: Four times a day (QID) | ORAL | 0 refills | Status: DC | PRN
Start: 2022-12-18 — End: 2022-12-30

## 2022-12-18 MED ORDER — SODIUM CHLORIDE 0.9 % IV SOLN: Freq: Once | INTRAVENOUS | Status: AC

## 2022-12-18 MED ORDER — PROCHLORPERAZINE MALEATE 10 MG PO TABS
10.0000 mg | ORAL_TABLET | Freq: Once | ORAL | Status: AC
  Administered 2022-12-18: 10 mg via ORAL
  Filled 2022-12-18: qty 1

## 2022-12-30 ENCOUNTER — Other Ambulatory Visit (HOSPITAL_COMMUNITY): Payer: Self-pay

## 2022-12-30 ENCOUNTER — Encounter: Payer: Self-pay | Admitting: Internal Medicine

## 2022-12-30 ENCOUNTER — Other Ambulatory Visit: Payer: Self-pay | Admitting: Physician Assistant

## 2022-12-30 DIAGNOSIS — C3491 Malignant neoplasm of unspecified part of right bronchus or lung: Secondary | ICD-10-CM

## 2022-12-30 DIAGNOSIS — M549 Dorsalgia, unspecified: Secondary | ICD-10-CM

## 2022-12-30 MED ORDER — HYDROCODONE-ACETAMINOPHEN 5-325 MG PO TABS
1.0000 | ORAL_TABLET | Freq: Four times a day (QID) | ORAL | 0 refills | Status: DC | PRN
  Filled 2022-12-30: qty 15, 4d supply, fill #0

## 2022-12-30 NOTE — Telephone Encounter (Signed)
Cassandra Heilingoetter, PA response to patient's earlier [employer] regarding [facility] not able to obtain med prescribed on 12/18/22: Can you check with her if there is a different pharmacy that I can send to? Also, can you call the pharmacy and see what they have in stock. I did not receive any notification about this not being available so please let her know I apologize for her pain and that I am glad she followed up and we will get this squared away.    [facility]ed [facility] [place], [place]. Was informed by Pietro Cassis in pharmacy that Hydrocodone-acetaminophen 5-325 prescribed by C. Heilingoetter, PA is on back order and not available at their pharmacy. They stated they did not know of any [facility] in area with this medication.  Contacted [facility]. They have mediation available and can fill prescription today. Contacted patient with this information and she asked if Ms. Heilingoetter would send it to [facility].  Informed Ms. Heilingoetter, PA and she sent new rx to [facility].   Patient informed and she verbalized understanding, stating that her husband can pick up rx today.

## 2023-01-03 NOTE — Progress Notes (Unsigned)
[facility] OFFICE PROGRESS NOTE  [name], FNP [address] [place] [address]  DIAGNOSIS:  Stage IV (T2b, N2, M1 C) non-small cell lung cancer, adenocarcinoma presented with right hilar mass in addition to mediastinal and upper abdominal lymphadenopathy in addition to bilateral adrenal metastases and metastatic disease in the musculature of the lower back.  This was diagnosed in April 2023.   DETECTED ALTERATION(S) / BIOMARKER(S)     % CFDNA OR AMPLIFICATION        ASSOCIATED FDA-APPROVED THERAPIES         CLINICAL TRIAL AVAILABILITY [id number] 0.5% None     Yes [id number] (Splice Site Indel) 0.6% None     Yes   PD-L1 expression 0%  PRIOR THERAPY: Palliative radiotherapy to the right hilar region under the care of Dr. Mitzi Hansen   CURRENT THERAPY: Systemic chemotherapy with carboplatin for AUC of 5, Alimta 500 Mg/M2 and Keytruda 200 Mg IV every 3 weeks.  First dose August 29, 2021. Status post 23 cycles.  Starting from cycle #5, the patient started maintenance Alimta and Keytruda.    INTERVAL HISTORY: Zunaira Sorden 57 y.o. female returns to the clinic today for a follow-up visit.  The patient was last seen by myself 3 weeks ago.  The patient typically undergoes treatment with Alimta and Keytruda and she tolerates it well without any concerning adverse side effects.  At her last appointment she was endorsing new onset mid low back bilateral back pain.  She describes it as a dull ache.  She denies any traumas or injuries.  I ordered a x-ray but she did not have this performed.  I also gave her the contact to [facility] urgent care.  I also sent her a prescription of a couple tablets of norco. She took one tablet and it did not agree with her and made her loopy. However, her pain did improve and she is feeling back to baseline today.    Of note, she had a CT AP on 11/26/22 which did not show any acute findings in the abdomen or pelvis. She has a stable  left renal cyst. No nephrolithiasis. No hydronephrosis. Her bladder which is within normal limits. No abdominal aneurysm. She has some mild degenerative changes at L5 and S1.   Regarding her chemotherapy and immunotherapy she denies any fever, chills, or night sweats.  Her weight is fairly stable. Denies any changes in her baseline  baseline dyspnea on exertion. Denies significant cough. She denies any chest pain or hemoptysis. She has mild seasonal allergies but she states she has not needed to take anything for it since it is mild. Denies any headache or visual changes. No nausea, diarrhea, vomiting, or constipation. She denies any rashes or skin changes. She is here today for evaluation before starting cycle #24.    MEDICAL HISTORY: Past Medical History:  Diagnosis Date   Anemia    Asthma    Bronchitis    Cancer (HCC)    basal cell melanoma, cervical cancer   COPD (chronic obstructive pulmonary disease) (HCC)    "early stages of COPD"   Headache    Lung cancer (HCC) 08/13/2021    ALLERGIES:  has No Known Allergies.  MEDICATIONS:  Current Outpatient Medications  Medication Sig Dispense Refill   apixaban (ELIQUIS) 2.5 MG TABS tablet Take 1 tablet (2.5 mg total) by mouth 2 (two) times daily. 60 tablet 0   atorvastatin (LIPITOR) 80 MG tablet TAKE 1 TABLET BY MOUTH EVERY DAY  30 tablet 3   BREZTRI AEROSPHERE 160-9-4.8 MCG/ACT AERO INHALE 2 PUFFS INTO THE LUNGS IN THE MORNING AND AT BEDTIME. 10.7 each 11   folic acid (FOLVITE) 1 MG tablet TAKE 1 TABLET BY MOUTH EVERY DAY 30 tablet 2   HYDROcodone-acetaminophen (NORCO) 5-325 MG tablet Take 1 tablet by mouth every 6 (six) hours as needed for moderate pain. 15 tablet 0   lidocaine-prilocaine (EMLA) cream Apply 1 Application topically as needed. 30 g 2   prochlorperazine (COMPAZINE) 10 MG tablet Take 1 tablet (10 mg total) by mouth every 6 (six) hours as needed. 30 tablet 2   No current facility-administered medications for this visit.    Facility-Administered Medications Ordered in Other Visits  Medication Dose Route Frequency Provider Last Rate Last Admin   0.9 %  sodium chloride infusion   Intravenous Once Si Gaul, MD       cyanocobalamin (VITAMIN B12) 1000 MCG/ML injection            cyanocobalamin (VITAMIN B12) injection 1,000 mcg  1,000 mcg Intramuscular Once Si Gaul, MD       pembrolizumab [facility]) 200 mg in sodium chloride 0.9 % 50 mL chemo infusion  200 mg Intravenous Once Si Gaul, MD       PEMEtrexed (ALIMTA) 700 mg in sodium chloride 0.9 % 100 mL chemo infusion  500 mg/m2 (Treatment Plan Recorded) Intravenous Once Si Gaul, MD       prochlorperazine (COMPAZINE) 10 MG tablet            prochlorperazine (COMPAZINE) tablet 10 mg  10 mg Oral Once Si Gaul, MD        SURGICAL HISTORY:  Past Surgical History:  Procedure Laterality Date   APPENDECTOMY     1996   BREAST BIOPSY Right    2010-2015   BRONCHIAL BIOPSY  08/13/2021   Procedure: BRONCHIAL BIOPSIES;  Surgeon: Leslye Peer, MD;  Location: [facility] ENDOSCOPY;  Service: Pulmonary;;   BRONCHIAL BRUSHINGS  08/13/2021   Procedure: BRONCHIAL BRUSHINGS;  Surgeon: Leslye Peer, MD;  Location: [facility] ENDOSCOPY;  Service: Pulmonary;;   BRONCHIAL NEEDLE ASPIRATION BIOPSY  08/13/2021   Procedure: BRONCHIAL NEEDLE ASPIRATION BIOPSIES;  Surgeon: Leslye Peer, MD;  Location: [facility] ENDOSCOPY;  Service: Pulmonary;;   CESAREAN SECTION     1984, 1987   FOOT SURGERY Right    2 pins and screw 1999   FOOT SURGERY Left 06/21/2021   screw and 2 pins   HEMOSTASIS CONTROL  08/13/2021   Procedure: HEMOSTASIS CONTROL;  Surgeon: Leslye Peer, MD;  Location: [facility] ENDOSCOPY;  Service: Pulmonary;;   IR IMAGING GUIDED PORT INSERTION  11/26/2021   MELANOMA EXCISION Left    basal cell melanoma 2011   PARTIAL HYSTERECTOMY     1994   TUBAL LIGATION     1987   VIDEO BRONCHOSCOPY  08/13/2021   Procedure: VIDEO BRONCHOSCOPY WITHOUT FLUORO;  Surgeon:  Leslye Peer, MD;  Location: [facility] ENDOSCOPY;  Service: Pulmonary;;   VIDEO BRONCHOSCOPY WITH ENDOBRONCHIAL ULTRASOUND N/A 08/13/2021   Procedure: VIDEO BRONCHOSCOPY WITH ENDOBRONCHIAL ULTRASOUND;  Surgeon: Leslye Peer, MD;  Location: [facility] ENDOSCOPY;  Service: Pulmonary;  Laterality: N/A;    REVIEW OF SYSTEMS:   Review of Systems  Constitutional: Negative for appetite change, chills, fatigue, fever and unexpected weight change.  HENT: Negative for mouth sores, nosebleeds, sore throat and trouble swallowing.   Eyes: Negative for eye problems and icterus.  Respiratory: Negative for cough, hemoptysis, shortness of breath and  wheezing.   Cardiovascular: Negative for chest pain and leg swelling.  Gastrointestinal: Negative for abdominal pain, constipation, diarrhea, nausea and vomiting.  Genitourinary: Negative for bladder incontinence, difficulty urinating, dysuria, frequency and hematuria.   Musculoskeletal: Negative for back pain, gait problem, neck pain and neck stiffness.  Skin: Negative for itching and rash.  Neurological: Negative for dizziness, extremity weakness, gait problem, headaches, light-headedness and seizures.  Hematological: Negative for adenopathy. Does not bruise/bleed easily.  Psychiatric/Behavioral: Negative for confusion, depression and sleep disturbance. The patient is not nervous/anxious.     PHYSICAL EXAMINATION:  Blood pressure 106/72, pulse 99, temperature 97.7 F (36.5 C), temperature source Oral, resp. rate 16, weight 113 lb 8 oz (51.5 kg), SpO2 100%.  ECOG PERFORMANCE STATUS: 1  Physical Exam  Constitutional: Oriented to person, place, and time and thin appearing female, and in no distress.  HENT:  Head: Normocephalic and atraumatic.  Mouth/Throat: Oropharynx is clear and moist. No oropharyngeal exudate.  Eyes: Conjunctivae are normal. Right eye exhibits no discharge. Left eye exhibits no discharge. No scleral icterus.  Neck: Normal range of motion. Neck  supple.  Cardiovascular: Normal rate, regular rhythm, normal heart sounds and intact distal pulses.   Pulmonary/Chest: Effort normal and breath sounds normal. No respiratory distress. No wheezes. No rales.  Abdominal: Soft. Bowel sounds are normal. Exhibits no distension and no mass. There is no tenderness.  Musculoskeletal: Normal range of motion. Exhibits no edema.  Lymphadenopathy:    No cervical adenopathy.  Neurological: Alert and oriented to person, place, and time. Exhibits normal muscle tone. Gait normal. Coordination normal.  Skin: Skin is warm and dry. No rash noted. Not diaphoretic. No erythema. No pallor.  Psychiatric: Mood, memory and judgment normal.  Vitals reviewed.  LABORATORY DATA: Lab Results  Component Value Date   WBC 6.7 01/08/2023   HGB 10.7 (L) 01/08/2023   HCT 31.4 (L) 01/08/2023   MCV 101.9 (H) 01/08/2023   PLT 293 01/08/2023      Chemistry      Component Value Date/Time   NA 137 01/08/2023 1047   K 4.5 01/08/2023 1047   CL 105 01/08/2023 1047   CO2 28 01/08/2023 1047   BUN 15 01/08/2023 1047   CREATININE 0.69 01/08/2023 1047      Component Value Date/Time   CALCIUM 8.9 01/08/2023 1047   ALKPHOS 67 01/08/2023 1047   AST 21 01/08/2023 1047   ALT 9 01/08/2023 1047   BILITOT 0.1 (L) 01/08/2023 1047       RADIOGRAPHIC STUDIES:  No results found.   ASSESSMENT/PLAN:  This is a very pleasant 57 years old Caucasian female with Stage IV (T2b, N2, M1 C) non-small cell lung cancer, adenocarcinoma presented with right hilar mass in addition to mediastinal and upper abdominal lymphadenopathy in addition to bilateral adrenal metastases and metastatic disease in the musculature of the lower back.  This was diagnosed in April 2023.   The molecular studies by [employer] showed no actionable mutations and the patient has negative PD-L1 expression. She completed palliative radiotherapy to the right hilar region under the care of Dr. Mitzi Hansen.   She completed  palliative radiation to the right hilar area under the care of Dr. Mitzi Hansen.   She is currently undergoing palliative systemic chemotherapy with carboplatin for an AUC of 5, Alimta 500 mg per metered squared, Keytruda 200 mg IV every 3 weeks.  She is status post 23 cycles.  Her first dose was on 08/29/2021. Starting from cycle #5, she started maintenance alimta  and [place].  Labs reviewed. Recommend that she proceed cycle #24 today scheduled.  We will see her back for follow-up visit in 4 weeks for evaluation repeat blood work before undergoing cycle #25.  We will talk about ordering a repeat CT scan at her next appointment  The patient was advised to call immediately if she has any concerning symptoms in the interval. The patient voices understanding of current disease status and treatment options and is in agreement with the current care plan. All questions were answered. The patient knows to call the clinic with any problems, questions or concerns. We can certainly see the patient much sooner if necessary    No orders of the defined types were placed in this encounter.    The total time spent in the appointment was 20-29 minutes  Teralyn Mullins L Armonie Mettler, PA-C 01/08/23

## 2023-01-08 ENCOUNTER — Inpatient Hospital Stay: Payer: BC Managed Care – PPO

## 2023-01-08 ENCOUNTER — Inpatient Hospital Stay (HOSPITAL_BASED_OUTPATIENT_CLINIC_OR_DEPARTMENT_OTHER): Payer: BC Managed Care – PPO | Admitting: Physician Assistant

## 2023-01-08 ENCOUNTER — Other Ambulatory Visit: Payer: BC Managed Care – PPO

## 2023-01-08 VITALS — BP 106/72 | HR 99 | Temp 97.7°F | Resp 16 | Wt 113.5 lb

## 2023-01-08 DIAGNOSIS — Z5111 Encounter for antineoplastic chemotherapy: Secondary | ICD-10-CM

## 2023-01-08 DIAGNOSIS — C3401 Malignant neoplasm of right main bronchus: Secondary | ICD-10-CM

## 2023-01-08 DIAGNOSIS — Z8541 Personal history of malignant neoplasm of cervix uteri: Secondary | ICD-10-CM | POA: Diagnosis not present

## 2023-01-08 DIAGNOSIS — C3491 Malignant neoplasm of unspecified part of right bronchus or lung: Secondary | ICD-10-CM

## 2023-01-08 DIAGNOSIS — Z5112 Encounter for antineoplastic immunotherapy: Secondary | ICD-10-CM | POA: Diagnosis not present

## 2023-01-08 DIAGNOSIS — C7972 Secondary malignant neoplasm of left adrenal gland: Secondary | ICD-10-CM | POA: Diagnosis not present

## 2023-01-08 DIAGNOSIS — C7971 Secondary malignant neoplasm of right adrenal gland: Secondary | ICD-10-CM | POA: Diagnosis not present

## 2023-01-08 DIAGNOSIS — Z85820 Personal history of malignant melanoma of skin: Secondary | ICD-10-CM | POA: Diagnosis not present

## 2023-01-08 DIAGNOSIS — Z9049 Acquired absence of other specified parts of digestive tract: Secondary | ICD-10-CM | POA: Diagnosis not present

## 2023-01-08 DIAGNOSIS — E538 Deficiency of other specified B group vitamins: Secondary | ICD-10-CM | POA: Diagnosis not present

## 2023-01-08 DIAGNOSIS — Z923 Personal history of irradiation: Secondary | ICD-10-CM | POA: Diagnosis not present

## 2023-01-08 DIAGNOSIS — Z9851 Tubal ligation status: Secondary | ICD-10-CM | POA: Diagnosis not present

## 2023-01-08 LAB — CMP (CANCER CENTER ONLY)
ALT: 9 U/L (ref 0–44)
AST: 21 U/L (ref 15–41)
Albumin: 3.4 g/dL — ABNORMAL LOW (ref 3.5–5.0)
Alkaline Phosphatase: 67 U/L (ref 38–126)
Anion gap: 4 — ABNORMAL LOW (ref 5–15)
BUN: 15 mg/dL (ref 6–20)
CO2: 28 mmol/L (ref 22–32)
Calcium: 8.9 mg/dL (ref 8.9–10.3)
Chloride: 105 mmol/L (ref 98–111)
Creatinine: 0.69 mg/dL (ref 0.44–1.00)
GFR, Estimated: >60 mL/min (ref >60)
Glucose, Bld: 104 mg/dL — ABNORMAL HIGH (ref 70–99)
Potassium: 4.5 mmol/L (ref 3.5–5.1)
Sodium: 137 mmol/L (ref 135–145)
Total Bilirubin: 0.1 mg/dL — ABNORMAL LOW (ref 0.3–1.2)
Total Protein: 6.4 g/dL — ABNORMAL LOW (ref 6.5–8.1)

## 2023-01-08 LAB — CBC WITH DIFFERENTIAL (CANCER CENTER ONLY)
Abs Immature Granulocytes: 0.07 10*3/uL (ref 0.00–0.07)
Basophils Absolute: 0.1 10*3/uL (ref 0.0–0.1)
Basophils Relative: 1 %
Eosinophils Absolute: 0.3 10*3/uL (ref 0.0–0.5)
Eosinophils Relative: 4 %
HCT: 31.4 % — ABNORMAL LOW (ref 36.0–46.0)
Hemoglobin: 10.7 g/dL — ABNORMAL LOW (ref 12.0–15.0)
Immature Granulocytes: 1 %
Lymphocytes Relative: 29 %
Lymphs Abs: 1.9 10*3/uL (ref 0.7–4.0)
MCH: 34.7 pg — ABNORMAL HIGH (ref 26.0–34.0)
MCHC: 34.1 g/dL (ref 30.0–36.0)
MCV: 101.9 fL — ABNORMAL HIGH (ref 80.0–100.0)
Monocytes Absolute: 1 10*3/uL (ref 0.1–1.0)
Monocytes Relative: 16 %
Neutro Abs: 3.3 10*3/uL (ref 1.7–7.7)
Neutrophils Relative %: 49 %
Platelet Count: 293 10*3/uL (ref 150–400)
RBC: 3.08 MIL/uL — ABNORMAL LOW (ref 3.87–5.11)
RDW: 15.4 % (ref 11.5–15.5)
WBC Count: 6.7 10*3/uL (ref 4.0–10.5)
nRBC: 0 % (ref 0.0–0.2)

## 2023-01-08 LAB — TSH: TSH: 0.812 u[IU]/mL (ref 0.350–4.500)

## 2023-01-08 MED ORDER — PROCHLORPERAZINE MALEATE 10 MG PO TABS
10.0000 mg | ORAL_TABLET | Freq: Four times a day (QID) | ORAL | 2 refills | Status: DC | PRN
Start: 2023-01-08 — End: 2024-02-24

## 2023-01-08 MED ORDER — CYANOCOBALAMIN 1000 MCG/ML IJ SOLN
1000.0000 ug | Freq: Once | INTRAMUSCULAR | Status: AC
  Administered 2023-01-08: 1000 ug via INTRAMUSCULAR
  Filled 2023-01-08: qty 1

## 2023-01-08 MED ORDER — SODIUM CHLORIDE 0.9 % IV SOLN
200.0000 mg | Freq: Once | INTRAVENOUS | Status: AC
  Administered 2023-01-08: 200 mg via INTRAVENOUS
  Filled 2023-01-08: qty 200

## 2023-01-08 MED ORDER — PROCHLORPERAZINE MALEATE 10 MG PO TABS
10.0000 mg | ORAL_TABLET | Freq: Once | ORAL | Status: AC
  Administered 2023-01-08: 10 mg via ORAL
  Filled 2023-01-08: qty 1

## 2023-01-08 MED ORDER — SODIUM CHLORIDE 0.9 % IV SOLN: Freq: Once | INTRAVENOUS | Status: AC

## 2023-01-08 MED ORDER — SODIUM CHLORIDE 0.9 % IV SOLN
500.0000 mg/m2 | Freq: Once | INTRAVENOUS | Status: AC
  Administered 2023-01-08: 700 mg via INTRAVENOUS
  Filled 2023-01-08: qty 20

## 2023-01-08 NOTE — Patient Instructions (Signed)
[facility]  Discharge Instructions: Thank you for choosing [facility] to provide your oncology and hematology care.   If you have a lab appointment with the [facility], please go directly to the [facility] and check in at the registration area.   Wear comfortable clothing and clothing appropriate for easy access to any Portacath or PICC line.   We strive to give you quality time with your provider. You may need to reschedule your appointment if you arrive late (15 or more minutes).  Arriving late affects you and other patients whose appointments are after yours.  Also, if you miss three or more appointments without notifying the office, you may be dismissed from the clinic at the provider's discretion.      For prescription refill requests, have your pharmacy contact our office and allow 72 hours for refills to be completed.    Today you received the following chemotherapy and/or immunotherapy agents: Keytruda, Alimta.       To help prevent nausea and vomiting after your treatment, we encourage you to take your nausea medication as directed.  BELOW ARE SYMPTOMS THAT SHOULD BE REPORTED IMMEDIATELY: *FEVER GREATER THAN 100.4 F (38 C) OR HIGHER *CHILLS OR SWEATING *NAUSEA AND VOMITING THAT IS NOT CONTROLLED WITH YOUR NAUSEA MEDICATION *UNUSUAL SHORTNESS OF BREATH *UNUSUAL BRUISING OR BLEEDING *URINARY PROBLEMS (pain or burning when urinating, or frequent urination) *BOWEL PROBLEMS (unusual diarrhea, constipation, pain near the anus) TENDERNESS IN MOUTH AND THROAT WITH OR WITHOUT PRESENCE OF ULCERS (sore throat, sores in mouth, or a toothache) UNUSUAL RASH, SWELLING OR PAIN  UNUSUAL VAGINAL DISCHARGE OR ITCHING   Items with * indicate a potential emergency and should be followed up as soon as possible or go to the Emergency Department if any problems should occur.  Please show the CHEMOTHERAPY ALERT CARD or IMMUNOTHERAPY ALERT CARD  at check-in to the Emergency Department and triage nurse.  Should you have questions after your visit or need to cancel or reschedule your appointment, please contact [facility]  Dept: [phone]  and follow the prompts.  Office hours are 8:00 a.m. to 4:30 p.m. Monday - Friday. Please note that voicemails left after 4:00 p.m. may not be returned until the following business day.  We are closed weekends and major holidays. You have access to a nurse at all times for urgent questions. Please call the main number to the clinic Dept: [phone] and follow the prompts.   For any non-urgent questions, you may also contact your provider using MyChart. We now offer e-Visits for anyone 34 and older to request care online for non-urgent symptoms. For details visit [email or web address].   Also download the MyChart app! Go to the app store, search "MyChart", open the app, select [facility], and log in with your MyChart username and password.

## 2023-01-09 LAB — T4: T4, Total: 8 ug/dL (ref 4.5–12.0)

## 2023-01-14 ENCOUNTER — Telehealth: Payer: Self-pay | Admitting: Internal Medicine

## 2023-01-14 NOTE — Telephone Encounter (Signed)
Called patient regarding September appointments, patient is notified.

## 2023-01-23 NOTE — Progress Notes (Signed)
[facility] OFFICE PROGRESS NOTE  April Mangle, FNP [address] [place] [address]  DIAGNOSIS: Stage IV (T2b, N2, M1 C) non-small cell lung cancer, adenocarcinoma presented with right hilar mass in addition to mediastinal and upper abdominal lymphadenopathy in addition to bilateral adrenal metastases and metastatic disease in the musculature of the lower back.  This was diagnosed in April 2023.   DETECTED ALTERATION(S) / BIOMARKER(S)     % CFDNA OR AMPLIFICATION        ASSOCIATED FDA-APPROVED THERAPIES         CLINICAL TRIAL AVAILABILITY [id number] 0.5% None     Yes [id number] (Splice Site Indel) 0.6% None     Yes   PD-L1 expression 0%  PRIOR THERAPY: Palliative radiotherapy to the right hilar region under the care of Dr. Mitzi Hansen   CURRENT THERAPY: Systemic chemotherapy with carboplatin for AUC of 5, Alimta 500 Mg/M2 and Keytruda 200 Mg IV every 3 weeks.  First dose August 29, 2021. Status post 24 cycles.  Starting from cycle #5, the patient started maintenance Alimta and Keytruda.    INTERVAL HISTORY: April Walsh 57 y.o. Walsh returns to the clinic today for a follow-up visit.  The patient was last seen by myself 3 weeks ago.  The patient typically undergoes treatment with Alimta and Keytruda and she tolerates it well without any concerning adverse side effects.   Regarding her chemotherapy and immunotherapy she denies any fever, chills, or night sweats.  Her weight is fairly stable. Denies any changes in her baseline  baseline dyspnea on exertion. Denies significant cough. She denies any chest pain or hemoptysis. Denies any headache or visual changes. She denies nausea, diarrhea, vomiting, or constipation. She denies any rashes or skin changes. She is here today for evaluation before starting cycle #25.   MEDICAL HISTORY: Past Medical History:  Diagnosis Date   Anemia    Asthma    Bronchitis    Cancer (HCC)    basal cell melanoma, cervical  cancer   COPD (chronic obstructive pulmonary disease) (HCC)    "early stages of COPD"   Headache    Lung cancer (HCC) 08/13/2021    ALLERGIES:  has No Known Allergies.  MEDICATIONS:  Current Outpatient Medications  Medication Sig Dispense Refill   apixaban (ELIQUIS) 2.5 MG TABS tablet Take 1 tablet (2.5 mg total) by mouth 2 (two) times daily. 60 tablet 0   atorvastatin (LIPITOR) 80 MG tablet TAKE 1 TABLET BY MOUTH EVERY DAY 30 tablet 3   BREZTRI AEROSPHERE 160-9-4.8 MCG/ACT AERO INHALE 2 PUFFS INTO THE LUNGS IN THE MORNING AND AT BEDTIME. 10.7 each 11   folic acid (FOLVITE) 1 MG tablet TAKE 1 TABLET BY MOUTH EVERY DAY 30 tablet 2   HYDROcodone-acetaminophen (NORCO) 5-325 MG tablet Take 1 tablet by mouth every 6 (six) hours as needed for moderate pain. 15 tablet 0   lidocaine-prilocaine (EMLA) cream Apply 1 Application topically as needed. 30 g 2   prochlorperazine (COMPAZINE) 10 MG tablet Take 1 tablet (10 mg total) by mouth every 6 (six) hours as needed. 30 tablet 2   No current facility-administered medications for this visit.   Facility-Administered Medications Ordered in Other Visits  Medication Dose Route Frequency Provider Last Rate Last Admin   cyanocobalamin (VITAMIN B12) 1000 MCG/ML injection            prochlorperazine (COMPAZINE) 10 MG tablet             SURGICAL HISTORY:  Past  Surgical History:  Procedure Laterality Date   APPENDECTOMY     1996   BREAST BIOPSY Right    2010-2015   BRONCHIAL BIOPSY  08/13/2021   Procedure: BRONCHIAL BIOPSIES;  Surgeon: Leslye Peer, MD;  Location: [facility] ENDOSCOPY;  Service: Pulmonary;;   BRONCHIAL BRUSHINGS  08/13/2021   Procedure: BRONCHIAL BRUSHINGS;  Surgeon: Leslye Peer, MD;  Location: [facility] ENDOSCOPY;  Service: Pulmonary;;   BRONCHIAL NEEDLE ASPIRATION BIOPSY  08/13/2021   Procedure: BRONCHIAL NEEDLE ASPIRATION BIOPSIES;  Surgeon: Leslye Peer, MD;  Location: [facility] ENDOSCOPY;  Service: Pulmonary;;   CESAREAN SECTION     1984, 1987    FOOT SURGERY Right    2 pins and screw 1999   FOOT SURGERY Left 06/21/2021   screw and 2 pins   HEMOSTASIS CONTROL  08/13/2021   Procedure: HEMOSTASIS CONTROL;  Surgeon: Leslye Peer, MD;  Location: [facility] ENDOSCOPY;  Service: Pulmonary;;   IR IMAGING GUIDED PORT INSERTION  11/26/2021   MELANOMA EXCISION Left    basal cell melanoma 2011   PARTIAL HYSTERECTOMY     1994   TUBAL LIGATION     1987   VIDEO BRONCHOSCOPY  08/13/2021   Procedure: VIDEO BRONCHOSCOPY WITHOUT FLUORO;  Surgeon: Leslye Peer, MD;  Location: [facility] ENDOSCOPY;  Service: Pulmonary;;   VIDEO BRONCHOSCOPY WITH ENDOBRONCHIAL ULTRASOUND N/A 08/13/2021   Procedure: VIDEO BRONCHOSCOPY WITH ENDOBRONCHIAL ULTRASOUND;  Surgeon: Leslye Peer, MD;  Location: [facility] ENDOSCOPY;  Service: Pulmonary;  Laterality: N/A;    REVIEW OF SYSTEMS:   Constitutional: Negative for appetite change, chills, fatigue, fever and unexpected weight change.  HENT: Negative for mouth sores, nosebleeds, sore throat and trouble swallowing.   Eyes: Negative for eye problems and icterus.  Respiratory: Negative for cough, hemoptysis, shortness of breath and wheezing.   Cardiovascular: Negative for chest pain and leg swelling.  Gastrointestinal: Negative for abdominal pain, constipation, diarrhea, nausea and vomiting.  Genitourinary: Negative for bladder incontinence, difficulty urinating, dysuria, frequency and hematuria.   Musculoskeletal: Negative for back pain, gait problem, neck pain and neck stiffness.  Skin: Negative for itching and rash.  Neurological: Negative for dizziness, extremity weakness, gait problem, headaches, light-headedness and seizures.  Hematological: Negative for adenopathy. Does not bruise/bleed easily.  Psychiatric/Behavioral: Negative for confusion, depression and sleep disturbance. The patient is not nervous/anxious.      PHYSICAL EXAMINATION:  Blood pressure 120/70, pulse 90, temperature 98.1 F (36.7 C), temperature source Oral,  resp. rate 17, weight 114 lb 14.4 oz (52.1 kg), SpO2 99%.  ECOG PERFORMANCE STATUS: 1  Physical Exam  Constitutional: Oriented to person, place, and time and thin appearing Walsh, and in no distress.  HENT:  Head: Normocephalic and atraumatic.  Mouth/Throat: Oropharynx is clear and moist. No oropharyngeal exudate.  Eyes: Conjunctivae are normal. Right eye exhibits no discharge. Left eye exhibits no discharge. No scleral icterus.  Neck: Normal range of motion. Neck supple.  Cardiovascular: Normal rate, regular rhythm, normal heart sounds and intact distal pulses.   Pulmonary/Chest: Effort normal and breath sounds normal. No respiratory distress. No wheezes. No rales.  Abdominal: Soft. Bowel sounds are normal. Exhibits no distension and no mass. There is no tenderness.  Musculoskeletal: Normal range of motion. Exhibits no edema.  Lymphadenopathy:    No cervical adenopathy.  Neurological: Alert and oriented to person, place, and time. Exhibits normal muscle tone. Gait normal. Coordination normal.  Skin: Skin is warm and dry. No rash noted. Not diaphoretic. No erythema. No pallor.  Psychiatric: Mood,  memory and judgment normal.  Vitals reviewed.  LABORATORY DATA: Lab Results  Component Value Date   WBC 5.6 01/29/2023   HGB 10.5 (L) 01/29/2023   HCT 32.1 (L) 01/29/2023   MCV 105.6 (H) 01/29/2023   PLT 289 01/29/2023      Chemistry      Component Value Date/Time   NA 137 01/08/2023 1047   K 4.5 01/08/2023 1047   CL 105 01/08/2023 1047   CO2 28 01/08/2023 1047   BUN 15 01/08/2023 1047   CREATININE 0.69 01/08/2023 1047      Component Value Date/Time   CALCIUM 8.9 01/08/2023 1047   ALKPHOS 67 01/08/2023 1047   AST 21 01/08/2023 1047   ALT 9 01/08/2023 1047   BILITOT 0.1 (L) 01/08/2023 1047       RADIOGRAPHIC STUDIES:  No results found.   ASSESSMENT/PLAN:  This is a very pleasant 57 years old Caucasian Walsh with Stage IV (T2b, N2, M1 C) non-small cell lung cancer,  adenocarcinoma presented with right hilar mass in addition to mediastinal and upper abdominal lymphadenopathy in addition to bilateral adrenal metastases and metastatic disease in the musculature of the lower back.  This was diagnosed in April 2023.   The molecular studies by [employer] showed no actionable mutations and the patient has negative PD-L1 expression. She completed palliative radiotherapy to the right hilar region under the care of Dr. Mitzi Hansen.   She completed palliative radiation to the right hilar area under the care of Dr. Mitzi Hansen.   She is currently undergoing palliative systemic chemotherapy with carboplatin for an AUC of 5, Alimta 500 mg per metered squared, Keytruda 200 mg IV every 3 weeks.  She is status post 24 cycles.  Her first dose was on 08/29/2021. Starting from cycle #5, she started maintenance alimta and [place].   Labs reviewed. Her CMP is pending. As long as this is within parameters, recommend that she proceed cycle #25 today as scheduled.  We will see her back for follow-up visit in 4 weeks for evaluation repeat blood work before undergoing cycle #26.   I will arrange for a restaging CT scan prior to her next cycle of treatment.   The patient was advised to call immediately if she has any concerning symptoms in the interval. The patient voices understanding of current disease status and treatment options and is in agreement with the current care plan. All questions were answered. The patient knows to call the clinic with any problems, questions or concerns. We can certainly see the patient much sooner if necessary      Orders Placed This Encounter  Procedures   CT CHEST ABDOMEN PELVIS W CONTRAST    Standing Status:   Future    Standing Expiration Date:   01/29/2024    Order Specific Question:   If indicated for the ordered procedure, I authorize the administration of contrast media per Radiology protocol    Answer:   Yes    Order Specific Question:   Does the  patient have a contrast media/X-ray dye allergy?    Answer:   No    Order Specific Question:   Preferred imaging location?    Answer:   [facility]    Order Specific Question:   If indicated for the ordered procedure, I authorize the administration of oral contrast media per Radiology protocol    Answer:   Yes     The total time spent in the appointment was 20-29 minutes  Zorion Nims L Amita Atayde,  PA-C 01/29/23

## 2023-01-29 ENCOUNTER — Inpatient Hospital Stay: Payer: BC Managed Care – PPO

## 2023-01-29 ENCOUNTER — Other Ambulatory Visit: Payer: Self-pay | Admitting: Physician Assistant

## 2023-01-29 ENCOUNTER — Inpatient Hospital Stay: Payer: BC Managed Care – PPO | Attending: Radiation Oncology

## 2023-01-29 ENCOUNTER — Inpatient Hospital Stay: Payer: BC Managed Care – PPO | Admitting: Physician Assistant

## 2023-01-29 VITALS — BP 120/70 | HR 90 | Temp 98.1°F | Resp 17 | Wt 114.9 lb

## 2023-01-29 DIAGNOSIS — C3401 Malignant neoplasm of right main bronchus: Secondary | ICD-10-CM

## 2023-01-29 DIAGNOSIS — Z5112 Encounter for antineoplastic immunotherapy: Secondary | ICD-10-CM

## 2023-01-29 DIAGNOSIS — Z923 Personal history of irradiation: Secondary | ICD-10-CM | POA: Insufficient documentation

## 2023-01-29 DIAGNOSIS — Z5111 Encounter for antineoplastic chemotherapy: Secondary | ICD-10-CM

## 2023-01-29 DIAGNOSIS — C7971 Secondary malignant neoplasm of right adrenal gland: Secondary | ICD-10-CM | POA: Insufficient documentation

## 2023-01-29 DIAGNOSIS — Z85820 Personal history of malignant melanoma of skin: Secondary | ICD-10-CM | POA: Diagnosis not present

## 2023-01-29 DIAGNOSIS — Z9851 Tubal ligation status: Secondary | ICD-10-CM | POA: Insufficient documentation

## 2023-01-29 DIAGNOSIS — C3491 Malignant neoplasm of unspecified part of right bronchus or lung: Secondary | ICD-10-CM

## 2023-01-29 DIAGNOSIS — Z9049 Acquired absence of other specified parts of digestive tract: Secondary | ICD-10-CM | POA: Insufficient documentation

## 2023-01-29 DIAGNOSIS — C7972 Secondary malignant neoplasm of left adrenal gland: Secondary | ICD-10-CM | POA: Insufficient documentation

## 2023-01-29 DIAGNOSIS — Z8541 Personal history of malignant neoplasm of cervix uteri: Secondary | ICD-10-CM | POA: Insufficient documentation

## 2023-01-29 DIAGNOSIS — Z95828 Presence of other vascular implants and grafts: Secondary | ICD-10-CM

## 2023-01-29 LAB — CBC WITH DIFFERENTIAL (CANCER CENTER ONLY)
Abs Immature Granulocytes: 0.03 10*3/uL (ref 0.00–0.07)
Basophils Absolute: 0 10*3/uL (ref 0.0–0.1)
Basophils Relative: 1 %
Eosinophils Absolute: 0.2 10*3/uL (ref 0.0–0.5)
Eosinophils Relative: 3 %
HCT: 32.1 % — ABNORMAL LOW (ref 36.0–46.0)
Hemoglobin: 10.5 g/dL — ABNORMAL LOW (ref 12.0–15.0)
Immature Granulocytes: 1 %
Lymphocytes Relative: 30 %
Lymphs Abs: 1.7 10*3/uL (ref 0.7–4.0)
MCH: 34.5 pg — ABNORMAL HIGH (ref 26.0–34.0)
MCHC: 32.7 g/dL (ref 30.0–36.0)
MCV: 105.6 fL — ABNORMAL HIGH (ref 80.0–100.0)
Monocytes Absolute: 0.9 10*3/uL (ref 0.1–1.0)
Monocytes Relative: 17 %
Neutro Abs: 2.7 10*3/uL (ref 1.7–7.7)
Neutrophils Relative %: 48 %
Platelet Count: 289 10*3/uL (ref 150–400)
RBC: 3.04 MIL/uL — ABNORMAL LOW (ref 3.87–5.11)
RDW: 16.8 % — ABNORMAL HIGH (ref 11.5–15.5)
Smear Review: NORMAL
WBC Count: 5.6 10*3/uL (ref 4.0–10.5)
nRBC: 0 % (ref 0.0–0.2)

## 2023-01-29 LAB — CMP (CANCER CENTER ONLY)
ALT: 8 U/L (ref 0–44)
AST: 20 U/L (ref 15–41)
Albumin: 3.3 g/dL — ABNORMAL LOW (ref 3.5–5.0)
Alkaline Phosphatase: 65 U/L (ref 38–126)
Anion gap: 5 (ref 5–15)
BUN: 13 mg/dL (ref 6–20)
CO2: 25 mmol/L (ref 22–32)
Calcium: 8.8 mg/dL — ABNORMAL LOW (ref 8.9–10.3)
Chloride: 109 mmol/L (ref 98–111)
Creatinine: 0.8 mg/dL (ref 0.44–1.00)
GFR, Estimated: >60 mL/min (ref >60)
Glucose, Bld: 101 mg/dL — ABNORMAL HIGH (ref 70–99)
Potassium: 3.9 mmol/L (ref 3.5–5.1)
Sodium: 139 mmol/L (ref 135–145)
Total Bilirubin: 0.2 mg/dL — ABNORMAL LOW (ref 0.3–1.2)
Total Protein: 6.2 g/dL — ABNORMAL LOW (ref 6.5–8.1)

## 2023-01-29 MED ORDER — SODIUM CHLORIDE 0.9% FLUSH
10.0000 mL | Freq: Once | INTRAVENOUS | Status: AC
  Administered 2023-01-29: 10 mL

## 2023-01-29 MED ORDER — SODIUM CHLORIDE 0.9 % IV SOLN
200.0000 mg | Freq: Once | INTRAVENOUS | Status: AC
  Administered 2023-01-29: 200 mg via INTRAVENOUS
  Filled 2023-01-29: qty 200

## 2023-01-29 MED ORDER — PROCHLORPERAZINE MALEATE 10 MG PO TABS
10.0000 mg | ORAL_TABLET | Freq: Once | ORAL | Status: AC
  Administered 2023-01-29: 10 mg via ORAL

## 2023-01-29 MED ORDER — SODIUM CHLORIDE 0.9 % IV SOLN: Freq: Once | INTRAVENOUS | Status: AC

## 2023-01-29 MED ORDER — SODIUM CHLORIDE 0.9 % IV SOLN
500.0000 mg/m2 | Freq: Once | INTRAVENOUS | Status: AC
  Administered 2023-01-29: 700 mg via INTRAVENOUS
  Filled 2023-01-29: qty 20

## 2023-01-29 NOTE — Progress Notes (Signed)
Per Provider, OK to treat without ANC results today.

## 2023-02-03 ENCOUNTER — Telehealth: Payer: Self-pay | Admitting: Internal Medicine

## 2023-02-03 NOTE — Telephone Encounter (Signed)
Called patient regarding October appointments, patient has been called and notified.

## 2023-02-11 ENCOUNTER — Encounter (HOSPITAL_COMMUNITY): Payer: Self-pay

## 2023-02-11 ENCOUNTER — Ambulatory Visit (HOSPITAL_COMMUNITY)
Admission: RE | Admit: 2023-02-11 | Discharge: 2023-02-11 | Disposition: A | Discharge status: Home / Self Care | Payer: BC Managed Care – PPO | Source: Ambulatory Visit | Attending: Physician Assistant | Admitting: Physician Assistant

## 2023-02-11 DIAGNOSIS — C3491 Malignant neoplasm of unspecified part of right bronchus or lung: Secondary | ICD-10-CM | POA: Diagnosis not present

## 2023-02-11 DIAGNOSIS — K5730 Diverticulosis of large intestine without perforation or abscess without bleeding: Secondary | ICD-10-CM | POA: Diagnosis not present

## 2023-02-11 DIAGNOSIS — J90 Pleural effusion, not elsewhere classified: Secondary | ICD-10-CM | POA: Diagnosis not present

## 2023-02-11 DIAGNOSIS — I700 Atherosclerosis of aorta: Secondary | ICD-10-CM | POA: Diagnosis not present

## 2023-02-11 MED ORDER — SODIUM CHLORIDE (PF) 0.9 % IJ SOLN
INTRAMUSCULAR | Status: AC
  Filled 2023-02-11: qty 50

## 2023-02-11 MED ORDER — HEPARIN SOD (PORK) LOCK FLUSH 100 UNIT/ML IV SOLN
INTRAVENOUS | Status: AC
  Filled 2023-02-11: qty 5

## 2023-02-11 MED ORDER — IOHEXOL 300 MG/ML  SOLN
100.0000 mL | Freq: Once | INTRAMUSCULAR | Status: AC | PRN
  Administered 2023-02-11: 100 mL via INTRAVENOUS

## 2023-02-11 MED ORDER — IOHEXOL 9 MG/ML PO SOLN
1000.0000 mL | ORAL | Status: AC
  Administered 2023-02-11: 1000 mL via ORAL

## 2023-02-11 MED ORDER — HEPARIN SOD (PORK) LOCK FLUSH 100 UNIT/ML IV SOLN
500.0000 [IU] | Freq: Once | INTRAVENOUS | Status: AC
  Administered 2023-02-11: 500 [IU] via INTRAVENOUS

## 2023-02-19 ENCOUNTER — Ambulatory Visit: Payer: BC Managed Care – PPO | Admitting: Physician Assistant

## 2023-02-19 ENCOUNTER — Ambulatory Visit: Payer: BC Managed Care – PPO

## 2023-02-19 ENCOUNTER — Other Ambulatory Visit: Payer: BC Managed Care – PPO

## 2023-02-20 ENCOUNTER — Inpatient Hospital Stay: Payer: BC Managed Care – PPO | Admitting: Internal Medicine

## 2023-02-20 ENCOUNTER — Inpatient Hospital Stay: Payer: BC Managed Care – PPO | Attending: Radiation Oncology

## 2023-02-20 ENCOUNTER — Inpatient Hospital Stay: Payer: BC Managed Care – PPO

## 2023-02-20 VITALS — Wt 115.6 lb

## 2023-02-20 DIAGNOSIS — C7971 Secondary malignant neoplasm of right adrenal gland: Secondary | ICD-10-CM | POA: Diagnosis not present

## 2023-02-20 DIAGNOSIS — Z8541 Personal history of malignant neoplasm of cervix uteri: Secondary | ICD-10-CM | POA: Diagnosis not present

## 2023-02-20 DIAGNOSIS — C3491 Malignant neoplasm of unspecified part of right bronchus or lung: Secondary | ICD-10-CM

## 2023-02-20 DIAGNOSIS — Z9851 Tubal ligation status: Secondary | ICD-10-CM | POA: Diagnosis not present

## 2023-02-20 DIAGNOSIS — Z923 Personal history of irradiation: Secondary | ICD-10-CM | POA: Diagnosis not present

## 2023-02-20 DIAGNOSIS — Z5112 Encounter for antineoplastic immunotherapy: Secondary | ICD-10-CM | POA: Insufficient documentation

## 2023-02-20 DIAGNOSIS — Z5111 Encounter for antineoplastic chemotherapy: Secondary | ICD-10-CM | POA: Diagnosis not present

## 2023-02-20 DIAGNOSIS — R600 Localized edema: Secondary | ICD-10-CM | POA: Diagnosis not present

## 2023-02-20 DIAGNOSIS — C7972 Secondary malignant neoplasm of left adrenal gland: Secondary | ICD-10-CM | POA: Diagnosis not present

## 2023-02-20 DIAGNOSIS — Z85820 Personal history of malignant melanoma of skin: Secondary | ICD-10-CM | POA: Insufficient documentation

## 2023-02-20 DIAGNOSIS — Z9049 Acquired absence of other specified parts of digestive tract: Secondary | ICD-10-CM | POA: Insufficient documentation

## 2023-02-20 LAB — CBC WITH DIFFERENTIAL (CANCER CENTER ONLY)
Abs Immature Granulocytes: 0.09 10*3/uL — ABNORMAL HIGH (ref 0.00–0.07)
Basophils Absolute: 0.1 10*3/uL (ref 0.0–0.1)
Basophils Relative: 1 %
Eosinophils Absolute: 0.1 10*3/uL (ref 0.0–0.5)
Eosinophils Relative: 2 %
HCT: 31.3 % — ABNORMAL LOW (ref 36.0–46.0)
Hemoglobin: 10.2 g/dL — ABNORMAL LOW (ref 12.0–15.0)
Immature Granulocytes: 2 %
Lymphocytes Relative: 21 %
Lymphs Abs: 1.3 10*3/uL (ref 0.7–4.0)
MCH: 34.8 pg — ABNORMAL HIGH (ref 26.0–34.0)
MCHC: 32.6 g/dL (ref 30.0–36.0)
MCV: 106.8 fL — ABNORMAL HIGH (ref 80.0–100.0)
Monocytes Absolute: 1.1 10*3/uL — ABNORMAL HIGH (ref 0.1–1.0)
Monocytes Relative: 18 %
Neutro Abs: 3.4 10*3/uL (ref 1.7–7.7)
Neutrophils Relative %: 56 %
Platelet Count: 355 10*3/uL (ref 150–400)
RBC: 2.93 MIL/uL — ABNORMAL LOW (ref 3.87–5.11)
RDW: 16.9 % — ABNORMAL HIGH (ref 11.5–15.5)
WBC Count: 6.1 10*3/uL (ref 4.0–10.5)
nRBC: 0 % (ref 0.0–0.2)

## 2023-02-20 LAB — CMP (CANCER CENTER ONLY)
ALT: 7 U/L (ref 0–44)
AST: 18 U/L (ref 15–41)
Albumin: 3.2 g/dL — ABNORMAL LOW (ref 3.5–5.0)
Alkaline Phosphatase: 62 U/L (ref 38–126)
Anion gap: 5 (ref 5–15)
BUN: 12 mg/dL (ref 6–20)
CO2: 27 mmol/L (ref 22–32)
Calcium: 9.2 mg/dL (ref 8.9–10.3)
Chloride: 106 mmol/L (ref 98–111)
Creatinine: 0.7 mg/dL (ref 0.44–1.00)
GFR, Estimated: >60 mL/min (ref >60)
Glucose, Bld: 80 mg/dL (ref 70–99)
Potassium: 4.3 mmol/L (ref 3.5–5.1)
Sodium: 138 mmol/L (ref 135–145)
Total Bilirubin: 0.2 mg/dL — ABNORMAL LOW (ref 0.3–1.2)
Total Protein: 6.4 g/dL — ABNORMAL LOW (ref 6.5–8.1)

## 2023-02-20 MED ORDER — HEPARIN SOD (PORK) LOCK FLUSH 100 UNIT/ML IV SOLN
500.0000 [IU] | Freq: Once | INTRAVENOUS | Status: AC | PRN
  Administered 2023-02-20: 500 [IU]

## 2023-02-20 MED ORDER — SODIUM CHLORIDE 0.9 % IV SOLN
200.0000 mg | Freq: Once | INTRAVENOUS | Status: AC
  Administered 2023-02-20: 200 mg via INTRAVENOUS
  Filled 2023-02-20: qty 200

## 2023-02-20 MED ORDER — SODIUM CHLORIDE 0.9 % IV SOLN
500.0000 mg/m2 | Freq: Once | INTRAVENOUS | Status: AC
  Administered 2023-02-20: 700 mg via INTRAVENOUS
  Filled 2023-02-20: qty 20

## 2023-02-20 MED ORDER — SODIUM CHLORIDE 0.9% FLUSH
10.0000 mL | INTRAVENOUS | Status: DC | PRN
  Administered 2023-02-20: 10 mL

## 2023-02-20 MED ORDER — SODIUM CHLORIDE 0.9 % IV SOLN: Freq: Once | INTRAVENOUS | Status: AC

## 2023-02-20 MED ORDER — PROCHLORPERAZINE MALEATE 10 MG PO TABS
10.0000 mg | ORAL_TABLET | Freq: Once | ORAL | Status: AC
  Administered 2023-02-20: 10 mg via ORAL
  Filled 2023-02-20: qty 1

## 2023-02-20 NOTE — Progress Notes (Signed)
[facility] [phone]   [phone]  OFFICE PROGRESS NOTE  [name], FNP [address] [place] [address]  DIAGNOSIS: Stage IV (T2b, N2, M1 C) non-small cell lung cancer, adenocarcinoma presented with right hilar mass in addition to mediastinal and upper abdominal lymphadenopathy in addition to bilateral adrenal metastases and metastatic disease in the musculature of the lower back.  This was diagnosed in April 2023.  DETECTED ALTERATION(S) / BIOMARKER(S) % CFDNA OR AMPLIFICATION ASSOCIATED FDA-APPROVED THERAPIES CLINICAL TRIAL AVAILABILITY [id number] 0.5% None  Yes [id number] (Splice Site Indel) 0.6% None  Yes  PD-L1 expression 0%  PRIOR THERAPY: Palliative radiotherapy to the right hilar region under the care of Dr. Mitzi Hansen.  CURRENT THERAPY:  systemic chemotherapy with carboplatin for AUC of 5, Alimta 500 Mg/M2 and Keytruda 200 Mg IV every 3 weeks.  First dose August 29, 2021.  Status post 25 cycles.  Starting from cycle #5 the patient is on maintenance treatment with Alimta and Keytruda every 3 weeks.  INTERVAL HISTORY: April Walsh 57 y.o. female returns to the clinic today for follow-up visit accompanied by her aunt.  Discussed the use of AI scribe software for clinical note transcription with the patient, who gave verbal consent to proceed.  History of Present Illness   The patient, a 57 year old individual diagnosed with stage four non-small cell lung cancer adenocarcinoma in April 2023, has been on a regimen of Alimta and Keytruda since the initial four cycles of chemotherapy. She has completed a total of 25 cycles to date.  In the three weeks since her last consultation, the patient reports feeling well overall, with the exception of slight bilateral foot swelling. She attributes this to potential excessive salt intake and has been elevating her legs at the end of the day to manage the swelling.  The patient  denies experiencing any chest pain, breathing issues, cough, hemoptysis, nausea, vomiting, diarrhea, weight loss, night sweats, or headaches. There have been no changes in her vision.  The patient has not experienced any shortness of breath or other symptoms related to the fluid accumulation.      MEDICAL HISTORY: Past Medical History:  Diagnosis Date   Anemia    Asthma    Bronchitis    Cancer (HCC)    basal cell melanoma, cervical cancer   COPD (chronic obstructive pulmonary disease) (HCC)    "early stages of COPD"   Headache    Lung cancer (HCC) 08/13/2021    ALLERGIES:  has No Known Allergies.  MEDICATIONS:  Current Outpatient Medications  Medication Sig Dispense Refill   apixaban (ELIQUIS) 2.5 MG TABS tablet Take 1 tablet (2.5 mg total) by mouth 2 (two) times daily. 60 tablet 0   atorvastatin (LIPITOR) 80 MG tablet TAKE 1 TABLET BY MOUTH EVERY DAY 30 tablet 3   BREZTRI AEROSPHERE 160-9-4.8 MCG/ACT AERO INHALE 2 PUFFS INTO THE LUNGS IN THE MORNING AND AT BEDTIME. 10.7 each 11   folic acid (FOLVITE) 1 MG tablet TAKE 1 TABLET BY MOUTH EVERY DAY 30 tablet 2   HYDROcodone-acetaminophen (NORCO) 5-325 MG tablet Take 1 tablet by mouth every 6 (six) hours as needed for moderate pain. 15 tablet 0   lidocaine-prilocaine (EMLA) cream Apply 1 Application topically as needed. 30 g 2   prochlorperazine (COMPAZINE) 10 MG tablet Take 1 tablet (10 mg total) by mouth every 6 (six) hours as needed. 30 tablet 2   No current facility-administered medications for this visit.  Facility-Administered Medications Ordered in Other Visits  Medication Dose Route Frequency Provider Last Rate Last Admin   cyanocobalamin (VITAMIN B12) 1000 MCG/ML injection            prochlorperazine (COMPAZINE) 10 MG tablet             SURGICAL HISTORY:  Past Surgical History:  Procedure Laterality Date   APPENDECTOMY     1996   BREAST BIOPSY Right    2010-2015   BRONCHIAL BIOPSY  08/13/2021   Procedure: BRONCHIAL  BIOPSIES;  Surgeon: Leslye Peer, MD;  Location: [facility] ENDOSCOPY;  Service: Pulmonary;;   BRONCHIAL BRUSHINGS  08/13/2021   Procedure: BRONCHIAL BRUSHINGS;  Surgeon: Leslye Peer, MD;  Location: [facility] ENDOSCOPY;  Service: Pulmonary;;   BRONCHIAL NEEDLE ASPIRATION BIOPSY  08/13/2021   Procedure: BRONCHIAL NEEDLE ASPIRATION BIOPSIES;  Surgeon: Leslye Peer, MD;  Location: [facility] ENDOSCOPY;  Service: Pulmonary;;   CESAREAN SECTION     1984, 1987   FOOT SURGERY Right    2 pins and screw 1999   FOOT SURGERY Left 06/21/2021   screw and 2 pins   HEMOSTASIS CONTROL  08/13/2021   Procedure: HEMOSTASIS CONTROL;  Surgeon: Leslye Peer, MD;  Location: [facility] ENDOSCOPY;  Service: Pulmonary;;   IR IMAGING GUIDED PORT INSERTION  11/26/2021   MELANOMA EXCISION Left    basal cell melanoma 2011   PARTIAL HYSTERECTOMY     1994   TUBAL LIGATION     1987   VIDEO BRONCHOSCOPY  08/13/2021   Procedure: VIDEO BRONCHOSCOPY WITHOUT FLUORO;  Surgeon: Leslye Peer, MD;  Location[facility] ENDOSCOPY;  Service: Pulmonary;;   VIDEO BRONCHOSCOPY WITH ENDOBRONCHIAL ULTRASOUND N/A 08/13/2021   Procedure: VIDEO BRONCHOSCOPY WITH ENDOBRONCHIAL ULTRASOUND;  Surgeon: Leslye Peer, MD;  Location: [facility] ENDOSCOPY;  Service: Pulmonary;  Laterality: N/A;    REVIEW OF SYSTEMS:  Constitutional: negative Eyes: negative Ears, nose, mouth, throat, and face: negative Respiratory: negative Cardiovascular: negative Gastrointestinal: negative Genitourinary:negative Integument/breast: negative Hematologic/lymphatic: negative Musculoskeletal:negative Neurological: negative Behavioral/Psych: negative Endocrine: negative Allergic/Immunologic: negative   PHYSICAL EXAMINATION: General appearance: alert, cooperative, and no distress Head: Normocephalic, without obvious abnormality, atraumatic Neck: no adenopathy, no JVD, supple, symmetrical, trachea midline, and thyroid not enlarged, symmetric, no tenderness/mass/nodules Lymph nodes: Cervical,  supraclavicular, and axillary nodes normal. Resp: clear to auscultation bilaterally Back: symmetric, no curvature. ROM normal. No CVA tenderness. Cardio: regular rate and rhythm, S1, S2 normal, no murmur, click, rub or gallop GI: soft, non-tender; bowel sounds normal; no masses,  no organomegaly Extremities: extremities normal, atraumatic, no cyanosis or edema Neurologic: Alert and oriented X 3, normal strength and tone. Normal symmetric reflexes. Normal coordination and gait  ECOG PERFORMANCE STATUS: 1 - Symptomatic but completely ambulatory  Blood pressure 124/82, pulse 93, temperature 98.2 F (36.8 C), temperature source Oral, resp. rate 17, SpO2 100%.  LABORATORY DATA: Lab Results  Component Value Date   WBC 6.1 02/20/2023   HGB 10.2 (L) 02/20/2023   HCT 31.3 (L) 02/20/2023   MCV 106.8 (H) 02/20/2023   PLT 355 02/20/2023      Chemistry      Component Value Date/Time   NA 139 01/29/2023 1017   K 3.9 01/29/2023 1017   CL 109 01/29/2023 1017   CO2 25 01/29/2023 1017   BUN 13 01/29/2023 1017   CREATININE 0.80 01/29/2023 1017      Component Value Date/Time   CALCIUM 8.8 (L) 01/29/2023 1017   ALKPHOS 65 01/29/2023 1017   AST 20 01/29/2023 1017  ALT 8 01/29/2023 1017   BILITOT 0.2 (L) 01/29/2023 1017       RADIOGRAPHIC STUDIES: CT CHEST ABDOMEN PELVIS W CONTRAST  Result Date: 02/19/2023 CLINICAL DATA:  Stage IV lung cancer. Restaging post completion of radiation therapy. Chemotherapy and immunotherapy ongoing. * Tracking Code: BO * EXAM: CT CHEST, ABDOMEN, AND PELVIS WITH CONTRAST TECHNIQUE: Multidetector CT imaging of the chest, abdomen and pelvis was performed following the standard protocol during bolus administration of intravenous contrast. RADIATION DOSE REDUCTION: This exam was performed according to the departmental dose-optimization program which includes automated exposure control, adjustment of the mA and/or kV according to patient size and/or use of iterative  reconstruction technique. CONTRAST:  OMNIPAQUE IOHEXOL 300 MG/ML  SOLN COMPARISON:  Prior CTs 11/26/2022 and 09/24/2022. FINDINGS: CT CHEST FINDINGS Cardiovascular: No acute vascular findings. Right IJ Port-A-Cath extends to the superior cavoatrial junction. Mild atherosclerosis of the aorta and great vessels. The heart size is normal. There is no pericardial effusion. Mediastinum/Nodes: There are no enlarged mediastinal, hilar or axillary lymph nodes. Stable mild soft tissue thickening around the right hilum. The thyroid gland, trachea and esophagus demonstrate no significant findings. Lungs/Pleura: A moderate-sized right pleural effusion has mildly enlarged compared with the prior study, and there is a new small left pleural effusion. No pleural based mass or pneumothorax identified. Moderate centrilobular and paraseptal emphysema. Radiation changes in the right perihilar region are similar to the previous study, with associated volume loss, architectural distortion and airway thickening. The left lung appear stable. No confluent airspace disease or suspicious pulmonary nodule. Musculoskeletal/Chest wall: No chest wall mass or suspicious osseous findings. Old left-sided rib fractures. CT ABDOMEN AND PELVIS FINDINGS Hepatobiliary: The liver is normal in density without suspicious focal abnormality. No evidence of gallstones, gallbladder wall thickening or biliary dilatation. Pancreas: Unremarkable. No pancreatic ductal dilatation or surrounding inflammatory changes. Spleen: Normal in size without focal abnormality. Adrenals/Urinary Tract: Both adrenal glands appear normal. No evidence of urinary tract calculus, suspicious renal lesion or hydronephrosis. Stable 3.0 cm simple cyst in the lower pole of the left kidney for which no specific follow-up imaging is recommended. The urinary bladder appears unremarkable for its degree of distention. Stomach/Bowel: Enteric contrast was administered and has passed into  the distal colon. Stable mild nonspecific distal gastric wall thickening without focal abnormality. No evidence of small bowel distension, wall thickening or surrounding inflammation. Mild chronic distal colonic diverticulosis and wall thickening without surrounding inflammation. Vascular/Lymphatic: There are no enlarged abdominal or pelvic lymph nodes. Aortic and branch vessel atherosclerosis without evidence of aneurysm or large vessel occlusion. The portal, superior mesenteric and splenic veins are patent. Reproductive: Hysterectomy.  No adnexal mass. Other: No evidence of abdominal wall mass or hernia. No ascites or pneumoperitoneum. Musculoskeletal: No acute or significant osseous findings. Chronic spondylosis, greatest at L5-S1. Mild generalized subcutaneous edema. Unless specific follow-up recommendations are mentioned in the findings or impression sections, no imaging follow-up of any mentioned incidental findings is recommended. IMPRESSION: 1. Stable radiation changes in the right perihilar region. No evidence of local recurrence or metastatic disease. 2. Enlarging right pleural effusion and new small left pleural effusion. Mild generalized abdominal wall edema. These findings are nonspecific, although could be secondary to volume overload or heart failure. Correlate clinically. 3. No evidence of metastatic disease within the abdomen or pelvis. 4. Aortic Atherosclerosis (ICD10-I70.0) and Emphysema (ICD10-J43.9). Electronically Signed   By: Carey Bullocks M.D.   On: 02/19/2023 12:33    ASSESSMENT AND PLAN: This is a very  pleasant 57 years old white female with Stage IV (T2b, N2, M1 C) non-small cell lung cancer, adenocarcinoma presented with right hilar mass in addition to mediastinal and upper abdominal lymphadenopathy in addition to bilateral adrenal metastases and metastatic disease in the musculature of the lower back.  This was diagnosed in April 2023. The molecular studies by [email or web address] showed no  actionable mutations and the patient has negative PD-L1 expression. She is currently undergoing palliative radiotherapy to the right hilar region under the care of Dr. Mitzi Hansen. The patient is currently on systemic chemotherapy started with carboplatin for AUC of 5, Alimta 500 Mg/M2 and Keytruda 200 Mg IV every 3 weeks and starting from cycle #5 she is on maintenance treatment with Alimta and Keytruda every 3 weeks.  Status post a total of 25 cycles.  The patient has been tolerating her treatment fairly well. She had repeat CT scan of the chest, abdomen and pelvis performed recently.  I personally and independently reviewed the scan images and discussed the result and showed the images to the patient and her aunt today. Her scan showed stable disease except for slightly enlarging right pleural effusion and new small left pleural effusions that need close monitoring.    Stage IV Non-Small Cell Lung Cancer (Adenocarcinoma) Stable disease on recent CT scan with small pleural effusions bilaterally. No new metastatic disease. No respiratory symptoms. Tolerating Alimta and Keytruda well with 25 cycles completed. -Continue Alimta and Keytruda. -Monitor pleural effusions, consider drainage if symptomatic. -Return in 3 weeks for cycle 26.  Bilateral Lower Extremity Edema Mild, possibly related to salt intake. No associated symptoms. -Elevate legs at end of day. -Monitor for worsening edema or associated symptoms.   The patient was advised to call immediately if she has any other concerning symptoms in the interval.  The patient voices understanding of current disease status and treatment options and is in agreement with the current care plan. All questions were answered. The patient knows to call the clinic with any problems, questions or concerns. We can certainly see the patient much sooner if necessary.  The total time spent in the appointment was 30 minutes.  Disclaimer: This note was dictated with  voice recognition software. Similar sounding words can inadvertently be transcribed and may not be corrected upon review.

## 2023-02-20 NOTE — Patient Instructions (Signed)
[facility]  Discharge Instructions: Thank you for choosing [facility] to provide your oncology and hematology care.   If you have a lab appointment with the [facility], please go directly to the [facility] and check in at the registration area.   Wear comfortable clothing and clothing appropriate for easy access to any Portacath or PICC line.   We strive to give you quality time with your provider. You may need to reschedule your appointment if you arrive late (15 or more minutes).  Arriving late affects you and other patients whose appointments are after yours.  Also, if you miss three or more appointments without notifying the office, you may be dismissed from the clinic at the provider's discretion.      For prescription refill requests, have your pharmacy contact our office and allow 72 hours for refills to be completed.    Today you received the following chemotherapy and/or immunotherapy agents: pembrolizumab and pemetrexed      To help prevent nausea and vomiting after your treatment, we encourage you to take your nausea medication as directed.  BELOW ARE SYMPTOMS THAT SHOULD BE REPORTED IMMEDIATELY: *FEVER GREATER THAN 100.4 F (38 C) OR HIGHER *CHILLS OR SWEATING *NAUSEA AND VOMITING THAT IS NOT CONTROLLED WITH YOUR NAUSEA MEDICATION *UNUSUAL SHORTNESS OF BREATH *UNUSUAL BRUISING OR BLEEDING *URINARY PROBLEMS (pain or burning when urinating, or frequent urination) *BOWEL PROBLEMS (unusual diarrhea, constipation, pain near the anus) TENDERNESS IN MOUTH AND THROAT WITH OR WITHOUT PRESENCE OF ULCERS (sore throat, sores in mouth, or a toothache) UNUSUAL RASH, SWELLING OR PAIN  UNUSUAL VAGINAL DISCHARGE OR ITCHING   Items with * indicate a potential emergency and should be followed up as soon as possible or go to the Emergency Department if any problems should occur.  Please show the CHEMOTHERAPY ALERT CARD or IMMUNOTHERAPY  ALERT CARD at check-in to the Emergency Department and triage nurse.  Should you have questions after your visit or need to cancel or reschedule your appointment, please contact [facility]  Dept: [phone]  and follow the prompts.  Office hours are 8:00 a.m. to 4:30 p.m. Monday - Friday. Please note that voicemails left after 4:00 p.m. may not be returned until the following business day.  We are closed weekends and major holidays. You have access to a nurse at all times for urgent questions. Please call the main number to the clinic Dept: [phone] and follow the prompts.   For any non-urgent questions, you may also contact your provider using MyChart. We now offer e-Visits for anyone 43 and older to request care online for non-urgent symptoms. For details visit [email or web address].   Also download the MyChart app! Go to the app store, search "MyChart", open the app, select [facility], and log in with your MyChart username and password.

## 2023-03-10 ENCOUNTER — Encounter: Payer: Self-pay | Admitting: Internal Medicine

## 2023-03-10 ENCOUNTER — Other Ambulatory Visit: Payer: Self-pay | Admitting: Physician Assistant

## 2023-03-10 DIAGNOSIS — C3491 Malignant neoplasm of unspecified part of right bronchus or lung: Secondary | ICD-10-CM

## 2023-03-10 NOTE — Progress Notes (Unsigned)
[facility] OFFICE PROGRESS NOTE  April Mangle, FNP [address] [place] [address]  DIAGNOSIS: Stage IV (T2b, N2, M1 C) non-small cell lung cancer, adenocarcinoma presented with right hilar mass in addition to mediastinal and upper abdominal lymphadenopathy in addition to bilateral adrenal metastases and metastatic disease in the musculature of the lower back.  This was diagnosed in April 2023.   DETECTED ALTERATION(S) / BIOMARKER(S)     % CFDNA OR AMPLIFICATION        ASSOCIATED FDA-APPROVED THERAPIES         CLINICAL TRIAL AVAILABILITY [id number] 0.5% None     Yes [id number] (Splice Site Indel) 0.6% None     Yes   PD-L1 expression 0%  PRIOR THERAPY: Palliative radiotherapy to the right hilar region under the care of Dr. Mitzi Hansen   CURRENT THERAPY: Systemic chemotherapy with carboplatin for AUC of 5, Alimta 500 Mg/M2 and Keytruda 200 Mg IV every 3 weeks.  First dose August 29, 2021. Status post 26 cycles.  Starting from cycle #5, the patient started maintenance Alimta and Keytruda.    INTERVAL HISTORY: April Walsh 57 y.o. female returns to the clinic today for a follow-up visit.  The patient was last seen by Dr. Arbutus Ped 3 weeks ago.  The patient undergoes treatment with Alimta and Keytruda and she tolerates it well without any major concerning adverse side effects.   Her last scan showed some worsening right effusion. She does think over the last few weeks that she is having more shortness of breath.  She also has increased dry cough, mostly at night..  We discussed the option of thoracentesis and she is interested.   Regarding her chemotherapy and immunotherapy she denies any fever, chills, or night sweats.  She lost approximately 2 pounds since last being seen. She denies any chest pain or hemoptysis. Denies any headache or visual changes. She denies nausea, diarrhea, vomiting, or constipation.  She is getting slightly more anemic.  She is on  Eliquis but denies any abnormal bleeding.  She has not been taking any vitamins or iron supplements.  She took iron supplements when she was pregnant in the past.  They do not cause any GI upset.  She denies any rashes or skin changes.  She mentions that she sometimes has bilateral ankle swelling at nighttime.  She has tried to reduce her salt intake.  She is here today for evaluation before starting cycle #27.   MEDICAL HISTORY: Past Medical History:  Diagnosis Date   Anemia    Asthma    Bronchitis    Cancer (HCC)    basal cell melanoma, cervical cancer   COPD (chronic obstructive pulmonary disease) (HCC)    "early stages of COPD"   Headache    Lung cancer (HCC) 08/13/2021    ALLERGIES:  has No Known Allergies.  MEDICATIONS:  Current Outpatient Medications  Medication Sig Dispense Refill   apixaban (ELIQUIS) 2.5 MG TABS tablet Take 1 tablet (2.5 mg total) by mouth 2 (two) times daily. 60 tablet 0   atorvastatin (LIPITOR) 80 MG tablet TAKE 1 TABLET BY MOUTH EVERY DAY 30 tablet 3   BREZTRI AEROSPHERE 160-9-4.8 MCG/ACT AERO INHALE 2 PUFFS INTO THE LUNGS IN THE MORNING AND AT BEDTIME. 10.7 each 11   folic acid (FOLVITE) 1 MG tablet TAKE 1 TABLET BY MOUTH EVERY DAY 30 tablet 2   HYDROcodone-acetaminophen (NORCO) 5-325 MG tablet Take 1 tablet by mouth every 6 (six) hours as needed for moderate  pain. 15 tablet 0   lidocaine-prilocaine (EMLA) cream Apply 1 Application topically as needed. 30 g 2   prochlorperazine (COMPAZINE) 10 MG tablet Take 1 tablet (10 mg total) by mouth every 6 (six) hours as needed. 30 tablet 2   No current facility-administered medications for this visit.   Facility-Administered Medications Ordered in Other Visits  Medication Dose Route Frequency Provider Last Rate Last Admin   cyanocobalamin (VITAMIN B12) 1000 MCG/ML injection            prochlorperazine (COMPAZINE) 10 MG tablet             SURGICAL HISTORY:  Past Surgical History:  Procedure Laterality Date    APPENDECTOMY     1996   BREAST BIOPSY Right    2010-2015   BRONCHIAL BIOPSY  08/13/2021   Procedure: BRONCHIAL BIOPSIES;  Surgeon: Leslye Peer, MD;  Location: [facility] ENDOSCOPY;  Service: Pulmonary;;   BRONCHIAL BRUSHINGS  08/13/2021   Procedure: BRONCHIAL BRUSHINGS;  Surgeon: Leslye Peer, MD;  Location: [facility] ENDOSCOPY;  Service: Pulmonary;;   BRONCHIAL NEEDLE ASPIRATION BIOPSY  08/13/2021   Procedure: BRONCHIAL NEEDLE ASPIRATION BIOPSIES;  Surgeon: Leslye Peer, MD;  Location: [facility] ENDOSCOPY;  Service: Pulmonary;;   CESAREAN SECTION     1984, 1987   FOOT SURGERY Right    2 pins and screw 1999   FOOT SURGERY Left 06/21/2021   screw and 2 pins   HEMOSTASIS CONTROL  08/13/2021   Procedure: HEMOSTASIS CONTROL;  Surgeon: Leslye Peer, MD;  Location: [facility] ENDOSCOPY;  Service: Pulmonary;;   IR IMAGING GUIDED PORT INSERTION  11/26/2021   MELANOMA EXCISION Left    basal cell melanoma 2011   PARTIAL HYSTERECTOMY     1994   TUBAL LIGATION     1987   VIDEO BRONCHOSCOPY  08/13/2021   Procedure: VIDEO BRONCHOSCOPY WITHOUT FLUORO;  Surgeon: Leslye Peer, MD;  Location: [facility] ENDOSCOPY;  Service: Pulmonary;;   VIDEO BRONCHOSCOPY WITH ENDOBRONCHIAL ULTRASOUND N/A 08/13/2021   Procedure: VIDEO BRONCHOSCOPY WITH ENDOBRONCHIAL ULTRASOUND;  Surgeon: Leslye Peer, MD;  Location: [facility] ENDOSCOPY;  Service: Pulmonary;  Laterality: N/A;    REVIEW OF SYSTEMS:   Constitutional: Negative for appetite change, chills, fatigue, fever and unexpected weight change.  HENT: Negative for mouth sores, nosebleeds, sore throat and trouble swallowing.   Eyes: Negative for eye problems and icterus.  Respiratory: Progressive increase in DOE and cough at night. Negative for  hemoptysis and wheezing.   Cardiovascular: Negative for chest pain and leg swelling.  Gastrointestinal: Negative for abdominal pain, constipation, diarrhea, nausea and vomiting.  Genitourinary: Negative for bladder incontinence, difficulty urinating,  dysuria, frequency and hematuria.   Musculoskeletal: Negative for back pain, gait problem, neck pain and neck stiffness.  Skin: Negative for itching and rash.  Neurological: Negative for dizziness, extremity weakness, gait problem, headaches, light-headedness and seizures.  Hematological: Negative for adenopathy. Does not bruise/bleed easily.  Psychiatric/Behavioral: Negative for confusion, depression and sleep disturbance. The patient is not nervous/anxious.    PHYSICAL EXAMINATION:  Blood pressure 112/77, pulse (!) 107, temperature 97.7 F (36.5 C), temperature source Oral, resp. rate 16, height 5\' 1"  (1.549 m), weight 112 lb 8 oz (51 kg), SpO2 100%.  ECOG PERFORMANCE STATUS: 1  Physical Exam  Constitutional: Oriented to person, place, and time and thin appearing female, and in no distress.  HENT:  Head: Normocephalic and atraumatic.  Mouth/Throat: Oropharynx is clear and moist. No oropharyngeal exudate.  Eyes: Conjunctivae are normal. Right eye exhibits  no discharge. Left eye exhibits no discharge. No scleral icterus.  Neck: Normal range of motion. Neck supple.  Cardiovascular: Normal rate, regular rhythm, normal heart sounds and intact distal pulses.   Pulmonary/Chest: Effort normal and breath sounds normal. No respiratory distress. No wheezes. No rales.  Abdominal: Soft. Bowel sounds are normal. Exhibits no distension and no mass. There is no tenderness.  Musculoskeletal: Normal range of motion. Mild lower extremity swelling at night.  Lymphadenopathy:    No cervical adenopathy.  Neurological: Alert and oriented to person, place, and time. Exhibits normal muscle tone. Gait normal. Coordination normal.  Skin: Skin is warm and dry. No rash noted. Not diaphoretic. No erythema. No pallor.  Psychiatric: Mood, memory and judgment normal.  Vitals reviewed.  LABORATORY DATA: Lab Results  Component Value Date   WBC 7.1 03/12/2023   HGB 9.7 (L) 03/12/2023   HCT 29.5 (L) 03/12/2023    MCV 105.7 (H) 03/12/2023   PLT 424 (H) 03/12/2023      Chemistry      Component Value Date/Time   NA 138 02/20/2023 0941   K 4.3 02/20/2023 0941   CL 106 02/20/2023 0941   CO2 27 02/20/2023 0941   BUN 12 02/20/2023 0941   CREATININE 0.70 02/20/2023 0941      Component Value Date/Time   CALCIUM 9.2 02/20/2023 0941   ALKPHOS 62 02/20/2023 0941   AST 18 02/20/2023 0941   ALT 7 02/20/2023 0941   BILITOT 0.2 (L) 02/20/2023 0941       RADIOGRAPHIC STUDIES:  CT CHEST ABDOMEN PELVIS W CONTRAST  Result Date: 02/19/2023 CLINICAL DATA:  Stage IV lung cancer. Restaging post completion of radiation therapy. Chemotherapy and immunotherapy ongoing. * Tracking Code: BO * EXAM: CT CHEST, ABDOMEN, AND PELVIS WITH CONTRAST TECHNIQUE: Multidetector CT imaging of the chest, abdomen and pelvis was performed following the standard protocol during bolus administration of intravenous contrast. RADIATION DOSE REDUCTION: This exam was performed according to the departmental dose-optimization program which includes automated exposure control, adjustment of the mA and/or kV according to patient size and/or use of iterative reconstruction technique. CONTRAST:  OMNIPAQUE IOHEXOL 300 MG/ML  SOLN COMPARISON:  Prior CTs 11/26/2022 and 09/24/2022. FINDINGS: CT CHEST FINDINGS Cardiovascular: No acute vascular findings. Right IJ Port-A-Cath extends to the superior cavoatrial junction. Mild atherosclerosis of the aorta and great vessels. The heart size is normal. There is no pericardial effusion. Mediastinum/Nodes: There are no enlarged mediastinal, hilar or axillary lymph nodes. Stable mild soft tissue thickening around the right hilum. The thyroid gland, trachea and esophagus demonstrate no significant findings. Lungs/Pleura: A moderate-sized right pleural effusion has mildly enlarged compared with the prior study, and there is a new small left pleural effusion. No pleural based mass or pneumothorax identified.  Moderate centrilobular and paraseptal emphysema. Radiation changes in the right perihilar region are similar to the previous study, with associated volume loss, architectural distortion and airway thickening. The left lung appear stable. No confluent airspace disease or suspicious pulmonary nodule. Musculoskeletal/Chest wall: No chest wall mass or suspicious osseous findings. Old left-sided rib fractures. CT ABDOMEN AND PELVIS FINDINGS Hepatobiliary: The liver is normal in density without suspicious focal abnormality. No evidence of gallstones, gallbladder wall thickening or biliary dilatation. Pancreas: Unremarkable. No pancreatic ductal dilatation or surrounding inflammatory changes. Spleen: Normal in size without focal abnormality. Adrenals/Urinary Tract: Both adrenal glands appear normal. No evidence of urinary tract calculus, suspicious renal lesion or hydronephrosis. Stable 3.0 cm simple cyst in the lower pole of the  left kidney for which no specific follow-up imaging is recommended. The urinary bladder appears unremarkable for its degree of distention. Stomach/Bowel: Enteric contrast was administered and has passed into the distal colon. Stable mild nonspecific distal gastric wall thickening without focal abnormality. No evidence of small bowel distension, wall thickening or surrounding inflammation. Mild chronic distal colonic diverticulosis and wall thickening without surrounding inflammation. Vascular/Lymphatic: There are no enlarged abdominal or pelvic lymph nodes. Aortic and branch vessel atherosclerosis without evidence of aneurysm or large vessel occlusion. The portal, superior mesenteric and splenic veins are patent. Reproductive: Hysterectomy.  No adnexal mass. Other: No evidence of abdominal wall mass or hernia. No ascites or pneumoperitoneum. Musculoskeletal: No acute or significant osseous findings. Chronic spondylosis, greatest at L5-S1. Mild generalized subcutaneous edema. Unless specific  follow-up recommendations are mentioned in the findings or impression sections, no imaging follow-up of any mentioned incidental findings is recommended. IMPRESSION: 1. Stable radiation changes in the right perihilar region. No evidence of local recurrence or metastatic disease. 2. Enlarging right pleural effusion and new small left pleural effusion. Mild generalized abdominal wall edema. These findings are nonspecific, although could be secondary to volume overload or heart failure. Correlate clinically. 3. No evidence of metastatic disease within the abdomen or pelvis. 4. Aortic Atherosclerosis (ICD10-I70.0) and Emphysema (ICD10-J43.9). Electronically Signed   By: Carey Bullocks M.D.   On: 02/19/2023 12:33     ASSESSMENT/PLAN:  This is a very pleasant 57 years old Caucasian female with Stage IV (T2b, N2, M1 C) non-small cell lung cancer, adenocarcinoma presented with right hilar mass in addition to mediastinal and upper abdominal lymphadenopathy in addition to bilateral adrenal metastases and metastatic disease in the musculature of the lower back.  This was diagnosed in April 2023.   The molecular studies by [employer] showed no actionable mutations and the patient has negative PD-L1 expression. She completed palliative radiotherapy to the right hilar region under the care of Dr. Mitzi Hansen.   She completed palliative radiation to the right hilar area under the care of Dr. Mitzi Hansen.   She is currently undergoing palliative systemic chemotherapy with carboplatin for an AUC of 5, Alimta 500 mg per metered squared, Keytruda 200 mg IV every 3 weeks.  She is status post 26 cycles.  Her first dose was on 08/29/2021. Starting from cycle #5, she started maintenance alimta and [place].   Labs reviewed. Her CMP is pending. As long as this is within parameters, recommend that she proceed cycle #27 today as scheduled.   We will see her back for follow-up visit in 4 weeks for evaluation repeat blood work before  undergoing cycle #28.   We discussed the pros and cons of a thoracentesis.  Since she is becoming more symptomatic she would be interested in moving forward with a thoracentesis we have reached out to IR and had this scheduled for tomorrow.  She was advised to elevate her lower extremities and use compression stockings.  She is getting more anemic. She is going to start taking an iron supplement every other day and multivitamin.   The patient was advised to call immediately if she has any concerning symptoms in the interval. The patient voices understanding of current disease status and treatment options and is in agreement with the current care plan. All questions were answered. The patient knows to call the clinic with any problems, questions or concerns. We can certainly see the patient much sooner if necessary        Orders Placed This Encounter  Procedures   US THORACENTESIS ASP PLEURAL SPACE W/IMG GUIDE    Standing Status:   Future    Standing Expiration Date:   03/11/2024    Order Specific Question:   Are labs required for specimen collection?    Answer:   No    Order Specific Question:   Reason for Exam (SYMPTOM  OR DIAGNOSIS REQUIRED)    Answer:   Enlarging right pleural effusion    Order Specific Question:   Preferred imaging location?    Answer:   [facility]     The total time spent in the appointment was 20-29 minutes  Laurel Harnden L Kline Bulthuis, PA-C 03/12/23

## 2023-03-11 ENCOUNTER — Telehealth: Payer: Self-pay | Admitting: Physician Assistant

## 2023-03-11 ENCOUNTER — Other Ambulatory Visit: Payer: Self-pay | Admitting: Internal Medicine

## 2023-03-11 NOTE — Telephone Encounter (Signed)
Patient is aware of scheduled appointment times/dates for follow up

## 2023-03-12 ENCOUNTER — Inpatient Hospital Stay: Payer: BC Managed Care – PPO

## 2023-03-12 ENCOUNTER — Encounter: Payer: Self-pay | Admitting: Internal Medicine

## 2023-03-12 ENCOUNTER — Inpatient Hospital Stay: Payer: BC Managed Care – PPO | Admitting: Physician Assistant

## 2023-03-12 VITALS — BP 112/77 | HR 107 | Temp 97.7°F | Resp 16 | Ht 61.0 in | Wt 112.5 lb

## 2023-03-12 VITALS — HR 88

## 2023-03-12 DIAGNOSIS — C3491 Malignant neoplasm of unspecified part of right bronchus or lung: Secondary | ICD-10-CM

## 2023-03-12 DIAGNOSIS — Z5111 Encounter for antineoplastic chemotherapy: Secondary | ICD-10-CM | POA: Diagnosis not present

## 2023-03-12 DIAGNOSIS — Z8541 Personal history of malignant neoplasm of cervix uteri: Secondary | ICD-10-CM | POA: Diagnosis not present

## 2023-03-12 DIAGNOSIS — Z5112 Encounter for antineoplastic immunotherapy: Secondary | ICD-10-CM | POA: Diagnosis not present

## 2023-03-12 DIAGNOSIS — J90 Pleural effusion, not elsewhere classified: Secondary | ICD-10-CM | POA: Diagnosis not present

## 2023-03-12 DIAGNOSIS — Z95828 Presence of other vascular implants and grafts: Secondary | ICD-10-CM

## 2023-03-12 DIAGNOSIS — Z85820 Personal history of malignant melanoma of skin: Secondary | ICD-10-CM | POA: Diagnosis not present

## 2023-03-12 DIAGNOSIS — Z9851 Tubal ligation status: Secondary | ICD-10-CM | POA: Diagnosis not present

## 2023-03-12 DIAGNOSIS — C7972 Secondary malignant neoplasm of left adrenal gland: Secondary | ICD-10-CM | POA: Diagnosis not present

## 2023-03-12 DIAGNOSIS — Z923 Personal history of irradiation: Secondary | ICD-10-CM | POA: Diagnosis not present

## 2023-03-12 DIAGNOSIS — Z9049 Acquired absence of other specified parts of digestive tract: Secondary | ICD-10-CM | POA: Diagnosis not present

## 2023-03-12 DIAGNOSIS — R600 Localized edema: Secondary | ICD-10-CM | POA: Diagnosis not present

## 2023-03-12 DIAGNOSIS — C7971 Secondary malignant neoplasm of right adrenal gland: Secondary | ICD-10-CM | POA: Diagnosis not present

## 2023-03-12 LAB — CBC WITH DIFFERENTIAL (CANCER CENTER ONLY)
Abs Immature Granulocytes: 0.14 10*3/uL — ABNORMAL HIGH (ref 0.00–0.07)
Basophils Absolute: 0.1 10*3/uL (ref 0.0–0.1)
Basophils Relative: 1 %
Eosinophils Absolute: 0.2 10*3/uL (ref 0.0–0.5)
Eosinophils Relative: 2 %
HCT: 29.5 % — ABNORMAL LOW (ref 36.0–46.0)
Hemoglobin: 9.7 g/dL — ABNORMAL LOW (ref 12.0–15.0)
Immature Granulocytes: 2 %
Lymphocytes Relative: 22 %
Lymphs Abs: 1.6 10*3/uL (ref 0.7–4.0)
MCH: 34.8 pg — ABNORMAL HIGH (ref 26.0–34.0)
MCHC: 32.9 g/dL (ref 30.0–36.0)
MCV: 105.7 fL — ABNORMAL HIGH (ref 80.0–100.0)
Monocytes Absolute: 1.1 10*3/uL — ABNORMAL HIGH (ref 0.1–1.0)
Monocytes Relative: 16 %
Neutro Abs: 4 10*3/uL (ref 1.7–7.7)
Neutrophils Relative %: 57 %
Platelet Count: 424 10*3/uL — ABNORMAL HIGH (ref 150–400)
RBC: 2.79 MIL/uL — ABNORMAL LOW (ref 3.87–5.11)
RDW: 17.2 % — ABNORMAL HIGH (ref 11.5–15.5)
WBC Count: 7.1 10*3/uL (ref 4.0–10.5)
nRBC: 0 % (ref 0.0–0.2)

## 2023-03-12 LAB — CMP (CANCER CENTER ONLY)
ALT: 7 U/L (ref 0–44)
AST: 19 U/L (ref 15–41)
Albumin: 3.2 g/dL — ABNORMAL LOW (ref 3.5–5.0)
Alkaline Phosphatase: 68 U/L (ref 38–126)
Anion gap: 6 (ref 5–15)
BUN: 13 mg/dL (ref 6–20)
CO2: 25 mmol/L (ref 22–32)
Calcium: 9.2 mg/dL (ref 8.9–10.3)
Chloride: 108 mmol/L (ref 98–111)
Creatinine: 0.77 mg/dL (ref 0.44–1.00)
GFR, Estimated: >60 mL/min (ref >60)
Glucose, Bld: 82 mg/dL (ref 70–99)
Potassium: 4.2 mmol/L (ref 3.5–5.1)
Sodium: 139 mmol/L (ref 135–145)
Total Bilirubin: 0.1 mg/dL — ABNORMAL LOW (ref 0.3–1.2)
Total Protein: 6.5 g/dL (ref 6.5–8.1)

## 2023-03-12 LAB — TSH: TSH: 0.827 u[IU]/mL (ref 0.350–4.500)

## 2023-03-12 MED ORDER — PROCHLORPERAZINE MALEATE 10 MG PO TABS
10.0000 mg | ORAL_TABLET | Freq: Once | ORAL | Status: AC
  Administered 2023-03-12: 10 mg via ORAL
  Filled 2023-03-12: qty 1

## 2023-03-12 MED ORDER — SODIUM CHLORIDE 0.9 % IV SOLN: Freq: Once | INTRAVENOUS | Status: AC

## 2023-03-12 MED ORDER — SODIUM CHLORIDE 0.9% FLUSH
10.0000 mL | Freq: Once | INTRAVENOUS | Status: AC
  Administered 2023-03-12: 10 mL

## 2023-03-12 MED ORDER — CYANOCOBALAMIN 1000 MCG/ML IJ SOLN
1000.0000 ug | Freq: Once | INTRAMUSCULAR | Status: AC
Start: 2023-03-12 — End: 2023-03-12
  Administered 2023-03-12: 1000 ug via INTRAMUSCULAR
  Filled 2023-03-12: qty 1

## 2023-03-12 MED ORDER — SODIUM CHLORIDE 0.9 % IV SOLN
500.0000 mg/m2 | Freq: Once | INTRAVENOUS | Status: AC
  Administered 2023-03-12: 700 mg via INTRAVENOUS
  Filled 2023-03-12: qty 20

## 2023-03-12 MED ORDER — HEPARIN SOD (PORK) LOCK FLUSH 100 UNIT/ML IV SOLN
500.0000 [IU] | Freq: Once | INTRAVENOUS | Status: AC | PRN
  Administered 2023-03-12: 500 [IU]

## 2023-03-12 MED ORDER — SODIUM CHLORIDE 0.9% FLUSH
10.0000 mL | INTRAVENOUS | Status: DC | PRN
  Administered 2023-03-12: 10 mL

## 2023-03-12 MED ORDER — SODIUM CHLORIDE 0.9 % IV SOLN
200.0000 mg | Freq: Once | INTRAVENOUS | Status: AC
  Administered 2023-03-12: 200 mg via INTRAVENOUS
  Filled 2023-03-12: qty 200

## 2023-03-12 NOTE — Patient Instructions (Signed)
[facility]  Discharge Instructions: Thank you for choosing [facility] to provide your oncology and hematology care.   If you have a lab appointment with the [facility], please go directly to the [facility] and check in at the registration area.   Wear comfortable clothing and clothing appropriate for easy access to any Portacath or PICC line.   We strive to give you quality time with your provider. You may need to reschedule your appointment if you arrive late (15 or more minutes).  Arriving late affects you and other patients whose appointments are after yours.  Also, if you miss three or more appointments without notifying the office, you may be dismissed from the clinic at the provider's discretion.      For prescription refill requests, have your pharmacy contact our office and allow 72 hours for refills to be completed.    Today you received the following chemotherapy and/or immunotherapy agents: Keytruda, Alimta.       To help prevent nausea and vomiting after your treatment, we encourage you to take your nausea medication as directed.  BELOW ARE SYMPTOMS THAT SHOULD BE REPORTED IMMEDIATELY: *FEVER GREATER THAN 100.4 F (38 C) OR HIGHER *CHILLS OR SWEATING *NAUSEA AND VOMITING THAT IS NOT CONTROLLED WITH YOUR NAUSEA MEDICATION *UNUSUAL SHORTNESS OF BREATH *UNUSUAL BRUISING OR BLEEDING *URINARY PROBLEMS (pain or burning when urinating, or frequent urination) *BOWEL PROBLEMS (unusual diarrhea, constipation, pain near the anus) TENDERNESS IN MOUTH AND THROAT WITH OR WITHOUT PRESENCE OF ULCERS (sore throat, sores in mouth, or a toothache) UNUSUAL RASH, SWELLING OR PAIN  UNUSUAL VAGINAL DISCHARGE OR ITCHING   Items with * indicate a potential emergency and should be followed up as soon as possible or go to the Emergency Department if any problems should occur.  Please show the CHEMOTHERAPY ALERT CARD or IMMUNOTHERAPY ALERT CARD  at check-in to the Emergency Department and triage nurse.  Should you have questions after your visit or need to cancel or reschedule your appointment, please contact [facility]  Dept: [phone]  and follow the prompts.  Office hours are 8:00 a.m. to 4:30 p.m. Monday - Friday. Please note that voicemails left after 4:00 p.m. may not be returned until the following business day.  We are closed weekends and major holidays. You have access to a nurse at all times for urgent questions. Please call the main number to the clinic Dept: [phone] and follow the prompts.   For any non-urgent questions, you may also contact your provider using MyChart. We now offer e-Visits for anyone 34 and older to request care online for non-urgent symptoms. For details visit [email or web address].   Also download the MyChart app! Go to the app store, search "MyChart", open the app, select [facility], and log in with your MyChart username and password.

## 2023-03-13 ENCOUNTER — Ambulatory Visit (HOSPITAL_COMMUNITY)
Admission: RE | Admit: 2023-03-13 | Discharge: 2023-03-13 | Disposition: A | Discharge status: Home / Self Care | Payer: BC Managed Care – PPO | Source: Ambulatory Visit | Attending: Student | Admitting: Student

## 2023-03-13 ENCOUNTER — Ambulatory Visit (HOSPITAL_COMMUNITY)
Admission: RE | Admit: 2023-03-13 | Discharge: 2023-03-13 | Disposition: A | Discharge status: Home / Self Care | Payer: BC Managed Care – PPO | Source: Ambulatory Visit | Attending: Physician Assistant | Admitting: Physician Assistant

## 2023-03-13 VITALS — BP 110/82

## 2023-03-13 DIAGNOSIS — Z48813 Encounter for surgical aftercare following surgery on the respiratory system: Secondary | ICD-10-CM | POA: Diagnosis not present

## 2023-03-13 DIAGNOSIS — J90 Pleural effusion, not elsewhere classified: Secondary | ICD-10-CM | POA: Insufficient documentation

## 2023-03-13 DIAGNOSIS — R918 Other nonspecific abnormal finding of lung field: Secondary | ICD-10-CM

## 2023-03-13 LAB — T4: T4, Total: 8.6 ug/dL (ref 4.5–12.0)

## 2023-03-13 MED ORDER — LIDOCAINE HCL 1 % IJ SOLN
INTRAMUSCULAR | Status: AC
  Filled 2023-03-13: qty 20

## 2023-03-13 NOTE — Procedures (Signed)
PROCEDURE SUMMARY:  Successful US guided right thoracentesis. Yielded 900 ml of clear yellow fluid. Pt tolerated procedure well. No immediate complications.  Specimen not sent for labs. CXR ordered; no post-procedure pneumothorax identified.   EBL < 2 mL  Mickie Kay, NP 03/13/2023 3:13 PM

## 2023-03-16 ENCOUNTER — Other Ambulatory Visit: Payer: Self-pay

## 2023-04-03 ENCOUNTER — Encounter: Payer: Self-pay | Admitting: Internal Medicine

## 2023-04-03 ENCOUNTER — Inpatient Hospital Stay: Payer: BC Managed Care – PPO

## 2023-04-03 ENCOUNTER — Inpatient Hospital Stay (HOSPITAL_BASED_OUTPATIENT_CLINIC_OR_DEPARTMENT_OTHER): Payer: BC Managed Care – PPO | Admitting: Internal Medicine

## 2023-04-03 ENCOUNTER — Inpatient Hospital Stay: Payer: BC Managed Care – PPO | Attending: Radiation Oncology

## 2023-04-03 VITALS — HR 97

## 2023-04-03 VITALS — BP 105/66 | HR 104 | Temp 98.0°F | Resp 17 | Ht 61.0 in | Wt 108.0 lb

## 2023-04-03 DIAGNOSIS — D649 Anemia, unspecified: Secondary | ICD-10-CM | POA: Insufficient documentation

## 2023-04-03 DIAGNOSIS — Z9049 Acquired absence of other specified parts of digestive tract: Secondary | ICD-10-CM | POA: Diagnosis not present

## 2023-04-03 DIAGNOSIS — Z85820 Personal history of malignant melanoma of skin: Secondary | ICD-10-CM | POA: Insufficient documentation

## 2023-04-03 DIAGNOSIS — C3491 Malignant neoplasm of unspecified part of right bronchus or lung: Secondary | ICD-10-CM

## 2023-04-03 DIAGNOSIS — J90 Pleural effusion, not elsewhere classified: Secondary | ICD-10-CM | POA: Insufficient documentation

## 2023-04-03 DIAGNOSIS — Z5111 Encounter for antineoplastic chemotherapy: Secondary | ICD-10-CM | POA: Insufficient documentation

## 2023-04-03 DIAGNOSIS — C3490 Malignant neoplasm of unspecified part of unspecified bronchus or lung: Secondary | ICD-10-CM | POA: Diagnosis not present

## 2023-04-03 DIAGNOSIS — Z923 Personal history of irradiation: Secondary | ICD-10-CM | POA: Insufficient documentation

## 2023-04-03 DIAGNOSIS — Z9851 Tubal ligation status: Secondary | ICD-10-CM | POA: Diagnosis not present

## 2023-04-03 DIAGNOSIS — Z5112 Encounter for antineoplastic immunotherapy: Secondary | ICD-10-CM | POA: Insufficient documentation

## 2023-04-03 DIAGNOSIS — C7972 Secondary malignant neoplasm of left adrenal gland: Secondary | ICD-10-CM | POA: Insufficient documentation

## 2023-04-03 DIAGNOSIS — R634 Abnormal weight loss: Secondary | ICD-10-CM | POA: Diagnosis not present

## 2023-04-03 DIAGNOSIS — Z8541 Personal history of malignant neoplasm of cervix uteri: Secondary | ICD-10-CM | POA: Diagnosis not present

## 2023-04-03 DIAGNOSIS — C7971 Secondary malignant neoplasm of right adrenal gland: Secondary | ICD-10-CM | POA: Diagnosis not present

## 2023-04-03 LAB — CMP (CANCER CENTER ONLY)
ALT: 10 U/L (ref 0–44)
AST: 22 U/L (ref 15–41)
Albumin: 3.1 g/dL — ABNORMAL LOW (ref 3.5–5.0)
Alkaline Phosphatase: 80 U/L (ref 38–126)
Anion gap: 6 (ref 5–15)
BUN: 13 mg/dL (ref 6–20)
CO2: 26 mmol/L (ref 22–32)
Calcium: 9 mg/dL (ref 8.9–10.3)
Chloride: 104 mmol/L (ref 98–111)
Creatinine: 0.77 mg/dL (ref 0.44–1.00)
GFR, Estimated: >60 mL/min (ref >60)
Glucose, Bld: 88 mg/dL (ref 70–99)
Potassium: 4.4 mmol/L (ref 3.5–5.1)
Sodium: 136 mmol/L (ref 135–145)
Total Bilirubin: 0.2 mg/dL (ref <1.2)
Total Protein: 6.5 g/dL (ref 6.5–8.1)

## 2023-04-03 LAB — CBC WITH DIFFERENTIAL (CANCER CENTER ONLY)
Abs Immature Granulocytes: 0.08 10*3/uL — ABNORMAL HIGH (ref 0.00–0.07)
Basophils Absolute: 0 10*3/uL (ref 0.0–0.1)
Basophils Relative: 0 %
Eosinophils Absolute: 0.1 10*3/uL (ref 0.0–0.5)
Eosinophils Relative: 1 %
HCT: 28.5 % — ABNORMAL LOW (ref 36.0–46.0)
Hemoglobin: 9.1 g/dL — ABNORMAL LOW (ref 12.0–15.0)
Immature Granulocytes: 1 %
Lymphocytes Relative: 19 %
Lymphs Abs: 1.1 10*3/uL (ref 0.7–4.0)
MCH: 34.6 pg — ABNORMAL HIGH (ref 26.0–34.0)
MCHC: 31.9 g/dL (ref 30.0–36.0)
MCV: 108.4 fL — ABNORMAL HIGH (ref 80.0–100.0)
Monocytes Absolute: 1.1 10*3/uL — ABNORMAL HIGH (ref 0.1–1.0)
Monocytes Relative: 21 %
Neutro Abs: 3.2 10*3/uL (ref 1.7–7.7)
Neutrophils Relative %: 58 %
Platelet Count: 304 10*3/uL (ref 150–400)
RBC: 2.63 MIL/uL — ABNORMAL LOW (ref 3.87–5.11)
RDW: 18.4 % — ABNORMAL HIGH (ref 11.5–15.5)
WBC Count: 5.5 10*3/uL (ref 4.0–10.5)
nRBC: 0 % (ref 0.0–0.2)

## 2023-04-03 MED ORDER — HEPARIN SOD (PORK) LOCK FLUSH 100 UNIT/ML IV SOLN
500.0000 [IU] | Freq: Once | INTRAVENOUS | Status: AC | PRN
  Administered 2023-04-03: 500 [IU]

## 2023-04-03 MED ORDER — SODIUM CHLORIDE 0.9 % IV SOLN: Freq: Once | INTRAVENOUS | Status: AC

## 2023-04-03 MED ORDER — SODIUM CHLORIDE 0.9 % IV SOLN
200.0000 mg | Freq: Once | INTRAVENOUS | Status: AC
  Administered 2023-04-03: 200 mg via INTRAVENOUS
  Filled 2023-04-03: qty 200

## 2023-04-03 MED ORDER — SODIUM CHLORIDE 0.9% FLUSH
10.0000 mL | INTRAVENOUS | Status: DC | PRN
  Administered 2023-04-03: 10 mL

## 2023-04-03 MED ORDER — PROCHLORPERAZINE MALEATE 10 MG PO TABS
10.0000 mg | ORAL_TABLET | Freq: Once | ORAL | Status: AC
  Administered 2023-04-03: 10 mg via ORAL
  Filled 2023-04-03: qty 1

## 2023-04-03 MED ORDER — SODIUM CHLORIDE 0.9 % IV SOLN
500.0000 mg/m2 | Freq: Once | INTRAVENOUS | Status: AC
  Administered 2023-04-03: 700 mg via INTRAVENOUS
  Filled 2023-04-03: qty 20

## 2023-04-03 NOTE — Progress Notes (Signed)
[facility] [phone]   [phone]  OFFICE PROGRESS NOTE  [name], FNP [address] [place] [address]  DIAGNOSIS: Stage IV (T2b, N2, M1 C) non-small cell lung cancer, adenocarcinoma presented with right hilar mass in addition to mediastinal and upper abdominal lymphadenopathy in addition to bilateral adrenal metastases and metastatic disease in the musculature of the lower back.  This was diagnosed in April 2023.  DETECTED ALTERATION(S) / BIOMARKER(S) % CFDNA OR AMPLIFICATION ASSOCIATED FDA-APPROVED THERAPIES CLINICAL TRIAL AVAILABILITY [id number] 0.5% None  Yes [id number] (Splice Site Indel) 0.6% None  Yes  PD-L1 expression 0%  PRIOR THERAPY: Palliative radiotherapy to the right hilar region under the care of Dr. Mitzi Hansen.  CURRENT THERAPY:  systemic chemotherapy with carboplatin for AUC of 5, Alimta 500 Mg/M2 and Keytruda 200 Mg IV every 3 weeks.  First dose August 29, 2021.  Status post 25 cycles.  Starting from cycle #5 the patient is on maintenance treatment with Alimta and Keytruda every 3 weeks.  INTERVAL HISTORY: April Walsh 57 y.o. female returns to the clinic today for follow-up visit.Discussed the use of AI scribe software for clinical note transcription with the patient, who gave verbal consent to proceed.  History of Present Illness   The patient, a 57 year old with a history of stage 4 lung cancer and carcinoma, has been undergoing chemotherapy since April 2023. Initially, she received four cycles of Carboplatin, Alimta, and Keytruda. Subsequently, she was maintained on Alimta and Keytruda every three weeks, completing a total of twenty-seven cycles to date.  Three weeks prior to the current visit, she had a liter of pleural effusion drained from the right lung, which significantly improved her breathing. She denies any new complaints, including chest pain, shortness of breath, cough, nausea, vomiting,  diarrhea, or recent weight loss. However, she did note a weight loss of approximately five pounds since her last visit, which she attributes to the fluid loss from the pleural effusion drainage.  The patient also has a known history of anemia, with a recent hemoglobin level of 9.1, and is currently on iron supplements.         MEDICAL HISTORY: Past Medical History:  Diagnosis Date   Anemia    Asthma    Bronchitis    Cancer (HCC)    basal cell melanoma, cervical cancer   COPD (chronic obstructive pulmonary disease) (HCC)    "early stages of COPD"   Headache    Lung cancer (HCC) 08/13/2021    ALLERGIES:  has No Known Allergies.  MEDICATIONS:  Current Outpatient Medications  Medication Sig Dispense Refill   apixaban (ELIQUIS) 2.5 MG TABS tablet Take 1 tablet (2.5 mg total) by mouth 2 (two) times daily. 60 tablet 0   atorvastatin (LIPITOR) 80 MG tablet TAKE 1 TABLET BY MOUTH EVERY DAY 30 tablet 3   BREZTRI AEROSPHERE 160-9-4.8 MCG/ACT AERO INHALE 2 PUFFS INTO THE LUNGS IN THE MORNING AND AT BEDTIME. 10.7 each 11   folic acid (FOLVITE) 1 MG tablet TAKE 1 TABLET BY MOUTH EVERY DAY 30 tablet 2   HYDROcodone-acetaminophen (NORCO) 5-325 MG tablet Take 1 tablet by mouth every 6 (six) hours as needed for moderate pain. 15 tablet 0   lidocaine-prilocaine (EMLA) cream Apply 1 Application topically as needed. 30 g 2   prochlorperazine (COMPAZINE) 10 MG tablet Take 1 tablet (10 mg total) by mouth every 6 (six) hours as needed. 30 tablet 2   No current facility-administered medications for  this visit.   Facility-Administered Medications Ordered in Other Visits  Medication Dose Route Frequency Provider Last Rate Last Admin   cyanocobalamin (VITAMIN B12) 1000 MCG/ML injection            prochlorperazine (COMPAZINE) 10 MG tablet             SURGICAL HISTORY:  Past Surgical History:  Procedure Laterality Date   APPENDECTOMY     1996   BREAST BIOPSY Right    2010-2015   BRONCHIAL BIOPSY   08/13/2021   Procedure: BRONCHIAL BIOPSIES;  Surgeon: Leslye Peer, MD;  Location: [facility];  Service: Pulmonary;;   BRONCHIAL BRUSHINGS  08/13/2021   Procedure: BRONCHIAL BRUSHINGS;  Surgeon: Leslye Peer, MD;  Location[facility] ENDOSCOPY;  Service: Pulmonary;;   BRONCHIAL NEEDLE ASPIRATION BIOPSY  08/13/2021   Procedure: BRONCHIAL NEEDLE ASPIRATION BIOPSIES;  Surgeon: Leslye Peer, MD;  Location: [facility] ENDOSCOPY;  Service: Pulmonary;;   CESAREAN SECTION     1984, 1987   FOOT SURGERY Right    2 pins and screw 1999   FOOT SURGERY Left 06/21/2021   screw and 2 pins   HEMOSTASIS CONTROL  08/13/2021   Procedure: HEMOSTASIS CONTROL;  Surgeon: Leslye Peer, MD;  Location: [facility];  Service: Pulmonary;;   IR IMAGING GUIDED PORT INSERTION  11/26/2021   MELANOMA EXCISION Left    basal cell melanoma 2011   PARTIAL HYSTERECTOMY     1994   TUBAL LIGATION     1987   VIDEO BRONCHOSCOPY  08/13/2021   Procedure: VIDEO BRONCHOSCOPY WITHOUT FLUORO;  Surgeon: Leslye Peer, MD;  Location: [facility] ENDOSCOPY;  Service: Pulmonary;;   VIDEO BRONCHOSCOPY WITH ENDOBRONCHIAL ULTRASOUND N/A 08/13/2021   Procedure: VIDEO BRONCHOSCOPY WITH ENDOBRONCHIAL ULTRASOUND;  Surgeon: Leslye Peer, MD;  Location: [facility]OSCOPY;  Service: Pulmonary;  Laterality: N/A;    REVIEW OF SYSTEMS:  A comprehensive review of systems was negative.   PHYSICAL EXAMINATION: General appearance: alert, cooperative, and no distress Head: Normocephalic, without obvious abnormality, atraumatic Neck: no adenopathy, no JVD, supple, symmetrical, trachea midline, and thyroid not enlarged, symmetric, no tenderness/mass/nodules Lymph nodes: Cervical, supraclavicular, and axillary nodes normal. Resp: clear to auscultation bilaterally Back: symmetric, no curvature. ROM normal. No CVA tenderness. Cardio: regular rate and rhythm, S1, S2 normal, no murmur, click, rub or gallop GI: soft, non-tender; bowel sounds normal; no masses,  no  organomegaly Extremities: extremities normal, atraumatic, no cyanosis or edema  ECOG PERFORMANCE STATUS: 1 - Symptomatic but completely ambulatory  Blood pressure 105/66, pulse (!) 104, temperature 98 F (36.7 C), temperature source Temporal, resp. rate 17, height 5\' 1"  (1.549 m), weight 108 lb (49 kg), SpO2 100%.  LABORATORY DATA: Lab Results  Component Value Date   WBC 5.5 04/03/2023   HGB 9.1 (L) 04/03/2023   HCT 28.5 (L) 04/03/2023   MCV 108.4 (H) 04/03/2023   PLT 304 04/03/2023      Chemistry      Component Value Date/Time   NA 136 04/03/2023 1043   K 4.4 04/03/2023 1043   CL 104 04/03/2023 1043   CO2 26 04/03/2023 1043   BUN 13 04/03/2023 1043   CREATININE 0.77 04/03/2023 1043      Component Value Date/Time   CALCIUM 9.0 04/03/2023 1043   ALKPHOS 80 04/03/2023 1043   AST 22 04/03/2023 1043   ALT 10 04/03/2023 1043   BILITOT 0.2 04/03/2023 1043       RADIOGRAPHIC STUDIES: US THORACENTESIS ASP PLEURAL SPACE W/IMG GUIDE  Result Date: 03/13/2023 INDICATION: Patient with a history of lung cancer presents today with a right pleural effusion. Therapeutic thoracentesis requested. EXAM: ULTRASOUND GUIDED THORACENTESIS MEDICATIONS: 1% lidocaine 10 mL COMPLICATIONS: None immediate. PROCEDURE: An ultrasound guided thoracentesis was thoroughly discussed with the patient and questions answered. The benefits, risks, alternatives and complications were also discussed. The patient understands and wishes to proceed with the procedure. Written consent was obtained. Ultrasound was performed to localize and mark an adequate pocket of fluid in the right chest. The area was then prepped and draped in the normal sterile fashion. 1% Lidocaine was used for local anesthesia. Under ultrasound guidance a 6 Fr Safe-T-Centesis catheter was introduced. Thoracentesis was performed. The catheter was removed and a dressing applied. FINDINGS: A total of approximately 900 mL of clear yellow fluid was  removed. IMPRESSION: Successful ultrasound guided right thoracentesis yielding 900 mL of pleural fluid. Procedure performed by Alwyn Ren NP Electronically Signed   By: Simonne Come M.D.   On: 03/13/2023 15:38   DG Chest 1 View  Result Date: 03/13/2023 CLINICAL DATA:  Post right-sided thoracentesis. EXAM: CHEST  1 VIEW COMPARISON:  Chest CT-02/11/2023 FINDINGS: Grossly unchanged cardiac silhouette and mediastinal contours. Stable position of support apparatus. Redemonstrated architectural distortion and volume loss involving the right suprahilar lung. Interval reduction in persistent trace right-sided effusion post thoracentesis with improved aeration of the right lung base. No pneumothorax. The left lung remains well aerated. No evidence of edema. No acute osseous abnormalities. IMPRESSION: Interval reduction in persistent trace right-sided effusion post thoracentesis with improved aeration of the right lung base. No pneumothorax. Electronically Signed   By: Simonne Come M.D.   On: 03/13/2023 14:46    ASSESSMENT AND PLAN: This is a very pleasant 57 years old white female with Stage IV (T2b, N2, M1 C) non-small cell lung cancer, adenocarcinoma presented with right hilar mass in addition to mediastinal and upper abdominal lymphadenopathy in addition to bilateral adrenal metastases and metastatic disease in the musculature of the lower back.  This was diagnosed in April 2023. The molecular studies by [email or web address] showed no actionable mutations and the patient has negative PD-L1 expression. She is currently undergoing palliative radiotherapy to the right hilar region under the care of Dr. Mitzi Hansen. The patient is currently on systemic chemotherapy started with carboplatin for AUC of 5, Alimta 500 Mg/M2 and Keytruda 200 Mg IV every 3 weeks and starting from cycle #5 she is on maintenance treatment with Alimta and Keytruda every 3 weeks.  Status post a total of 27 cycles.  The patient has been tolerating her  treatment fairly well.    Non-Small Cell Lung Cancer (NSCLC) Undergoing treatment since April 2023. Completed 4 cycles of chemotherapy with carboplatin, pemetrexed, and pembrolizumab. Currently on maintenance therapy with pemetrexed and pembrolizumab every three weeks for total of 27 cycles. Reports no new complaints and improved after recent pleural effusion drainage. Persistent anemia with hemoglobin at 9.1. Emphasized the importance of continuing maintenance therapy and planned repeat scans to monitor disease progression. - Continue maintenance therapy with pemetrexed and pembrolizumab - Repeat chest, abdomen, and pelvis scans one week before the next visit - Follow-up in three weeks  Pleural Effusion Right-sided pleural effusion recently drained, resulting in significant symptomatic improvement. No new symptoms reported. - Monitor for recurrence of pleural effusion symptoms  Anemia Persistent anemia with hemoglobin at 9.1. Currently on iron supplements. Discussed the need to continue iron supplementation. - Continue iron supplements.   The patient was advised to call immediately  if she has any concerning symptoms in the interval.  The patient voices understanding of current disease status and treatment options and is in agreement with the current care plan. All questions were answered. The patient knows to call the clinic with any problems, questions or concerns. We can certainly see the patient much sooner if necessary.  The total time spent in the appointment was 20 minutes.  Disclaimer: This note was dictated with voice recognition software. Similar sounding words can inadvertently be transcribed and may not be corrected upon review.

## 2023-04-03 NOTE — Patient Instructions (Signed)
[facility] - A DEPT OF [facility]  Discharge Instructions: Thank you for choosing [facility] to provide your oncology and hematology care.   If you have a lab appointment with the Cancer Center, please go directly to the Cancer Center and check in at the registration area.   Wear comfortable clothing and clothing appropriate for easy access to any Portacath or PICC line.   We strive to give you quality time with your provider. You may need to reschedule your appointment if you arrive late (15 or more minutes).  Arriving late affects you and other patients whose appointments are after yours.  Also, if you miss three or more appointments without notifying the office, you may be dismissed from the clinic at the provider's discretion.      For prescription refill requests, have your pharmacy contact our office and allow 72 hours for refills to be completed.    Today you received the following chemotherapy and/or immunotherapy agents: Alimta & Keytruda      To help prevent nausea and vomiting after your treatment, we encourage you to take your nausea medication as directed.  BELOW ARE SYMPTOMS THAT SHOULD BE REPORTED IMMEDIATELY: *FEVER GREATER THAN 100.4 F (38 C) OR HIGHER *CHILLS OR SWEATING *NAUSEA AND VOMITING THAT IS NOT CONTROLLED WITH YOUR NAUSEA MEDICATION *UNUSUAL SHORTNESS OF BREATH *UNUSUAL BRUISING OR BLEEDING *URINARY PROBLEMS (pain or burning when urinating, or frequent urination) *BOWEL PROBLEMS (unusual diarrhea, constipation, pain near the anus) TENDERNESS IN MOUTH AND THROAT WITH OR WITHOUT PRESENCE OF ULCERS (sore throat, sores in mouth, or a toothache) UNUSUAL RASH, SWELLING OR PAIN  UNUSUAL VAGINAL DISCHARGE OR ITCHING   Items with * indicate a potential emergency and should be followed up as soon as possible or go to the Emergency Department if any problems should occur.  Please show the CHEMOTHERAPY ALERT CARD or  IMMUNOTHERAPY ALERT CARD at check-in to the Emergency Department and triage nurse.  Should you have questions after your visit or need to cancel or reschedule your appointment, please contact [facility] - A DEPT OF [facility]  Dept: [phone]  and follow the prompts.  Office hours are 8:00 a.m. to 4:30 p.m. Monday - Friday. Please note that voicemails left after 4:00 p.m. may not be returned until the following business day.  We are closed weekends and major holidays. You have access to a nurse at all times for urgent questions. Please call the main number to the clinic Dept: [phone] and follow the prompts.   For any non-urgent questions, you may also contact your provider using MyChart. We now offer e-Visits for anyone 45 and older to request care online for non-urgent symptoms. For details visit [email or web address].   Also download the MyChart app! Go to the app store, search "MyChart", open the app, select [place], and log in with your MyChart username and password.

## 2023-04-17 ENCOUNTER — Ambulatory Visit (HOSPITAL_COMMUNITY)
Admission: RE | Admit: 2023-04-17 | Discharge: 2023-04-17 | Disposition: A | Discharge status: Home / Self Care | Payer: BC Managed Care – PPO | Source: Ambulatory Visit | Attending: Internal Medicine | Admitting: Internal Medicine

## 2023-04-17 ENCOUNTER — Other Ambulatory Visit: Payer: Self-pay | Admitting: Internal Medicine

## 2023-04-17 DIAGNOSIS — C3490 Malignant neoplasm of unspecified part of unspecified bronchus or lung: Secondary | ICD-10-CM

## 2023-04-17 DIAGNOSIS — K5730 Diverticulosis of large intestine without perforation or abscess without bleeding: Secondary | ICD-10-CM | POA: Diagnosis not present

## 2023-04-17 DIAGNOSIS — J90 Pleural effusion, not elsewhere classified: Secondary | ICD-10-CM | POA: Diagnosis not present

## 2023-04-17 MED ORDER — IOHEXOL 300 MG/ML  SOLN
30.0000 mL | Freq: Once | INTRAMUSCULAR | Status: AC | PRN
  Administered 2023-04-17: 30 mL via ORAL

## 2023-04-17 MED ORDER — IOHEXOL 300 MG/ML  SOLN
100.0000 mL | Freq: Once | INTRAMUSCULAR | Status: AC | PRN
  Administered 2023-04-17: 100 mL via INTRAVENOUS

## 2023-04-17 MED ORDER — HEPARIN SOD (PORK) LOCK FLUSH 100 UNIT/ML IV SOLN
INTRAVENOUS | Status: AC
  Filled 2023-04-17: qty 5

## 2023-04-17 MED ORDER — HEPARIN SOD (PORK) LOCK FLUSH 100 UNIT/ML IV SOLN
500.0000 [IU] | Freq: Once | INTRAVENOUS | Status: AC
  Administered 2023-04-17: 500 [IU] via INTRAVENOUS

## 2023-04-19 NOTE — Progress Notes (Signed)
[facility] OFFICE PROGRESS NOTE  April Mangle, FNP [address] [place] [address]  DIAGNOSIS: Stage IV (T2b, N2, M1 C) non-small cell lung cancer, adenocarcinoma presented with right hilar mass in addition to mediastinal and upper abdominal lymphadenopathy in addition to bilateral adrenal metastases and metastatic disease in the musculature of the lower back.  This was diagnosed in April 2023.   DETECTED ALTERATION(S) / BIOMARKER(S)     % CFDNA OR AMPLIFICATION        ASSOCIATED FDA-APPROVED THERAPIES         CLINICAL TRIAL AVAILABILITY [id number] 0.5% None     Yes [id number] (Splice Site Indel) 0.6% None     Yes   PD-L1 expression 0%  PRIOR THERAPY: Palliative radiotherapy to the right hilar region under the care of Dr. Mitzi Hansen   CURRENT THERAPY: Systemic chemotherapy with carboplatin for AUC of 5, Alimta 500 Mg/M2 and Keytruda 200 Mg IV every 3 weeks.  First dose August 29, 2021. Status post 28 cycles.  Starting from cycle #5, the patient started maintenance Alimta and Keytruda.  Her dose of Alimta was reduced to 400 mg/m starting from cycle #29 due to worsening anemia.  INTERVAL HISTORY: April Walsh 57 y.o. female returns to the clinic today for a follow-up visit.  The patient was last seen by Dr. Arbutus Ped 3 weeks ago.  The patient undergoes treatment with Alimta and Keytruda and she tolerates it well without any major concerning adverse side effects.   She does have worsening anemia. She started taking a prenatal iron. Regarding her chemotherapy and immunotherapy she denies any fever, chills, or night sweats.  She denies any recent URI.  Denies any changes with her baseline dyspnea on exertion.  She denies any chest pain or hemoptysis. Denies any headache or visual changes. She denies nausea, diarrhea, vomiting, or constipation.  She denies visible bleeding. She has never had colonoscopy. She is on eliquis. She denies any rashes or skin changes.      She recently had a restaging CT scan performed.  She is here today for evaluation before starting cycle #29.   MEDICAL HISTORY: Past Medical History:  Diagnosis Date   Anemia    Asthma    Bronchitis    Cancer (HCC)    basal cell melanoma, cervical cancer   COPD (chronic obstructive pulmonary disease) (HCC)    "early stages of COPD"   Headache    Lung cancer (HCC) 08/13/2021    ALLERGIES:  has no known allergies.  MEDICATIONS:  Current Outpatient Medications  Medication Sig Dispense Refill   apixaban (ELIQUIS) 2.5 MG TABS tablet Take 1 tablet (2.5 mg total) by mouth 2 (two) times daily. 60 tablet 0   atorvastatin (LIPITOR) 80 MG tablet TAKE 1 TABLET BY MOUTH EVERY DAY 30 tablet 3   BREZTRI AEROSPHERE 160-9-4.8 MCG/ACT AERO INHALE 2 PUFFS INTO THE LUNGS IN THE MORNING AND AT BEDTIME. 10.7 each 11   folic acid (FOLVITE) 1 MG tablet TAKE 1 TABLET BY MOUTH EVERY DAY 30 tablet 2   HYDROcodone-acetaminophen (NORCO) 5-325 MG tablet Take 1 tablet by mouth every 6 (six) hours as needed for moderate pain. 15 tablet 0   lidocaine-prilocaine (EMLA) cream Apply 1 Application topically as needed. 30 g 2   prochlorperazine (COMPAZINE) 10 MG tablet Take 1 tablet (10 mg total) by mouth every 6 (six) hours as needed. 30 tablet 2   No current facility-administered medications for this visit.   Facility-Administered Medications Ordered in  Other Visits  Medication Dose Route Frequency Provider Last Rate Last Admin   cyanocobalamin (VITAMIN B12) 1000 MCG/ML injection            prochlorperazine (COMPAZINE) 10 MG tablet             SURGICAL HISTORY:  Past Surgical History:  Procedure Laterality Date   APPENDECTOMY     1996   BREAST BIOPSY Right    2010-2015   BRONCHIAL BIOPSY  08/13/2021   Procedure: BRONCHIAL BIOPSIES;  Surgeon: Leslye Peer, MD;  Location: [facility] ENDOSCOPY;  Service: Pulmonary;;   BRONCHIAL BRUSHINGS  08/13/2021   Procedure: BRONCHIAL BRUSHINGS;  Surgeon: Leslye Peer,  MD;  Location: [facility] ENDOSCOPY;  Service: Pulmonary;;   BRONCHIAL NEEDLE ASPIRATION BIOPSY  08/13/2021   Procedure: BRONCHIAL NEEDLE ASPIRATION BIOPSIES;  Surgeon: Leslye Peer, MD;  Location: [facility] ENDOSCOPY;  Service: Pulmonary;;   CESAREAN SECTION     1984, 1987   FOOT SURGERY Right    2 pins and screw 1999   FOOT SURGERY Left 06/21/2021   screw and 2 pins   HEMOSTASIS CONTROL  08/13/2021   Procedure: HEMOSTASIS CONTROL;  Surgeon: Leslye Peer, MD;  Location: [facility] ENDOSCOPY;  Service: Pulmonary;;   IR IMAGING GUIDED PORT INSERTION  11/26/2021   MELANOMA EXCISION Left    basal cell melanoma 2011   PARTIAL HYSTERECTOMY     1994   TUBAL LIGATION     1987   VIDEO BRONCHOSCOPY  08/13/2021   Procedure: VIDEO BRONCHOSCOPY WITHOUT FLUORO;  Surgeon: Leslye Peer, MD;  Location: [facility] ENDOSCOPY;  Service: Pulmonary;;   VIDEO BRONCHOSCOPY WITH ENDOBRONCHIAL ULTRASOUND N/A 08/13/2021   Procedure: VIDEO BRONCHOSCOPY WITH ENDOBRONCHIAL ULTRASOUND;  Surgeon: Leslye Peer, MD;  Location: [facility] ENDOSCOPY;  Service: Pulmonary;  Laterality: N/A;    REVIEW OF SYSTEMS:   Constitutional: Negative for appetite change, chills, fatigue, fever and unexpected weight change.  HENT: Negative for mouth sores, nosebleeds, sore throat and trouble swallowing.   Eyes: Negative for eye problems and icterus.  Respiratory: Positive for dyspnea on exertion. Positive for cough. Negative for  hemoptysis and wheezing.   Cardiovascular: Negative for chest pain and leg swelling.  Gastrointestinal: Negative for abdominal pain, constipation, diarrhea, nausea and vomiting.  Genitourinary: Negative for bladder incontinence, difficulty urinating, dysuria, frequency and hematuria.   Musculoskeletal: Negative for back pain, gait problem, neck pain and neck stiffness.  Skin: Negative for itching and rash.  Neurological: Negative for dizziness, extremity weakness, gait problem, headaches, light-headedness and seizures.  Hematological:  Negative for adenopathy. Does not bruise/bleed easily.  Psychiatric/Behavioral: Negative for confusion, depression and sleep disturbance. The patient is not nervous/anxious.      PHYSICAL EXAMINATION:  Blood pressure 113/78, pulse (!) 105, temperature 97.6 F (36.4 C), temperature source Temporal, resp. rate 16, height 5' (1.524 m), weight 115 lb 6.4 oz (52.3 kg), SpO2 99%.  ECOG PERFORMANCE STATUS: 1  Physical Exam  Constitutional: Oriented to person, place, and time and well-developed, well-nourished, and in no distress.  HENT:  Head: Normocephalic and atraumatic.  Mouth/Throat: Oropharynx is clear and moist. No oropharyngeal exudate.  Eyes: Conjunctivae are normal. Right eye exhibits no discharge. Left eye exhibits no discharge. No scleral icterus.  Neck: Normal range of motion. Neck supple.  Cardiovascular: Normal rate, regular rhythm, normal heart sounds and intact distal pulses.   Pulmonary/Chest: Effort normal and breath sounds normal. No respiratory distress. No wheezes. No rales.  Abdominal: Soft. Bowel sounds are normal. Exhibits no distension  and no mass. There is no tenderness.  Musculoskeletal: Normal range of motion. Exhibits no edema.  Lymphadenopathy:    No cervical adenopathy.  Neurological: Alert and oriented to person, place, and time. Exhibits normal muscle tone. Gait normal. Coordination normal.  Skin: Skin is warm and dry. No rash noted. Not diaphoretic. No erythema. No pallor.  Psychiatric: Mood, memory and judgment normal.  Vitals reviewed.  LABORATORY DATA: Lab Results  Component Value Date   WBC 5.3 04/24/2023   HGB 8.3 (L) 04/24/2023   HCT 26.4 (L) 04/24/2023   MCV 115.3 (H) 04/24/2023   PLT 329 04/24/2023      Chemistry      Component Value Date/Time   NA 139 04/24/2023 0845   K 4.1 04/24/2023 0845   CL 109 04/24/2023 0845   CO2 25 04/24/2023 0845   BUN 12 04/24/2023 0845   CREATININE 0.63 04/24/2023 0845      Component Value Date/Time    CALCIUM 8.4 (L) 04/24/2023 0845   ALKPHOS 64 04/24/2023 0845   AST 21 04/24/2023 0845   ALT 7 04/24/2023 0845   BILITOT 0.2 04/24/2023 0845       RADIOGRAPHIC STUDIES:  CT CHEST ABDOMEN PELVIS W CONTRAST Result Date: 04/17/2023 CLINICAL DATA:  History of stage IV lung cancer, restaging. * Tracking Code: BO * EXAM: CT CHEST, ABDOMEN, AND PELVIS WITH CONTRAST TECHNIQUE: Multidetector CT imaging of the chest, abdomen and pelvis was performed following the standard protocol during bolus administration of intravenous contrast. RADIATION DOSE REDUCTION: This exam was performed according to the departmental dose-optimization program which includes automated exposure control, adjustment of the mA and/or kV according to patient size and/or use of iterative reconstruction technique. CONTRAST:  OMNIPAQUE IOHEXOL 300 MG/ML  SOLN COMPARISON:  Multiple priors including most recent CT February 11, 2023 FINDINGS: CT CHEST FINDINGS Cardiovascular: Accessed right chest Port-A-Cath with tip in the right atrium. Aortic atherosclerosis. No central pulmonary embolus on this nondedicated study. Normal size heart. Similar trace pericardial effusion. Mediastinum/Nodes: No suspicious thyroid nodule. No pathologically enlarged mediastinal, hilar or axillary lymph nodes. The esophagus is grossly unremarkable. Similar mild soft tissue thickening about the right hilum with stranding in the middle mediastinum. Lungs/Pleura: Right perihilar volume loss, architectural distortion and airway thickening is again seen compatible with postradiation change including a nodular component along the superior aspect measuring 2.2 x 1.4 cm on image 46/6 previously 2.5 x 1.5 cm. No new discrete suspicious nodularity in the treatment bed. No new suspicious pulmonary nodules or masses. Again seen is multifocal interlobular septal thickening with with new multifocal patchy ground-glass opacities for instance in the left lower lobe on image 74/6 and  in the right lower lobe on image 56-57. Similar size of the moderate right and small left pleural effusions with adjacent atelectasis. Musculoskeletal: No aggressive lytic or blastic lesion of bone. Remote right-sided rib fractures. CT ABDOMEN PELVIS FINDINGS Hepatobiliary: No suspicious hepatic lesion. Gallbladder is unremarkable. No biliary ductal dilation. Pancreas: No pancreatic ductal dilation or evidence of acute inflammation. Spleen: No splenomegaly. Adrenals/Urinary Tract: Bilateral adrenal glands appear normal. No hydronephrosis. Left lower pole renal cyst is considered benign and requiring no independent imaging follow-up. Urinary bladder is unremarkable for degree of distension. Stomach/Bowel: Radiopaque enteric contrast material traverses the hepatic flexure. Stomach is minimally distended limiting evaluation. No pathologic dilation of small or large bowel. No evidence of acute bowel inflammation. Moderate volume of formed stool in the colon. Sigmoid colonic diverticulosis without findings of acute diverticulitis. Vascular/Lymphatic: Aortic  atherosclerosis. Smooth IVC contours. The portal, splenic and superior mesenteric veins are patent. No pathologically enlarged abdominal or pelvic lymph nodes. Reproductive: Status post hysterectomy. No adnexal masses. Other: Similar mild diffuse subcutaneous edema. Musculoskeletal: No aggressive lytic or blastic lesion of bone. Multilevel degenerative change of the spine. Degenerative change of the bilateral hips. IMPRESSION: 1. Similar postradiation changes in the right perihilar region. No new discrete CT evidence of local recurrence. 2. No evidence of metastatic disease in the chest, abdomen or pelvis. 3. Similar size of the moderate right and small left pleural effusions with adjacent atelectasis. 4. New multifocal patchy ground-glass opacities in the lower lobes, nonspecific and may reflect a superimposed infectious or inflammatory process including drug  reaction. Suggest attention on follow-up imaging. 5. Sigmoid colonic diverticulosis without findings of acute diverticulitis. 6.  Aortic Atherosclerosis (ICD10-I70.0). Electronically Signed   By: Maudry Mayhew M.D.   On: 04/17/2023 14:19     ASSESSMENT/PLAN:  This is a very pleasant 57 years old Caucasian female with Stage IV (T2b, N2, M1 C) non-small cell lung cancer, adenocarcinoma presented with right hilar mass in addition to mediastinal and upper abdominal lymphadenopathy in addition to bilateral adrenal metastases and metastatic disease in the musculature of the lower back.  This was diagnosed in April 2023.   The molecular studies by [employer] showed no actionable mutations and the patient has negative PD-L1 expression. She completed palliative radiotherapy to the right hilar region under the care of Dr. Mitzi Hansen.   She completed palliative radiation to the right hilar area under the care of Dr. Mitzi Hansen.   She is currently undergoing palliative systemic chemotherapy with carboplatin for an AUC of 5, Alimta 500 mg per metered squared, Keytruda 200 mg IV every 3 weeks.  She is status post 28 cycles.  Her first dose was on 08/29/2021. Starting from cycle #5, she started maintenance alimta and [place].  The patient was seen with Dr. Arbutus Ped today.  Dr. Arbutus Ped personally and independently reviewed the scan and discussed results with the patient today.  The scan showed no evidence of disease progression.  Dr. Arbutus Ped recommends she continue on the same treatment.  She is having some worsening anemia.  The patient has never had a colonoscopy.  Dr. Arbutus Ped would recommend referring her to GI for consideration of colonoscopy.  We also will arrange for her to have stool cards performed.  She is on a blood thinner with Eliquis.  Due to her worsening anemia Dr. Arbutus Ped is going to reduce her dose of Alimta to 400 mg/m starting from today cycle #29.   Labs reviewed. Her CMP is pending. As long as this  is within parameters, recommend that she proceed cycle #29 today as scheduled.    We will see her back for follow-up visit in 4 weeks for evaluation repeat blood work before undergoing cycle #30.   She a stable right pleural effusion.  She denies any worsening breathing at this time.  We can always consider thoracentesis if she has any enlarging effusion or becomes symptomatic.  I will add on iron studies, ferritin, B12, and folate to assess any vitamin deficiencies causing her anemia.  The patient was advised to call immediately if she has any concerning symptoms in the interval. The patient voices understanding of current disease status and treatment options and is in agreement with the current care plan. All questions were answered. The patient knows to call the clinic with any problems, questions or concerns. We can certainly see the patient  much sooner if necessary        Orders Placed This Encounter  Procedures   Iron and Iron Binding Capacity ([facility] only)    Standing Status:   Future    Expected Date:   05/15/2023    Expiration Date:   04/23/2024   Ferritin    Standing Status:   Future    Expected Date:   05/15/2023    Expiration Date:   04/23/2024   Vitamin B12    Standing Status:   Future    Expected Date:   05/15/2023    Expiration Date:   04/23/2024   Folate    Standing Status:   Future    Expected Date:   05/15/2023    Expiration Date:   04/23/2024   Occult blood card to lab, stool    Standing Status:   Future    Expected Date:   05/01/2023    Expiration Date:   04/23/2024   Occult blood card to lab, stool    Standing Status:   Future    Expected Date:   05/01/2023    Expiration Date:   04/23/2024   Occult blood card to lab, stool    Standing Status:   Future    Expected Date:   05/01/2023    Expiration Date:   04/23/2024   Ambulatory referral to Gastroenterology    Referral Priority:   Routine    Referral Type:   Consultation    Referral Reason:   Specialty  Services Required    Number of Visits Requested:   1     Jonny Longino L Ilyssa Grennan, PA-C 04/24/23  ADDENDUM: Hematology/Oncology Attending: I had a face-to-face encounter with the patient today.  I reviewed her record, lab, scan and recommended her care plan.  This is a very pleasant 57 years old white female with a stage IV non-small cell lung cancer, adenocarcinoma diagnosed in April 2023 with no actionable mutation and negative PD-L1 expression.  She is currently on treatment started with carboplatin, Alimta and Keytruda for 4 cycles and starting from cycle #5 she has been on maintenance Alimta and Keytruda every 3 weeks status post a total of 28 cycles.  The patient has been tolerating this treatment with no concerning adverse effect except for worsening anemia and she currently on iron supplements.  She had repeat CT scan of the chest, abdomen and pelvis performed recently.  I personally independently reviewed the scan and discussed the result with the patient today. Her scan showed no concerning findings for disease progression. I recommended for the patient to continue her current treatment with maintenance Alimta and Keytruda but we will reduce the dose of her Alimta to 400 Mg/M2 starting from cycle #29 because of the persistent anemia. The patient will come back for follow-up visit in 3 weeks for evaluation before the next cycle of her treatment. The patient was advised to call immediately if she has any other concerning symptoms in the interval. The total time spent in the appointment was 30 minutes. Disclaimer: This note was dictated with voice recognition software. Similar sounding words can inadvertently be transcribed and may be missed upon review. Lajuana Matte, MD

## 2023-04-22 ENCOUNTER — Ambulatory Visit: Payer: BC Managed Care – PPO | Admitting: Neurology

## 2023-04-22 ENCOUNTER — Encounter: Payer: Self-pay | Admitting: Neurology

## 2023-04-22 VITALS — BP 99/62 | HR 130 | Ht 60.0 in | Wt 113.0 lb

## 2023-04-22 DIAGNOSIS — Z8673 Personal history of transient ischemic attack (TIA), and cerebral infarction without residual deficits: Secondary | ICD-10-CM | POA: Diagnosis not present

## 2023-04-22 DIAGNOSIS — E7849 Other hyperlipidemia: Secondary | ICD-10-CM | POA: Diagnosis not present

## 2023-04-22 NOTE — Progress Notes (Signed)
[facility] [address] [place] [address] [phone]       OFFICE FOLLOW-UP VISIT NOTE  Ms. April Walsh Date of Birth:  [birth year] Medical Record Number:  [id number]   Referring MD: Lindie Spruce Reason for Referral: Stroke   HPI: Initial visit 12/06/2021 Ms. April Walsh is a 57 year old pleasant Caucasian lady seen today for initial office consultation visit for stroke.  She is accompanied by her aunt today.  History is obtained from them and review of electronic medical records and I have personally reviewed pertinent available imaging films in PACS.  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  She woke up on 09/11/2021 and noticed weakness in the left upper extremity mostly as well as some slurred speech and facial droop.  She presented outside time window for thrombolysis.  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  MRI scan of the brain showed small patchy acute right MCA territory infarcts involving the insular as well as posterior frontal and parietal region.  There was also a tiny punctate acute left frontal lobe infarct in addition.  2D echo showed ejection fraction of 60 to 65% without cardiac source of embolism.  Urine drug screen was positive for cannabis.  LDL cholesterol was 87 mg percent and hemoglobin A1c was 4.7.  The etiology of patient's stroke was felt to be hypercoagulability from her metastatic adenocarcinoma and she was started on Eliquis.  She denies any prior history of deep vein thrombosis, pulmonary embolism.  She had no prior history of strokes,  TIAs, seizures, migraines or other neurological problems.  She is followed by Dr. Arbutus Ped at the oncology center and has just finished 2-week course of radiation and is currently on chemotherapy.  Patient states her left hand weakness appears to have improved she still has minimum diminished fine motor skills and some numbness in the hand but otherwise is almost back to baseline.  Her slurred speech and facial droop has recovered completely.  She is tolerating Eliquis well without bleeding or bruising.  She is also tolerating Lipitor well without muscle aches and pains but has not had any follow-up lipid profile checked yet.  She has no complaints today. Update 05/07/2022 : She returns for follow-up after last visit 5 months ago.  She is doing well.  She has not had recurrent stroke or TIA symptoms.  She remains on Eliquis 2.5 mg twice daily which is tolerating well without bruising or bleeding.  Patient was supposed to have follow-up lipid profile at last visit for for unclear reasons that has not been done.  Continues to be on chemotherapy every 3 weeks and Keytruda and sees Dr. Arbutus Ped at the cancer center.  He has no new complaints today. Update 04/22/2023 ; she returns for follow-up after last visit a year ago.  She is doing well from neurovascular standpoint.  She has had no recurrent stroke or TIA symptoms.  She remains on Eliquis 2.5 twice daily which she is tolerating well with minor bruising and no bleeding episodes.  She continues to be on chemotherapy which according to her oncologist Dr. Arbutus Ped she will have to be on lifelong.  She has 7 more doses of Keytruda immunotherapy remaining.  She has no new complaints.  She has not had any follow-up lipid profile in more than a year or neurovascular imaging done. ROS:   14 system review of systems is positive for weakness, numbness, slurred speech all other systems negative  PMH:  Past Medical History:  Diagnosis Date   Anemia    Asthma     Bronchitis    Cancer (HCC)    basal cell melanoma, cervical cancer   COPD (chronic obstructive pulmonary disease) (HCC)    "early stages of COPD"   Headache    Lung cancer (HCC) 08/13/2021    Social History:  Social History   Socioeconomic History   Marital status: Married    Spouse name: Not on file   Number of children: Not on file   Years of education: Not on file   Highest education level: Not on file  Occupational History   Not on file  Tobacco Use   Smoking status: Former    Current packs/day: 0.50    Average packs/day: 0.5 packs/day for 32.0 years (16.0 ttl pk-yrs)    Types: Cigarettes    Passive exposure: Past   Smokeless tobacco: Never   Tobacco comments:    1/2 pack smoked daily ARJ, RN 08/02/21  Vaping Use   Vaping status: Never Used  Substance and Sexual Activity   Alcohol use: Never   Drug use: Never   Sexual activity: Not on file  Other Topics Concern   Not on file  Social History Narrative   Not on file   Social Determinants of Health   Financial Resource Strain: Not on file  Food Insecurity: Not on file  Transportation Needs: Not on file  Physical Activity: Not on file  Stress: Not on file  Social Connections: Not on file  Intimate Partner Violence: Not on file    Medications:   Current Outpatient Medications on File Prior to Visit  Medication Sig Dispense Refill   apixaban (ELIQUIS) 2.5 MG TABS tablet Take 1 tablet (2.5 mg total) by mouth 2 (two) times daily. 60 tablet 0   atorvastatin (LIPITOR) 80 MG tablet TAKE 1 TABLET BY MOUTH EVERY DAY 30 tablet 3   BREZTRI AEROSPHERE 160-9-4.8 MCG/ACT AERO INHALE 2 PUFFS INTO THE LUNGS IN THE MORNING AND AT BEDTIME. 10.7 each 11   folic acid (FOLVITE) 1 MG tablet TAKE 1 TABLET BY MOUTH EVERY DAY 30 tablet 2   lidocaine-prilocaine (EMLA) cream Apply 1 Application topically as needed. 30 g 2   prochlorperazine (COMPAZINE) 10 MG tablet Take 1 tablet (10 mg total) by mouth every 6 (six) hours as needed. 30  tablet 2   HYDROcodone-acetaminophen (NORCO) 5-325 MG tablet Take 1 tablet by mouth every 6 (six) hours as needed for moderate pain. (Patient not taking: Reported on 04/22/2023) 15 tablet 0   Current Facility-Administered Medications on File Prior to Visit  Medication Dose Route Frequency Provider Last Rate Last Admin   cyanocobalamin (VITAMIN B12) 1000 MCG/ML injection            prochlorperazine (COMPAZINE) 10 MG tablet             Allergies:  No Known Allergies  Physical Exam General: Frail cachectic malnourished looking middle-aged Caucasian lady, seated, in no evident distress Head: head normocephalic and atraumatic.   Neck: supple with no carotid or supraclavicular bruits Cardiovascular: regular rate and rhythm, no murmurs Musculoskeletal: no deformity Skin:  no rash/petichiae  Vascular:  Normal pulses all extremities  Neurologic Exam Mental Status: Awake and fully alert. Oriented to place and time. Recent and remote memory intact. Attention span, concentration and fund of knowledge appropriate. Mood and affect appropriate.  Cranial Nerves: Fundoscopic exam r not done. Pupils equal, briskly reactive to light. Extraocular movements full without nystagmus. Visual fields full to confrontation. Hearing intact. Facial sensation intact. Face, tongue, palate moves normally and symmetrically.  Motor: Normal bulk and tone. Normal strength in all tested extremity muscles.  Diminished fine finger movements on the left.  Orbits right over left upper extremity. Sensory.: intact to touch , pinprick , position and vibratory sensation.  Coordination: Rapid alternating movements normal in all extremities. Finger-to-nose and heel-to-shin performed accurately bilaterally. Gait and Station: Arises from chair without difficulty. Stance is normal. Gait demonstrates normal stride length and balance . Able to heel, toe and tandem walk without difficulty.  Reflexes: 1+ and symmetric. Toes downgoing.    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  Patient is doing well with very minimal residual deficits.        PLAN:I had a long d/w patient about her remote stroke, adenocarcinoma related hypercoagulability, risk for recurrent stroke/TIAs, personally independently reviewed imaging studies and stroke evaluation results and answered questions.Continue Eliquis (apixaban) 2.5 mg twice daily  for secondary stroke prevention and maintain strict control of hypertension with blood pressure goal below 130/90, diabetes with hemoglobin A1c goal below 6.5% and lipids with LDL cholesterol goal below 70 mg/dL. I also advised the patient to eat a healthy diet with plenty of whole grains, cereals, fruits and vegetables, exercise regularly and maintain ideal body weight.  Check lipid profile , HbA1c and carotid ultrasound for stroke surveillance . Followup in the future with me in 1 year or call earlier if necessary.Greater than 50% time during this 35-minute  visit was spent on counseling and coordination of care about her stroke and discussion about hypercoagulability from cancer and treatment and answering questions. Delia Heady, MD  Note: This document was prepared with digital dictation and possible smart phrase technology. Any transcriptional errors that result from this process are unintentional.

## 2023-04-22 NOTE — Patient Instructions (Signed)
I had a long d/w patient about her remote stroke, adenocarcinoma related hypercoagulability, risk for recurrent stroke/TIAs, personally independently reviewed imaging studies and stroke evaluation results and answered questions.Continue Eliquis (apixaban) 2.5 mg twice daily  for secondary stroke prevention and maintain strict control of hypertension with blood pressure goal below 130/90, diabetes with hemoglobin A1c goal below 6.5% and lipids with LDL cholesterol goal below 70 mg/dL. I also advised the patient to eat a healthy diet with plenty of whole grains, cereals, fruits and vegetables, exercise regularly and maintain ideal body weight.  Check lipid profile , HbA1c and carotid ultrasound for stroke surveillance      Followup in the future with me in 1 year or call earlier if necessary

## 2023-04-23 ENCOUNTER — Other Ambulatory Visit: Payer: Self-pay

## 2023-04-23 LAB — HEMOGLOBIN A1C
Est. average glucose Bld gHb Est-mCnc: 97 mg/dL
Hgb A1c MFr Bld: 5 % (ref 4.8–5.6)

## 2023-04-23 LAB — LIPID PANEL
Chol/HDL Ratio: 4.7 {ratio} — ABNORMAL HIGH (ref 0.0–4.4)
Cholesterol, Total: 217 mg/dL — ABNORMAL HIGH (ref 100–199)
HDL: 46 mg/dL (ref >39)
LDL Chol Calc (NIH): 144 mg/dL — ABNORMAL HIGH (ref 0–99)
Triglycerides: 151 mg/dL — ABNORMAL HIGH (ref 0–149)
VLDL Cholesterol Cal: 27 mg/dL (ref 5–40)

## 2023-04-24 ENCOUNTER — Inpatient Hospital Stay: Payer: BC Managed Care – PPO | Attending: Radiation Oncology

## 2023-04-24 ENCOUNTER — Inpatient Hospital Stay: Payer: BC Managed Care – PPO

## 2023-04-24 ENCOUNTER — Inpatient Hospital Stay: Payer: BC Managed Care – PPO | Admitting: Physician Assistant

## 2023-04-24 VITALS — HR 97

## 2023-04-24 VITALS — BP 113/78 | HR 105 | Temp 97.6°F | Resp 16 | Ht 60.0 in | Wt 115.4 lb

## 2023-04-24 DIAGNOSIS — D509 Iron deficiency anemia, unspecified: Secondary | ICD-10-CM | POA: Diagnosis not present

## 2023-04-24 DIAGNOSIS — Z95828 Presence of other vascular implants and grafts: Secondary | ICD-10-CM

## 2023-04-24 DIAGNOSIS — Z9049 Acquired absence of other specified parts of digestive tract: Secondary | ICD-10-CM | POA: Insufficient documentation

## 2023-04-24 DIAGNOSIS — Z923 Personal history of irradiation: Secondary | ICD-10-CM | POA: Insufficient documentation

## 2023-04-24 DIAGNOSIS — Z5111 Encounter for antineoplastic chemotherapy: Secondary | ICD-10-CM | POA: Insufficient documentation

## 2023-04-24 DIAGNOSIS — Z85820 Personal history of malignant melanoma of skin: Secondary | ICD-10-CM | POA: Diagnosis not present

## 2023-04-24 DIAGNOSIS — C3491 Malignant neoplasm of unspecified part of right bronchus or lung: Secondary | ICD-10-CM

## 2023-04-24 DIAGNOSIS — C7972 Secondary malignant neoplasm of left adrenal gland: Secondary | ICD-10-CM | POA: Insufficient documentation

## 2023-04-24 DIAGNOSIS — J90 Pleural effusion, not elsewhere classified: Secondary | ICD-10-CM | POA: Insufficient documentation

## 2023-04-24 DIAGNOSIS — Z9851 Tubal ligation status: Secondary | ICD-10-CM | POA: Diagnosis not present

## 2023-04-24 DIAGNOSIS — Z5112 Encounter for antineoplastic immunotherapy: Secondary | ICD-10-CM | POA: Insufficient documentation

## 2023-04-24 DIAGNOSIS — C7971 Secondary malignant neoplasm of right adrenal gland: Secondary | ICD-10-CM | POA: Diagnosis not present

## 2023-04-24 DIAGNOSIS — Z8541 Personal history of malignant neoplasm of cervix uteri: Secondary | ICD-10-CM | POA: Insufficient documentation

## 2023-04-24 DIAGNOSIS — D649 Anemia, unspecified: Secondary | ICD-10-CM | POA: Insufficient documentation

## 2023-04-24 LAB — CMP (CANCER CENTER ONLY)
ALT: 7 U/L (ref 0–44)
AST: 21 U/L (ref 15–41)
Albumin: 2.8 g/dL — ABNORMAL LOW (ref 3.5–5.0)
Alkaline Phosphatase: 64 U/L (ref 38–126)
Anion gap: 5 (ref 5–15)
BUN: 12 mg/dL (ref 6–20)
CO2: 25 mmol/L (ref 22–32)
Calcium: 8.4 mg/dL — ABNORMAL LOW (ref 8.9–10.3)
Chloride: 109 mmol/L (ref 98–111)
Creatinine: 0.63 mg/dL (ref 0.44–1.00)
GFR, Estimated: >60 mL/min (ref >60)
Glucose, Bld: 77 mg/dL (ref 70–99)
Potassium: 4.1 mmol/L (ref 3.5–5.1)
Sodium: 139 mmol/L (ref 135–145)
Total Bilirubin: 0.2 mg/dL (ref <1.2)
Total Protein: 6.1 g/dL — ABNORMAL LOW (ref 6.5–8.1)

## 2023-04-24 LAB — CBC WITH DIFFERENTIAL (CANCER CENTER ONLY)
Abs Immature Granulocytes: 0.13 10*3/uL — ABNORMAL HIGH (ref 0.00–0.07)
Basophils Absolute: 0 10*3/uL (ref 0.0–0.1)
Basophils Relative: 1 %
Eosinophils Absolute: 0.1 10*3/uL (ref 0.0–0.5)
Eosinophils Relative: 2 %
HCT: 26.4 % — ABNORMAL LOW (ref 36.0–46.0)
Hemoglobin: 8.3 g/dL — ABNORMAL LOW (ref 12.0–15.0)
Immature Granulocytes: 3 %
Lymphocytes Relative: 21 %
Lymphs Abs: 1.1 10*3/uL (ref 0.7–4.0)
MCH: 36.2 pg — ABNORMAL HIGH (ref 26.0–34.0)
MCHC: 31.4 g/dL (ref 30.0–36.0)
MCV: 115.3 fL — ABNORMAL HIGH (ref 80.0–100.0)
Monocytes Absolute: 0.9 10*3/uL (ref 0.1–1.0)
Monocytes Relative: 17 %
Neutro Abs: 3.1 10*3/uL (ref 1.7–7.7)
Neutrophils Relative %: 56 %
Platelet Count: 329 10*3/uL (ref 150–400)
RBC: 2.29 MIL/uL — ABNORMAL LOW (ref 3.87–5.11)
RDW: 21.2 % — ABNORMAL HIGH (ref 11.5–15.5)
WBC Count: 5.3 10*3/uL (ref 4.0–10.5)
nRBC: 0 % (ref 0.0–0.2)

## 2023-04-24 LAB — IRON AND IRON BINDING CAPACITY (CC-WL,HP ONLY)
Iron: 35 ug/dL (ref 28–170)
Saturation Ratios: 11 % (ref 10.4–31.8)
TIBC: 315 ug/dL (ref 250–450)
UIBC: 280 ug/dL (ref 148–442)

## 2023-04-24 LAB — FERRITIN: Ferritin: 120 ng/mL (ref 11–307)

## 2023-04-24 LAB — VITAMIN B12: Vitamin B-12: 1132 pg/mL — ABNORMAL HIGH (ref 180–914)

## 2023-04-24 LAB — FOLATE: Folate: 21.2 ng/mL (ref >5.9)

## 2023-04-24 MED ORDER — SODIUM CHLORIDE 0.9% FLUSH
10.0000 mL | INTRAVENOUS | Status: DC | PRN
  Administered 2023-04-24: 10 mL

## 2023-04-24 MED ORDER — SODIUM CHLORIDE 0.9 % IV SOLN
400.0000 mg/m2 | Freq: Once | INTRAVENOUS | Status: AC
  Administered 2023-04-24: 600 mg via INTRAVENOUS
  Filled 2023-04-24: qty 20

## 2023-04-24 MED ORDER — SODIUM CHLORIDE 0.9 % IV SOLN
200.0000 mg | Freq: Once | INTRAVENOUS | Status: AC
  Administered 2023-04-24: 200 mg via INTRAVENOUS
  Filled 2023-04-24: qty 200

## 2023-04-24 MED ORDER — PROCHLORPERAZINE MALEATE 10 MG PO TABS
10.0000 mg | ORAL_TABLET | Freq: Once | ORAL | Status: AC
  Administered 2023-04-24: 10 mg via ORAL
  Filled 2023-04-24: qty 1

## 2023-04-24 MED ORDER — SODIUM CHLORIDE 0.9% FLUSH
10.0000 mL | Freq: Once | INTRAVENOUS | Status: AC
  Administered 2023-04-24: 10 mL

## 2023-04-24 MED ORDER — SODIUM CHLORIDE 0.9 % IV SOLN: Freq: Once | INTRAVENOUS | Status: AC

## 2023-04-24 MED ORDER — HEPARIN SOD (PORK) LOCK FLUSH 100 UNIT/ML IV SOLN
500.0000 [IU] | Freq: Once | INTRAVENOUS | Status: AC | PRN
  Administered 2023-04-24: 500 [IU]

## 2023-04-24 NOTE — Patient Instructions (Signed)
 [facility] - A DEPT OF [facility]  Discharge Instructions: Thank you for choosing [facility] to provide your oncology and hematology care.   If you have a lab appointment with the Cancer Center, please go directly to the Cancer Center and check in at the registration area.   Wear comfortable clothing and clothing appropriate for easy access to any Portacath or PICC line.   We strive to give you quality time with your provider. You may need to reschedule your appointment if you arrive late (15 or more minutes).  Arriving late affects you and other patients whose appointments are after yours.  Also, if you miss three or more appointments without notifying the office, you may be dismissed from the clinic at the provider's discretion.      For prescription refill requests, have your pharmacy contact our office and allow 72 hours for refills to be completed.    Today you received the following chemotherapy and/or immunotherapy agents: Keytruda, Alimta      To help prevent nausea and vomiting after your treatment, we encourage you to take your nausea medication as directed.  BELOW ARE SYMPTOMS THAT SHOULD BE REPORTED IMMEDIATELY: *FEVER GREATER THAN 100.4 F (38 C) OR HIGHER *CHILLS OR SWEATING *NAUSEA AND VOMITING THAT IS NOT CONTROLLED WITH YOUR NAUSEA MEDICATION *UNUSUAL SHORTNESS OF BREATH *UNUSUAL BRUISING OR BLEEDING *URINARY PROBLEMS (pain or burning when urinating, or frequent urination) *BOWEL PROBLEMS (unusual diarrhea, constipation, pain near the anus) TENDERNESS IN MOUTH AND THROAT WITH OR WITHOUT PRESENCE OF ULCERS (sore throat, sores in mouth, or a toothache) UNUSUAL RASH, SWELLING OR PAIN  UNUSUAL VAGINAL DISCHARGE OR ITCHING   Items with * indicate a potential emergency and should be followed up as soon as possible or go to the Emergency Department if any problems should occur.  Please show the CHEMOTHERAPY ALERT CARD or  IMMUNOTHERAPY ALERT CARD at check-in to the Emergency Department and triage nurse.  Should you have questions after your visit or need to cancel or reschedule your appointment, please contact [facility] - A DEPT OF [facility]  Dept: [phone]  and follow the prompts.  Office hours are 8:00 a.m. to 4:30 p.m. Monday - Friday. Please note that voicemails left after 4:00 p.m. may not be returned until the following business day.  We are closed weekends and major holidays. You have access to a nurse at all times for urgent questions. Please call the main number to the clinic Dept: [phone] and follow the prompts.   For any non-urgent questions, you may also contact your provider using MyChart. We now offer e-Visits for anyone 79 and older to request care online for non-urgent symptoms. For details visit [email or web address].   Also download the MyChart app! Go to the app store, search "MyChart", open the app, select [facility], and log in with your MyChart username and password.

## 2023-04-26 ENCOUNTER — Encounter: Payer: Self-pay | Admitting: Internal Medicine

## 2023-04-26 NOTE — Progress Notes (Signed)
Kindly inform the patient that screening test for diabetes is satisfactory.  Cholesterol profile is not satisfactory and bad cholesterol is still high.  If she has been taking her Lipitor regularly she is already on the maximum dose.  If she is willing I can prescribe Repatha or Praluent injections to lower her risk for future strokes

## 2023-04-28 ENCOUNTER — Telehealth: Payer: Self-pay | Admitting: Neurology

## 2023-04-28 ENCOUNTER — Telehealth: Payer: Self-pay

## 2023-04-28 NOTE — Telephone Encounter (Signed)
I spoke with the patient and provided the results of the lab test. She verbalized understanding of the findings and expressed appreciation for the call.   She is willing to try an injectable medication to lower cholesterol. Her insurance company, [employer] prefers Repatha over [place] if a patient has an LDL over 70.

## 2023-04-28 NOTE — Telephone Encounter (Signed)
Pt states she just spoke with someone who suggest she start injections. Pt would like to hold off on the injections if she can. States she hasn't been taking her atorvastatin (LIPITOR) 80 MG tablet as she should and would like to see if she starts being consistent, if that would help instead. Requesting call back

## 2023-04-28 NOTE — Telephone Encounter (Signed)
-----   Message from Delia Heady sent at 04/26/2023 11:47 AM EST ----- Kindly inform the patient that screening test for diabetes is satisfactory.  Cholesterol profile is not satisfactory and bad cholesterol is still high.  If she has been taking her Lipitor regularly she is already on the maximum dose.  If she is willing I can prescribe Repatha or Praluent injections to lower her risk for future strokes

## 2023-04-28 NOTE — Telephone Encounter (Signed)
I have added this information to the phone note where this was discussed and will contact pt back

## 2023-04-28 NOTE — Telephone Encounter (Signed)
I called the patient and spoke with her regarding her medication. She stated she should have told Dr. Pearlean Brownie she sometimes does not remember to take her medication. She said she is scared to take injectable medications. I advised that I would inform Dr. Pearlean Brownie about inconsistent use.   We reviewed acceptable levels of cholesterol and the importance of medication adherence in preventing future strokes. I recommended that the patient set a reminder on her phone to take her medication at the same time daily.  She verbalized understanding of our conversation regarding medication adherence and her blood work. She expressed appreciation for the call. All questions answered.

## 2023-04-28 NOTE — Telephone Encounter (Signed)
"  Pt states she just spoke with someone who suggest she start injections. Pt would like to hold off on the injections if she can. States she hasn't been taking her atorvastatin (LIPITOR) 80 MG tablet as she should and would like to see if she starts being consistent, if that would help instead. Requesting call back"  Recommend that the patient make sure she is consistent in taking her atorvastatin and have her pcp follow up with lab work in the future to ensure the lab work is getting better or whether need to reassess a medication change at that time.

## 2023-04-30 DIAGNOSIS — C7972 Secondary malignant neoplasm of left adrenal gland: Secondary | ICD-10-CM | POA: Diagnosis not present

## 2023-04-30 DIAGNOSIS — C7971 Secondary malignant neoplasm of right adrenal gland: Secondary | ICD-10-CM | POA: Diagnosis not present

## 2023-04-30 DIAGNOSIS — D649 Anemia, unspecified: Secondary | ICD-10-CM | POA: Diagnosis not present

## 2023-04-30 DIAGNOSIS — C3491 Malignant neoplasm of unspecified part of right bronchus or lung: Secondary | ICD-10-CM | POA: Diagnosis not present

## 2023-04-30 DIAGNOSIS — Z5111 Encounter for antineoplastic chemotherapy: Secondary | ICD-10-CM | POA: Diagnosis not present

## 2023-04-30 DIAGNOSIS — Z923 Personal history of irradiation: Secondary | ICD-10-CM | POA: Diagnosis not present

## 2023-04-30 DIAGNOSIS — Z8541 Personal history of malignant neoplasm of cervix uteri: Secondary | ICD-10-CM | POA: Diagnosis not present

## 2023-04-30 DIAGNOSIS — J90 Pleural effusion, not elsewhere classified: Secondary | ICD-10-CM | POA: Diagnosis not present

## 2023-04-30 DIAGNOSIS — Z9049 Acquired absence of other specified parts of digestive tract: Secondary | ICD-10-CM | POA: Diagnosis not present

## 2023-04-30 DIAGNOSIS — Z85820 Personal history of malignant melanoma of skin: Secondary | ICD-10-CM | POA: Diagnosis not present

## 2023-04-30 DIAGNOSIS — Z5112 Encounter for antineoplastic immunotherapy: Secondary | ICD-10-CM | POA: Diagnosis not present

## 2023-04-30 DIAGNOSIS — Z9851 Tubal ligation status: Secondary | ICD-10-CM | POA: Diagnosis not present

## 2023-05-01 ENCOUNTER — Ambulatory Visit (HOSPITAL_COMMUNITY)
Admission: RE | Admit: 2023-05-01 | Discharge: 2023-05-01 | Disposition: A | Discharge status: Home / Self Care | Payer: BC Managed Care – PPO | Source: Ambulatory Visit | Attending: Neurology | Admitting: Neurology

## 2023-05-01 DIAGNOSIS — Z923 Personal history of irradiation: Secondary | ICD-10-CM | POA: Diagnosis not present

## 2023-05-01 DIAGNOSIS — Z8541 Personal history of malignant neoplasm of cervix uteri: Secondary | ICD-10-CM | POA: Diagnosis not present

## 2023-05-01 DIAGNOSIS — Z9851 Tubal ligation status: Secondary | ICD-10-CM | POA: Diagnosis not present

## 2023-05-01 DIAGNOSIS — C3491 Malignant neoplasm of unspecified part of right bronchus or lung: Secondary | ICD-10-CM | POA: Diagnosis not present

## 2023-05-01 DIAGNOSIS — Z9049 Acquired absence of other specified parts of digestive tract: Secondary | ICD-10-CM | POA: Diagnosis not present

## 2023-05-01 DIAGNOSIS — J90 Pleural effusion, not elsewhere classified: Secondary | ICD-10-CM | POA: Diagnosis not present

## 2023-05-01 DIAGNOSIS — D649 Anemia, unspecified: Secondary | ICD-10-CM | POA: Diagnosis not present

## 2023-05-01 DIAGNOSIS — C7972 Secondary malignant neoplasm of left adrenal gland: Secondary | ICD-10-CM | POA: Diagnosis not present

## 2023-05-01 DIAGNOSIS — Z5111 Encounter for antineoplastic chemotherapy: Secondary | ICD-10-CM | POA: Diagnosis not present

## 2023-05-01 DIAGNOSIS — Z85820 Personal history of malignant melanoma of skin: Secondary | ICD-10-CM | POA: Diagnosis not present

## 2023-05-01 DIAGNOSIS — Z8673 Personal history of transient ischemic attack (TIA), and cerebral infarction without residual deficits: Secondary | ICD-10-CM | POA: Insufficient documentation

## 2023-05-01 DIAGNOSIS — C7971 Secondary malignant neoplasm of right adrenal gland: Secondary | ICD-10-CM | POA: Diagnosis not present

## 2023-05-01 DIAGNOSIS — Z5112 Encounter for antineoplastic immunotherapy: Secondary | ICD-10-CM | POA: Diagnosis not present

## 2023-05-02 DIAGNOSIS — Z5111 Encounter for antineoplastic chemotherapy: Secondary | ICD-10-CM | POA: Diagnosis not present

## 2023-05-02 DIAGNOSIS — Z9851 Tubal ligation status: Secondary | ICD-10-CM | POA: Diagnosis not present

## 2023-05-02 DIAGNOSIS — C7972 Secondary malignant neoplasm of left adrenal gland: Secondary | ICD-10-CM | POA: Diagnosis not present

## 2023-05-02 DIAGNOSIS — Z5112 Encounter for antineoplastic immunotherapy: Secondary | ICD-10-CM | POA: Diagnosis not present

## 2023-05-02 DIAGNOSIS — Z85820 Personal history of malignant melanoma of skin: Secondary | ICD-10-CM | POA: Diagnosis not present

## 2023-05-02 DIAGNOSIS — C3491 Malignant neoplasm of unspecified part of right bronchus or lung: Secondary | ICD-10-CM | POA: Diagnosis not present

## 2023-05-02 DIAGNOSIS — J90 Pleural effusion, not elsewhere classified: Secondary | ICD-10-CM | POA: Diagnosis not present

## 2023-05-02 DIAGNOSIS — Z9049 Acquired absence of other specified parts of digestive tract: Secondary | ICD-10-CM | POA: Diagnosis not present

## 2023-05-02 DIAGNOSIS — C7971 Secondary malignant neoplasm of right adrenal gland: Secondary | ICD-10-CM | POA: Diagnosis not present

## 2023-05-02 DIAGNOSIS — D649 Anemia, unspecified: Secondary | ICD-10-CM | POA: Diagnosis not present

## 2023-05-02 DIAGNOSIS — Z923 Personal history of irradiation: Secondary | ICD-10-CM | POA: Diagnosis not present

## 2023-05-02 DIAGNOSIS — Z8541 Personal history of malignant neoplasm of cervix uteri: Secondary | ICD-10-CM | POA: Diagnosis not present

## 2023-05-09 ENCOUNTER — Other Ambulatory Visit: Payer: Self-pay

## 2023-05-09 DIAGNOSIS — D509 Iron deficiency anemia, unspecified: Secondary | ICD-10-CM

## 2023-05-09 LAB — OCCULT BLOOD X 1 CARD TO LAB, STOOL
Fecal Occult Bld: NEGATIVE
Fecal Occult Bld: NEGATIVE
Fecal Occult Bld: NEGATIVE

## 2023-05-09 NOTE — Progress Notes (Signed)
 [facility] OFFICE PROGRESS NOTE  [name], FNP [address] [place] [address]  DIAGNOSIS: Stage IV (T2b, N2, M1 C) non-small cell lung cancer, adenocarcinoma presented with right hilar mass in addition to mediastinal and upper abdominal lymphadenopathy in addition to bilateral adrenal metastases and metastatic disease in the musculature of the lower back.  This was diagnosed in April 2023.   DETECTED ALTERATION(S) / BIOMARKER(S)     % CFDNA OR AMPLIFICATION        ASSOCIATED FDA-APPROVED THERAPIES         CLINICAL TRIAL AVAILABILITY [id number] 0.5% None     Yes [id number] (Splice Site Indel) 0.6% None     Yes   PD-L1 expression 0%    PRIOR THERAPY:  Palliative radiotherapy to the right hilar region under the care of Dr. Dewey   CURRENT THERAPY:  Systemic chemotherapy with carboplatin  for AUC of 5, Alimta  500 Mg/M2 and Keytruda  200 Mg IV every 3 weeks.  First dose August 29, 2021. Status post 30 cycles.  Starting from cycle #5, the patient started maintenance Alimta  and Keytruda .  Her dose of Alimta  was reduced to 400 mg/m starting from cycle #29 due to worsening anemia.   INTERVAL HISTORY: Alania Overholt 57 y.o. female returns to the clinic today for a follow-up visit accompanied by her Aunt  The patient was last seen by Dr. Sherrod and myself 3 weeks ago.  The patient undergoes treatment with Alimta  and Keytruda .   She has not been feeling well for the last 3 weeks since her last infusion.  She is having more shortness of breath.  She has been having worsening anemia and she was started on prenatal iron and she had stool cards performed which were negative for blood.  Denies any visible bleeding.  She denies any signs of URI.  Her CT scan from earlier this month did show moderate stable right effusion and some mild inflammation.  She denies any fever.  She is coughing somewhat but it is nonproductive.  Also having some increased pitting edema in  her left upper and lower extremity.  She has been eating salty food recently.  She is also having worsening hypoabluminemia. She is wearing compression stockings.  She is on a blood thinner with Eliquis  and is compliant with this.  Denies any chest pain or hemoptysis.  Denies any night sweats.  Denies any nausea, vomiting, diarrhea, or constipation.  He denies any rashes or skin changes.  She is here today for evaluation and repeat blood work before undergoing cycle #30.  MEDICAL HISTORY: Past Medical History:  Diagnosis Date   Anemia    Asthma    Bronchitis    Cancer (HCC)    basal cell melanoma, cervical cancer   COPD (chronic obstructive pulmonary disease) (HCC)    early stages of COPD   Headache    Lung cancer (HCC) 08/13/2021    ALLERGIES:  has no known allergies.  MEDICATIONS:  Current Outpatient Medications  Medication Sig Dispense Refill   doxycycline  (VIBRA -TABS) 100 MG tablet Take 1 tablet (100 mg total) by mouth 2 (two) times daily. 20 tablet 0   methylPREDNISolone  (MEDROL  DOSEPAK) 4 MG TBPK tablet Use as instructed 21 tablet 0   apixaban  (ELIQUIS ) 2.5 MG TABS tablet Take 1 tablet (2.5 mg total) by mouth 2 (two) times daily. 60 tablet 0   atorvastatin  (LIPITOR) 80 MG tablet TAKE 1 TABLET BY MOUTH EVERY DAY 30 tablet 3   BREZTRI  AEROSPHERE  160-9-4.8 MCG/ACT AERO INHALE 2 PUFFS INTO THE LUNGS IN THE MORNING AND AT BEDTIME. 10.7 each 11   folic acid  (FOLVITE ) 1 MG tablet TAKE 1 TABLET BY MOUTH EVERY DAY 30 tablet 2   HYDROcodone -acetaminophen  (NORCO) 5-325 MG tablet Take 1 tablet by mouth every 6 (six) hours as needed for moderate pain. 15 tablet 0   lidocaine -prilocaine  (EMLA ) cream Apply 1 Application topically as needed. 30 g 2   prochlorperazine  (COMPAZINE ) 10 MG tablet Take 1 tablet (10 mg total) by mouth every 6 (six) hours as needed. 30 tablet 2   No current facility-administered medications for this visit.   Facility-Administered Medications Ordered in Other Visits   Medication Dose Route Frequency Provider Last Rate Last Admin   cyanocobalamin  (VITAMIN B12) 1000 MCG/ML injection            prochlorperazine  (COMPAZINE ) 10 MG tablet             SURGICAL HISTORY:  Past Surgical History:  Procedure Laterality Date   APPENDECTOMY     1996   BREAST BIOPSY Right    2010-2015   BRONCHIAL BIOPSY  08/13/2021   Procedure: BRONCHIAL BIOPSIES;  Surgeon: Shelah Lamar RAMAN, MD;  Location: [facility] ENDOSCOPY;  Service: Pulmonary;;   BRONCHIAL BRUSHINGS  08/13/2021   Procedure: BRONCHIAL BRUSHINGS;  Surgeon: Shelah Lamar RAMAN, MD;  Location: [facility] ENDOSCOPY;  Service: Pulmonary;;   BRONCHIAL NEEDLE ASPIRATION BIOPSY  08/13/2021   Procedure: BRONCHIAL NEEDLE ASPIRATION BIOPSIES;  Surgeon: Shelah Lamar RAMAN, MD;  Location: [facility] ENDOSCOPY;  Service: Pulmonary;;   CESAREAN SECTION     1984, 1987   FOOT SURGERY Right    2 pins and screw 1999   FOOT SURGERY Left 06/21/2021   screw and 2 pins   HEMOSTASIS CONTROL  08/13/2021   Procedure: HEMOSTASIS CONTROL;  Surgeon: Shelah Lamar RAMAN, MD;  Location: [facility] ENDOSCOPY;  Service: Pulmonary;;   IR IMAGING GUIDED PORT INSERTION  11/26/2021   MELANOMA EXCISION Left    basal cell melanoma 2011   PARTIAL HYSTERECTOMY     1994   TUBAL LIGATION     1987   VIDEO BRONCHOSCOPY  08/13/2021   Procedure: VIDEO BRONCHOSCOPY WITHOUT FLUORO;  Surgeon: Shelah Lamar RAMAN, MD;  Location: [facility] ENDOSCOPY;  Service: Pulmonary;;   VIDEO BRONCHOSCOPY WITH ENDOBRONCHIAL ULTRASOUND N/A 08/13/2021   Procedure: VIDEO BRONCHOSCOPY WITH ENDOBRONCHIAL ULTRASOUND;  Surgeon: Shelah Lamar RAMAN, MD;  Location: [facility] ENDOSCOPY;  Service: Pulmonary;  Laterality: N/A;    REVIEW OF SYSTEMS:   Review of Systems  Constitutional: Positive for fatigue. Negative for appetite change, chills, fever and unexpected weight change.  HENT:   Negative for mouth sores, nosebleeds, sore throat and trouble swallowing.   Eyes: Negative for eye problems and icterus.  Respiratory: Worsening dyspnea on  exertion and cough. Negative for hemoptysis and wheezing.   Cardiovascular: Negative for chest pain. Positive for lower extremity pitting edema.  Gastrointestinal: Negative for abdominal pain, constipation, diarrhea, nausea and vomiting.  Genitourinary: Negative for bladder incontinence, difficulty urinating, dysuria, frequency and hematuria.   Musculoskeletal: Negative for back pain, gait problem, neck pain and neck stiffness.  Skin: Negative for itching and rash.  Neurological: Negative for dizziness, extremity weakness, gait problem, headaches, light-headedness and seizures.  Hematological: Negative for adenopathy. Does not bruise/bleed easily.  Psychiatric/Behavioral: Negative for confusion, depression and sleep disturbance. The patient is not nervous/anxious.     PHYSICAL EXAMINATION:  Blood pressure 119/79, pulse (!) 108, temperature 99 F (37.2 C), temperature source Temporal, resp.  rate 17, weight 117 lb 12.8 oz (53.4 kg), SpO2 96%.  ECOG PERFORMANCE STATUS: 1  Physical Exam  Constitutional: Oriented to person, place, and time and in no distress.  HENT:  Head: Normocephalic and atraumatic.  Mouth/Throat: Oropharynx is clear and moist. No oropharyngeal exudate.  Eyes: Conjunctivae are normal. Right eye exhibits no discharge. Left eye exhibits no discharge. No scleral icterus.  Neck: Normal range of motion. Neck supple.  Cardiovascular: Normal rate, regular rhythm, normal heart sounds and intact distal pulses.   Pulmonary/Chest: Effort normal. Some crackles in the right lung. No respiratory distress. No wheezes.  Abdominal: Soft. Bowel sounds are normal. Exhibits no distension and no mass. There is no tenderness.  Musculoskeletal: Normal range of motion. Left sided pitting edema.  Lymphadenopathy:    No cervical adenopathy.  Neurological: Alert and oriented to person, place, and time. Exhibits normal muscle tone. Gait normal. Coordination normal.  Skin: Skin is warm and dry. No  rash noted. Not diaphoretic. No erythema. No pallor.  Psychiatric: Mood, memory and judgment normal.  Vitals reviewed.  LABORATORY DATA: Lab Results  Component Value Date   WBC 5.3 05/15/2023   HGB 8.2 (L) 05/15/2023   HCT 25.0 (L) 05/15/2023   MCV 117.4 (H) 05/15/2023   PLT 321 05/15/2023      Chemistry      Component Value Date/Time   NA 139 05/15/2023 0917   K 4.0 05/15/2023 0917   CL 107 05/15/2023 0917   CO2 26 05/15/2023 0917   BUN 11 05/15/2023 0917   CREATININE 0.69 05/15/2023 0917      Component Value Date/Time   CALCIUM  8.8 (L) 05/15/2023 0917   ALKPHOS 65 05/15/2023 0917   AST 17 05/15/2023 0917   ALT 6 05/15/2023 0917   BILITOT 0.2 05/15/2023 0917       RADIOGRAPHIC STUDIES:  VAS US  CAROTID Result Date: 05/02/2023 Carotid Arterial Duplex Study Patient Name:  ROMANDA TURRUBIATES  Date of Exam:   05/01/2023 Medical Rec #: [id number]      Accession #:    [id number] Date of Birth: October 31, 1965       Patient Gender: F Patient Age:   10 years Exam Location:  [facility] Procedure:      VAS US  CAROTID Referring Phys: PRAMOD SETHI --------------------------------------------------------------------------------  Indications:       Hx of CVA. Risk Factors:      Past history of smoking, prior CVA. Other Factors:     Cancer/chemotherapy. Comparison Study:  No previous exams Performing Technologist: Jody Hill RVT, RDMS  Examination Guidelines: A complete evaluation includes B-mode imaging, spectral Doppler, color Doppler, and power Doppler as needed of all accessible portions of each vessel. Bilateral testing is considered an integral part of a complete examination. Limited examinations for reoccurring indications may be performed as noted.  Right Carotid Findings: +----------+--------+--------+--------+------------------+------------------+           PSV cm/sEDV cm/sStenosisPlaque DescriptionComments            +----------+--------+--------+--------+------------------+------------------+ CCA Prox  85      27                                intimal thickening +----------+--------+--------+--------+------------------+------------------+ CCA Distal66      24              calcific          intimal thickening +----------+--------+--------+--------+------------------+------------------+ ICA Prox  65  29              heterogenous      intimal thickening +----------+--------+--------+--------+------------------+------------------+ ICA Distal79      40                                                   +----------+--------+--------+--------+------------------+------------------+ ECA       66      14                                                   +----------+--------+--------+--------+------------------+------------------+ +----------+--------+-------+----------------+-------------------+           PSV cm/sEDV cmsDescribe        Arm Pressure (mmHG) +----------+--------+-------+----------------+-------------------+ Subclavian127            Multiphasic, WNL                    +----------+--------+-------+----------------+-------------------+ +---------+--------+--+--------+--+---------+ VertebralPSV cm/s60EDV cm/s24Antegrade +---------+--------+--+--------+--+---------+  Left Carotid Findings: +----------+--------+--------+--------+------------------+------------------+           PSV cm/sEDV cm/sStenosisPlaque DescriptionComments           +----------+--------+--------+--------+------------------+------------------+ CCA Prox  91      29                                intimal thickening +----------+--------+--------+--------+------------------+------------------+ CCA Distal67      23                                                   +----------+--------+--------+--------+------------------+------------------+ ICA Prox  53      21      1-39%   calcific                              +----------+--------+--------+--------+------------------+------------------+ ICA Distal80      33                                                   +----------+--------+--------+--------+------------------+------------------+ ECA       77      12                                                   +----------+--------+--------+--------+------------------+------------------+ +----------+--------+--------+----------------+-------------------+           PSV cm/sEDV cm/sDescribe        Arm Pressure (mmHG) +----------+--------+--------+----------------+-------------------+ [id number]              Multiphasic, WNL                    +----------+--------+--------+----------------+-------------------+ +---------+--------+--+--------+--+---------+ VertebralPSV cm/s55EDV cm/s23Antegrade +---------+--------+--+--------+--+---------+   Summary: Right Carotid: The extracranial vessels were near-normal with only minimal wall  thickening or plaque. Left Carotid: Velocities in the left ICA are consistent with a 1-39% stenosis. Vertebrals:  Bilateral vertebral arteries demonstrate antegrade flow. Subclavians: Normal flow hemodynamics were seen in bilateral subclavian              arteries. *See table(s) above for measurements and observations.  Electronically signed by Eather Popp MD on 05/02/2023 at 10:54:11 AM.    Final    CT CHEST ABDOMEN PELVIS W CONTRAST Result Date: 04/17/2023 CLINICAL DATA:  History of stage IV lung cancer, restaging. * Tracking Code: BO * EXAM: CT CHEST, ABDOMEN, AND PELVIS WITH CONTRAST TECHNIQUE: Multidetector CT imaging of the chest, abdomen and pelvis was performed following the standard protocol during bolus administration of intravenous contrast. RADIATION DOSE REDUCTION: This exam was performed according to the departmental dose-optimization program which includes automated exposure control, adjustment of the mA and/or kV  according to patient size and/or use of iterative reconstruction technique. CONTRAST:  OMNIPAQUE  IOHEXOL  300 MG/ML  SOLN COMPARISON:  Multiple priors including most recent CT February 11, 2023 FINDINGS: CT CHEST FINDINGS Cardiovascular: Accessed right chest Port-A-Cath with tip in the right atrium. Aortic atherosclerosis. No central pulmonary embolus on this nondedicated study. Normal size heart. Similar trace pericardial effusion. Mediastinum/Nodes: No suspicious thyroid  nodule. No pathologically enlarged mediastinal, hilar or axillary lymph nodes. The esophagus is grossly unremarkable. Similar mild soft tissue thickening about the right hilum with stranding in the middle mediastinum. Lungs/Pleura: Right perihilar volume loss, architectural distortion and airway thickening is again seen compatible with postradiation change including a nodular component along the superior aspect measuring 2.2 x 1.4 cm on image 46/6 previously 2.5 x 1.5 cm. No new discrete suspicious nodularity in the treatment bed. No new suspicious pulmonary nodules or masses. Again seen is multifocal interlobular septal thickening with with new multifocal patchy ground-glass opacities for instance in the left lower lobe on image 74/6 and in the right lower lobe on image 56-57. Similar size of the moderate right and small left pleural effusions with adjacent atelectasis. Musculoskeletal: No aggressive lytic or blastic lesion of bone. Remote right-sided rib fractures. CT ABDOMEN PELVIS FINDINGS Hepatobiliary: No suspicious hepatic lesion. Gallbladder is unremarkable. No biliary ductal dilation. Pancreas: No pancreatic ductal dilation or evidence of acute inflammation. Spleen: No splenomegaly. Adrenals/Urinary Tract: Bilateral adrenal glands appear normal. No hydronephrosis. Left lower pole renal cyst is considered benign and requiring no independent imaging follow-up. Urinary bladder is unremarkable for degree of distension. Stomach/Bowel:  Radiopaque enteric contrast material traverses the hepatic flexure. Stomach is minimally distended limiting evaluation. No pathologic dilation of small or large bowel. No evidence of acute bowel inflammation. Moderate volume of formed stool in the colon. Sigmoid colonic diverticulosis without findings of acute diverticulitis. Vascular/Lymphatic: Aortic atherosclerosis. Smooth IVC contours. The portal, splenic and superior mesenteric veins are patent. No pathologically enlarged abdominal or pelvic lymph nodes. Reproductive: Status post hysterectomy. No adnexal masses. Other: Similar mild diffuse subcutaneous edema. Musculoskeletal: No aggressive lytic or blastic lesion of bone. Multilevel degenerative change of the spine. Degenerative change of the bilateral hips. IMPRESSION: 1. Similar postradiation changes in the right perihilar region. No new discrete CT evidence of local recurrence. 2. No evidence of metastatic disease in the chest, abdomen or pelvis. 3. Similar size of the moderate right and small left pleural effusions with adjacent atelectasis. 4. New multifocal patchy ground-glass opacities in the lower lobes, nonspecific and may reflect a superimposed infectious or inflammatory process including drug reaction. Suggest attention on follow-up imaging. 5. Sigmoid  colonic diverticulosis without findings of acute diverticulitis. 6.  Aortic Atherosclerosis (ICD10-I70.0). Electronically Signed   By: Reyes Holder M.D.   On: 04/17/2023 14:19     ASSESSMENT/PLAN:  This is a very pleasant 57 years old Caucasian female with Stage IV (T2b, N2, M1 C) non-small cell lung cancer, adenocarcinoma presented with right hilar mass in addition to mediastinal and upper abdominal lymphadenopathy in addition to bilateral adrenal metastases and metastatic disease in the musculature of the lower back.  This was diagnosed in April 2023.   The molecular studies by [employer] showed no actionable mutations and the patient has  negative PD-L1 expression. She completed palliative radiotherapy to the right hilar region under the care of Dr. Dewey.   She completed palliative radiation to the right hilar area under the care of Dr. Dewey.  She is currently undergoing palliative systemic chemotherapy with carboplatin  for an AUC of 5, Alimta  500 mg per metered squared, Keytruda  200 mg IV every 3 weeks.  She is status post 29 cycles.  Her first dose was on 08/29/2021. Starting from cycle #5, she started maintenance alimta  and keytruda .   The patient has not been feeling well since she was last seen.  Therefore the patient was seen by Dr. Sherrod.  We will arrange for 1 unit of blood due to symptomatic anemia.  Also for the inflammatory changes on her last scan we will send in a prescription for Medrol  Dosepak should this be immunotherapy mediated and doxycycline  should it be infectious.  For her swelling, Dr. Sherrod recommends monitoring for now.  We discussed using compression stockings, increasing her protein intake, elevation, and avoiding salty foods. May consider lasix  if no improvement.  Her last CT scan showed stable right pleural effusion.  Dr. Sherrod does not feel that this is large enough to be contributing to her worsening symptoms.  Therefore we will not arrange for a thoracentesis at this time.  However if the patient has any worsening symptoms I recommended that she always call us  in the interval between her appointments if she feels like she needs to be seen sooner.  I also educated her that we do offer a symptom management clinic for same-day appointments.  Should she have any worsening shortness of breath or new symptoms over the weekend, she was advised to seek emergency room evaluation.  She will not receive treatment today.  We will defer her care plan by 1 week.  We will see her back next week to see if she has improvement in her symptoms and may consider resuming treatment at that time.   She recently submitted  stool cards to the clinic which were negative for blood.  She also had iron studies, folate, and B12 testing which was negative for nutritional anemia.  The patient was advised to call immediately if she has any concerning symptoms in the interval. The patient voices understanding of current disease status and treatment options and is in agreement with the current care plan. All questions were answered. The patient knows to call the clinic with any problems, questions or concerns. We can certainly see the patient much sooner if necessary    Orders Placed This Encounter  Procedures   CBC with Differential (Cancer Center Only)    Standing Status:   Future    Expected Date:   05/22/2023    Expiration Date:   05/21/2024   CMP (Cancer Center only)    Standing Status:   Future    Expected Date:  05/22/2023    Expiration Date:   05/21/2024   T4    Standing Status:   Future    Expected Date:   05/22/2023    Expiration Date:   05/21/2024   TSH    Standing Status:   Future    Expected Date:   05/22/2023    Expiration Date:   05/21/2024   Sample to Blood Bank    Standing Status:   Future    Expected Date:   05/15/2023    Expiration Date:   05/14/2024   ABO/RH    Standing Status:   Future    Expected Date:   05/15/2023    Expiration Date:   05/14/2024   Type and screen         Standing Status:   Future    Expected Date:   05/15/2023    Expiration Date:   05/14/2024     Jermall Isaacson L Zabria Liss, PA-C 05/15/23  ADDENDUM: Hematology/Oncology Attending: I had a face-to-face encounter with the patient today.  I reviewed her records, lab and recommended her care plan.  This is a very pleasant 57 years old white female with a stage IV non-small cell lung cancer, adenocarcinoma diagnosed in April 2023 with no actionable mutations and negative PD-L1 expression.  She started systemic chemotherapy initially with carboplatin , Alimta  and Keytruda  for 4 cycles and starting from cycle #5 she has been on maintenance  treatment with Alimta  and Keytruda  every 3 weeks with a reduced dose of Alimta  at 400 Mg/M2 starting from cycle #29 secondary to worsening anemia.  The patient has been complaining of increasing fatigue and weakness as well as shortness of breath with minimal exertion and swelling in the upper and lower extremities. I recommended for the patient to delay the start of her chemo with cycle #31 until next week.  In the meantime we will treat the patient empirically with doxycycline  100 mg p.o. twice daily because of the suspicious inflammatory lesion seen on the previous imaging studies.  Will also give her a Medrol  Dosepak. For the chemotherapy-induced anemia we will arrange for the patient to receive 1 unit of PRBCs transfusion. She will come back for follow-up visit in 1 week for evaluation before resuming her treatment. The patient was advised to call immediately if she has any other concerning symptoms in the interval. The total time spent in the appointment was 30 minutes. Disclaimer: This note was dictated with voice recognition software. Similar sounding words can inadvertently be transcribed and may be missed upon review. Sherrod MARLA Sherrod, MD

## 2023-05-11 ENCOUNTER — Encounter: Payer: Self-pay | Admitting: Physician Assistant

## 2023-05-15 ENCOUNTER — Inpatient Hospital Stay: Payer: BC Managed Care – PPO

## 2023-05-15 ENCOUNTER — Inpatient Hospital Stay: Payer: BC Managed Care – PPO | Attending: Radiation Oncology

## 2023-05-15 ENCOUNTER — Inpatient Hospital Stay: Payer: BC Managed Care – PPO | Admitting: Physician Assistant

## 2023-05-15 ENCOUNTER — Other Ambulatory Visit: Payer: Self-pay

## 2023-05-15 VITALS — BP 119/79 | HR 108 | Temp 99.0°F | Resp 17 | Wt 117.8 lb

## 2023-05-15 DIAGNOSIS — Z85820 Personal history of malignant melanoma of skin: Secondary | ICD-10-CM | POA: Insufficient documentation

## 2023-05-15 DIAGNOSIS — T451X5A Adverse effect of antineoplastic and immunosuppressive drugs, initial encounter: Secondary | ICD-10-CM | POA: Insufficient documentation

## 2023-05-15 DIAGNOSIS — C3491 Malignant neoplasm of unspecified part of right bronchus or lung: Secondary | ICD-10-CM

## 2023-05-15 DIAGNOSIS — D6481 Anemia due to antineoplastic chemotherapy: Secondary | ICD-10-CM | POA: Insufficient documentation

## 2023-05-15 DIAGNOSIS — Z79620 Long term (current) use of immunosuppressive biologic: Secondary | ICD-10-CM | POA: Insufficient documentation

## 2023-05-15 DIAGNOSIS — D649 Anemia, unspecified: Secondary | ICD-10-CM

## 2023-05-15 DIAGNOSIS — Z8541 Personal history of malignant neoplasm of cervix uteri: Secondary | ICD-10-CM | POA: Insufficient documentation

## 2023-05-15 DIAGNOSIS — Z9049 Acquired absence of other specified parts of digestive tract: Secondary | ICD-10-CM | POA: Diagnosis not present

## 2023-05-15 DIAGNOSIS — C7971 Secondary malignant neoplasm of right adrenal gland: Secondary | ICD-10-CM | POA: Diagnosis not present

## 2023-05-15 DIAGNOSIS — Z5111 Encounter for antineoplastic chemotherapy: Secondary | ICD-10-CM | POA: Diagnosis not present

## 2023-05-15 DIAGNOSIS — J90 Pleural effusion, not elsewhere classified: Secondary | ICD-10-CM | POA: Insufficient documentation

## 2023-05-15 DIAGNOSIS — Z95828 Presence of other vascular implants and grafts: Secondary | ICD-10-CM

## 2023-05-15 DIAGNOSIS — Z923 Personal history of irradiation: Secondary | ICD-10-CM | POA: Insufficient documentation

## 2023-05-15 DIAGNOSIS — Z9851 Tubal ligation status: Secondary | ICD-10-CM | POA: Insufficient documentation

## 2023-05-15 DIAGNOSIS — C7972 Secondary malignant neoplasm of left adrenal gland: Secondary | ICD-10-CM | POA: Diagnosis not present

## 2023-05-15 DIAGNOSIS — Z5112 Encounter for antineoplastic immunotherapy: Secondary | ICD-10-CM | POA: Diagnosis not present

## 2023-05-15 LAB — CBC WITH DIFFERENTIAL (CANCER CENTER ONLY)
Abs Immature Granulocytes: 0.1 10*3/uL — ABNORMAL HIGH (ref 0.00–0.07)
Basophils Absolute: 0.1 10*3/uL (ref 0.0–0.1)
Basophils Relative: 1 %
Eosinophils Absolute: 0.1 10*3/uL (ref 0.0–0.5)
Eosinophils Relative: 2 %
HCT: 25 % — ABNORMAL LOW (ref 36.0–46.0)
Hemoglobin: 8.2 g/dL — ABNORMAL LOW (ref 12.0–15.0)
Immature Granulocytes: 2 %
Lymphocytes Relative: 21 %
Lymphs Abs: 1.1 10*3/uL (ref 0.7–4.0)
MCH: 38.5 pg — ABNORMAL HIGH (ref 26.0–34.0)
MCHC: 32.8 g/dL (ref 30.0–36.0)
MCV: 117.4 fL — ABNORMAL HIGH (ref 80.0–100.0)
Monocytes Absolute: 1 10*3/uL (ref 0.1–1.0)
Monocytes Relative: 19 %
Neutro Abs: 2.9 10*3/uL (ref 1.7–7.7)
Neutrophils Relative %: 55 %
Platelet Count: 321 10*3/uL (ref 150–400)
RBC: 2.13 MIL/uL — ABNORMAL LOW (ref 3.87–5.11)
RDW: 19.9 % — ABNORMAL HIGH (ref 11.5–15.5)
WBC Count: 5.3 10*3/uL (ref 4.0–10.5)
nRBC: 0 % (ref 0.0–0.2)

## 2023-05-15 LAB — CMP (CANCER CENTER ONLY)
ALT: 6 U/L (ref 0–44)
AST: 17 U/L (ref 15–41)
Albumin: 2.7 g/dL — ABNORMAL LOW (ref 3.5–5.0)
Alkaline Phosphatase: 65 U/L (ref 38–126)
Anion gap: 6 (ref 5–15)
BUN: 11 mg/dL (ref 6–20)
CO2: 26 mmol/L (ref 22–32)
Calcium: 8.8 mg/dL — ABNORMAL LOW (ref 8.9–10.3)
Chloride: 107 mmol/L (ref 98–111)
Creatinine: 0.69 mg/dL (ref 0.44–1.00)
GFR, Estimated: >60 mL/min (ref >60)
Glucose, Bld: 92 mg/dL (ref 70–99)
Potassium: 4 mmol/L (ref 3.5–5.1)
Sodium: 139 mmol/L (ref 135–145)
Total Bilirubin: 0.2 mg/dL (ref 0.0–1.2)
Total Protein: 6 g/dL — ABNORMAL LOW (ref 6.5–8.1)

## 2023-05-15 LAB — ABO/RH: ABO/RH(D): A POS

## 2023-05-15 LAB — PREPARE RBC (CROSSMATCH)

## 2023-05-15 LAB — TSH: TSH: 1.224 u[IU]/mL (ref 0.350–4.500)

## 2023-05-15 MED ORDER — SODIUM CHLORIDE 0.9% FLUSH
10.0000 mL | Freq: Once | INTRAVENOUS | Status: AC
Start: 2023-05-15 — End: 2023-05-15
  Administered 2023-05-15: 10 mL

## 2023-05-15 MED ORDER — METHYLPREDNISOLONE 4 MG PO TBPK: ORAL_TABLET | ORAL | 0 refills | Status: DC

## 2023-05-15 MED ORDER — DIPHENHYDRAMINE HCL 25 MG PO CAPS
25.0000 mg | ORAL_CAPSULE | Freq: Once | ORAL | Status: AC
  Administered 2023-05-15: 25 mg via ORAL
  Filled 2023-05-15: qty 1

## 2023-05-15 MED ORDER — HEPARIN SOD (PORK) LOCK FLUSH 100 UNIT/ML IV SOLN
500.0000 [IU] | Freq: Once | INTRAVENOUS | Status: AC
  Administered 2023-05-15: 500 [IU]

## 2023-05-15 MED ORDER — SODIUM CHLORIDE 0.9% IV SOLUTION
250.0000 mL | INTRAVENOUS | Status: DC
  Administered 2023-05-15: 250 mL via INTRAVENOUS

## 2023-05-15 MED ORDER — ACETAMINOPHEN 325 MG PO TABS
650.0000 mg | ORAL_TABLET | Freq: Once | ORAL | Status: AC
  Administered 2023-05-15: 650 mg via ORAL
  Filled 2023-05-15: qty 2

## 2023-05-15 MED ORDER — SODIUM CHLORIDE 0.9% FLUSH
10.0000 mL | Freq: Once | INTRAVENOUS | Status: AC
  Administered 2023-05-15: 10 mL

## 2023-05-15 MED ORDER — DOXYCYCLINE HYCLATE 100 MG PO TABS: 100.0000 mg | ORAL_TABLET | Freq: Two times a day (BID) | ORAL | 0 refills | Status: DC

## 2023-05-15 NOTE — Progress Notes (Signed)
 Per blood bank, unit unavailable today. Patient rescheduled for 0900 tomorrow, 05/16/2023, in [facility]. This RN made patient aware, informed pt to keep blue blood band on, pt voiced understanding. This RN de-accessed and discharged patient to lobby ambulatory with no complaints.

## 2023-05-16 ENCOUNTER — Inpatient Hospital Stay: Payer: BC Managed Care – PPO

## 2023-05-16 ENCOUNTER — Other Ambulatory Visit: Payer: Self-pay

## 2023-05-16 VITALS — BP 120/68 | HR 98 | Temp 98.1°F | Resp 16

## 2023-05-16 DIAGNOSIS — Z8541 Personal history of malignant neoplasm of cervix uteri: Secondary | ICD-10-CM | POA: Diagnosis not present

## 2023-05-16 DIAGNOSIS — Z923 Personal history of irradiation: Secondary | ICD-10-CM | POA: Diagnosis not present

## 2023-05-16 DIAGNOSIS — D649 Anemia, unspecified: Secondary | ICD-10-CM

## 2023-05-16 DIAGNOSIS — Z85820 Personal history of malignant melanoma of skin: Secondary | ICD-10-CM | POA: Diagnosis not present

## 2023-05-16 DIAGNOSIS — Z9851 Tubal ligation status: Secondary | ICD-10-CM | POA: Diagnosis not present

## 2023-05-16 DIAGNOSIS — D6481 Anemia due to antineoplastic chemotherapy: Secondary | ICD-10-CM | POA: Diagnosis not present

## 2023-05-16 DIAGNOSIS — C7972 Secondary malignant neoplasm of left adrenal gland: Secondary | ICD-10-CM | POA: Diagnosis not present

## 2023-05-16 DIAGNOSIS — C7971 Secondary malignant neoplasm of right adrenal gland: Secondary | ICD-10-CM | POA: Diagnosis not present

## 2023-05-16 DIAGNOSIS — Z79620 Long term (current) use of immunosuppressive biologic: Secondary | ICD-10-CM | POA: Diagnosis not present

## 2023-05-16 DIAGNOSIS — J90 Pleural effusion, not elsewhere classified: Secondary | ICD-10-CM | POA: Diagnosis not present

## 2023-05-16 DIAGNOSIS — Z5112 Encounter for antineoplastic immunotherapy: Secondary | ICD-10-CM | POA: Diagnosis not present

## 2023-05-16 DIAGNOSIS — Z9049 Acquired absence of other specified parts of digestive tract: Secondary | ICD-10-CM | POA: Diagnosis not present

## 2023-05-16 DIAGNOSIS — T451X5A Adverse effect of antineoplastic and immunosuppressive drugs, initial encounter: Secondary | ICD-10-CM | POA: Diagnosis not present

## 2023-05-16 DIAGNOSIS — C3491 Malignant neoplasm of unspecified part of right bronchus or lung: Secondary | ICD-10-CM | POA: Diagnosis not present

## 2023-05-16 DIAGNOSIS — Z5111 Encounter for antineoplastic chemotherapy: Secondary | ICD-10-CM | POA: Diagnosis not present

## 2023-05-16 LAB — T4: T4, Total: 7.4 ug/dL (ref 4.5–12.0)

## 2023-05-16 MED ORDER — HEPARIN SOD (PORK) LOCK FLUSH 100 UNIT/ML IV SOLN
500.0000 [IU] | Freq: Every day | INTRAVENOUS | Status: DC | PRN
Start: 2023-05-16 — End: 2023-05-16

## 2023-05-16 MED ORDER — SODIUM CHLORIDE 0.9% FLUSH
10.0000 mL | INTRAVENOUS | Status: DC | PRN
Start: 2023-05-16 — End: 2023-05-16

## 2023-05-16 MED ORDER — LORATADINE 10 MG PO TABS
10.0000 mg | ORAL_TABLET | Freq: Once | ORAL | Status: AC
Start: 2023-05-16 — End: 2023-05-16
  Administered 2023-05-16: 10 mg via ORAL
  Filled 2023-05-16: qty 1

## 2023-05-16 MED ORDER — DIPHENHYDRAMINE HCL 25 MG PO CAPS: 25.0000 mg | ORAL_CAPSULE | Freq: Once | ORAL | Status: DC

## 2023-05-16 MED ORDER — ACETAMINOPHEN 325 MG PO TABS
650.0000 mg | ORAL_TABLET | Freq: Once | ORAL | Status: AC
  Administered 2023-05-16: 650 mg via ORAL
  Filled 2023-05-16: qty 2

## 2023-05-16 MED ORDER — SODIUM CHLORIDE 0.9% IV SOLUTION
250.0000 mL | INTRAVENOUS | Status: DC
  Administered 2023-05-16: 100 mL via INTRAVENOUS

## 2023-05-16 NOTE — Patient Instructions (Signed)

## 2023-05-19 LAB — TYPE AND SCREEN
ABO/RH(D): A POS
Antibody Screen: POSITIVE
Donor AG Type: NEGATIVE
PT AG Type: NEGATIVE
Unit division: 0

## 2023-05-19 LAB — BPAM RBC
Blood Product Expiration Date: 202501302359
ISSUE DATE / TIME: 202501030941
Unit Type and Rh: 6200

## 2023-05-22 ENCOUNTER — Other Ambulatory Visit: Payer: Self-pay | Admitting: Internal Medicine

## 2023-05-22 ENCOUNTER — Inpatient Hospital Stay: Payer: BC Managed Care – PPO

## 2023-05-22 ENCOUNTER — Encounter: Payer: Self-pay | Admitting: Internal Medicine

## 2023-05-22 ENCOUNTER — Inpatient Hospital Stay: Payer: BC Managed Care – PPO | Admitting: Internal Medicine

## 2023-05-22 VITALS — BP 139/77 | HR 88 | Temp 97.5°F | Resp 18 | Ht 60.0 in | Wt 121.2 lb

## 2023-05-22 VITALS — BP 127/78 | HR 98 | Resp 16

## 2023-05-22 DIAGNOSIS — J90 Pleural effusion, not elsewhere classified: Secondary | ICD-10-CM | POA: Diagnosis not present

## 2023-05-22 DIAGNOSIS — C3491 Malignant neoplasm of unspecified part of right bronchus or lung: Secondary | ICD-10-CM | POA: Diagnosis not present

## 2023-05-22 DIAGNOSIS — Z95828 Presence of other vascular implants and grafts: Secondary | ICD-10-CM

## 2023-05-22 DIAGNOSIS — C7971 Secondary malignant neoplasm of right adrenal gland: Secondary | ICD-10-CM | POA: Diagnosis not present

## 2023-05-22 DIAGNOSIS — Z9851 Tubal ligation status: Secondary | ICD-10-CM | POA: Diagnosis not present

## 2023-05-22 DIAGNOSIS — Z79620 Long term (current) use of immunosuppressive biologic: Secondary | ICD-10-CM | POA: Diagnosis not present

## 2023-05-22 DIAGNOSIS — Z85820 Personal history of malignant melanoma of skin: Secondary | ICD-10-CM | POA: Diagnosis not present

## 2023-05-22 DIAGNOSIS — T451X5A Adverse effect of antineoplastic and immunosuppressive drugs, initial encounter: Secondary | ICD-10-CM | POA: Diagnosis not present

## 2023-05-22 DIAGNOSIS — Z9049 Acquired absence of other specified parts of digestive tract: Secondary | ICD-10-CM | POA: Diagnosis not present

## 2023-05-22 DIAGNOSIS — Z5112 Encounter for antineoplastic immunotherapy: Secondary | ICD-10-CM | POA: Diagnosis not present

## 2023-05-22 DIAGNOSIS — Z5111 Encounter for antineoplastic chemotherapy: Secondary | ICD-10-CM | POA: Diagnosis not present

## 2023-05-22 DIAGNOSIS — Z923 Personal history of irradiation: Secondary | ICD-10-CM | POA: Diagnosis not present

## 2023-05-22 DIAGNOSIS — Z8541 Personal history of malignant neoplasm of cervix uteri: Secondary | ICD-10-CM | POA: Diagnosis not present

## 2023-05-22 DIAGNOSIS — C7972 Secondary malignant neoplasm of left adrenal gland: Secondary | ICD-10-CM | POA: Diagnosis not present

## 2023-05-22 DIAGNOSIS — D6481 Anemia due to antineoplastic chemotherapy: Secondary | ICD-10-CM | POA: Diagnosis not present

## 2023-05-22 DIAGNOSIS — D649 Anemia, unspecified: Secondary | ICD-10-CM

## 2023-05-22 LAB — CMP (CANCER CENTER ONLY)
ALT: 15 U/L (ref 0–44)
AST: 28 U/L (ref 15–41)
Albumin: 2.9 g/dL — ABNORMAL LOW (ref 3.5–5.0)
Alkaline Phosphatase: 65 U/L (ref 38–126)
Anion gap: 6 (ref 5–15)
BUN: 24 mg/dL — ABNORMAL HIGH (ref 6–20)
CO2: 26 mmol/L (ref 22–32)
Calcium: 8.9 mg/dL (ref 8.9–10.3)
Chloride: 107 mmol/L (ref 98–111)
Creatinine: 0.64 mg/dL (ref 0.44–1.00)
GFR, Estimated: >60 mL/min (ref >60)
Glucose, Bld: 67 mg/dL — ABNORMAL LOW (ref 70–99)
Potassium: 3.6 mmol/L (ref 3.5–5.1)
Sodium: 139 mmol/L (ref 135–145)
Total Bilirubin: 0.2 mg/dL (ref 0.0–1.2)
Total Protein: 6.1 g/dL — ABNORMAL LOW (ref 6.5–8.1)

## 2023-05-22 LAB — CBC WITH DIFFERENTIAL (CANCER CENTER ONLY)
Abs Immature Granulocytes: 0.07 10*3/uL (ref 0.00–0.07)
Basophils Absolute: 0.1 10*3/uL (ref 0.0–0.1)
Basophils Relative: 1 %
Eosinophils Absolute: 0.1 10*3/uL (ref 0.0–0.5)
Eosinophils Relative: 1 %
HCT: 34.1 % — ABNORMAL LOW (ref 36.0–46.0)
Hemoglobin: 11 g/dL — ABNORMAL LOW (ref 12.0–15.0)
Immature Granulocytes: 1 %
Lymphocytes Relative: 21 %
Lymphs Abs: 1.8 10*3/uL (ref 0.7–4.0)
MCH: 36.2 pg — ABNORMAL HIGH (ref 26.0–34.0)
MCHC: 32.3 g/dL (ref 30.0–36.0)
MCV: 112.2 fL — ABNORMAL HIGH (ref 80.0–100.0)
Monocytes Absolute: 1.4 10*3/uL — ABNORMAL HIGH (ref 0.1–1.0)
Monocytes Relative: 17 %
Neutro Abs: 5.2 10*3/uL (ref 1.7–7.7)
Neutrophils Relative %: 59 %
Platelet Count: 289 10*3/uL (ref 150–400)
RBC: 3.04 MIL/uL — ABNORMAL LOW (ref 3.87–5.11)
RDW: 19.3 % — ABNORMAL HIGH (ref 11.5–15.5)
WBC Count: 8.6 10*3/uL (ref 4.0–10.5)
nRBC: 0 % (ref 0.0–0.2)

## 2023-05-22 LAB — TSH: TSH: 1.436 u[IU]/mL (ref 0.350–4.500)

## 2023-05-22 LAB — SAMPLE TO BLOOD BANK

## 2023-05-22 MED ORDER — CYANOCOBALAMIN 1000 MCG/ML IJ SOLN
1000.0000 ug | Freq: Once | INTRAMUSCULAR | Status: AC
Start: 2023-05-22 — End: 2023-05-22
  Administered 2023-05-22: 1000 ug via INTRAMUSCULAR
  Filled 2023-05-22: qty 1

## 2023-05-22 MED ORDER — SODIUM CHLORIDE 0.9 % IV SOLN
400.0000 mg/m2 | Freq: Once | INTRAVENOUS | Status: AC
  Administered 2023-05-22: 600 mg via INTRAVENOUS
  Filled 2023-05-22: qty 20

## 2023-05-22 MED ORDER — SODIUM CHLORIDE 0.9% FLUSH
10.0000 mL | INTRAVENOUS | Status: DC | PRN
Start: 2023-05-22 — End: 2023-05-22
  Administered 2023-05-22: 10 mL

## 2023-05-22 MED ORDER — PEMBROLIZUMAB CHEMO INJECTION 100 MG/4ML
200.0000 mg | Freq: Once | INTRAVENOUS | Status: AC
  Administered 2023-05-22: 200 mg via INTRAVENOUS
  Filled 2023-05-22: qty 200

## 2023-05-22 MED ORDER — HEPARIN SOD (PORK) LOCK FLUSH 100 UNIT/ML IV SOLN
500.0000 [IU] | Freq: Once | INTRAVENOUS | Status: AC | PRN
Start: 2023-05-22 — End: 2023-05-22
  Administered 2023-05-22: 500 [IU]

## 2023-05-22 MED ORDER — HEPARIN SOD (PORK) LOCK FLUSH 100 UNIT/ML IV SOLN
500.0000 [IU] | Freq: Once | INTRAVENOUS | Status: AC
  Administered 2023-05-22: 500 [IU]

## 2023-05-22 MED ORDER — PROCHLORPERAZINE MALEATE 10 MG PO TABS
10.0000 mg | ORAL_TABLET | Freq: Once | ORAL | Status: AC
  Administered 2023-05-22: 10 mg via ORAL
  Filled 2023-05-22: qty 1

## 2023-05-22 MED ORDER — SODIUM CHLORIDE 0.9% FLUSH
10.0000 mL | Freq: Once | INTRAVENOUS | Status: AC
  Administered 2023-05-22: 10 mL

## 2023-05-22 MED ORDER — SODIUM CHLORIDE 0.9 % IV SOLN: Freq: Once | INTRAVENOUS | Status: AC

## 2023-05-22 NOTE — Patient Instructions (Signed)
 [facility] - A DEPT OF [facility]  Discharge Instructions: Thank you for choosing [facility] to provide your oncology and hematology care.   If you have a lab appointment with the Cancer Center, please go directly to the Cancer Center and check in at the registration area.   Wear comfortable clothing and clothing appropriate for easy access to any Portacath or PICC line.   We strive to give you quality time with your provider. You may need to reschedule your appointment if you arrive late (15 or more minutes).  Arriving late affects you and other patients whose appointments are after yours.  Also, if you miss three or more appointments without notifying the office, you may be dismissed from the clinic at the provider's discretion.      For prescription refill requests, have your pharmacy contact our office and allow 72 hours for refills to be completed.    Today you received the following chemotherapy and/or immunotherapy agents: Keytruda, Alimta.       To help prevent nausea and vomiting after your treatment, we encourage you to take your nausea medication as directed.  BELOW ARE SYMPTOMS THAT SHOULD BE REPORTED IMMEDIATELY: *FEVER GREATER THAN 100.4 F (38 C) OR HIGHER *CHILLS OR SWEATING *NAUSEA AND VOMITING THAT IS NOT CONTROLLED WITH YOUR NAUSEA MEDICATION *UNUSUAL SHORTNESS OF BREATH *UNUSUAL BRUISING OR BLEEDING *URINARY PROBLEMS (pain or burning when urinating, or frequent urination) *BOWEL PROBLEMS (unusual diarrhea, constipation, pain near the anus) TENDERNESS IN MOUTH AND THROAT WITH OR WITHOUT PRESENCE OF ULCERS (sore throat, sores in mouth, or a toothache) UNUSUAL RASH, SWELLING OR PAIN  UNUSUAL VAGINAL DISCHARGE OR ITCHING   Items with * indicate a potential emergency and should be followed up as soon as possible or go to the Emergency Department if any problems should occur.  Please show the CHEMOTHERAPY ALERT CARD or  IMMUNOTHERAPY ALERT CARD at check-in to the Emergency Department and triage nurse.  Should you have questions after your visit or need to cancel or reschedule your appointment, please contact [facility] - A DEPT OF [facility]  Dept: [phone]  and follow the prompts.  Office hours are 8:00 a.m. to 4:30 p.m. Monday - Friday. Please note that voicemails left after 4:00 p.m. may not be returned until the following business day.  We are closed weekends and major holidays. You have access to a nurse at all times for urgent questions. Please call the main number to the clinic Dept: [phone] and follow the prompts.   For any non-urgent questions, you may also contact your provider using MyChart. We now offer e-Visits for anyone 61 and older to request care online for non-urgent symptoms. For details visit [email or web address].   Also download the MyChart app! Go to the app store, search "MyChart", open the app, select , and log in with your MyChart username and password.

## 2023-05-22 NOTE — Progress Notes (Signed)
 [facility] [phone]   [phone]  OFFICE PROGRESS NOTE  [name], FNP [address] [place] [address]  DIAGNOSIS: Stage IV (T2b, N2, M1 C) non-small cell lung cancer, adenocarcinoma presented with right hilar mass in addition to mediastinal and upper abdominal lymphadenopathy in addition to bilateral adrenal metastases and metastatic disease in the musculature of the lower back.  This was diagnosed in April 2023.  DETECTED ALTERATION(S) / BIOMARKER(S) % CFDNA OR AMPLIFICATION ASSOCIATED FDA-APPROVED THERAPIES CLINICAL TRIAL AVAILABILITY [id number] 0.5% None  Yes [id number] (Splice Site Indel) 0.6% None  Yes  PD-L1 expression 0%  PRIOR THERAPY: Palliative radiotherapy to the right hilar region under the care of Dr. Dewey.  CURRENT THERAPY:  systemic chemotherapy with carboplatin  for AUC of 5, Alimta  500 Mg/M2 and Keytruda  200 Mg IV every 3 weeks.  First dose August 29, 2021.  Status post 29 cycles.  Starting from cycle #5 the patient is on maintenance treatment with Alimta  and Keytruda  every 3 weeks.  INTERVAL HISTORY: April Walsh 58 y.o. female returns to the clinic today for follow-up visit.Discussed the use of AI scribe software for clinical note transcription with the patient, who gave verbal consent to proceed.  History of Present Illness   The patient, a 58 year old with a history of adenocarcinoma, has been undergoing chemotherapy since 2023. She has received four doses of Carboplatin , Alimta , and Keytruda , followed by a maintenance regimen of Alimta  and Keytruda  every three weeks. She has completed 29 cycles of this treatment.  The patient was due for her 30th cycle last week but was unable to receive it due to feeling unwell. She experienced significant fatigue and sinusitis, for which she was prescribed antibiotics and a Medrol  dose pack. She also received a blood transfusion due to anemia.  Today, the  patient reports feeling significantly better than the previous week, with no residual symptoms or complaints. However, she expresses a desire for improved respiratory function, as she continues to experience shortness of breath. She denies any chest pain, hemoptysis, or productive cough. The patient also reports an improvement in fatigue following the blood transfusion.       MEDICAL HISTORY: Past Medical History:  Diagnosis Date   Anemia    Asthma    Bronchitis    Cancer (HCC)    basal cell melanoma, cervical cancer   COPD (chronic obstructive pulmonary disease) (HCC)    early stages of COPD   Headache    Lung cancer (HCC) 08/13/2021    ALLERGIES:  has no known allergies.  MEDICATIONS:  Current Outpatient Medications  Medication Sig Dispense Refill   apixaban  (ELIQUIS ) 2.5 MG TABS tablet Take 1 tablet (2.5 mg total) by mouth 2 (two) times daily. 60 tablet 0   atorvastatin  (LIPITOR) 80 MG tablet TAKE 1 TABLET BY MOUTH EVERY DAY 30 tablet 3   BREZTRI  AEROSPHERE 160-9-4.8 MCG/ACT AERO INHALE 2 PUFFS INTO THE LUNGS IN THE MORNING AND AT BEDTIME. 10.7 each 11   doxycycline  (VIBRA -TABS) 100 MG tablet Take 1 tablet (100 mg total) by mouth 2 (two) times daily. 20 tablet 0   folic acid  (FOLVITE ) 1 MG tablet TAKE 1 TABLET BY MOUTH EVERY DAY 30 tablet 2   HYDROcodone -acetaminophen  (NORCO) 5-325 MG tablet Take 1 tablet by mouth every 6 (six) hours as needed for moderate pain. 15 tablet 0   lidocaine -prilocaine  (EMLA ) cream Apply 1 Application topically as needed. 30 g 2   methylPREDNISolone  (MEDROL  DOSEPAK) 4 MG  TBPK tablet Use as instructed 21 tablet 0   prochlorperazine  (COMPAZINE ) 10 MG tablet Take 1 tablet (10 mg total) by mouth every 6 (six) hours as needed. 30 tablet 2   No current facility-administered medications for this visit.   Facility-Administered Medications Ordered in Other Visits  Medication Dose Route Frequency Provider Last Rate Last Admin   cyanocobalamin  (VITAMIN B12)  1000 MCG/ML injection            prochlorperazine  (COMPAZINE ) 10 MG tablet             SURGICAL HISTORY:  Past Surgical History:  Procedure Laterality Date   APPENDECTOMY     1996   BREAST BIOPSY Right    2010-2015   BRONCHIAL BIOPSY  08/13/2021   Procedure: BRONCHIAL BIOPSIES;  Surgeon: Shelah Lamar RAMAN, MD;  Location: [facility] ENDOSCOPY;  Service: Pulmonary;;   BRONCHIAL BRUSHINGS  08/13/2021   Procedure: BRONCHIAL BRUSHINGS;  Surgeon: Shelah Lamar RAMAN, MD;  Location: [facility] ENDOSCOPY;  Service: Pulmonary;;   BRONCHIAL NEEDLE ASPIRATION BIOPSY  08/13/2021   Procedure: BRONCHIAL NEEDLE ASPIRATION BIOPSIES;  Surgeon: Shelah Lamar RAMAN, MD;  Location: [facility] ENDOSCOPY;  Service: Pulmonary;;   CESAREAN SECTION     1984, 1987   FOOT SURGERY Right    2 pins and screw 1999   FOOT SURGERY Left 06/21/2021   screw and 2 pins   HEMOSTASIS CONTROL  08/13/2021   Procedure: HEMOSTASIS CONTROL;  Surgeon: Shelah Lamar RAMAN, MD;  Location: [facility] ENDOSCOPY;  Service: Pulmonary;;   IR IMAGING GUIDED PORT INSERTION  11/26/2021   MELANOMA EXCISION Left    basal cell melanoma 2011   PARTIAL HYSTERECTOMY     1994   TUBAL LIGATION     1987   VIDEO BRONCHOSCOPY  08/13/2021   Procedure: VIDEO BRONCHOSCOPY WITHOUT FLUORO;  Surgeon: Shelah Lamar RAMAN, MD;  Location[facility] END[facility]COPY;  Service: Pulmonary;;   VIDEO BRONCHOSCOPY WITH ENDOBRONCHIAL ULTRASOUND N/A 08/13/2021   Procedure: VIDEO BRONCHOSCOPY WITH ENDOBRONCHIAL ULTRASOUND;  Surgeon: Shelah Lamar RAMAN, MD;  Location: [facility] ENDOSCOPY;  Service: Pulmonary;  Laterality: N/A;    REVIEW OF SYSTEMS:  A comprehensive review of systems was negative except for: Constitutional: positive for fatigue Respiratory: positive for dyspnea on exertion   PHYSICAL EXAMINATION: General appearance: alert, cooperative, and no distress Head: Normocephalic, without obvious abnormality, atraumatic Neck: no adenopathy, no JVD, supple, symmetrical, trachea midline, and thyroid  not enlarged, symmetric, no  tenderness/mass/nodules Lymph nodes: Cervical, supraclavicular, and axillary nodes normal. Resp: clear to auscultation bilaterally Back: symmetric, no curvature. ROM normal. No CVA tenderness. Cardio: regular rate and rhythm, S1, S2 normal, no murmur, click, rub or gallop GI: soft, non-tender; bowel sounds normal; no masses,  no organomegaly Extremities: extremities normal, atraumatic, no cyanosis or edema  ECOG PERFORMANCE STATUS: 1 - Symptomatic but completely ambulatory  Blood pressure 139/77, pulse 88, temperature (!) 97.5 F (36.4 C), temperature source Temporal, resp. rate 18, height 5' (1.524 m), weight 121 lb 3.2 oz (55 kg), SpO2 99%.  LABORATORY DATA: Lab Results  Component Value Date   WBC 8.6 05/22/2023   HGB 11.0 (L) 05/22/2023   HCT 34.1 (L) 05/22/2023   MCV 112.2 (H) 05/22/2023   PLT 289 05/22/2023      Chemistry      Component Value Date/Time   NA 139 05/15/2023 0917   K 4.0 05/15/2023 0917   CL 107 05/15/2023 0917   CO2 26 05/15/2023 0917   BUN 11 05/15/2023 0917   CREATININE 0.69 05/15/2023 9082  Component Value Date/Time   CALCIUM  8.8 (L) 05/15/2023 0917   ALKPHOS 65 05/15/2023 0917   AST 17 05/15/2023 0917   ALT 6 05/15/2023 0917   BILITOT 0.2 05/15/2023 0917       RADIOGRAPHIC STUDIES: VAS US  CAROTID Result Date: 05/02/2023 Carotid Arterial Duplex Study Patient Name:  JACQULINE TERRILL  Date of Exam:   05/01/2023 Medical Rec #: [id number]      Accession #:    [id number] Date of Birth: 1965-10-25       Patient Gender: F Patient Age:   32 years Exam Location:  [facility] Procedure:      VAS US  CAROTID Referring Phys: PRAMOD SETHI --------------------------------------------------------------------------------  Indications:       Hx of CVA. Risk Factors:      Past history of smoking, prior CVA. Other Factors:     Cancer/chemotherapy. Comparison Study:  No previous exams Performing Technologist: Jody Hill RVT, RDMS  Examination Guidelines: A  complete evaluation includes B-mode imaging, spectral Doppler, color Doppler, and power Doppler as needed of all accessible portions of each vessel. Bilateral testing is considered an integral part of a complete examination. Limited examinations for reoccurring indications may be performed as noted.  Right Carotid Findings: +----------+--------+--------+--------+------------------+------------------+           PSV cm/sEDV cm/sStenosisPlaque DescriptionComments           +----------+--------+--------+--------+------------------+------------------+ CCA Prox  85      27                                intimal thickening +----------+--------+--------+--------+------------------+------------------+ CCA Distal66      24              calcific          intimal thickening +----------+--------+--------+--------+------------------+------------------+ ICA Prox  65      29              heterogenous      intimal thickening +----------+--------+--------+--------+------------------+------------------+ ICA Distal79      40                                                   +----------+--------+--------+--------+------------------+------------------+ ECA       66      14                                                   +----------+--------+--------+--------+------------------+------------------+ +----------+--------+-------+----------------+-------------------+           PSV cm/sEDV cmsDescribe        Arm Pressure (mmHG) +----------+--------+-------+----------------+-------------------+ Subclavian127            Multiphasic, WNL                    +----------+--------+-------+----------------+-------------------+ +---------+--------+--+--------+--+---------+ VertebralPSV cm/s60EDV cm/s24Antegrade +---------+--------+--+--------+--+---------+  Left Carotid Findings: +----------+--------+--------+--------+------------------+------------------+           PSV cm/sEDV  cm/sStenosisPlaque DescriptionComments           +----------+--------+--------+--------+------------------+------------------+ CCA Prox  91      29  intimal thickening +----------+--------+--------+--------+------------------+------------------+ CCA Distal67      23                                                   +----------+--------+--------+--------+------------------+------------------+ ICA Prox  53      21      1-39%   calcific                             +----------+--------+--------+--------+------------------+------------------+ ICA Distal80      33                                                   +----------+--------+--------+--------+------------------+------------------+ ECA       77      12                                                   +----------+--------+--------+--------+------------------+------------------+ +----------+--------+--------+----------------+-------------------+           PSV cm/sEDV cm/sDescribe        Arm Pressure (mmHG) +----------+--------+--------+----------------+-------------------+ [id number]              Multiphasic, WNL                    +----------+--------+--------+----------------+-------------------+ +---------+--------+--+--------+--+---------+ VertebralPSV cm/s55EDV cm/s23Antegrade +---------+--------+--+--------+--+---------+   Summary: Right Carotid: The extracranial vessels were near-normal with only minimal wall                thickening or plaque. Left Carotid: Velocities in the left ICA are consistent with a 1-39% stenosis. Vertebrals:  Bilateral vertebral arteries demonstrate antegrade flow. Subclavians: Normal flow hemodynamics were seen in bilateral subclavian              arteries. *See table(s) above for measurements and observations.  Electronically signed by Eather Popp MD on 05/02/2023 at 10:54:11 AM.    Final     ASSESSMENT AND PLAN: This is a very pleasant  58 years old white female with Stage IV (T2b, N2, M1 C) non-small cell lung cancer, adenocarcinoma presented with right hilar mass in addition to mediastinal and upper abdominal lymphadenopathy in addition to bilateral adrenal metastases and metastatic disease in the musculature of the lower back.  This was diagnosed in April 2023. The molecular studies by [employer] showed no actionable mutations and the patient has negative PD-L1 expression. She is currently undergoing palliative radiotherapy to the right hilar region under the care of Dr. Dewey. The patient is currently on systemic chemotherapy started with carboplatin  for AUC of 5, Alimta  500 Mg/M2 and Keytruda  200 Mg IV every 3 weeks and starting from cycle #5 she is on maintenance treatment with Alimta  and Keytruda  every 3 weeks.  Status post a total of 29 cycles.  The patient has been tolerating her treatment fairly well.    Adenocarcinoma Diagnosed in 2023. Underwent chemotherapy with carboplatin , pemetrexed  (Alimta ), and pembrolizumab  (Keytruda ). Currently on maintenance therapy with Alimta  and Keytruda  every three weeks. Today is cycle number 30. Missed last week's treatment due to fatigue, dyspnea, and infection.  Symptoms improved after doxycycline  and Medrol  dose pack. Hemoglobin improved from 8 to over 11. Prefers infusion today due to anticipated snow tomorrow. - Proceed with cycle number 30 of Alimta  and Keytruda  infusion - Check with nurse to accommodate infusion today - If not possible, schedule infusion for tomorrow  Anemia Secondary to chemotherapy. Hemoglobin improved from 8 to over 11 after recent transfusion. Reports significant improvement. - Monitor hemoglobin levels regularly - Continue supportive care as needed  Infection Recent infection treated with doxycycline  and Medrol  dose pack. Symptoms of fatigue and dyspnea improved. - Monitor for signs of recurrent infection - Continue current medications as  prescribed  Follow-up - Check with nurse for same-day infusion - If not possible, schedule for tomorrow.   The patient was advised to call immediately if she has any concerning symptoms in the interval.  The patient voices understanding of current disease status and treatment options and is in agreement with the current care plan. All questions were answered. The patient knows to call the clinic with any problems, questions or concerns. We can certainly see the patient much sooner if necessary.  The total time spent in the appointment was 20 minutes.  Disclaimer: This note was dictated with voice recognition software. Similar sounding words can inadvertently be transcribed and may not be corrected upon review.

## 2023-05-23 ENCOUNTER — Inpatient Hospital Stay: Payer: BC Managed Care – PPO

## 2023-05-24 LAB — T4: T4, Total: 8.1 ug/dL (ref 4.5–12.0)

## 2023-06-04 NOTE — Progress Notes (Signed)
April Walsh OFFICE PROGRESS NOTE  April Mangle, FNP No address on file  DIAGNOSIS: Stage IV (T2b, N2, M1 C) non-small cell lung cancer, adenocarcinoma presented with right hilar mass in addition to mediastinal and upper abdominal lymphadenopathy in addition to bilateral adrenal metastases and metastatic disease in the musculature of the lower back.  This was diagnosed in April 2023.   DETECTED ALTERATION(S) / BIOMARKER(S)     % CFDNA OR AMPLIFICATION        ASSOCIATED FDA-APPROVED THERAPIES         CLINICAL TRIAL AVAILABILITY [id number] 0.5% None     Yes [id number] (Splice Site Indel) 0.6% None     Yes   PD-L1 expression 0%  PRIOR THERAPY: Palliative radiotherapy to the right hilar region under the care of Dr. Mitzi Hansen   CURRENT THERAPY: Systemic chemotherapy with carboplatin for AUC of 5, Alimta 500 Mg/M2 and Keytruda 200 Mg IV every 3 weeks.  First dose August 29, 2021. Status post 30 cycles.  Starting from cycle #5, the patient started maintenance Alimta and Keytruda.  Her dose of Alimta was reduced to 400 mg/m starting from cycle #29 due to worsening anemia.   INTERVAL HISTORY: April Walsh 58 y.o. female returns to the clinic today for a follow-up visit. The patient was last seen by Dr. Arbutus Ped 3 weeks ago. The patient undergoes treatment with Alimta and Keytruda and she tolerates it well without any major concerning adverse side effects but with the more cumulative doses she has more fatigue and cytopenias. When she was seen on 05/15/23, she was feeling unwell. She was treated with blood transfusion and steriods and antibitoics and was delayed by 1 week. She felt a lot better the following week and received treatment as planned. However, she is starting to feel more drained again. She has some dyspnea on exertion but it improves with rest. She also has history of effusions. Her most recent thoracentesis was in October. She also has COPD and occasional  wheezing. She has a maintenance inhaler with Breztri.  She does not have a rescue inhaler.  She reports that she last saw Dr. Delton Coombes about a year and a half ago and that he told her he did want to see her back in the future for follow-up.  She does not have any upcoming appointments.   We did stool cards for worsening anemia which were negative. We also referred her to GI.  Her dose of alimta was reduced to 400 mg/m2 due to anemia.   Today, she denies any fever, chills, or night sweats.  According to the chart it looks like she lost approximately 9 pounds since last being seen.  However, it is unclear if that is accurate as the patient does not believe she is lost 10 pounds since last being seen.  Therefore her last weight may not be accurate. She denies any chest pain or hemoptysis.  He may cough "some".  The mucus is clear.  She denies any headache or visual changes. Denies any headache or visual changes. She denies nausea, diarrhea, vomiting, or constipation.  She denies visible bleeding. She is on eliquis. She denies any rashes or skin changes.  She is here today for evaluation and repeat blood work before undergoing cycle #31.     MEDICAL HISTORY: Past Medical History:  Diagnosis Date   Anemia    Asthma    Bronchitis    Cancer (HCC)    basal cell melanoma, cervical cancer   COPD (  chronic obstructive pulmonary disease) (HCC)    "early stages of COPD"   Headache    Lung cancer (HCC) 08/13/2021    ALLERGIES:  has no known allergies.  MEDICATIONS:  Current Outpatient Medications  Medication Sig Dispense Refill   albuterol (VENTOLIN HFA) 108 (90 Base) MCG/ACT inhaler Inhale 2 puffs into the lungs every 6 (six) hours as needed for wheezing or shortness of breath. 8 g 0   apixaban (ELIQUIS) 2.5 MG TABS tablet Take 1 tablet (2.5 mg total) by mouth 2 (two) times daily. 60 tablet 0   atorvastatin (LIPITOR) 80 MG tablet TAKE 1 TABLET BY MOUTH EVERY DAY 30 tablet 3   BREZTRI AEROSPHERE  160-9-4.8 MCG/ACT AERO INHALE 2 PUFFS INTO THE LUNGS IN THE MORNING AND AT BEDTIME. 10.7 each 11   doxycycline (VIBRA-TABS) 100 MG tablet Take 1 tablet (100 mg total) by mouth 2 (two) times daily. 20 tablet 0   folic acid (FOLVITE) 1 MG tablet TAKE 1 TABLET BY MOUTH EVERY DAY 30 tablet 2   HYDROcodone-acetaminophen (NORCO) 5-325 MG tablet Take 1 tablet by mouth every 6 (six) hours as needed for moderate pain. 15 tablet 0   lidocaine-prilocaine (EMLA) cream Apply 1 Application topically as needed. 30 g 2   methylPREDNISolone (MEDROL DOSEPAK) 4 MG TBPK tablet Use as instructed 21 tablet 0   prochlorperazine (COMPAZINE) 10 MG tablet Take 1 tablet (10 mg total) by mouth every 6 (six) hours as needed. 30 tablet 2   No current facility-administered medications for this visit.   Facility-Administered Medications Ordered in Other Visits  Medication Dose Route Frequency Provider Last Rate Last Admin   cyanocobalamin (VITAMIN B12) 1000 MCG/ML injection            prochlorperazine (COMPAZINE) 10 MG tablet             SURGICAL HISTORY:  Past Surgical History:  Procedure Laterality Date   APPENDECTOMY     1996   BREAST BIOPSY Right    2010-2015   BRONCHIAL BIOPSY  08/13/2021   Procedure: BRONCHIAL BIOPSIES;  Surgeon: Leslye Peer, MD;  Location: [facility] ENDOSCOPY;  Service: Pulmonary;;   BRONCHIAL BRUSHINGS  08/13/2021   Procedure: BRONCHIAL BRUSHINGS;  Surgeon: Leslye Peer, MD;  Location: [facility] ENDOSCOPY;  Service: Pulmonary;;   BRONCHIAL NEEDLE ASPIRATION BIOPSY  08/13/2021   Procedure: BRONCHIAL NEEDLE ASPIRATION BIOPSIES;  Surgeon: Leslye Peer, MD;  Location: [facility] ENDOSCOPY;  Service: Pulmonary;;   CESAREAN SECTION     1984, 1987   FOOT SURGERY Right    2 pins and screw 1999   FOOT SURGERY Left 06/21/2021   screw and 2 pins   HEMOSTASIS CONTROL  08/13/2021   Procedure: HEMOSTASIS CONTROL;  Surgeon: Leslye Peer, MD;  Location: [facility] ENDOSCOPY;  Service: Pulmonary;;   IR IMAGING GUIDED PORT  INSERTION  11/26/2021   MELANOMA EXCISION Left    basal cell melanoma 2011   PARTIAL HYSTERECTOMY     1994   TUBAL LIGATION     1987   VIDEO BRONCHOSCOPY  08/13/2021   Procedure: VIDEO BRONCHOSCOPY WITHOUT FLUORO;  Surgeon: Leslye Peer, MD;  Location: [facility];  Service: Pulmonary;;   VIDEO BRONCHOSCOPY WITH ENDOBRONCHIAL ULTRASOUND N/A 08/13/2021   Procedure: VIDEO BRONCHOSCOPY WITH ENDOBRONCHIAL ULTRASOUND;  Surgeon: Leslye Peer, MD;  Location: [facility] ENDOSCOPY;  Service: Pulmonary;  Laterality: N/A;    REVIEW OF SYSTEMS:   Review of Systems  Constitutional: Positive for fatigue. Questionable weight loss. Negative for appetite change,  chills, fatigue, fever and unexpected weight change.  HENT: Negative for mouth sores, nosebleeds, sore throat and trouble swallowing.   Eyes: Negative for eye problems and icterus.  Respiratory: Positive for dyspnea on exertion. Positive for intermittent cough. Positive for intermittent wheezing. Negative for hemoptysis.  Cardiovascular: Negative for chest pain and leg swelling.  Gastrointestinal: Negative for abdominal pain, constipation, diarrhea, nausea and vomiting.  Genitourinary: Negative for bladder incontinence, difficulty urinating, dysuria, frequency and hematuria.   Musculoskeletal: Negative for back pain, gait problem, neck pain and neck stiffness.  Skin: Negative for itching and rash.  Neurological: Negative for dizziness, extremity weakness, gait problem, headaches, light-headedness and seizures.  Hematological: Negative for adenopathy. Does not bruise/bleed easily.  Psychiatric/Behavioral: Negative for confusion, depression and sleep disturbance. The patient is not nervous/anxious.     PHYSICAL EXAMINATION:  Blood pressure 124/73, pulse (!) 104, temperature (!) 97.3 F (36.3 C), temperature source Temporal, resp. rate 16, weight 112 lb 4.8 oz (50.9 kg), SpO2 97%.  ECOG PERFORMANCE STATUS: 1  Physical Exam  Constitutional: Oriented  to person, place, and time and thin appearing female, and in no distress.  HENT:  Head: Normocephalic and atraumatic.  Mouth/Throat: Oropharynx is clear and moist. No oropharyngeal exudate.  Eyes: Conjunctivae are normal. Right eye exhibits no discharge. Left eye exhibits no discharge. No scleral icterus.  Neck: Normal range of motion. Neck supple.  Cardiovascular: Normal rate, regular rhythm, normal heart sounds and intact distal pulses.   Pulmonary/Chest: Effort normal quiet breath sounds bilaterally. No respiratory distress. No wheezes. No rales.  Abdominal: Soft. Bowel sounds are normal. Exhibits no distension and no mass. There is no tenderness.  Musculoskeletal: Normal range of motion. Exhibits no edema.  Lymphadenopathy:    No cervical adenopathy.  Neurological: Alert and oriented to person, place, and time. Exhibits muscle wasting. Gait normal. Coordination normal.  Skin: Skin is warm and dry. No rash noted. Not diaphoretic. No erythema. No pallor.  Psychiatric: Mood, memory and judgment normal.  Vitals reviewed.  LABORATORY DATA: Lab Results  Component Value Date   WBC 5.0 06/12/2023   HGB 9.7 (L) 06/12/2023   HCT 29.8 (L) 06/12/2023   MCV 113.3 (H) 06/12/2023   PLT 266 06/12/2023      Chemistry      Component Value Date/Time   NA 137 06/12/2023 1349   K 4.2 06/12/2023 1349   CL 105 06/12/2023 1349   CO2 29 06/12/2023 1349   BUN 7 06/12/2023 1349   CREATININE 0.76 06/12/2023 1349      Component Value Date/Time   CALCIUM 8.5 (L) 06/12/2023 1349   ALKPHOS 77 06/12/2023 1349   AST 22 06/12/2023 1349   ALT 10 06/12/2023 1349   BILITOT 0.2 06/12/2023 1349       RADIOGRAPHIC STUDIES:  No results found.   ASSESSMENT/PLAN:  This is a very pleasant 58 years old Caucasian female with Stage IV (T2b, N2, M1 C) non-small cell lung cancer, adenocarcinoma presented with right hilar mass in addition to mediastinal and upper abdominal lymphadenopathy in addition to  bilateral adrenal metastases and metastatic disease in the musculature of the lower back.  This was diagnosed in April 2023.   The molecular studies by [employer] showed no actionable mutations and the patient has negative PD-L1 expression. She completed palliative radiotherapy to the right hilar region under the care of Dr. Mitzi Hansen.   She completed palliative radiation to the right hilar area under the care of Dr. Mitzi Hansen.   She is currently undergoing  palliative systemic chemotherapy with carboplatin for an AUC of 5, Alimta 500 mg per metered squared, Keytruda 200 mg IV every 3 weeks.  She is status post 30 cycles.  Her first dose was on 08/29/2021. Starting from cycle #5, she started maintenance alimta and [place]. Starting from cycle #29, Dr. Arbutus Ped reduced her alimta to 400 mg/m2 due to worsening anemia.     Labs reviewed. Her Hbg is 9.7. She does not need a blood transfusion at this time but is continuing to trend downwards. Her stool cards were negative for blood. It could be due to chemotherapy. I let her know some patient's need to discontinue alimta due to cytopenias. We will continue to monitor her labs and continue her on dose reduced alimta at this time. However, we may re-visit discontinuing this in the future if she continues to require frequent transfusions. I will arrange for weekly labs between now and her next infusion in 3 weeks. We would consider her for a blood transfusion if her Hbg is <8.    We are arranging for a restaging CT scan prior to her next infusion. I did offer her a chest x ray today to assess her pleural effusion due to her shortness of breath. She declined and thinks she can wait to assess this on upcoming CT scan. She understands she can call in the interval if she changes her mind. She also has COPD and does not have rescue inhaler. She feels like she needs one when she has intermittent wheezing. I have sent this until she can re-establish with pulmonary medicine.  Her anemia can also contribute to her DOE and fatigue. Her iron studies and stool cards were also checked last month due to her worsening anemia and were acceptable.   Recommend that she proceed cycle #31 today as scheduled.    We will see her back for follow-up visit in 3 weeks for evaluation repeat blood work before undergoing cycle #32.     The patient was advised to call immediately if she has any concerning symptoms in the interval. The patient voices understanding of current disease status and treatment options and is in agreement with the current care plan. All questions were answered. The patient knows to call the clinic with any problems, questions or concerns. We can certainly see the patient much sooner if necessary    Orders Placed This Encounter  Procedures   CT CHEST ABDOMEN PELVIS W CONTRAST    Standing Status:   Future    Expected Date:   06/27/2023    Expiration Date:   06/11/2024    If indicated for the ordered procedure, I authorize the administration of contrast media per Radiology protocol:   Yes    Does the patient have a contrast media/X-ray dye allergy?:   No    Preferred imaging location?:   [facility]    If indicated for the ordered procedure, I authorize the administration of oral contrast media per Radiology protocol:   Yes   CBC with Differential ([facility] Only)    Standing Status:   Standing    Number of Occurrences:   10    Expiration Date:   06/11/2024   Sample to Blood Bank    Standing Status:   Standing    Number of Occurrences:   10    Next Expected Occurrence:   06/13/2023    Expiration Date:   06/11/2024    The total time spent in the appointment was 20-29 minutes  April Moffat L Starlina Lapre, PA-C 06/12/23

## 2023-06-12 ENCOUNTER — Inpatient Hospital Stay: Payer: BC Managed Care – PPO

## 2023-06-12 ENCOUNTER — Inpatient Hospital Stay: Payer: BC Managed Care – PPO | Admitting: Physician Assistant

## 2023-06-12 VITALS — BP 124/73 | HR 104 | Temp 97.3°F | Resp 16 | Wt 112.3 lb

## 2023-06-12 VITALS — HR 100

## 2023-06-12 DIAGNOSIS — Z9049 Acquired absence of other specified parts of digestive tract: Secondary | ICD-10-CM | POA: Diagnosis not present

## 2023-06-12 DIAGNOSIS — T451X5A Adverse effect of antineoplastic and immunosuppressive drugs, initial encounter: Secondary | ICD-10-CM | POA: Diagnosis not present

## 2023-06-12 DIAGNOSIS — D6481 Anemia due to antineoplastic chemotherapy: Secondary | ICD-10-CM | POA: Diagnosis not present

## 2023-06-12 DIAGNOSIS — C3491 Malignant neoplasm of unspecified part of right bronchus or lung: Secondary | ICD-10-CM | POA: Diagnosis not present

## 2023-06-12 DIAGNOSIS — Z95828 Presence of other vascular implants and grafts: Secondary | ICD-10-CM

## 2023-06-12 DIAGNOSIS — C7972 Secondary malignant neoplasm of left adrenal gland: Secondary | ICD-10-CM | POA: Diagnosis not present

## 2023-06-12 DIAGNOSIS — J90 Pleural effusion, not elsewhere classified: Secondary | ICD-10-CM | POA: Diagnosis not present

## 2023-06-12 DIAGNOSIS — Z5112 Encounter for antineoplastic immunotherapy: Secondary | ICD-10-CM

## 2023-06-12 DIAGNOSIS — Z9851 Tubal ligation status: Secondary | ICD-10-CM | POA: Diagnosis not present

## 2023-06-12 DIAGNOSIS — Z5111 Encounter for antineoplastic chemotherapy: Secondary | ICD-10-CM | POA: Diagnosis not present

## 2023-06-12 DIAGNOSIS — Z923 Personal history of irradiation: Secondary | ICD-10-CM | POA: Diagnosis not present

## 2023-06-12 DIAGNOSIS — Z8541 Personal history of malignant neoplasm of cervix uteri: Secondary | ICD-10-CM | POA: Diagnosis not present

## 2023-06-12 DIAGNOSIS — Z85820 Personal history of malignant melanoma of skin: Secondary | ICD-10-CM | POA: Diagnosis not present

## 2023-06-12 DIAGNOSIS — C7971 Secondary malignant neoplasm of right adrenal gland: Secondary | ICD-10-CM | POA: Diagnosis not present

## 2023-06-12 DIAGNOSIS — Z79620 Long term (current) use of immunosuppressive biologic: Secondary | ICD-10-CM | POA: Diagnosis not present

## 2023-06-12 LAB — CMP (CANCER CENTER ONLY)
ALT: 10 U/L (ref 0–44)
AST: 22 U/L (ref 15–41)
Albumin: 2.8 g/dL — ABNORMAL LOW (ref 3.5–5.0)
Alkaline Phosphatase: 77 U/L (ref 38–126)
Anion gap: 3 — ABNORMAL LOW (ref 5–15)
BUN: 7 mg/dL (ref 6–20)
CO2: 29 mmol/L (ref 22–32)
Calcium: 8.5 mg/dL — ABNORMAL LOW (ref 8.9–10.3)
Chloride: 105 mmol/L (ref 98–111)
Creatinine: 0.76 mg/dL (ref 0.44–1.00)
GFR, Estimated: >60 mL/min (ref >60)
Glucose, Bld: 85 mg/dL (ref 70–99)
Potassium: 4.2 mmol/L (ref 3.5–5.1)
Sodium: 137 mmol/L (ref 135–145)
Total Bilirubin: 0.2 mg/dL (ref 0.0–1.2)
Total Protein: 6.1 g/dL — ABNORMAL LOW (ref 6.5–8.1)

## 2023-06-12 LAB — CBC WITH DIFFERENTIAL (CANCER CENTER ONLY)
Abs Immature Granulocytes: 0.07 10*3/uL (ref 0.00–0.07)
Basophils Absolute: 0 10*3/uL (ref 0.0–0.1)
Basophils Relative: 1 %
Eosinophils Absolute: 0.1 10*3/uL (ref 0.0–0.5)
Eosinophils Relative: 3 %
HCT: 29.8 % — ABNORMAL LOW (ref 36.0–46.0)
Hemoglobin: 9.7 g/dL — ABNORMAL LOW (ref 12.0–15.0)
Immature Granulocytes: 1 %
Lymphocytes Relative: 29 %
Lymphs Abs: 1.5 10*3/uL (ref 0.7–4.0)
MCH: 36.9 pg — ABNORMAL HIGH (ref 26.0–34.0)
MCHC: 32.6 g/dL (ref 30.0–36.0)
MCV: 113.3 fL — ABNORMAL HIGH (ref 80.0–100.0)
Monocytes Absolute: 1.1 10*3/uL — ABNORMAL HIGH (ref 0.1–1.0)
Monocytes Relative: 22 %
Neutro Abs: 2.2 10*3/uL (ref 1.7–7.7)
Neutrophils Relative %: 44 %
Platelet Count: 266 10*3/uL (ref 150–400)
RBC: 2.63 MIL/uL — ABNORMAL LOW (ref 3.87–5.11)
RDW: 16.5 % — ABNORMAL HIGH (ref 11.5–15.5)
WBC Count: 5 10*3/uL (ref 4.0–10.5)
nRBC: 0 % (ref 0.0–0.2)

## 2023-06-12 MED ORDER — HEPARIN SOD (PORK) LOCK FLUSH 100 UNIT/ML IV SOLN
500.0000 [IU] | Freq: Once | INTRAVENOUS | Status: AC | PRN
  Administered 2023-06-12: 500 [IU]

## 2023-06-12 MED ORDER — SODIUM CHLORIDE 0.9 % IV SOLN
400.0000 mg/m2 | Freq: Once | INTRAVENOUS | Status: AC
  Administered 2023-06-12: 600 mg via INTRAVENOUS
  Filled 2023-06-12: qty 20

## 2023-06-12 MED ORDER — SODIUM CHLORIDE 0.9 % IV SOLN
200.0000 mg | Freq: Once | INTRAVENOUS | Status: AC
  Administered 2023-06-12: 200 mg via INTRAVENOUS
  Filled 2023-06-12: qty 200

## 2023-06-12 MED ORDER — PROCHLORPERAZINE MALEATE 10 MG PO TABS
10.0000 mg | ORAL_TABLET | Freq: Once | ORAL | Status: AC
  Administered 2023-06-12: 10 mg via ORAL
  Filled 2023-06-12: qty 1

## 2023-06-12 MED ORDER — SODIUM CHLORIDE 0.9 % IV SOLN: Freq: Once | INTRAVENOUS | Status: AC

## 2023-06-12 MED ORDER — ALBUTEROL SULFATE HFA 108 (90 BASE) MCG/ACT IN AERS: 2.0000 | INHALATION_SPRAY | Freq: Four times a day (QID) | RESPIRATORY_TRACT | 0 refills | Status: DC | PRN

## 2023-06-12 MED ORDER — SODIUM CHLORIDE 0.9% FLUSH
10.0000 mL | INTRAVENOUS | Status: DC | PRN
  Administered 2023-06-12: 10 mL

## 2023-06-12 MED ORDER — SODIUM CHLORIDE 0.9% FLUSH
10.0000 mL | Freq: Once | INTRAVENOUS | Status: AC
  Administered 2023-06-12: 10 mL

## 2023-06-17 ENCOUNTER — Encounter: Payer: Self-pay | Admitting: Physician Assistant

## 2023-06-19 ENCOUNTER — Inpatient Hospital Stay: Payer: BC Managed Care – PPO | Attending: Radiation Oncology

## 2023-06-19 ENCOUNTER — Telehealth: Payer: Self-pay

## 2023-06-19 DIAGNOSIS — C7971 Secondary malignant neoplasm of right adrenal gland: Secondary | ICD-10-CM | POA: Insufficient documentation

## 2023-06-19 DIAGNOSIS — C3491 Malignant neoplasm of unspecified part of right bronchus or lung: Secondary | ICD-10-CM | POA: Diagnosis not present

## 2023-06-19 DIAGNOSIS — Z9851 Tubal ligation status: Secondary | ICD-10-CM | POA: Insufficient documentation

## 2023-06-19 DIAGNOSIS — D6481 Anemia due to antineoplastic chemotherapy: Secondary | ICD-10-CM | POA: Diagnosis not present

## 2023-06-19 DIAGNOSIS — Z85820 Personal history of malignant melanoma of skin: Secondary | ICD-10-CM | POA: Diagnosis not present

## 2023-06-19 DIAGNOSIS — Z5111 Encounter for antineoplastic chemotherapy: Secondary | ICD-10-CM | POA: Diagnosis not present

## 2023-06-19 DIAGNOSIS — Z95828 Presence of other vascular implants and grafts: Secondary | ICD-10-CM

## 2023-06-19 DIAGNOSIS — T451X5D Adverse effect of antineoplastic and immunosuppressive drugs, subsequent encounter: Secondary | ICD-10-CM | POA: Diagnosis not present

## 2023-06-19 DIAGNOSIS — Z8541 Personal history of malignant neoplasm of cervix uteri: Secondary | ICD-10-CM | POA: Insufficient documentation

## 2023-06-19 DIAGNOSIS — Z9049 Acquired absence of other specified parts of digestive tract: Secondary | ICD-10-CM | POA: Diagnosis not present

## 2023-06-19 DIAGNOSIS — Z923 Personal history of irradiation: Secondary | ICD-10-CM | POA: Diagnosis not present

## 2023-06-19 DIAGNOSIS — C7972 Secondary malignant neoplasm of left adrenal gland: Secondary | ICD-10-CM | POA: Insufficient documentation

## 2023-06-19 DIAGNOSIS — Z5112 Encounter for antineoplastic immunotherapy: Secondary | ICD-10-CM | POA: Insufficient documentation

## 2023-06-19 LAB — CBC WITH DIFFERENTIAL (CANCER CENTER ONLY)
Abs Immature Granulocytes: 0.02 10*3/uL (ref 0.00–0.07)
Basophils Absolute: 0 10*3/uL (ref 0.0–0.1)
Basophils Relative: 1 %
Eosinophils Absolute: 0 10*3/uL (ref 0.0–0.5)
Eosinophils Relative: 1 %
HCT: 27.9 % — ABNORMAL LOW (ref 36.0–46.0)
Hemoglobin: 9.3 g/dL — ABNORMAL LOW (ref 12.0–15.0)
Immature Granulocytes: 1 %
Lymphocytes Relative: 26 %
Lymphs Abs: 1 10*3/uL (ref 0.7–4.0)
MCH: 36.5 pg — ABNORMAL HIGH (ref 26.0–34.0)
MCHC: 33.3 g/dL (ref 30.0–36.0)
MCV: 109.4 fL — ABNORMAL HIGH (ref 80.0–100.0)
Monocytes Absolute: 0.2 10*3/uL (ref 0.1–1.0)
Monocytes Relative: 5 %
Neutro Abs: 2.6 10*3/uL (ref 1.7–7.7)
Neutrophils Relative %: 66 %
Platelet Count: 192 10*3/uL (ref 150–400)
RBC: 2.55 MIL/uL — ABNORMAL LOW (ref 3.87–5.11)
RDW: 15 % (ref 11.5–15.5)
WBC Count: 3.8 10*3/uL — ABNORMAL LOW (ref 4.0–10.5)
nRBC: 0 % (ref 0.0–0.2)

## 2023-06-19 LAB — SAMPLE TO BLOOD BANK

## 2023-06-19 MED ORDER — HEPARIN SOD (PORK) LOCK FLUSH 100 UNIT/ML IV SOLN
500.0000 [IU] | Freq: Once | INTRAVENOUS | Status: AC
  Administered 2023-06-19: 500 [IU]

## 2023-06-19 MED ORDER — SODIUM CHLORIDE 0.9% FLUSH
10.0000 mL | Freq: Once | INTRAVENOUS | Status: AC
Start: 2023-06-19 — End: 2023-06-19
  Administered 2023-06-19: 10 mL

## 2023-06-19 NOTE — Telephone Encounter (Signed)
 Hgb today 9.3   Per Cassie Heilingoetter, PA  no blood needed   Pt advised

## 2023-06-25 ENCOUNTER — Ambulatory Visit (INDEPENDENT_AMBULATORY_CARE_PROVIDER_SITE_OTHER): Payer: BC Managed Care – PPO | Admitting: Emergency Medicine

## 2023-06-25 ENCOUNTER — Encounter: Payer: Self-pay | Admitting: Emergency Medicine

## 2023-06-25 VITALS — BP 114/62 | HR 101 | Ht 60.0 in | Wt 110.6 lb

## 2023-06-25 DIAGNOSIS — J449 Chronic obstructive pulmonary disease, unspecified: Secondary | ICD-10-CM | POA: Diagnosis not present

## 2023-06-25 DIAGNOSIS — C3491 Malignant neoplasm of unspecified part of right bronchus or lung: Secondary | ICD-10-CM | POA: Diagnosis not present

## 2023-06-25 DIAGNOSIS — R0602 Shortness of breath: Secondary | ICD-10-CM

## 2023-06-25 MED ORDER — ALBUTEROL SULFATE 0.63 MG/3ML IN NEBU: 1.0000 | INHALATION_SOLUTION | RESPIRATORY_TRACT | 11 refills | Status: AC | PRN

## 2023-06-25 NOTE — Assessment & Plan Note (Addendum)
She had flaring symptoms at the end of 2024, was treated for possible AE-COPD as well as for symptomatic anemia with blood transfusion.  It seems that the transfusion helped her more per her report.  She is still experiencing increased exertional dyspnea compared with her baseline a year ago.  Question whether she may be reaccumulating pleural fluid, could benefit from a repeat thoracentesis.  She was just given a prescription for albuterol and it does seem to be helping as well so her obstructive lung disease may be a contributor.  She has a CT scan of the chest/abdomen/pelvis tomorrow.  If the pleural fluid has reaccumulated then a thoracentesis might be indicated to see if she gets benefit.  Note that she is also at risk for pneumonitis due to her active therapy.  If progressive groundglass infiltrates then we will need to discuss findings with Dr. Arbutus Ped and see if it will impact her current treatment regimen.  Depending on those results and how she does over the next few months it may be beneficial to perform a walking oximetry to ensure that she does not have occult exertional hypoxemia.

## 2023-06-25 NOTE — Progress Notes (Signed)
Subjective:    Patient ID: April Walsh, female    DOB: Sep 22, 1965, 58 y.o.   MRN: [id number]  HPI  ROV 06/25/2023 --pleasant 58 year old woman with a history of tobacco use and associated COPD/asthma.  Also with a history of cervical cancer, migraines, depression, stage IV adenocarcinoma of the right lung diagnosed in 08/2021 by bronchoscopy, right MCA CVA. She has been followed by radiation oncology and oncology and is currently on carboplatin Alimta Keytruda.  Course complicated by pleural effusions that have required thoracenteses, last in October 2024.  Has been experiencing intermittent wheezing. She has had intermittent exertional SOB, was associated with dizziness and weakness in setting of symptomatic anemia at end of 2024. She is better after PRBC but still with exertional SOB w chores - notable decrease in her functional capacity. She remains on breztri. She just got albuterol HFA from Oncology - feels that it helps her. She has a repeat CT chest/abd/pelvis scheduled for tomorrow.   CT chest/abd/pelvis 04/17/23 > similar postradiation changes right perihilar region, no evidence of metastatic disease in the chest abdomen and pelvis, similar size moderate right and small left pleural effusions with associated atelectasis.  Some multifocal patchy groundglass opacities bilateral lower lobes   Review of Systems As per HPI  PMH: COPD/asthma Cervical cancer Basal cell skin cancer Migraines Depression  Surgical history: Appendectomy Cesarean x2 Partial hysterectomy tubal ligation Breast biopsy  Family History  Problem Relation Age of Onset   Brain cancer Maternal Aunt    Throat cancer Maternal Aunt    Lung cancer Paternal Uncle        he smoked   Lung cancer Paternal Uncle        he smoked   Ovarian cancer Maternal Grandmother    Lung cancer Cousin      Social History   Socioeconomic History   Marital status: Married    Spouse name: Not on file   Number of children:  Not on file   Years of education: Not on file   Highest education level: Not on file  Occupational History   Not on file  Tobacco Use   Smoking status: Former    Current packs/day: 0.50    Average packs/day: 0.5 packs/day for 32.0 years (16.0 ttl pk-yrs)    Types: Cigarettes    Passive exposure: Past   Smokeless tobacco: Never   Tobacco comments:    1/2 pack smoked daily ARJ, RN 08/02/21  Vaping Use   Vaping status: Never Used  Substance and Sexual Activity   Alcohol use: Never   Drug use: Never   Sexual activity: Not on file  Other Topics Concern   Not on file  Social History Narrative   Not on file   Social Drivers of Health   Financial Resource Strain: Not on file  Food Insecurity: Not on file  Transportation Needs: Not on file  Physical Activity: Not on file  Stress: Not on file  Social Connections: Not on file  Intimate Partner Violence: Not on file    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   No Known Allergies   Outpatient Medications Prior to Visit  Medication Sig Dispense Refill   albuterol (VENTOLIN HFA) 108 (90 Base) MCG/ACT inhaler Inhale 2 puffs into the lungs every 6 (six) hours as needed for wheezing or shortness of breath. 8 g 0   apixaban (ELIQUIS) 2.5 MG TABS tablet Take 1 tablet (2.5 mg total) by mouth 2 (two)  times daily. 60 tablet 0   atorvastatin (LIPITOR) 80 MG tablet TAKE 1 TABLET BY MOUTH EVERY DAY 30 tablet 3   BREZTRI AEROSPHERE 160-9-4.8 MCG/ACT AERO INHALE 2 PUFFS INTO THE LUNGS IN THE MORNING AND AT BEDTIME. 10.7 each 11   doxycycline (VIBRA-TABS) 100 MG tablet Take 1 tablet (100 mg total) by mouth 2 (two) times daily. 20 tablet 0   folic acid (FOLVITE) 1 MG tablet TAKE 1 TABLET BY MOUTH EVERY DAY 30 tablet 2   HYDROcodone-acetaminophen (NORCO) 5-325 MG tablet Take 1 tablet by mouth every 6 (six) hours as needed for moderate pain. 15 tablet 0   lidocaine-prilocaine (EMLA) cream Apply 1 Application  topically as needed. 30 g 2   methylPREDNISolone (MEDROL DOSEPAK) 4 MG TBPK tablet Use as instructed 21 tablet 0   prochlorperazine (COMPAZINE) 10 MG tablet Take 1 tablet (10 mg total) by mouth every 6 (six) hours as needed. 30 tablet 2   Facility-Administered Medications Prior to Visit  Medication Dose Route Frequency Provider Last Rate Last Admin   cyanocobalamin (VITAMIN B12) 1000 MCG/ML injection            prochlorperazine (COMPAZINE) 10 MG tablet                 Objective:   Physical Exam Vitals:   06/25/23 0844  BP: 114/62  Pulse: (!) 101  SpO2: 100%  Weight: 110 lb 9.6 oz (50.2 kg)  Height: 5' (1.524 m)    Gen: Pleasant, thin, in no distress,  normal affect  ENT: No lesions,  mouth clear,  oropharynx clear, no postnasal drip  Neck: No JVD, no stridor  Lungs: No use of accessory muscles, some bronchial breath sounds on the right, less prominent on the left.  No wheezes or crackles.  Cardiovascular: RRR,  syst M w intact S2, no peripheral edema  Musculoskeletal: No deformities, no cyanosis or clubbing  Neuro: alert, awake, non focal  Skin: Warm, no lesions or rash      Assessment & Plan:  Exertional shortness of breath She had flaring symptoms at the end of 2024, was treated for possible AE-COPD as well as for symptomatic anemia with blood transfusion.  It seems that the transfusion helped her more per her report.  She is still experiencing increased exertional dyspnea compared with her baseline a year ago.  Question whether she may be reaccumulating pleural fluid, could benefit from a repeat thoracentesis.  She was just given a prescription for albuterol and it does seem to be helping as well so her obstructive lung disease may be a contributor.  She has a CT scan of the chest/abdomen/pelvis tomorrow.  If the pleural fluid has reaccumulated then a thoracentesis might be indicated to see if she gets benefit.  Note that she is also at risk for pneumonitis due to her  active therapy.  If progressive groundglass infiltrates then we will need to discuss findings with Dr. Arbutus Ped and see if it will impact her current treatment regimen.  Depending on those results and how she does over the next few months it may be beneficial to perform a walking oximetry to ensure that she does not have occult exertional hypoxemia.  COPD (chronic obstructive pulmonary disease) (HCC) Continue Breztri.  Albuterol HFA was added recently.  I will give her prescription for albuterol nebs and a nebulizer machine to see if this delivery system is more effective, supplements.  Adenocarcinoma of right lung, stage 4 (HCC) On active therapy.  Following with  Dr. Arbutus Ped.  Her repeat imaging is scheduled for tomorrow.     Levy Pupa, MD, PhD 06/25/2023, 9:05 AM [facility] [phone] or if no answer before 7:00PM call [phone] For any issues after 7:00PM please call eLink [phone]

## 2023-06-25 NOTE — Assessment & Plan Note (Signed)
On active therapy.  Following with Dr. Arbutus Ped.  Her repeat imaging is scheduled for tomorrow.

## 2023-06-25 NOTE — Assessment & Plan Note (Signed)
Continue Breztri.  Albuterol HFA was added recently.  I will give her prescription for albuterol nebs and a nebulizer machine to see if this delivery system is more effective, supplements.

## 2023-06-25 NOTE — Patient Instructions (Signed)
We will follow your CT scan of the chest planned for 2/13.  If there has been a reaccumulation of pleural fluid then it may be beneficial to discuss a repeat thoracentesis to see if this helps your breathing. Continue Breztri 2 puffs twice a day.  Rinse and gargle after using. Keep your albuterol HFA available to use 2 puffs when needed for shortness of breath, chest tightness, wheezing. We will send a prescription for albuterol nebulizer and a nebulizer machine.  You can use the nebulized albuterol up to every 4 hours if needed for shortness of breath, chest tightness, wheezing. Follow-up with Dr. Arbutus Ped as planned Follow-up with Dr. Delton Coombes or APP in 3 months so we can assess your breathing at that time

## 2023-06-26 ENCOUNTER — Inpatient Hospital Stay: Payer: BC Managed Care – PPO

## 2023-06-26 ENCOUNTER — Telehealth: Payer: Self-pay | Admitting: Emergency Medicine

## 2023-06-26 ENCOUNTER — Ambulatory Visit (HOSPITAL_COMMUNITY)
Admission: RE | Admit: 2023-06-26 | Discharge: 2023-06-26 | Disposition: A | Discharge status: Home / Self Care | Payer: BC Managed Care – PPO | Source: Ambulatory Visit | Attending: Physician Assistant | Admitting: Physician Assistant

## 2023-06-26 DIAGNOSIS — I3139 Other pericardial effusion (noninflammatory): Secondary | ICD-10-CM | POA: Diagnosis not present

## 2023-06-26 DIAGNOSIS — K5730 Diverticulosis of large intestine without perforation or abscess without bleeding: Secondary | ICD-10-CM | POA: Diagnosis not present

## 2023-06-26 DIAGNOSIS — C3491 Malignant neoplasm of unspecified part of right bronchus or lung: Secondary | ICD-10-CM | POA: Diagnosis not present

## 2023-06-26 MED ORDER — HEPARIN SOD (PORK) LOCK FLUSH 100 UNIT/ML IV SOLN
INTRAVENOUS | Status: AC
  Filled 2023-06-26: qty 5

## 2023-06-26 MED ORDER — IOHEXOL 300 MG/ML  SOLN
100.0000 mL | Freq: Once | INTRAMUSCULAR | Status: AC | PRN
  Administered 2023-06-26: 90 mL via INTRAVENOUS

## 2023-06-26 MED ORDER — HEPARIN SOD (PORK) LOCK FLUSH 100 UNIT/ML IV SOLN
500.0000 [IU] | Freq: Once | INTRAVENOUS | Status: AC
  Administered 2023-06-26: 500 [IU] via INTRAVENOUS

## 2023-06-26 NOTE — Telephone Encounter (Signed)
PT states the [facility] RX that was sent could not be filled because they do not carry them. PT would like it called in to [facility] . She did get the solution ok. Her # is [phone]  AVS notes: We will send a prescription for albuterol nebulizer and a nebulizer machine. You can use the nebulized albuterol up to every 4 hours if needed for shortness of breath, chest tightness, wheezing.  [address] in [place] can be reached @ [phone]

## 2023-06-27 NOTE — Telephone Encounter (Signed)
[facility] please send nebulizer order to [facility].

## 2023-06-28 NOTE — Progress Notes (Deleted)
 [facility] OFFICE PROGRESS NOTE  [name], FNP No address on file  DIAGNOSIS: Stage IV (T2b, N2, M1 C) non-small cell lung cancer, adenocarcinoma presented with right hilar mass in addition to mediastinal and upper abdominal lymphadenopathy in addition to bilateral adrenal metastases and metastatic disease in the musculature of the lower back.  This was diagnosed in April 2023.   DETECTED ALTERATION(S) / BIOMARKER(S)     % CFDNA OR AMPLIFICATION        ASSOCIATED FDA-APPROVED THERAPIES         CLINICAL TRIAL AVAILABILITY [id number] 0.5% None     Yes [id number] (Splice Site Indel) 0.6% None     Yes   PD-L1 expression 0%  PRIOR THERAPY: Palliative radiotherapy to the right hilar region under the care of Dr. Mitzi Hansen   CURRENT THERAPY: Systemic chemotherapy with carboplatin for AUC of 5, Alimta 500 Mg/M2 and Keytruda 200 Mg IV every 3 weeks.  First dose August 29, 2021. Status post 31 cycles.  Starting from cycle #5, the patient started maintenance Alimta and Keytruda.  Her dose of Alimta was reduced to 400 mg/m starting from cycle #29 due to worsening anemia.   INTERVAL HISTORY: April Walsh 58 y.o. female returns to the clinic today for a follow-up visit. The patient was last seen by myself 3 weeks ago. The patient undergoes treatment with Alimta and Keytruda and she tolerates it well without any major concerning adverse side effects but with the more cumulative doses she has more fatigue and cytopenias. We did stool cards for worsening anemia which were negative. We also referred her to GI.  Her dose of alimta was reduced to 400 mg/m2 due to anemia.   At her last appointment, she was starting to feel fatigued again. She also has history of effusions. The most recent thoracentesis in ***.   She has a maintenance inhaler with Breztri. She followed up with Dr. Delton Coombes in the interval who recommended albuterol nebulizer. ***pneumonitis if progressive ground  glass infiltrates.   She denies any chest pain or hemoptysis.  He may cough "some".  The mucus is clear.  She denies any headache or visual changes. Denies any headache or visual changes. She denies nausea, diarrhea, vomiting, or constipation.  She denies visible bleeding. She is on eliquis. She denies any rashes or skin changes.  She is here today for evaluation and repeat blood work before undergoing cycle #32  MEDICAL HISTORY: Past Medical History:  Diagnosis Date   Anemia    Asthma    Bronchitis    Cancer (HCC)    basal cell melanoma, cervical cancer   COPD (chronic obstructive pulmonary disease) (HCC)    "early stages of COPD"   Headache    Lung cancer (HCC) 08/13/2021    ALLERGIES:  has no known allergies.  MEDICATIONS:  Current Outpatient Medications  Medication Sig Dispense Refill   albuterol (ACCUNEB) 0.63 MG/3ML nebulizer solution Take 3 mLs (0.63 mg total) by nebulization every 4 (four) hours as needed for wheezing or shortness of breath. 75 mL 11   albuterol (VENTOLIN HFA) 108 (90 Base) MCG/ACT inhaler Inhale 2 puffs into the lungs every 6 (six) hours as needed for wheezing or shortness of breath. 8 g 0   apixaban (ELIQUIS) 2.5 MG TABS tablet Take 1 tablet (2.5 mg total) by mouth 2 (two) times daily. 60 tablet 0   atorvastatin (LIPITOR) 80 MG tablet TAKE 1 TABLET BY MOUTH EVERY DAY 30 tablet 3   BREZTRI  AEROSPHERE 160-9-4.8 MCG/ACT AERO INHALE 2 PUFFS INTO THE LUNGS IN THE MORNING AND AT BEDTIME. 10.7 each 11   doxycycline (VIBRA-TABS) 100 MG tablet Take 1 tablet (100 mg total) by mouth 2 (two) times daily. 20 tablet 0   folic acid (FOLVITE) 1 MG tablet TAKE 1 TABLET BY MOUTH EVERY DAY 30 tablet 2   HYDROcodone-acetaminophen (NORCO) 5-325 MG tablet Take 1 tablet by mouth every 6 (six) hours as needed for moderate pain. 15 tablet 0   lidocaine-prilocaine (EMLA) cream Apply 1 Application topically as needed. 30 g 2   methylPREDNISolone (MEDROL DOSEPAK) 4 MG TBPK tablet Use  as instructed 21 tablet 0   prochlorperazine (COMPAZINE) 10 MG tablet Take 1 tablet (10 mg total) by mouth every 6 (six) hours as needed. 30 tablet 2   No current facility-administered medications for this visit.   Facility-Administered Medications Ordered in Other Visits  Medication Dose Route Frequency Provider Last Rate Last Admin   cyanocobalamin (VITAMIN B12) 1000 MCG/ML injection            prochlorperazine (COMPAZINE) 10 MG tablet             SURGICAL HISTORY:  Past Surgical History:  Procedure Laterality Date   APPENDECTOMY     1996   BREAST BIOPSY Right    2010-2015   BRONCHIAL BIOPSY  08/13/2021   Procedure: BRONCHIAL BIOPSIES;  Surgeon: Leslye Peer, MD;  Location: [facility] ENDOSCOPY;  Service: Pulmonary;;   BRONCHIAL BRUSHINGS  08/13/2021   Procedure: BRONCHIAL BRUSHINGS;  Surgeon: Leslye Peer, MD;  Location[facility] ENDOSCOPY;  Service: Pulmonary;;   BRONCHIAL NEEDLE ASPIRATION BIOPSY  08/13/2021   Procedure: BRONCHIAL NEEDLE ASPIRATION BIOPSIES;  Surgeon: Leslye Peer, MD;  Location: [facility] ENDOSCOPY;  Service: Pulmonary;;   CESAREAN SECTION     1984, 1987   FOOT SURGERY Right    2 pins and screw 1999   FOOT SURGERY Left 06/21/2021   screw and 2 pins   HEMOSTASIS CONTROL  08/13/2021   Procedure: HEMOSTASIS CONTROL;  Surgeon: Leslye Peer, MD;  Location: [facility] ENDOSCOPY;  Service: Pulmonary;;   IR IMAGING GUIDED PORT INSERTION  11/26/2021   MELANOMA EXCISION Left    basal cell melanoma 2011   PARTIAL HYSTERECTOMY     1994   TUBAL LIGATION     1987   VIDEO BRONCHOSCOPY  08/13/2021   Procedure: VIDEO BRONCHOSCOPY WITHOUT FLUORO;  Surgeon: Leslye Peer, MD;  Location: [facility] ENDOSCOPY;  Service: Pulmonary;;   VIDEO BRONCHOSCOPY WITH ENDOBRONCHIAL ULTRASOUND N/A 08/13/2021   Procedure: VIDEO BRONCHOSCOPY WITH ENDOBRONCHIAL ULTRASOUND;  Surgeon: Leslye Peer, MD;  Location: [facility] ENDOSCOPY;  Service: Pulmonary;  Laterality: N/A;    REVIEW OF SYSTEMS:   Review of Systems   Constitutional: Negative for appetite change, chills, fatigue, fever and unexpected weight change.  HENT:   Negative for mouth sores, nosebleeds, sore throat and trouble swallowing.   Eyes: Negative for eye problems and icterus.  Respiratory: Negative for cough, hemoptysis, shortness of breath and wheezing.   Cardiovascular: Negative for chest pain and leg swelling.  Gastrointestinal: Negative for abdominal pain, constipation, diarrhea, nausea and vomiting.  Genitourinary: Negative for bladder incontinence, difficulty urinating, dysuria, frequency and hematuria.   Musculoskeletal: Negative for back pain, gait problem, neck pain and neck stiffness.  Skin: Negative for itching and rash.  Neurological: Negative for dizziness, extremity weakness, gait problem, headaches, light-headedness and seizures.  Hematological: Negative for adenopathy. Does not bruise/bleed easily.  Psychiatric/Behavioral: Negative for confusion,  depression and sleep disturbance. The patient is not nervous/anxious.     PHYSICAL EXAMINATION:  There were no vitals taken for this visit.  ECOG PERFORMANCE STATUS: {CHL ONC ECOG [id number]  Physical Exam  Constitutional: Oriented to person, place, and time and well-developed, well-nourished, and in no distress. No distress.  HENT:  Head: Normocephalic and atraumatic.  Mouth/Throat: Oropharynx is clear and moist. No oropharyngeal exudate.  Eyes: Conjunctivae are normal. Right eye exhibits no discharge. Left eye exhibits no discharge. No scleral icterus.  Neck: Normal range of motion. Neck supple.  Cardiovascular: Normal rate, regular rhythm, normal heart sounds and intact distal pulses.   Pulmonary/Chest: Effort normal and breath sounds normal. No respiratory distress. No wheezes. No rales.  Abdominal: Soft. Bowel sounds are normal. Exhibits no distension and no mass. There is no tenderness.  Musculoskeletal: Normal range of motion. Exhibits no edema.  Lymphadenopathy:     No cervical adenopathy.  Neurological: Alert and oriented to person, place, and time. Exhibits normal muscle tone. Gait normal. Coordination normal.  Skin: Skin is warm and dry. No rash noted. Not diaphoretic. No erythema. No pallor.  Psychiatric: Mood, memory and judgment normal.  Vitals reviewed.  LABORATORY DATA: Lab Results  Component Value Date   WBC 3.8 (L) 06/19/2023   HGB 9.3 (L) 06/19/2023   HCT 27.9 (L) 06/19/2023   MCV 109.4 (H) 06/19/2023   PLT 192 06/19/2023      Chemistry      Component Value Date/Time   NA 137 06/12/2023 1349   K 4.2 06/12/2023 1349   CL 105 06/12/2023 1349   CO2 29 06/12/2023 1349   BUN 7 06/12/2023 1349   CREATININE 0.76 06/12/2023 1349      Component Value Date/Time   CALCIUM 8.5 (L) 06/12/2023 1349   ALKPHOS 77 06/12/2023 1349   AST 22 06/12/2023 1349   ALT 10 06/12/2023 1349   BILITOT 0.2 06/12/2023 1349       RADIOGRAPHIC STUDIES:  CT CHEST ABDOMEN PELVIS W CONTRAST Result Date: 06/26/2023 CLINICAL DATA:  History of stage IV lung cancer, follow-up. * Tracking Code: BO * EXAM: CT CHEST, ABDOMEN, AND PELVIS WITH CONTRAST TECHNIQUE: Multidetector CT imaging of the chest, abdomen and pelvis was performed following the standard protocol during bolus administration of intravenous contrast. RADIATION DOSE REDUCTION: This exam was performed according to the departmental dose-optimization program which includes automated exposure control, adjustment of the mA and/or kV according to patient size and/or use of iterative reconstruction technique. CONTRAST:  90mL OMNIPAQUE IOHEXOL 300 MG/ML  SOLN COMPARISON:  Multiple priors including most recent CT April 17, 2023 FINDINGS: CT CHEST FINDINGS Cardiovascular: Accessed right chest Port-A-Cath with tip in the right atrium. Aortic atherosclerosis. No central pulmonary embolus on this nondedicated study. Normal size heart. Similar small pericardial effusion. Mediastinum/Nodes: No suspicious thyroid  nodules. Pathologically enlarged mediastinal, hilar or axillary lymph nodes. The esophagus is grossly unremarkable. Similar soft tissue thickening about the right hilum with stranding in the middle mediastinum. Lungs/Pleura: Right perihilar volume loss, architectural distortion, interstitial thickening and airway thickening similar prior examination including a nodular component along the superior aspect measuring 2.2 x 1.5 cm on image 54/4 previously 2.2 x 1.4 cm. No new discrete suspicious nodularity in the treatment bed. Again seen is multifocal interlobular septal thickening with similar multifocal patchy ground-glass opacities Musculoskeletal: No aggressive lytic or blastic lesion of bone. Remote right-sided rib fractures. CT ABDOMEN PELVIS FINDINGS Hepatobiliary: No suspicious hepatic lesion. Gallbladder is unremarkable. No biliary ductal dilation. Pancreas:  No pancreatic ductal dilation or evidence of acute inflammation. Spleen: No splenomegaly. Adrenals/Urinary Tract: Bilateral adrenal glands appear normal. No hydronephrosis. Left lower pole renal cyst is considered benign and requiring no independent imaging follow-up. Urinary bladder is unremarkable for degree of distension. Stomach/Bowel: Stomach is minimally distended limiting evaluation. No pathologic dilation of small or large bowel. No evidence of acute bowel inflammation. Moderate volume of formed stool in the colon. Sigmoid colonic diverticulosis without findings of acute diverticulitis. Vascular/Lymphatic: Aortic atherosclerosis. No enlarged abdominal or pelvic lymph nodes. Reproductive: Status post hysterectomy. No adnexal masses. Other: Similar small volume ascites and subcutaneous edema. Musculoskeletal: No aggressive lytic or blastic lesion of bone. Multilevel degenerative change of the spine. Degenerative change of the bilateral hips. IMPRESSION: 1. Similar postradiation change in the right perihilar region without new suspicious finding to  suggest local recurrence. 2. No evidence of metastatic disease in the chest, abdomen or pelvis. 3. Similar multifocal interlobular septal thickening with multifocal patchy ground-glass opacities, nonspecific suggest continued attention on follow-up imaging. 4. Similar bilateral pleural effusions, small volume ascites and subcutaneous edema. 5.  Aortic Atherosclerosis (ICD10-I70.0). Electronically Signed   By: Maudry Mayhew M.D.   On: 06/26/2023 17:08     ASSESSMENT/PLAN:  This is a very pleasant 58 years old Caucasian female with Stage IV (T2b, N2, M1 C) non-small cell lung cancer, adenocarcinoma presented with right hilar mass in addition to mediastinal and upper abdominal lymphadenopathy in addition to bilateral adrenal metastases and metastatic disease in the musculature of the lower back.  This was diagnosed in April 2023.   The molecular studies by [employer] showed no actionable mutations and the patient has negative PD-L1 expression. She completed palliative radiotherapy to the right hilar region under the care of Dr. Mitzi Hansen.   She completed palliative radiation to the right hilar area under the care of Dr. Mitzi Hansen.  She is currently undergoing palliative systemic chemotherapy with carboplatin for an AUC of 5, Alimta 500 mg per metered squared, Keytruda 200 mg IV every 3 weeks.  She is status post 31 cycles.  Her first dose was on 08/29/2021. Starting from cycle #5, she started maintenance alimta and [place]. Starting from cycle #29, Dr. Arbutus Ped reduced her alimta to 400 mg/m2 due to worsening anemia.   Labs reviewed. Her Hbg is ***. I have standing orders in for sample to blood bank. . I let her know some patient's need to discontinue alimta due to cytopenias. We will continue to monitor her labs and continue her on dose reduced alimta at this time. However, we may re-visit discontinuing this in the future if she continues to require frequent transfusions. I will arrange for weekly labs between  now and her next infusion in 3 weeks. We would consider her for a blood transfusion if her Hbg is <8.   Her iron studies and stool cards were also checked last month due to her worsening anemia and were acceptable.   The patient was seen with Dr. Arbutus Ped today.  Dr. Arbutus Ped personally and independently reviewed the scan and discussed results with the patient today.  The scan showed ***.  Dr. Arbutus Ped recommends ***    Recommend that she proceed cycle #32 today as scheduled.    We will see her back for follow-up visit in 3 weeks for evaluation repeat blood work before undergoing cycle #33.    The patient was advised to call immediately if she has any concerning symptoms in the interval. The patient voices understanding of current disease status  and treatment options and is in agreement with the current care plan. All questions were answered. The patient knows to call the clinic with any problems, questions or concerns. We can certainly see the patient much sooner if necessary         No orders of the defined types were placed in this encounter.    I spent {CHL ONC TIME VISIT - WUJWJ:[id number]} counseling the patient face to face. The total time spent in the appointment was {CHL ONC TIME VISIT - OZHYQ:[id number]}.  Dinnis Rog L Aryaa Bunting, PA-C 06/28/23

## 2023-07-01 NOTE — Progress Notes (Unsigned)
 [facility] OFFICE PROGRESS NOTE  April Mangle, FNP No address on file  DIAGNOSIS: Stage IV (T2b, N2, M1 C) non-small cell lung cancer, adenocarcinoma presented with right hilar mass in addition to mediastinal and upper abdominal lymphadenopathy in addition to bilateral adrenal metastases and metastatic disease in the musculature of the lower back.  This was diagnosed in April 2023.   DETECTED ALTERATION(S) / BIOMARKER(S)     % CFDNA OR AMPLIFICATION        ASSOCIATED FDA-APPROVED THERAPIES         CLINICAL TRIAL AVAILABILITY [id number] 0.5% None     Yes [id number] (Splice Site Indel) 0.6% None     Yes   PD-L1 expression 0%  PRIOR THERAPY: Palliative radiotherapy to the right hilar region under the care of Dr. Mitzi Hansen   CURRENT THERAPY: Systemic chemotherapy with carboplatin for AUC of 5, Alimta 500 Mg/M2 and Keytruda 200 Mg IV every 3 weeks.  First dose August 29, 2021. Status post 31 cycles.  Starting from cycle #5, the patient started maintenance Alimta and Keytruda.  Her dose of Alimta was reduced to 400 mg/m starting from cycle #29 due to worsening anemia.   INTERVAL HISTORY: April Walsh 58 y.o. female returns to the clinic today for a follow-up visit. The patient was last seen by myself 3 weeks ago. The patient undergoes treatment with Alimta and Keytruda and she tolerates it well without any major concerning adverse side effects but with the more cumulative doses she has more fatigue and cytopenias. We did stool cards for worsening anemia which were negative. We also referred her to GI.  Her dose of alimta was reduced to 400 mg/m2 due to anemia.   At her last appointment, she was starting to feel fatigued again. She also has history of effusions. The most recent thoracentesis in ***.   She has a maintenance inhaler with Breztri. She followed up with Dr. Delton Coombes in the interval who recommended albuterol nebulizer. ***pneumonitis if progressive ground  glass infiltrates.   She denies any chest pain or hemoptysis.  He may cough "some".  The mucus is clear.  She denies any headache or visual changes. Denies any headache or visual changes. She denies nausea, diarrhea, vomiting, or constipation.  She denies visible bleeding. She is on eliquis. She denies any rashes or skin changes.  She is here today for evaluation and repeat blood work before undergoing cycle #32  MEDICAL HISTORY: Past Medical History:  Diagnosis Date   Anemia    Asthma    Bronchitis    Cancer (HCC)    basal cell melanoma, cervical cancer   COPD (chronic obstructive pulmonary disease) (HCC)    "early stages of COPD"   Headache    Lung cancer (HCC) 08/13/2021    ALLERGIES:  has no known allergies.  MEDICATIONS:  Current Outpatient Medications  Medication Sig Dispense Refill   albuterol (ACCUNEB) 0.63 MG/3ML nebulizer solution Take 3 mLs (0.63 mg total) by nebulization every 4 (four) hours as needed for wheezing or shortness of breath. 75 mL 11   albuterol (VENTOLIN HFA) 108 (90 Base) MCG/ACT inhaler Inhale 2 puffs into the lungs every 6 (six) hours as needed for wheezing or shortness of breath. 8 g 0   apixaban (ELIQUIS) 2.5 MG TABS tablet Take 1 tablet (2.5 mg total) by mouth 2 (two) times daily. 60 tablet 0   atorvastatin (LIPITOR) 80 MG tablet TAKE 1 TABLET BY MOUTH EVERY DAY 30 tablet 3   BREZTRI  AEROSPHERE 160-9-4.8 MCG/ACT AERO INHALE 2 PUFFS INTO THE LUNGS IN THE MORNING AND AT BEDTIME. 10.7 each 11   doxycycline (VIBRA-TABS) 100 MG tablet Take 1 tablet (100 mg total) by mouth 2 (two) times daily. 20 tablet 0   folic acid (FOLVITE) 1 MG tablet TAKE 1 TABLET BY MOUTH EVERY DAY 30 tablet 2   HYDROcodone-acetaminophen (NORCO) 5-325 MG tablet Take 1 tablet by mouth every 6 (six) hours as needed for moderate pain. 15 tablet 0   lidocaine-prilocaine (EMLA) cream Apply 1 Application topically as needed. 30 g 2   methylPREDNISolone (MEDROL DOSEPAK) 4 MG TBPK tablet Use  as instructed 21 tablet 0   prochlorperazine (COMPAZINE) 10 MG tablet Take 1 tablet (10 mg total) by mouth every 6 (six) hours as needed. 30 tablet 2   No current facility-administered medications for this visit.   Facility-Administered Medications Ordered in Other Visits  Medication Dose Route Frequency Provider Last Rate Last Admin   cyanocobalamin (VITAMIN B12) 1000 MCG/ML injection            prochlorperazine (COMPAZINE) 10 MG tablet             SURGICAL HISTORY:  Past Surgical History:  Procedure Laterality Date   APPENDECTOMY     1996   BREAST BIOPSY Right    2010-2015   BRONCHIAL BIOPSY  08/13/2021   Procedure: BRONCHIAL BIOPSIES;  Surgeon: Leslye Peer, MD;  Location: [facility] ENDOSCOPY;  Service: Pulmonary;;   BRONCHIAL BRUSHINGS  08/13/2021   Procedure: BRONCHIAL BRUSHINGS;  Surgeon: Leslye Peer, MD;  Location: [facility] ENDOSCOPY;  Service: Pulmonary;;   BRONCHIAL NEEDLE ASPIRATION BIOPSY  08/13/2021   Procedure: BRONCHIAL NEEDLE ASPIRATION BIOPSIES;  Surgeon: Leslye Peer, MD;  Location: [facility] ENDOSCOPY;  Service: Pulmonary;;   CESAREAN SECTION     1984, 1987   FOOT SURGERY Right    2 pins and screw 1999   FOOT SURGERY Left 06/21/2021   screw and 2 pins   HEMOSTASIS CONTROL  08/13/2021   Procedure: HEMOSTASIS CONTROL;  Surgeon: Leslye Peer, MD;  Location: [facility] ENDOSCOPY;  Service: Pulmonary;;   IR IMAGING GUIDED PORT INSERTION  11/26/2021   MELANOMA EXCISION Left    basal cell melanoma 2011   PARTIAL HYSTERECTOMY     1994   TUBAL LIGATION     1987   VIDEO BRONCHOSCOPY  08/13/2021   Procedure: VIDEO BRONCHOSCOPY WITHOUT FLUORO;  Surgeon: Leslye Peer, MD;  Location: [facility] ENDOSCOPY;  Service: Pulmonary;;   VIDEO BRONCHOSCOPY WITH ENDOBRONCHIAL ULTRASOUND N/A 08/13/2021   Procedure: VIDEO BRONCHOSCOPY WITH ENDOBRONCHIAL ULTRASOUND;  Surgeon: Leslye Peer, MD;  Location: [facility] ENDOSCOPY;  Service: Pulmonary;  Laterality: N/A;    REVIEW OF SYSTEMS:   Review of Systems   Constitutional: Negative for appetite change, chills, fatigue, fever and unexpected weight change.  HENT:   Negative for mouth sores, nosebleeds, sore throat and trouble swallowing.   Eyes: Negative for eye problems and icterus.  Respiratory: Negative for cough, hemoptysis, shortness of breath and wheezing.   Cardiovascular: Negative for chest pain and leg swelling.  Gastrointestinal: Negative for abdominal pain, constipation, diarrhea, nausea and vomiting.  Genitourinary: Negative for bladder incontinence, difficulty urinating, dysuria, frequency and hematuria.   Musculoskeletal: Negative for back pain, gait problem, neck pain and neck stiffness.  Skin: Negative for itching and rash.  Neurological: Negative for dizziness, extremity weakness, gait problem, headaches, light-headedness and seizures.  Hematological: Negative for adenopathy. Does not bruise/bleed easily.  Psychiatric/Behavioral: Negative for confusion,  depression and sleep disturbance. The patient is not nervous/anxious.     PHYSICAL EXAMINATION:  There were no vitals taken for this visit.  ECOG PERFORMANCE STATUS: {CHL ONC ECOG [id number]  Physical Exam  Constitutional: Oriented to person, place, and time and well-developed, well-nourished, and in no distress. No distress.  HENT:  Head: Normocephalic and atraumatic.  Mouth/Throat: Oropharynx is clear and moist. No oropharyngeal exudate.  Eyes: Conjunctivae are normal. Right eye exhibits no discharge. Left eye exhibits no discharge. No scleral icterus.  Neck: Normal range of motion. Neck supple.  Cardiovascular: Normal rate, regular rhythm, normal heart sounds and intact distal pulses.   Pulmonary/Chest: Effort normal and breath sounds normal. No respiratory distress. No wheezes. No rales.  Abdominal: Soft. Bowel sounds are normal. Exhibits no distension and no mass. There is no tenderness.  Musculoskeletal: Normal range of motion. Exhibits no edema.  Lymphadenopathy:     No cervical adenopathy.  Neurological: Alert and oriented to person, place, and time. Exhibits normal muscle tone. Gait normal. Coordination normal.  Skin: Skin is warm and dry. No rash noted. Not diaphoretic. No erythema. No pallor.  Psychiatric: Mood, memory and judgment normal.  Vitals reviewed.  LABORATORY DATA: Lab Results  Component Value Date   WBC 3.8 (L) 06/19/2023   HGB 9.3 (L) 06/19/2023   HCT 27.9 (L) 06/19/2023   MCV 109.4 (H) 06/19/2023   PLT 192 06/19/2023      Chemistry      Component Value Date/Time   NA 137 06/12/2023 1349   K 4.2 06/12/2023 1349   CL 105 06/12/2023 1349   CO2 29 06/12/2023 1349   BUN 7 06/12/2023 1349   CREATININE 0.76 06/12/2023 1349      Component Value Date/Time   CALCIUM 8.5 (L) 06/12/2023 1349   ALKPHOS 77 06/12/2023 1349   AST 22 06/12/2023 1349   ALT 10 06/12/2023 1349   BILITOT 0.2 06/12/2023 1349       RADIOGRAPHIC STUDIES:  CT CHEST ABDOMEN PELVIS W CONTRAST Result Date: 06/26/2023 CLINICAL DATA:  History of stage IV lung cancer, follow-up. * Tracking Code: BO * EXAM: CT CHEST, ABDOMEN, AND PELVIS WITH CONTRAST TECHNIQUE: Multidetector CT imaging of the chest, abdomen and pelvis was performed following the standard protocol during bolus administration of intravenous contrast. RADIATION DOSE REDUCTION: This exam was performed according to the departmental dose-optimization program which includes automated exposure control, adjustment of the mA and/or kV according to patient size and/or use of iterative reconstruction technique. CONTRAST:  90mL OMNIPAQUE IOHEXOL 300 MG/ML  SOLN COMPARISON:  Multiple priors including most recent CT April 17, 2023 FINDINGS: CT CHEST FINDINGS Cardiovascular: Accessed right chest Port-A-Cath with tip in the right atrium. Aortic atherosclerosis. No central pulmonary embolus on this nondedicated study. Normal size heart. Similar small pericardial effusion. Mediastinum/Nodes: No suspicious thyroid  nodules. Pathologically enlarged mediastinal, hilar or axillary lymph nodes. The esophagus is grossly unremarkable. Similar soft tissue thickening about the right hilum with stranding in the middle mediastinum. Lungs/Pleura: Right perihilar volume loss, architectural distortion, interstitial thickening and airway thickening similar prior examination including a nodular component along the superior aspect measuring 2.2 x 1.5 cm on image 54/4 previously 2.2 x 1.4 cm. No new discrete suspicious nodularity in the treatment bed. Again seen is multifocal interlobular septal thickening with similar multifocal patchy ground-glass opacities Musculoskeletal: No aggressive lytic or blastic lesion of bone. Remote right-sided rib fractures. CT ABDOMEN PELVIS FINDINGS Hepatobiliary: No suspicious hepatic lesion. Gallbladder is unremarkable. No biliary ductal dilation. Pancreas:  No pancreatic ductal dilation or evidence of acute inflammation. Spleen: No splenomegaly. Adrenals/Urinary Tract: Bilateral adrenal glands appear normal. No hydronephrosis. Left lower pole renal cyst is considered benign and requiring no independent imaging follow-up. Urinary bladder is unremarkable for degree of distension. Stomach/Bowel: Stomach is minimally distended limiting evaluation. No pathologic dilation of small or large bowel. No evidence of acute bowel inflammation. Moderate volume of formed stool in the colon. Sigmoid colonic diverticulosis without findings of acute diverticulitis. Vascular/Lymphatic: Aortic atherosclerosis. No enlarged abdominal or pelvic lymph nodes. Reproductive: Status post hysterectomy. No adnexal masses. Other: Similar small volume ascites and subcutaneous edema. Musculoskeletal: No aggressive lytic or blastic lesion of bone. Multilevel degenerative change of the spine. Degenerative change of the bilateral hips. IMPRESSION: 1. Similar postradiation change in the right perihilar region without new suspicious finding to  suggest local recurrence. 2. No evidence of metastatic disease in the chest, abdomen or pelvis. 3. Similar multifocal interlobular septal thickening with multifocal patchy ground-glass opacities, nonspecific suggest continued attention on follow-up imaging. 4. Similar bilateral pleural effusions, small volume ascites and subcutaneous edema. 5.  Aortic Atherosclerosis (ICD10-I70.0). Electronically Signed   By: Maudry Mayhew M.D.   On: 06/26/2023 17:08     ASSESSMENT/PLAN:  This is a very pleasant 58 years old Caucasian female with Stage IV (T2b, N2, M1 C) non-small cell lung cancer, adenocarcinoma presented with right hilar mass in addition to mediastinal and upper abdominal lymphadenopathy in addition to bilateral adrenal metastases and metastatic disease in the musculature of the lower back.  This was diagnosed in April 2023.   The molecular studies by [employer] showed no actionable mutations and the patient has negative PD-L1 expression. She completed palliative radiotherapy to the right hilar region under the care of Dr. Mitzi Hansen.   She completed palliative radiation to the right hilar area under the care of Dr. Mitzi Hansen.  She is currently undergoing palliative systemic chemotherapy with carboplatin for an AUC of 5, Alimta 500 mg per metered squared, Keytruda 200 mg IV every 3 weeks.  She is status post 31 cycles.  Her first dose was on 08/29/2021. Starting from cycle #5, she started maintenance alimta and [place]. Starting from cycle #29, Dr. Arbutus Ped reduced her alimta to 400 mg/m2 due to worsening anemia.   Labs reviewed. Her Hbg is ***. I have standing orders in for sample to blood bank. . I let her know some patient's need to discontinue alimta due to cytopenias. We will continue to monitor her labs and continue her on dose reduced alimta at this time. However, we may re-visit discontinuing this in the future if she continues to require frequent transfusions. I will arrange for weekly labs between  now and her next infusion in 3 weeks. We would consider her for a blood transfusion if her Hbg is <8.   Her iron studies and stool cards were also checked last month due to her worsening anemia and were acceptable.   The patient was seen with Dr. Arbutus Ped today.  Dr. Arbutus Ped personally and independently reviewed the scan and discussed results with the patient today.  The scan showed ***.  Dr. Arbutus Ped recommends ***    Recommend that she proceed cycle #32 today as scheduled.    We will see her back for follow-up visit in 3 weeks for evaluation repeat blood work before undergoing cycle #33.    The patient was advised to call immediately if she has any concerning symptoms in the interval. The patient voices understanding of current disease status  and treatment options and is in agreement with the current care plan. All questions were answered. The patient knows to call the clinic with any problems, questions or concerns. We can certainly see the patient much sooner if necessary         No orders of the defined types were placed in this encounter.    I spent {CHL ONC TIME VISIT - WUJWJ:[id number]} counseling the patient face to face. The total time spent in the appointment was {CHL ONC TIME VISIT - OZHYQ:[id number]}.  Kyleah Pensabene L Nicloe Frontera, PA-C 07/01/23

## 2023-07-03 ENCOUNTER — Inpatient Hospital Stay: Payer: BC Managed Care – PPO

## 2023-07-03 ENCOUNTER — Ambulatory Visit: Payer: BC Managed Care – PPO | Admitting: Physician Assistant

## 2023-07-03 ENCOUNTER — Other Ambulatory Visit: Payer: BC Managed Care – PPO

## 2023-07-03 ENCOUNTER — Inpatient Hospital Stay: Payer: BC Managed Care – PPO | Admitting: Physician Assistant

## 2023-07-03 VITALS — HR 96

## 2023-07-03 VITALS — BP 129/72 | HR 111 | Temp 98.1°F | Resp 18 | Wt 111.0 lb

## 2023-07-03 DIAGNOSIS — T451X5D Adverse effect of antineoplastic and immunosuppressive drugs, subsequent encounter: Secondary | ICD-10-CM | POA: Diagnosis not present

## 2023-07-03 DIAGNOSIS — Z9851 Tubal ligation status: Secondary | ICD-10-CM | POA: Diagnosis not present

## 2023-07-03 DIAGNOSIS — C3491 Malignant neoplasm of unspecified part of right bronchus or lung: Secondary | ICD-10-CM

## 2023-07-03 DIAGNOSIS — Z923 Personal history of irradiation: Secondary | ICD-10-CM | POA: Diagnosis not present

## 2023-07-03 DIAGNOSIS — Z9049 Acquired absence of other specified parts of digestive tract: Secondary | ICD-10-CM | POA: Diagnosis not present

## 2023-07-03 DIAGNOSIS — C7972 Secondary malignant neoplasm of left adrenal gland: Secondary | ICD-10-CM | POA: Diagnosis not present

## 2023-07-03 DIAGNOSIS — Z5111 Encounter for antineoplastic chemotherapy: Secondary | ICD-10-CM

## 2023-07-03 DIAGNOSIS — Z5112 Encounter for antineoplastic immunotherapy: Secondary | ICD-10-CM | POA: Diagnosis not present

## 2023-07-03 DIAGNOSIS — D6481 Anemia due to antineoplastic chemotherapy: Secondary | ICD-10-CM | POA: Diagnosis not present

## 2023-07-03 DIAGNOSIS — Z8541 Personal history of malignant neoplasm of cervix uteri: Secondary | ICD-10-CM | POA: Diagnosis not present

## 2023-07-03 DIAGNOSIS — C7971 Secondary malignant neoplasm of right adrenal gland: Secondary | ICD-10-CM | POA: Diagnosis not present

## 2023-07-03 DIAGNOSIS — Z85820 Personal history of malignant melanoma of skin: Secondary | ICD-10-CM | POA: Diagnosis not present

## 2023-07-03 DIAGNOSIS — Z95828 Presence of other vascular implants and grafts: Secondary | ICD-10-CM

## 2023-07-03 LAB — CBC WITH DIFFERENTIAL (CANCER CENTER ONLY)
Abs Immature Granulocytes: 0.11 10*3/uL — ABNORMAL HIGH (ref 0.00–0.07)
Basophils Absolute: 0.1 10*3/uL (ref 0.0–0.1)
Basophils Relative: 1 %
Eosinophils Absolute: 0.2 10*3/uL (ref 0.0–0.5)
Eosinophils Relative: 3 %
HCT: 29.1 % — ABNORMAL LOW (ref 36.0–46.0)
Hemoglobin: 9.5 g/dL — ABNORMAL LOW (ref 12.0–15.0)
Immature Granulocytes: 2 %
Lymphocytes Relative: 23 %
Lymphs Abs: 1.5 10*3/uL (ref 0.7–4.0)
MCH: 37.4 pg — ABNORMAL HIGH (ref 26.0–34.0)
MCHC: 32.6 g/dL (ref 30.0–36.0)
MCV: 114.6 fL — ABNORMAL HIGH (ref 80.0–100.0)
Monocytes Absolute: 1.2 10*3/uL — ABNORMAL HIGH (ref 0.1–1.0)
Monocytes Relative: 18 %
Neutro Abs: 3.5 10*3/uL (ref 1.7–7.7)
Neutrophils Relative %: 53 %
Platelet Count: 333 10*3/uL (ref 150–400)
RBC: 2.54 MIL/uL — ABNORMAL LOW (ref 3.87–5.11)
RDW: 17.7 % — ABNORMAL HIGH (ref 11.5–15.5)
WBC Count: 6.6 10*3/uL (ref 4.0–10.5)
nRBC: 0 % (ref 0.0–0.2)

## 2023-07-03 LAB — CMP (CANCER CENTER ONLY)
ALT: 18 U/L (ref 0–44)
AST: 32 U/L (ref 15–41)
Albumin: 2.9 g/dL — ABNORMAL LOW (ref 3.5–5.0)
Alkaline Phosphatase: 77 U/L (ref 38–126)
Anion gap: 5 (ref 5–15)
BUN: 12 mg/dL (ref 6–20)
CO2: 27 mmol/L (ref 22–32)
Calcium: 8.7 mg/dL — ABNORMAL LOW (ref 8.9–10.3)
Chloride: 106 mmol/L (ref 98–111)
Creatinine: 0.79 mg/dL (ref 0.44–1.00)
GFR, Estimated: >60 mL/min (ref >60)
Glucose, Bld: 119 mg/dL — ABNORMAL HIGH (ref 70–99)
Potassium: 3.5 mmol/L (ref 3.5–5.1)
Sodium: 138 mmol/L (ref 135–145)
Total Bilirubin: 0.2 mg/dL (ref 0.0–1.2)
Total Protein: 6.1 g/dL — ABNORMAL LOW (ref 6.5–8.1)

## 2023-07-03 LAB — SAMPLE TO BLOOD BANK

## 2023-07-03 MED ORDER — HEPARIN SOD (PORK) LOCK FLUSH 100 UNIT/ML IV SOLN
500.0000 [IU] | Freq: Once | INTRAVENOUS | Status: AC | PRN
  Administered 2023-07-03: 500 [IU]

## 2023-07-03 MED ORDER — SODIUM CHLORIDE 0.9 % IV SOLN: Freq: Once | INTRAVENOUS | Status: AC

## 2023-07-03 MED ORDER — SODIUM CHLORIDE 0.9% FLUSH
10.0000 mL | Freq: Once | INTRAVENOUS | Status: AC
  Administered 2023-07-03: 10 mL

## 2023-07-03 MED ORDER — PROCHLORPERAZINE MALEATE 10 MG PO TABS
10.0000 mg | ORAL_TABLET | Freq: Once | ORAL | Status: AC
  Administered 2023-07-03: 10 mg via ORAL
  Filled 2023-07-03: qty 1

## 2023-07-03 MED ORDER — PEMBROLIZUMAB CHEMO INJECTION 100 MG/4ML
200.0000 mg | Freq: Once | INTRAVENOUS | Status: AC
  Administered 2023-07-03: 200 mg via INTRAVENOUS
  Filled 2023-07-03: qty 200

## 2023-07-03 MED ORDER — SODIUM CHLORIDE 0.9 % IV SOLN
400.0000 mg/m2 | Freq: Once | INTRAVENOUS | Status: AC
  Administered 2023-07-03: 600 mg via INTRAVENOUS
  Filled 2023-07-03: qty 20

## 2023-07-03 MED ORDER — SODIUM CHLORIDE 0.9% FLUSH
10.0000 mL | INTRAVENOUS | Status: DC | PRN
  Administered 2023-07-03: 10 mL

## 2023-07-03 NOTE — Patient Instructions (Signed)
 [facility] - A DEPT OF [facility]  Discharge Instructions: Thank you for choosing [facility] to provide your oncology and hematology care.   If you have a lab appointment with the Cancer Center, please go directly to the Cancer Center and check in at the registration area.   Wear comfortable clothing and clothing appropriate for easy access to any Portacath or PICC line.   We strive to give you quality time with your provider. You may need to reschedule your appointment if you arrive late (15 or more minutes).  Arriving late affects you and other patients whose appointments are after yours.  Also, if you miss three or more appointments without notifying the office, you may be dismissed from the clinic at the provider's discretion.      For prescription refill requests, have your pharmacy contact our office and allow 72 hours for refills to be completed.    Today you received the following chemotherapy and/or immunotherapy agents: Keytruda and Alimta      To help prevent nausea and vomiting after your treatment, we encourage you to take your nausea medication as directed.  BELOW ARE SYMPTOMS THAT SHOULD BE REPORTED IMMEDIATELY: *FEVER GREATER THAN 100.4 F (38 C) OR HIGHER *CHILLS OR SWEATING *NAUSEA AND VOMITING THAT IS NOT CONTROLLED WITH YOUR NAUSEA MEDICATION *UNUSUAL SHORTNESS OF BREATH *UNUSUAL BRUISING OR BLEEDING *URINARY PROBLEMS (pain or burning when urinating, or frequent urination) *BOWEL PROBLEMS (unusual diarrhea, constipation, pain near the anus) TENDERNESS IN MOUTH AND THROAT WITH OR WITHOUT PRESENCE OF ULCERS (sore throat, sores in mouth, or a toothache) UNUSUAL RASH, SWELLING OR PAIN  UNUSUAL VAGINAL DISCHARGE OR ITCHING   Items with * indicate a potential emergency and should be followed up as soon as possible or go to the Emergency Department if any problems should occur.  Please show the CHEMOTHERAPY ALERT CARD or  IMMUNOTHERAPY ALERT CARD at check-in to the Emergency Department and triage nurse.  Should you have questions after your visit or need to cancel or reschedule your appointment, please contact [facility] - A DEPT OF [facility]  Dept: [phone]  and follow the prompts.  Office hours are 8:00 a.m. to 4:30 p.m. Monday - Friday. Please note that voicemails left after 4:00 p.m. may not be returned until the following business day.  We are closed weekends and major holidays. You have access to a nurse at all times for urgent questions. Please call the main number to the clinic Dept: [phone] and follow the prompts.   For any non-urgent questions, you may also contact your provider using MyChart. We now offer e-Visits for anyone 35 and older to request care online for non-urgent symptoms. For details visit [email or web address].   Also download the MyChart app! Go to the app store, search "MyChart", open the app, select [facility], and log in with your MyChart username and password.

## 2023-07-05 NOTE — Progress Notes (Signed)
 Updated pemetrexed ERX to [id number]   Pryor Ochoa, PharmD 07/05/23

## 2023-07-10 ENCOUNTER — Other Ambulatory Visit: Payer: Self-pay | Admitting: Physician Assistant

## 2023-07-10 DIAGNOSIS — C3401 Malignant neoplasm of right main bronchus: Secondary | ICD-10-CM

## 2023-07-10 DIAGNOSIS — C3491 Malignant neoplasm of unspecified part of right bronchus or lung: Secondary | ICD-10-CM

## 2023-07-11 ENCOUNTER — Telehealth: Payer: Self-pay

## 2023-07-11 NOTE — Telephone Encounter (Signed)
 Notified Patient of prior authorization approval for Lidocaine-Prilocaine 2.5% Cream (EMLA). Medication is approved through 10/09/2023. Pharmacy notified. No other needs or concerns noted at this time.

## 2023-07-17 ENCOUNTER — Telehealth: Payer: Self-pay | Admitting: Emergency Medicine

## 2023-07-17 DIAGNOSIS — C3491 Malignant neoplasm of unspecified part of right bronchus or lung: Secondary | ICD-10-CM

## 2023-07-17 MED ORDER — BREZTRI AEROSPHERE 160-9-4.8 MCG/ACT IN AERO: 2.0000 | INHALATION_SPRAY | Freq: Two times a day (BID) | RESPIRATORY_TRACT | 10 refills | Status: AC

## 2023-07-17 NOTE — Telephone Encounter (Signed)
 I called and spoke to pt. Pt states Dr Sofie Hartigan was only going to rx the Albuterol inahler 1 time until she saw Dr Delton Coombes. Pt states Dr Delton Coombes is suppose to take over the Albuterol inhaler rx. I informed pt that I could go ahead and send refills on Breztri, but due to the Albuterol being in another provider's name, I would send this to Dr Delton Coombes to verify if he was going to start rx the Albuterol inhaler. Pt verbalized understanding. Breztri refills sent. Routing to Dr Delton Coombes.   Pt was last seen by Dr Delton Coombes on 06-25-2023

## 2023-07-17 NOTE — Telephone Encounter (Signed)
 Patient needs refill of Breztri and albuterol inhaler   Pharmacy: [facility]

## 2023-07-18 MED ORDER — ALBUTEROL SULFATE HFA 108 (90 BASE) MCG/ACT IN AERS: 2.0000 | INHALATION_SPRAY | Freq: Four times a day (QID) | RESPIRATORY_TRACT | 8 refills | Status: DC | PRN

## 2023-07-18 NOTE — Telephone Encounter (Signed)
 Thank you - Ok to take over refills of her albuterol

## 2023-07-18 NOTE — Progress Notes (Signed)
 [facility] OFFICE PROGRESS NOTE  April Mangle, FNP No address on file  DIAGNOSIS: Stage IV (T2b, N2, M1 C) non-small cell lung cancer, adenocarcinoma presented with right hilar mass in addition to mediastinal and upper abdominal lymphadenopathy in addition to bilateral adrenal metastases and metastatic disease in the musculature of the lower back.  This was diagnosed in April 2023.   DETECTED ALTERATION(S) / BIOMARKER(S)     % CFDNA OR AMPLIFICATION        ASSOCIATED FDA-APPROVED THERAPIES         CLINICAL TRIAL AVAILABILITY [id number] 0.5% None     Yes [id number] (Splice Site Indel) 0.6% None     Yes   PD-L1 expression 0%  PRIOR THERAPY: Palliative radiotherapy to the right hilar region under the care of Dr. Mitzi Walsh   CURRENT THERAPY: Systemic chemotherapy with carboplatin for AUC of 5, Alimta 500 Mg/M2 and Keytruda 200 Mg IV every 3 weeks.  First dose August 29, 2021. Status post 32 cycles.  Starting from cycle #5, the patient started maintenance Alimta and Keytruda.  Her dose of Alimta was reduced to 400 mg/m starting from cycle #29 due to worsening anemia.   INTERVAL HISTORY: April Walsh 58 y.o. female returns to the clinic today for a follow-up visit accompanied by her aunt.  The patient was last seen by myself 3 weeks ago. The patient undergoes treatment with Alimta and Keytruda and she tolerates it well without any major concerning adverse side effects but with the more cumulative doses she has more fatigue and cytopenias.  Her dose of alimta was reduced to 400 mg/m2 due to anemia.  We did stool cards for worsening anemia which were negative. We also referred her to GI since she did not have her routine colonoscopies.  She is scheduled to see them on 07/28/23  At her last appointment, she was starting to feel fatigued again. She also has history of effusions. She continues to have fatigue. She also lost a lot of weight since last being seen. She had  seen nutrition in the past. She does do protein supplemental drinks sometimes but not on a regular basis.    She has a maintenance inhaler with Breztri. She followed up with Dr. Delton Walsh in the interval who recommended albuterol nebulizer and inhalers. She denies any chest pain or hemoptysis. Her shortness of breath is stable since last being seen. She denies significant cough but she is anticipating she will have cough with allergy season.    She denies any headache or visual changes.  She denies nausea, diarrhea, vomiting, or constipation.  She denies visible bleeding. She is on eliquis. She denies any rashes or skin changes. She is here today for evaluation and repeat blood work before undergoing cycle #33       MEDICAL HISTORY: Past Medical History:  Diagnosis Date   Anemia    Asthma    Bronchitis    Cancer (HCC)    basal cell melanoma, cervical cancer   COPD (chronic obstructive pulmonary disease) (HCC)    "early stages of COPD"   Headache    Lung cancer (HCC) 08/13/2021    ALLERGIES:  has no known allergies.  MEDICATIONS:  Current Outpatient Medications  Medication Sig Dispense Refill   mirtazapine (REMERON) 7.5 MG tablet Take 1 tablet (7.5 mg total) by mouth at bedtime. 30 tablet 2   albuterol (ACCUNEB) 0.63 MG/3ML nebulizer solution Take 3 mLs (0.63 mg total) by nebulization every 4 (four) hours as needed  for wheezing or shortness of breath. 75 mL 11   albuterol (VENTOLIN HFA) 108 (90 Base) MCG/ACT inhaler Inhale 2 puffs into the lungs every 6 (six) hours as needed for wheezing or shortness of breath. 8 g 8   apixaban (ELIQUIS) 2.5 MG TABS tablet Take 1 tablet (2.5 mg total) by mouth 2 (two) times daily. 60 tablet 0   atorvastatin (LIPITOR) 80 MG tablet TAKE 1 TABLET BY MOUTH EVERY DAY 30 tablet 3   Budeson-Glycopyrrol-Formoterol (BREZTRI AEROSPHERE) 160-9-4.8 MCG/ACT AERO Inhale 2 puffs into the lungs in the morning and at bedtime. 10.7 each 10   doxycycline (VIBRA-TABS) 100  MG tablet Take 1 tablet (100 mg total) by mouth 2 (two) times daily. (Patient not taking: Reported on 07/24/2023) 20 tablet 0   folic acid (FOLVITE) 1 MG tablet TAKE 1 TABLET BY MOUTH EVERY DAY 30 tablet 2   HYDROcodone-acetaminophen (NORCO) 5-325 MG tablet Take 1 tablet by mouth every 6 (six) hours as needed for moderate pain. (Patient not taking: Reported on 07/24/2023) 15 tablet 0   lidocaine-prilocaine (EMLA) cream APPLY TOPICALLY 1 APPLICATION AS NEEDED 30 g 2   methylPREDNISolone (MEDROL DOSEPAK) 4 MG TBPK tablet Use as instructed (Patient not taking: Reported on 07/24/2023) 21 tablet 0   prochlorperazine (COMPAZINE) 10 MG tablet Take 1 tablet (10 mg total) by mouth every 6 (six) hours as needed. 30 tablet 2   No current facility-administered medications for this visit.   Facility-Administered Medications Ordered in Other Visits  Medication Dose Route Frequency Provider Last Rate Last Admin   cyanocobalamin (VITAMIN B12) 1000 MCG/ML injection            prochlorperazine (COMPAZINE) 10 MG tablet             SURGICAL HISTORY:  Past Surgical History:  Procedure Laterality Date   APPENDECTOMY     1996   BREAST BIOPSY Right    2010-2015   BRONCHIAL BIOPSY  08/13/2021   Procedure: BRONCHIAL BIOPSIES;  Surgeon: Leslye Peer, MD;  Location: [facility] ENDOSCOPY;  Service: Pulmonary;;   BRONCHIAL BRUSHINGS  08/13/2021   Procedure: BRONCHIAL BRUSHINGS;  Surgeon: Leslye Peer, MD;  Location: [facility] ENDOSCOPY;  Service: Pulmonary;;   BRONCHIAL NEEDLE ASPIRATION BIOPSY  08/13/2021   Procedure: BRONCHIAL NEEDLE ASPIRATION BIOPSIES;  Surgeon: Leslye Peer, MD;  Location: [facility] ENDOSCOPY;  Service: Pulmonary;;   CESAREAN SECTION     1984, 1987   FOOT SURGERY Right    2 pins and screw 1999   FOOT SURGERY Left 06/21/2021   screw and 2 pins   HEMOSTASIS CONTROL  08/13/2021   Procedure: HEMOSTASIS CONTROL;  Surgeon: Leslye Peer, MD;  Location: [facility] ENDOSCOPY;  Service: Pulmonary;;   IR IMAGING GUIDED PORT  INSERTION  11/26/2021   MELANOMA EXCISION Left    basal cell melanoma 2011   PARTIAL HYSTERECTOMY     1994   TUBAL LIGATION     1987   VIDEO BRONCHOSCOPY  08/13/2021   Procedure: VIDEO BRONCHOSCOPY WITHOUT FLUORO;  Surgeon: Leslye Peer, MD;  Location: [facility] ENDOSCOPY;  Service: Pulmonary;;   VIDEO BRONCHOSCOPY WITH ENDOBRONCHIAL ULTRASOUND N/A 08/13/2021   Procedure: VIDEO BRONCHOSCOPY WITH ENDOBRONCHIAL ULTRASOUND;  Surgeon: Leslye Peer, MD;  Location: [facility] ENDOSCOPY;  Service: Pulmonary;  Laterality: N/A;    REVIEW OF SYSTEMS:   Constitutional: Positive for stable fatigue. Positive for decreased appetite and weight loss. Negative for appetite change, chills, and fever.  HENT: Negative for mouth sores, nosebleeds, sore throat and  trouble swallowing.   Eyes: Negative for eye problems and icterus.  Respiratory: Positive for stable dyspnea on exertion. Positive for intermittent wheezing. Negative for hemoptysis. Denies significant cough.  Cardiovascular: Negative for chest pain and leg swelling.  Gastrointestinal: Negative for abdominal pain, constipation, diarrhea, nausea and vomiting.  Genitourinary: Negative for bladder incontinence, difficulty urinating, dysuria, frequency and hematuria.   Musculoskeletal: Negative for back pain, gait problem, neck pain and neck stiffness.  Skin: Negative for itching and rash.  Neurological: Negative for dizziness, extremity weakness, gait problem, headaches, light-headedness and seizures.  Hematological: Negative for adenopathy. Does not bruise/bleed easily.  Psychiatric/Behavioral: Negative for confusion, depression and sleep disturbance. The patient is not nervous/anxious.     PHYSICAL EXAMINATION:  Blood pressure 105/67, pulse (!) 103, temperature 97.6 F (36.4 C), temperature source Temporal, resp. rate 16, height 5' (1.524 m), weight 104 lb 1.6 oz (47.2 kg), SpO2 98%.  ECOG PERFORMANCE STATUS: 1  Physical Exam  Constitutional: Oriented to  person, place, and time and thin appearing female, and in no distress.  HENT:  Head: Normocephalic and atraumatic.  Mouth/Throat: Oropharynx is clear and moist. No oropharyngeal exudate.  Eyes: Conjunctivae are normal. Right eye exhibits no discharge. Left eye exhibits no discharge. No scleral icterus.  Neck: Normal range of motion. Neck supple.  Cardiovascular: Normal rate, regular rhythm, normal heart sounds and intact distal pulses.   Pulmonary/Chest: Effort normal. No respiratory distress. No wheezes. No rales.  Abdominal: Soft. Bowel sounds are normal. Exhibits no distension and no mass. There is no tenderness.  Musculoskeletal: Normal range of motion. Exhibits no edema.  Lymphadenopathy:    No cervical adenopathy.  Neurological: Alert and oriented to person, place, and time. Exhibits muscle wasting. Gait normal. Coordination normal.  Skin: Skin is warm and dry. No rash noted. Not diaphoretic. No erythema. No pallor.  Psychiatric: Mood, memory and judgment normal.  Vitals reviewed.  LABORATORY DATA: Lab Results  Component Value Date   WBC 7.7 07/24/2023   HGB 9.4 (L) 07/24/2023   HCT 28.7 (L) 07/24/2023   MCV 115.3 (H) 07/24/2023   PLT 344 07/24/2023      Chemistry      Component Value Date/Time   NA 137 07/24/2023 0927   K 3.8 07/24/2023 0927   CL 106 07/24/2023 0927   CO2 26 07/24/2023 0927   BUN 13 07/24/2023 0927   CREATININE 0.94 07/24/2023 0927      Component Value Date/Time   CALCIUM 8.6 (L) 07/24/2023 0927   ALKPHOS 78 07/24/2023 0927   AST 27 07/24/2023 0927   ALT 14 07/24/2023 0927   BILITOT 0.2 07/24/2023 0927       RADIOGRAPHIC STUDIES:  CT CHEST ABDOMEN PELVIS W CONTRAST Result Date: 06/26/2023 CLINICAL DATA:  History of stage IV lung cancer, follow-up. * Tracking Code: BO * EXAM: CT CHEST, ABDOMEN, AND PELVIS WITH CONTRAST TECHNIQUE: Multidetector CT imaging of the chest, abdomen and pelvis was performed following the standard protocol during bolus  administration of intravenous contrast. RADIATION DOSE REDUCTION: This exam was performed according to the departmental dose-optimization program which includes automated exposure control, adjustment of the mA and/or kV according to patient size and/or use of iterative reconstruction technique. CONTRAST:  90mL OMNIPAQUE IOHEXOL 300 MG/ML  SOLN COMPARISON:  Multiple priors including most recent CT April 17, 2023 FINDINGS: CT CHEST FINDINGS Cardiovascular: Accessed right chest Port-A-Cath with tip in the right atrium. Aortic atherosclerosis. No central pulmonary embolus on this nondedicated study. Normal size heart. Similar small pericardial  effusion. Mediastinum/Nodes: No suspicious thyroid nodules. Pathologically enlarged mediastinal, hilar or axillary lymph nodes. The esophagus is grossly unremarkable. Similar soft tissue thickening about the right hilum with stranding in the middle mediastinum. Lungs/Pleura: Right perihilar volume loss, architectural distortion, interstitial thickening and airway thickening similar prior examination including a nodular component along the superior aspect measuring 2.2 x 1.5 cm on image 54/4 previously 2.2 x 1.4 cm. No new discrete suspicious nodularity in the treatment bed. Again seen is multifocal interlobular septal thickening with similar multifocal patchy ground-glass opacities Musculoskeletal: No aggressive lytic or blastic lesion of bone. Remote right-sided rib fractures. CT ABDOMEN PELVIS FINDINGS Hepatobiliary: No suspicious hepatic lesion. Gallbladder is unremarkable. No biliary ductal dilation. Pancreas: No pancreatic ductal dilation or evidence of acute inflammation. Spleen: No splenomegaly. Adrenals/Urinary Tract: Bilateral adrenal glands appear normal. No hydronephrosis. Left lower pole renal cyst is considered benign and requiring no independent imaging follow-up. Urinary bladder is unremarkable for degree of distension. Stomach/Bowel: Stomach is minimally  distended limiting evaluation. No pathologic dilation of small or large bowel. No evidence of acute bowel inflammation. Moderate volume of formed stool in the colon. Sigmoid colonic diverticulosis without findings of acute diverticulitis. Vascular/Lymphatic: Aortic atherosclerosis. No enlarged abdominal or pelvic lymph nodes. Reproductive: Status post hysterectomy. No adnexal masses. Other: Similar small volume ascites and subcutaneous edema. Musculoskeletal: No aggressive lytic or blastic lesion of bone. Multilevel degenerative change of the spine. Degenerative change of the bilateral hips. IMPRESSION: 1. Similar postradiation change in the right perihilar region without new suspicious finding to suggest local recurrence. 2. No evidence of metastatic disease in the chest, abdomen or pelvis. 3. Similar multifocal interlobular septal thickening with multifocal patchy ground-glass opacities, nonspecific suggest continued attention on follow-up imaging. 4. Similar bilateral pleural effusions, small volume ascites and subcutaneous edema. 5.  Aortic Atherosclerosis (ICD10-I70.0). Electronically Signed   By: Maudry Mayhew M.D.   On: 06/26/2023 17:08     ASSESSMENT/PLAN:  This is a very pleasant 58 years old Caucasian female with Stage IV (T2b, N2, M1 C) non-small cell lung cancer, adenocarcinoma presented with right hilar mass in addition to mediastinal and upper abdominal lymphadenopathy in addition to bilateral adrenal metastases and metastatic disease in the musculature of the lower back.  This was diagnosed in April 2023.   The molecular studies by [employer] showed no actionable mutations and the patient has negative PD-L1 expression. She completed palliative radiotherapy to the right hilar region under the care of Dr. Mitzi Walsh.   She completed palliative radiation to the right hilar area under the care of Dr. Mitzi Walsh.   She is currently undergoing palliative systemic chemotherapy with carboplatin for an AUC  of 5, Alimta 500 mg per metered squared, Keytruda 200 mg IV every 3 weeks.  She is status post 31 cycles.  Her first dose was on 08/29/2021. Starting from cycle #5, she started maintenance alimta and [place]. Starting from cycle #29, Dr. Arbutus Ped reduced her alimta to 400 mg/m2 due to worsening anemia.   Labs reviewed. Her Hbg is stable at 9.4. I have standing orders in for sample to blood bank.     We would consider her for a blood transfusion if her Hbg is <8.    Her iron studies and stool cards were also checked in December 2024 due to her worsening anemia and were acceptable. She is scheduled to see GI this month though. I told her she can wait until after cycle #35 to schedule any procedures and I would recommend to do procedures  if needed ~2 weeks after prior chemo or one week before the next cycle.   Recommend that she proceed cycle #33 today as scheduled.    We will see her back for follow-up visit in 3 weeks for evaluation repeat blood work before undergoing cycle #34.   Did did talk about alimta. She is on a reduced dose. Should she have progressive fatigue, we may have to discuss options on what to do.   I encouraged to increase her protein supplement drinks. I have sent remeron to the pharmacy 7.5 mg, which we can increase to 15 mg moving forward. She is not a good candidate for steroids due to the immunotherapy. She is not a good candidate for megace due to side effect with history of stroke. She is not a candidate for Marinol due to side effect profile.   The patient was advised to call immediately if she has any concerning symptoms in the interval. The patient voices understanding of current disease status and treatment options and is in agreement with the current care plan. All questions were answered. The patient knows to call the clinic with any problems, questions or concerns. We can certainly see the patient much sooner if necessary        No orders of the defined types  were placed in this encounter.     The total time spent in the appointment was 20-29 minutes  Coltin Casher L Borna Wessinger, PA-C 07/24/23

## 2023-07-18 NOTE — Telephone Encounter (Signed)
 I called and spoke to pt. Pt informed that Dr Delton Coombes will take over the Albuterol. Sending refills for pt. Pt verbalized understanding. NFN

## 2023-07-18 NOTE — Addendum Note (Signed)
 Addended by: Gay Filler T on: 07/18/2023 09:02 AM   Modules accepted: Orders

## 2023-07-24 ENCOUNTER — Inpatient Hospital Stay: Payer: BC Managed Care – PPO | Admitting: Physician Assistant

## 2023-07-24 ENCOUNTER — Inpatient Hospital Stay: Payer: BC Managed Care – PPO

## 2023-07-24 ENCOUNTER — Encounter: Payer: Self-pay | Admitting: Internal Medicine

## 2023-07-24 ENCOUNTER — Inpatient Hospital Stay: Payer: BC Managed Care – PPO | Attending: Radiation Oncology

## 2023-07-24 VITALS — HR 99

## 2023-07-24 VITALS — BP 105/67 | HR 103 | Temp 97.6°F | Resp 16 | Ht 60.0 in | Wt 104.1 lb

## 2023-07-24 DIAGNOSIS — Z9851 Tubal ligation status: Secondary | ICD-10-CM | POA: Insufficient documentation

## 2023-07-24 DIAGNOSIS — D6481 Anemia due to antineoplastic chemotherapy: Secondary | ICD-10-CM | POA: Diagnosis not present

## 2023-07-24 DIAGNOSIS — T451X5D Adverse effect of antineoplastic and immunosuppressive drugs, subsequent encounter: Secondary | ICD-10-CM | POA: Diagnosis not present

## 2023-07-24 DIAGNOSIS — Z923 Personal history of irradiation: Secondary | ICD-10-CM | POA: Insufficient documentation

## 2023-07-24 DIAGNOSIS — Z9049 Acquired absence of other specified parts of digestive tract: Secondary | ICD-10-CM | POA: Insufficient documentation

## 2023-07-24 DIAGNOSIS — Z79620 Long term (current) use of immunosuppressive biologic: Secondary | ICD-10-CM | POA: Diagnosis not present

## 2023-07-24 DIAGNOSIS — Z5112 Encounter for antineoplastic immunotherapy: Secondary | ICD-10-CM

## 2023-07-24 DIAGNOSIS — C7972 Secondary malignant neoplasm of left adrenal gland: Secondary | ICD-10-CM | POA: Diagnosis not present

## 2023-07-24 DIAGNOSIS — Z8541 Personal history of malignant neoplasm of cervix uteri: Secondary | ICD-10-CM | POA: Insufficient documentation

## 2023-07-24 DIAGNOSIS — Z85820 Personal history of malignant melanoma of skin: Secondary | ICD-10-CM | POA: Insufficient documentation

## 2023-07-24 DIAGNOSIS — Z7901 Long term (current) use of anticoagulants: Secondary | ICD-10-CM | POA: Insufficient documentation

## 2023-07-24 DIAGNOSIS — Z5111 Encounter for antineoplastic chemotherapy: Secondary | ICD-10-CM | POA: Insufficient documentation

## 2023-07-24 DIAGNOSIS — C7971 Secondary malignant neoplasm of right adrenal gland: Secondary | ICD-10-CM | POA: Diagnosis not present

## 2023-07-24 DIAGNOSIS — Z79631 Long term (current) use of antimetabolite agent: Secondary | ICD-10-CM | POA: Insufficient documentation

## 2023-07-24 DIAGNOSIS — C3491 Malignant neoplasm of unspecified part of right bronchus or lung: Secondary | ICD-10-CM

## 2023-07-24 DIAGNOSIS — Z95828 Presence of other vascular implants and grafts: Secondary | ICD-10-CM

## 2023-07-24 LAB — CMP (CANCER CENTER ONLY)
ALT: 14 U/L (ref 0–44)
AST: 27 U/L (ref 15–41)
Albumin: 3 g/dL — ABNORMAL LOW (ref 3.5–5.0)
Alkaline Phosphatase: 78 U/L (ref 38–126)
Anion gap: 5 (ref 5–15)
BUN: 13 mg/dL (ref 6–20)
CO2: 26 mmol/L (ref 22–32)
Calcium: 8.6 mg/dL — ABNORMAL LOW (ref 8.9–10.3)
Chloride: 106 mmol/L (ref 98–111)
Creatinine: 0.94 mg/dL (ref 0.44–1.00)
GFR, Estimated: >60 mL/min (ref >60)
Glucose, Bld: 78 mg/dL (ref 70–99)
Potassium: 3.8 mmol/L (ref 3.5–5.1)
Sodium: 137 mmol/L (ref 135–145)
Total Bilirubin: 0.2 mg/dL (ref 0.0–1.2)
Total Protein: 6.7 g/dL (ref 6.5–8.1)

## 2023-07-24 LAB — CBC WITH DIFFERENTIAL (CANCER CENTER ONLY)
Abs Immature Granulocytes: 0.16 10*3/uL — ABNORMAL HIGH (ref 0.00–0.07)
Basophils Absolute: 0 10*3/uL (ref 0.0–0.1)
Basophils Relative: 1 %
Eosinophils Absolute: 0.1 10*3/uL (ref 0.0–0.5)
Eosinophils Relative: 2 %
HCT: 28.7 % — ABNORMAL LOW (ref 36.0–46.0)
Hemoglobin: 9.4 g/dL — ABNORMAL LOW (ref 12.0–15.0)
Immature Granulocytes: 2 %
Lymphocytes Relative: 14 %
Lymphs Abs: 1.1 10*3/uL (ref 0.7–4.0)
MCH: 37.8 pg — ABNORMAL HIGH (ref 26.0–34.0)
MCHC: 32.8 g/dL (ref 30.0–36.0)
MCV: 115.3 fL — ABNORMAL HIGH (ref 80.0–100.0)
Monocytes Absolute: 1 10*3/uL (ref 0.1–1.0)
Monocytes Relative: 13 %
Neutro Abs: 5.3 10*3/uL (ref 1.7–7.7)
Neutrophils Relative %: 68 %
Platelet Count: 344 10*3/uL (ref 150–400)
RBC: 2.49 MIL/uL — ABNORMAL LOW (ref 3.87–5.11)
RDW: 16.8 % — ABNORMAL HIGH (ref 11.5–15.5)
WBC Count: 7.7 10*3/uL (ref 4.0–10.5)
nRBC: 0 % (ref 0.0–0.2)

## 2023-07-24 LAB — TSH: TSH: 1.402 u[IU]/mL (ref 0.350–4.500)

## 2023-07-24 LAB — SAMPLE TO BLOOD BANK

## 2023-07-24 MED ORDER — HEPARIN SOD (PORK) LOCK FLUSH 100 UNIT/ML IV SOLN
500.0000 [IU] | Freq: Once | INTRAVENOUS | Status: AC | PRN
  Administered 2023-07-24: 500 [IU]

## 2023-07-24 MED ORDER — MIRTAZAPINE 7.5 MG PO TABS: 7.5000 mg | ORAL_TABLET | Freq: Every day | ORAL | 2 refills | Status: DC

## 2023-07-24 MED ORDER — SODIUM CHLORIDE 0.9% FLUSH
10.0000 mL | Freq: Once | INTRAVENOUS | Status: AC
  Administered 2023-07-24: 10 mL

## 2023-07-24 MED ORDER — PEMETREXED DISODIUM CHEMO 500 MG/20ML IV SOLN
400.0000 mg/m2 | Freq: Once | INTRAVENOUS | Status: AC
  Administered 2023-07-24: 600 mg via INTRAVENOUS
  Filled 2023-07-24: qty 20

## 2023-07-24 MED ORDER — CYANOCOBALAMIN 1000 MCG/ML IJ SOLN
1000.0000 ug | Freq: Once | INTRAMUSCULAR | Status: AC
  Administered 2023-07-24: 1000 ug via INTRAMUSCULAR
  Filled 2023-07-24: qty 1

## 2023-07-24 MED ORDER — PROCHLORPERAZINE MALEATE 10 MG PO TABS
10.0000 mg | ORAL_TABLET | Freq: Once | ORAL | Status: AC
Start: 2023-07-24 — End: 2023-07-24
  Administered 2023-07-24: 10 mg via ORAL
  Filled 2023-07-24: qty 1

## 2023-07-24 MED ORDER — SODIUM CHLORIDE 0.9 % IV SOLN
Freq: Once | INTRAVENOUS | Status: AC
Start: 2023-07-24 — End: 2023-07-24

## 2023-07-24 MED ORDER — SODIUM CHLORIDE 0.9 % IV SOLN
200.0000 mg | Freq: Once | INTRAVENOUS | Status: AC
  Administered 2023-07-24: 200 mg via INTRAVENOUS
  Filled 2023-07-24: qty 200

## 2023-07-24 MED ORDER — SODIUM CHLORIDE 0.9% FLUSH
10.0000 mL | INTRAVENOUS | Status: DC | PRN
  Administered 2023-07-24: 10 mL

## 2023-07-24 NOTE — Patient Instructions (Signed)
 [facility] - A DEPT OF [facility]  Discharge Instructions: Thank you for choosing [facility] to provide your oncology and hematology care.   If you have a lab appointment with the Cancer Center, please go directly to the Cancer Center and check in at the registration area.   Wear comfortable clothing and clothing appropriate for easy access to any Portacath or PICC line.   We strive to give you quality time with your provider. You may need to reschedule your appointment if you arrive late (15 or more minutes).  Arriving late affects you and other patients whose appointments are after yours.  Also, if you miss three or more appointments without notifying the office, you may be dismissed from the clinic at the provider's discretion.      For prescription refill requests, have your pharmacy contact our office and allow 72 hours for refills to be completed.    Today you received the following chemotherapy and/or immunotherapy agents: Keytruda, Alimta.       To help prevent nausea and vomiting after your treatment, we encourage you to take your nausea medication as directed.  BELOW ARE SYMPTOMS THAT SHOULD BE REPORTED IMMEDIATELY: *FEVER GREATER THAN 100.4 F (38 C) OR HIGHER *CHILLS OR SWEATING *NAUSEA AND VOMITING THAT IS NOT CONTROLLED WITH YOUR NAUSEA MEDICATION *UNUSUAL SHORTNESS OF BREATH *UNUSUAL BRUISING OR BLEEDING *URINARY PROBLEMS (pain or burning when urinating, or frequent urination) *BOWEL PROBLEMS (unusual diarrhea, constipation, pain near the anus) TENDERNESS IN MOUTH AND THROAT WITH OR WITHOUT PRESENCE OF ULCERS (sore throat, sores in mouth, or a toothache) UNUSUAL RASH, SWELLING OR PAIN  UNUSUAL VAGINAL DISCHARGE OR ITCHING   Items with * indicate a potential emergency and should be followed up as soon as possible or go to the Emergency Department if any problems should occur.  Please show the CHEMOTHERAPY ALERT CARD or  IMMUNOTHERAPY ALERT CARD at check-in to the Emergency Department and triage nurse.  Should you have questions after your visit or need to cancel or reschedule your appointment, please contact [facility] - A DEPT OF [facility]  Dept: [phone]  and follow the prompts.  Office hours are 8:00 a.m. to 4:30 p.m. Monday - Friday. Please note that voicemails left after 4:00 p.m. may not be returned until the following business day.  We are closed weekends and major holidays. You have access to a nurse at all times for urgent questions. Please call the main number to the clinic Dept: [phone] and follow the prompts.   For any non-urgent questions, you may also contact your provider using MyChart. We now offer e-Visits for anyone 61 and older to request care online for non-urgent symptoms. For details visit [email or web address].   Also download the MyChart app! Go to the app store, search "MyChart", open the app, select , and log in with your MyChart username and password.

## 2023-07-25 LAB — T4: T4, Total: 8.6 ug/dL (ref 4.5–12.0)

## 2023-07-28 ENCOUNTER — Other Ambulatory Visit (INDEPENDENT_AMBULATORY_CARE_PROVIDER_SITE_OTHER)

## 2023-07-28 ENCOUNTER — Encounter: Payer: Self-pay | Admitting: Physician Assistant

## 2023-07-28 ENCOUNTER — Ambulatory Visit: Payer: BC Managed Care – PPO | Admitting: Physician Assistant

## 2023-07-28 ENCOUNTER — Telehealth: Payer: Self-pay

## 2023-07-28 VITALS — BP 100/60 | HR 104 | Ht 59.5 in | Wt 104.5 lb

## 2023-07-28 DIAGNOSIS — E440 Moderate protein-calorie malnutrition: Secondary | ICD-10-CM

## 2023-07-28 DIAGNOSIS — R634 Abnormal weight loss: Secondary | ICD-10-CM

## 2023-07-28 DIAGNOSIS — R110 Nausea: Secondary | ICD-10-CM

## 2023-07-28 DIAGNOSIS — J449 Chronic obstructive pulmonary disease, unspecified: Secondary | ICD-10-CM

## 2023-07-28 DIAGNOSIS — D539 Nutritional anemia, unspecified: Secondary | ICD-10-CM

## 2023-07-28 DIAGNOSIS — Z87891 Personal history of nicotine dependence: Secondary | ICD-10-CM

## 2023-07-28 DIAGNOSIS — R630 Anorexia: Secondary | ICD-10-CM | POA: Diagnosis not present

## 2023-07-28 DIAGNOSIS — D649 Anemia, unspecified: Secondary | ICD-10-CM

## 2023-07-28 DIAGNOSIS — Z8673 Personal history of transient ischemic attack (TIA), and cerebral infarction without residual deficits: Secondary | ICD-10-CM

## 2023-07-28 DIAGNOSIS — C3491 Malignant neoplasm of unspecified part of right bronchus or lung: Secondary | ICD-10-CM

## 2023-07-28 DIAGNOSIS — Z85118 Personal history of other malignant neoplasm of bronchus and lung: Secondary | ICD-10-CM

## 2023-07-28 LAB — IBC + FERRITIN
Ferritin: 174.3 ng/mL (ref 10.0–291.0)
Iron: 133 ug/dL (ref 42–145)
Saturation Ratios: 44.6 % (ref 20.0–50.0)
TIBC: 298.2 ug/dL (ref 250.0–450.0)
Transferrin: 213 mg/dL (ref 212.0–360.0)

## 2023-07-28 MED ORDER — NA SULFATE-K SULFATE-MG SULF 17.5-3.13-1.6 GM/177ML PO SOLN: 1.0000 | Freq: Once | ORAL | 0 refills | Status: AC

## 2023-07-28 NOTE — Telephone Encounter (Signed)
 Clearance made and faxed to Dr Pearlean Brownie at [phone]

## 2023-07-28 NOTE — Patient Instructions (Addendum)
 Your provider has requested that you go to the basement level for lab work before leaving today. Press "B" on the elevator. The lab is located at the first door on the left as you exit the elevator.   You can add on protein and things high calorie for weight gain - Can add ensure/boost to ice cream for a shake - can add protein powder to oatmeal after cooked or to a fruit smooth - avocado has high calorie, good to add to proteins - nuts and peanut butter are good to eat or add to yogurt/smoothies - [place] yogurt is good  You will be contacted by our office prior to your procedure for directions on holding your blood thinner.  If you do not hear from our office 2 week prior to your scheduled procedure, please call [phone] to discuss.    We have sent the following medications to your pharmacy for you to pick up at your convenience: Suprep  You have been scheduled for an endoscopy and colonoscopy. Please follow the written instructions given to you at your visit today.  If you use inhalers (even only as needed), please bring them with you on the day of your procedure.  DO NOT TAKE 7 DAYS PRIOR TO TEST- Trulicity (dulaglutide) Ozempic, Wegovy (semaglutide) Mounjaro (tirzepatide) Bydureon Bcise (exanatide extended release)  DO NOT TAKE 1 DAY PRIOR TO YOUR TEST Rybelsus (semaglutide) Adlyxin (lixisenatide) Victoza (liraglutide) Byetta (exanatide) ___________________________________________________________________________  It was a pleasure to see you today!  Thank you for trusting me with your gastrointestinal care!

## 2023-07-28 NOTE — Progress Notes (Signed)
 07/28/2023 April Walsh [id number] [birth year]  Referring provider: [name], * Primary GI doctor: Dr. Barron Alvine  ASSESSMENT AND PLAN:  Macrocytic anemia 07/24/2023  HGB 9.4 MCV 115.3 Platelets 344 04/24/2023 Iron 35 Ferritin 120 B12 1,132 was on prenatal vitamin with iron, stopped last week Negative stool cards , has never had colon cancer screening, no family history of colon cancer History of blood transfusion after christmas, no overt GI bleeding Recent Labs    02/20/23 0941 03/12/23 1056 04/03/23 1043 04/24/23 0845 05/15/23 0917 05/22/23 0826 06/12/23 1349 06/19/23 0748 07/03/23 1224 07/24/23 0927  HGB 10.2* 9.7* 9.1* 8.3* 8.2* 11.0* 9.7* 9.3* 9.5* 9.4*  Could be secondary to bone marrow suppression with chemotherapy, but we will plan on EGD and colonoscopy to evaluate further. Risk of bowel prep, conscious sedation, and EGD and colonoscopy were discussed.  Risks include but are not limited to dehydration, pain, bleeding, cardiopulmonary process, bowel perforation, or other possible adverse outcomes..  Treatment plan was discussed with patient, and agreed upon.  Nausea, decreased appetite, weight loss Can be secondary to chemotherapy She is on compazine and Remeron that helps Was on chronic goody powders in the past, stopped 2 years ago, still takes once a month, no ETOH use  COPD  No recent exacerbation not on O2, controlled with inhalers  Patient has history of CVA  Eliquis 2.5 mg twice daily Dr. Pearlean Brownie in neurology Hold Eliquis for 2 days before procedure will instruct when and how to resume after procedure.  Patient understands that there is a low but real risk of cardiovascular event such as heart attack, stroke, or embolism /  thrombosis, or ischemia while off Eliquis  The patient consents to proceed.  Will communicate by phone or EMR with patient's prescribing provider to confirm that holding Eliquis is reasonable in this case.   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 Shirline Frees Status post palliative radiotherapy Systemic chemotherapy  moderate protein calorie malnutrition Albumin 07/24/2023  3.0  BMI body mass index is 20.75 kg/m.  Secondary to  cancer Count calories, increase protein   Patient Care Team: [name], FNP as PCP - General (Family Medicine)  HISTORY OF PRESENT ILLNESS: Discussed the use of AI scribe software for clinical note transcription with the patient, who gave verbal consent to proceed.  History of Present Illness   April Walsh is a 58 year old female with stage four non-small cell lung cancer who presents with anemia. She was referred for anemia.  She has experienced anemia since her pregnancies over 41 years ago, with recent episodes of severe fatigue and weakness, particularly after Christmas, necessitating a blood transfusion due to critically low hemoglobin levels. Her hemoglobin levels have recently fluctuated between 9.2 and 9.5. She is not currently using iron supplements and discontinued prenatal vitamins a week ago due to perceived ineffectiveness.  No gastrointestinal bleeding, with negative hemocult cards and no history of colon cancer screening. She has experienced significant weight loss from 110 to 104 pounds over three weeks, attributed to chemotherapy. Nausea is managed with Compazine, taken once before bed or before meals if nausea worsens. She has decreased appetite and early satiety, but no vomiting, heartburn, or difficulty swallowing. Regular bowel habits with no changes, diarrhea, or constipation.  She has stage four non-small cell lung cancer, adenocarcinoma, diagnosed in April 2023, with a right lung mass, abdominal  lymphadenopathy, bilateral adrenal metastasis, and metastasis to the musculature of the lower  back. She has undergone palliative radiotherapy and systemic chemotherapy, which is expected to be lifelong.  Her past medical history includes COPD, managed with albuterol solution, a rescue inhaler, and Breezhaler. No worsening shortness of breath or chest discomfort, though increased oxygen needs post-chemotherapy are noted.  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  She denies alcohol use and reports occasional use of Goody Powders, significantly reduced two years ago after her cancer diagnosis. No current swelling in her legs, previously managed with compression socks.        She  reports that she quit smoking about 1 years ago. Her smoking use included cigarettes. She has a 16 pack-year smoking history. She has been exposed to tobacco smoke. She has never used smokeless tobacco. She reports that she does not drink alcohol and does not use drugs.  RELEVANT GI HISTORY, IMAGING AND LABS: Results   LABS Hemoglobin: 9.5 Iron: 35 (04/2023)      CBC    Component Value Date/Time   WBC 7.7 07/24/2023 0927   WBC 7.1 09/12/2021 0340   RBC 2.49 (L) 07/24/2023 0927   HGB 9.4 (L) 07/24/2023 0927   HCT 28.7 (L) 07/24/2023 0927   PLT 344 07/24/2023 0927   MCV 115.3 (H) 07/24/2023 0927   MCH 37.8 (H) 07/24/2023 0927   MCHC 32.8 07/24/2023 0927   RDW 16.8 (H) 07/24/2023 0927   LYMPHSABS 1.1 07/24/2023 0927   MONOABS 1.0 07/24/2023 0927   EOSABS 0.1 07/24/2023 0927   BASOSABS 0.0 07/24/2023 0927   Recent Labs    02/20/23 0941 03/12/23 1056 04/03/23 1043 04/24/23 0845 05/15/23 0917 05/22/23 0826 06/12/23 1349 06/19/23 0748 07/03/23 1224 07/24/23 0927  HGB 10.2* 9.7* 9.1* 8.3* 8.2* 11.0* 9.7* 9.3* 9.5* 9.4*    CMP     Component Value Date/Time   NA 137 07/24/2023 0927   K 3.8 07/24/2023 0927   CL 106 07/24/2023 0927   CO2 26 07/24/2023 0927   GLUCOSE 78 07/24/2023 0927   BUN 13 07/24/2023 0927    CREATININE 0.94 07/24/2023 0927   CALCIUM 8.6 (L) 07/24/2023 0927   PROT 6.7 07/24/2023 0927   ALBUMIN 3.0 (L) 07/24/2023 0927   AST 27 07/24/2023 0927   ALT 14 07/24/2023 0927   ALKPHOS 78 07/24/2023 0927   BILITOT 0.2 07/24/2023 0927   GFRNONAA >60 07/24/2023 0927      Latest Ref Rng & Units 07/24/2023    9:27 AM 07/03/2023   12:24 PM 06/12/2023    1:49 PM  Hepatic Function  Total Protein 6.5 - 8.1 g/dL 6.7  6.1  6.1   Albumin 3.5 - 5.0 g/dL 3.0  2.9  2.8   AST 15 - 41 U/L 27  32  22   ALT 0 - 44 U/L 14  18  10    Alk Phosphatase 38 - 126 U/L 78  77  77   Total Bilirubin 0.0 - 1.2 mg/dL 0.2  0.2  0.2       Current Medications:     Current Outpatient Medications (Cardiovascular):    atorvastatin (LIPITOR) 80 MG tablet, TAKE 1 TABLET BY MOUTH EVERY DAY   Current Outpatient Medications (Respiratory):    albuterol (ACCUNEB) 0.63 MG/3ML nebulizer solution, Take 3 mLs (0.63 mg total) by nebulization every 4 (four) hours as needed for wheezing or shortness  of breath.   albuterol (VENTOLIN HFA) 108 (90 Base) MCG/ACT inhaler, Inhale 2 puffs into the lungs every 6 (six) hours as needed for wheezing or shortness of breath.   Budeson-Glycopyrrol-Formoterol (BREZTRI AEROSPHERE) 160-9-4.8 MCG/ACT AERO, Inhale 2 puffs into the lungs in the morning and at bedtime.     Current Outpatient Medications (Hematological):    apixaban (ELIQUIS) 2.5 MG TABS tablet, Take 1 tablet (2.5 mg total) by mouth 2 (two) times daily.   folic acid (FOLVITE) 1 MG tablet, TAKE 1 TABLET BY MOUTH EVERY DAY   Facility-Administered Medications Ordered in Other Visits (Hematological):    cyanocobalamin (VITAMIN B12) 1000 MCG/ML injection  Current Outpatient Medications (Other):    lidocaine-prilocaine (EMLA) cream, APPLY TOPICALLY 1 APPLICATION AS NEEDED   mirtazapine (REMERON) 7.5 MG tablet, Take 1 tablet (7.5 mg total) by mouth at bedtime.   prochlorperazine (COMPAZINE) 10 MG tablet, Take 1 tablet (10 mg  total) by mouth every 6 (six) hours as needed.   Facility-Administered Medications Ordered in Other Visits (Other):    prochlorperazine (COMPAZINE) 10 MG tablet No current facility-administered medications for this visit.  Medical History:  Past Medical History:  Diagnosis Date   Anemia    Asthma    Basal cell carcinoma    Bronchitis    Cervical cancer (HCC)    COPD (chronic obstructive pulmonary disease) (HCC)    "early stages of COPD"   Headache    HLD (hyperlipidemia)    Lung cancer (HCC) 08/13/2021   Allergies: No Known Allergies   Surgical History:  She  has a past surgical history that includes Cesarean section; Tubal ligation; Partial hysterectomy; Appendectomy; Foot surgery (Right); Breast biopsy (Right); Melanoma excision (Left); Foot surgery (Left, 06/21/2021); Video bronchoscopy with endobronchial ultrasound (N/A, 08/13/2021); Video bronchoscopy (08/13/2021); Bronchial biopsy (08/13/2021); Hemostasis control (08/13/2021); Bronchial brushings (08/13/2021); Bronchial needle aspiration biopsy (08/13/2021); and IR IMAGING GUIDED PORT INSERTION (11/26/2021). Family History:  Her family history includes Brain cancer in her maternal aunt; Bronchitis in her son; Diabetes in her mother; Heart failure in her mother; Hyperlipidemia in her mother; Hypertension in her mother; Lung cancer in her cousin, paternal uncle, and paternal uncle; Ovarian cancer in her maternal grandmother; Seizures in her daughter; Throat cancer in her maternal aunt.  REVIEW OF SYSTEMS  : All other systems reviewed and negative except where noted in the History of Present Illness.  PHYSICAL EXAM: BP 100/60 (BP Location: Left Arm, Patient Position: Sitting, Cuff Size: Normal)   Pulse (!) 104   Ht 4' 11.5" (1.511 m) Comment: height measured without shoes  Wt 104 lb 8 oz (47.4 kg)   BMI 20.75 kg/m  Physical Exam   GENERAL APPEARANCE: thin appearing, in no apparent distress. HEENT: No cervical lymphadenopathy,  unremarkable thyroid, sclerae anicteric, conjunctiva pink. RESPIRATORY: Respiratory effort normal, breath sounds clear to auscultation bilaterally without rales, rhonchi, or wheezing. CARDIO: Regular rate and rhythm with no murmurs, rubs, or gallops, peripheral pulses intact. ABDOMEN: Soft, non-distended, active bowel sounds in all four quadrants, mild tenderness to palpation, no rebound, no mass appreciated. RECTAL: Declines. MUSCULOSKELETAL: Full range of motion, normal gait, without edema, in compression socks SKIN: Dry, intact without rashes or lesions. No jaundice. NEURO: Alert, oriented, no focal deficits. PSYCH: Cooperative, normal mood and affect. EXTREMITIES: No edema.      Doree Albee, PA-C 10:26 AM

## 2023-07-29 ENCOUNTER — Encounter: Payer: Self-pay | Admitting: Neurology

## 2023-08-05 NOTE — Progress Notes (Signed)
 Agree with the assessment and plan as outlined by Quentin Mulling, PA-C. ? ?Keron Neenan, DO, FACG ? ?

## 2023-08-06 ENCOUNTER — Telehealth: Payer: Self-pay | Admitting: Neurology

## 2023-08-06 NOTE — Telephone Encounter (Signed)
 Brooke @[place]  states she was told on 3-19 the cardiac clearance form was on Dr [employer], she is calling for the status re: it being completed, she can be reached at [phone] her fax # is [phone]

## 2023-08-06 NOTE — Telephone Encounter (Signed)
 Letter completed and signed. Faxed to [employer] - [place] [facility] on 3/26. Confirmation rec'd fax completed.

## 2023-08-07 NOTE — Telephone Encounter (Signed)
 Clearance given for patient to hold her blood thinner  3 days prior to the procedure by Dr Pearlean Brownie.  Called patient to let her know we will hold it 2 days prior. It has ben sent to scan and in chart under letters

## 2023-08-07 NOTE — Telephone Encounter (Signed)
 Patient made aware and voiced understanding.

## 2023-08-12 IMAGING — MR MR HEAD WO/W CM
15 series · 48 of 48 positions shown · IV contrast (gadavist)
Comparison: PET-CT 08/17/2021

CLINICAL DATA: Provided history: Non-small cell lung cancer,
staging. Malignant neoplasm of unspecified part of unspecified
bronchus or lung.

EXAM:
MRI HEAD WITHOUT AND WITH CONTRAST
TECHNIQUE: Multiplanar, multiecho pulse sequences of the brain and surrounding
structures were obtained without and with intravenous contrast.
CONTRAST:  5mL GADAVIST GADOBUTROL 1 MMOL/ML IV SOLN

[Series 5: DWI · axial · 3.0mm · 1.36mm/px · z∈[-29,+111]mm · 6 of 96 slices shown (1 of 2)]
[im 1/96]
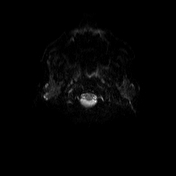
[im 20/96]
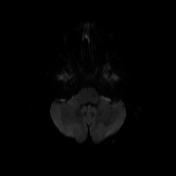
[im 39/96]
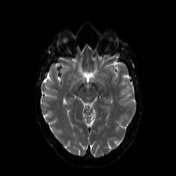
[im 58/96]
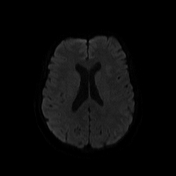
[im 77/96]
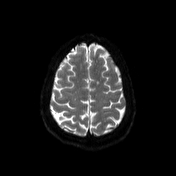
[im 96/96]
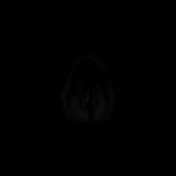

[Series 6: DWI · axial · 3.0mm · 1.36mm/px · z∈[-29,+111]mm · 3 of 48 slices shown (2 of 2)]
[im 1/48]
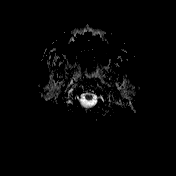
[im 24/48]
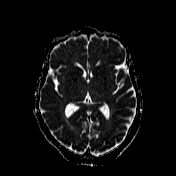
[im 48/48]
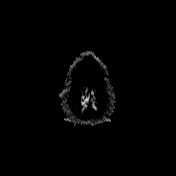

[Series 7: T1 · sagittal · 5.0mm · 0.75mm/px · 1 of 24 slices shown (1 of 4)]
[im 1/24]
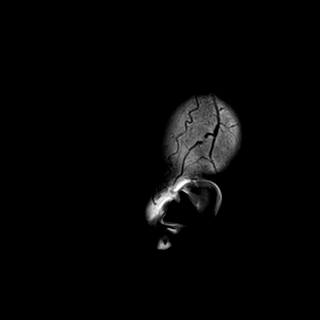

[Series 8: T2 · axial · 5.0mm · 0.62mm/px · 1 of 24 slices shown]
[im 1/24]
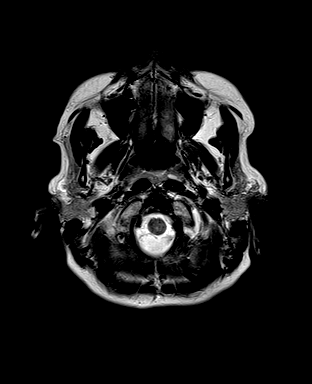

[Series 9: swi_images · axial · 3.0mm · 0.75mm/px · z∈[-22,+119]mm · 3 of 48 slices shown]
[im 1/48]
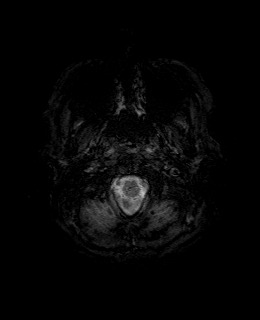
[im 24/48]
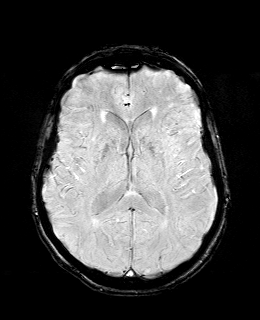
[im 48/48]
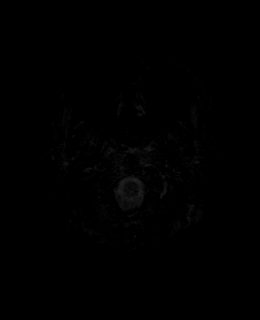

[Series 11: FLAIR · axial · 3.0mm · 0.75mm/px · z∈[-28,+124]mm · 3 of 52 slices shown]
[im 1/52]
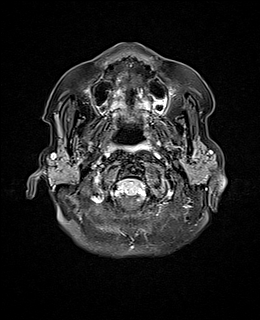
[im 26/52]
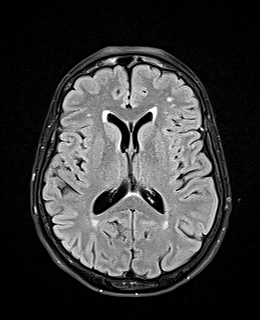
[im 52/52]
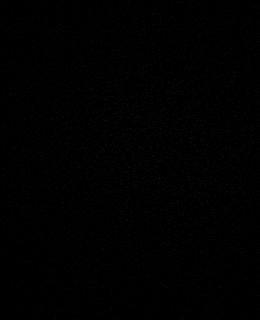

[Series 12: T1 · axial · 1.0mm · 0.94mm/px · z∈[-27,+115]mm · 9 of 144 slices shown (2 of 4)]
[im 1/144]
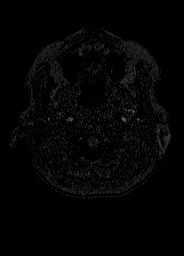
[im 18/144]
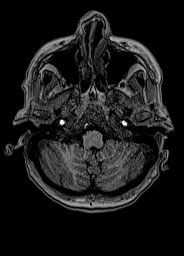
[im 36/144]
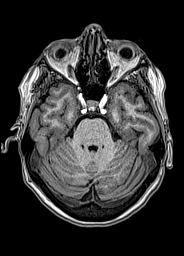
[im 54/144]
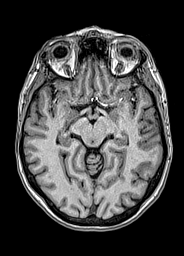
[im 72/144]
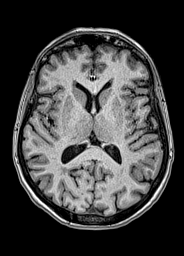
[im 90/144]
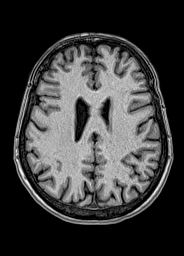
[im 108/144]
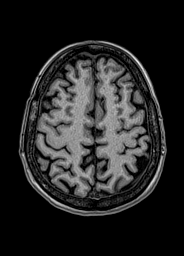
[im 126/144]
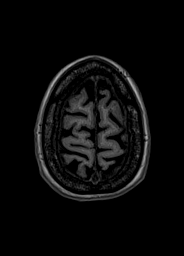
[im 144/144]
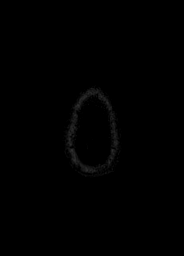

[Series 13: cor dwi_tracew · coronal · 5.0mm · 1.53mm/px · 3 of 48 slices shown]
[im 1/48]
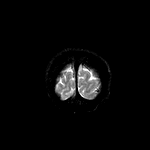
[im 24/48]
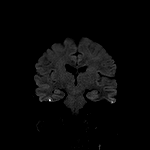
[im 48/48]
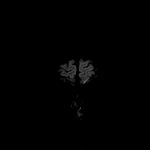

[Series 14: cor dwi_adc · coronal · 5.0mm · 1.53mm/px · 1 of 24 slices shown]
[im 1/24]
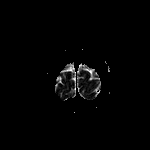

[Series 15: T2 post-contrast · coronal · 5.0mm · 0.57mm/px · 2 of 26 slices shown]
[im 1/26]
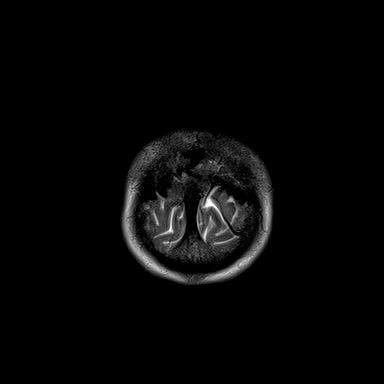
[im 26/26]
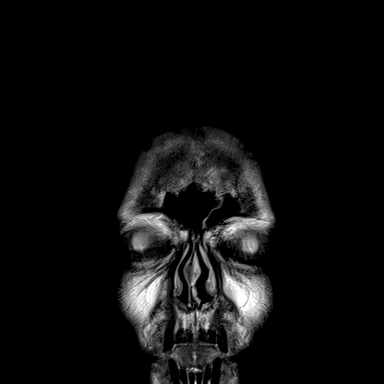

[Series 16: T1 post-contrast · axial · 1.0mm · 0.94mm/px · z∈[-27,+115]mm · 9 of 144 slices shown (1 of 3)]
[im 1/144]
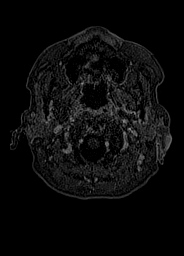
[im 18/144]
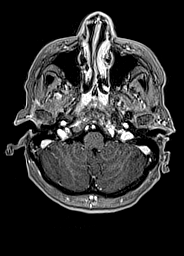
[im 36/144]
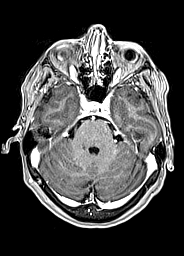
[im 54/144]
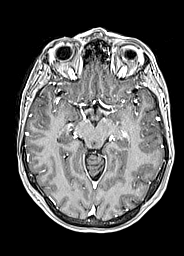
[im 72/144]
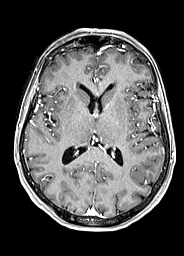
[im 90/144]
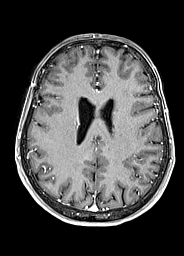
[im 108/144]
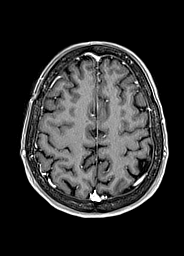
[im 126/144]
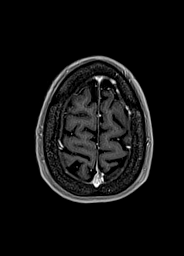
[im 144/144]
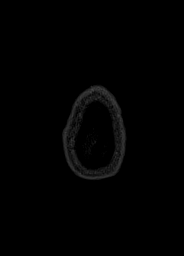

[Series 17: T1 · sagittal · 4.0mm · 0.94mm/px · 2 of 30 slices shown (3 of 4)]
[im 1/30]
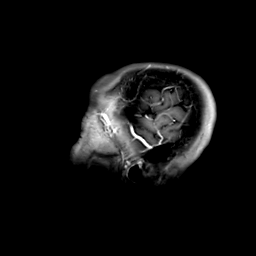
[im 30/30]
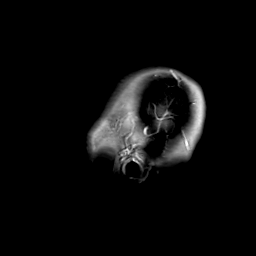

[Series 18: T1 · coronal · 4.0mm · 0.94mm/px · 2 of 39 slices shown (4 of 4)]
[im 1/39]
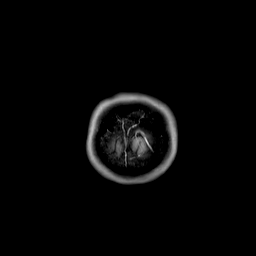
[im 39/39]
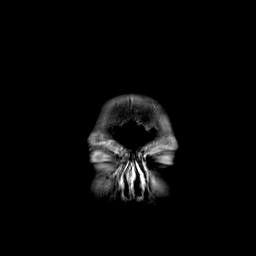

[Series 19: T1 post-contrast · coronal · 5.0mm · 0.43mm/px · 2 of 26 slices shown (2 of 3)]
[im 1/26]
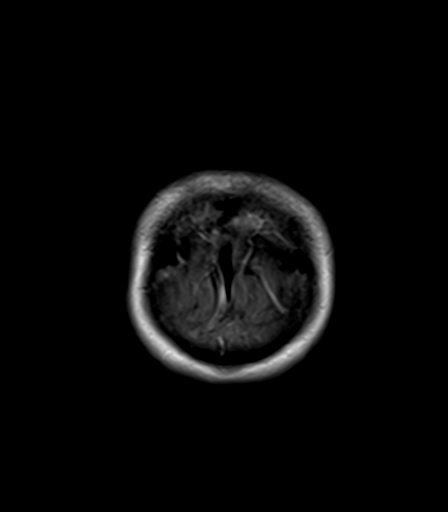
[im 26/26]
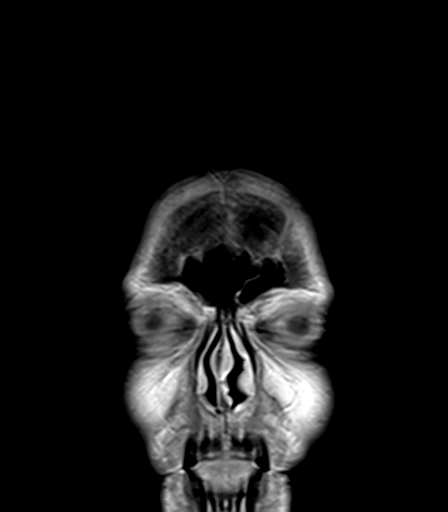

[Series 20: T1 post-contrast · sagittal · 5.0mm · 0.75mm/px · 1 of 24 slices shown (3 of 3)]
[im 1/24]
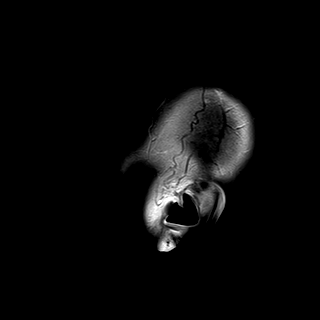

[48 of 48 positions shown; findings below may reference images not displayed]

FINDINGS: Brain:

No age advanced or lobar predominant parenchymal atrophy.

Mild multifocal T2 FLAIR hyperintense signal abnormality within the
cerebral white matter, nonspecific but most often secondary to
chronic small vessel ischemia.

There is no acute infarct.

No evidence of an intracranial mass.

No chronic intracranial blood products.

No extra-axial fluid collection.

No midline shift.

No pathologic intracranial enhancement identified.

Vascular: Maintained flow voids within the proximal large arterial
vessels.

Skull and upper cervical spine: No focal suspicious marrow lesion.
Incompletely assessed cervical spondylosis.

Sinuses/Orbits: Visualized orbits show no acute finding. No
significant paranasal sinus disease.

Other: Small-volume fluid within the bilateral mastoid air cells.
IMPRESSION: No evidence of intracranial metastatic disease.

Mild multifocal T2 FLAIR hyperintense signal abnormality within the
cerebral white matter, nonspecific but most often secondary to
chronic small vessel ischemia.

Small-volume fluid within the bilateral mastoid air cells

## 2023-08-14 ENCOUNTER — Inpatient Hospital Stay: Payer: BC Managed Care – PPO | Attending: Radiation Oncology

## 2023-08-14 ENCOUNTER — Inpatient Hospital Stay: Payer: BC Managed Care – PPO

## 2023-08-14 ENCOUNTER — Inpatient Hospital Stay (HOSPITAL_BASED_OUTPATIENT_CLINIC_OR_DEPARTMENT_OTHER): Payer: BC Managed Care – PPO | Admitting: Internal Medicine

## 2023-08-14 VITALS — BP 119/76 | HR 108 | Temp 98.1°F | Resp 16 | Ht 59.5 in | Wt 108.8 lb

## 2023-08-14 VITALS — HR 98

## 2023-08-14 DIAGNOSIS — C3491 Malignant neoplasm of unspecified part of right bronchus or lung: Secondary | ICD-10-CM

## 2023-08-14 DIAGNOSIS — Z9851 Tubal ligation status: Secondary | ICD-10-CM | POA: Diagnosis not present

## 2023-08-14 DIAGNOSIS — C7971 Secondary malignant neoplasm of right adrenal gland: Secondary | ICD-10-CM | POA: Diagnosis not present

## 2023-08-14 DIAGNOSIS — Z85820 Personal history of malignant melanoma of skin: Secondary | ICD-10-CM | POA: Insufficient documentation

## 2023-08-14 DIAGNOSIS — Z8541 Personal history of malignant neoplasm of cervix uteri: Secondary | ICD-10-CM | POA: Diagnosis not present

## 2023-08-14 DIAGNOSIS — D6481 Anemia due to antineoplastic chemotherapy: Secondary | ICD-10-CM | POA: Diagnosis not present

## 2023-08-14 DIAGNOSIS — T451X5D Adverse effect of antineoplastic and immunosuppressive drugs, subsequent encounter: Secondary | ICD-10-CM | POA: Diagnosis not present

## 2023-08-14 DIAGNOSIS — Z7901 Long term (current) use of anticoagulants: Secondary | ICD-10-CM | POA: Insufficient documentation

## 2023-08-14 DIAGNOSIS — Z95828 Presence of other vascular implants and grafts: Secondary | ICD-10-CM

## 2023-08-14 DIAGNOSIS — Z9049 Acquired absence of other specified parts of digestive tract: Secondary | ICD-10-CM | POA: Insufficient documentation

## 2023-08-14 DIAGNOSIS — Z5112 Encounter for antineoplastic immunotherapy: Secondary | ICD-10-CM | POA: Diagnosis not present

## 2023-08-14 DIAGNOSIS — Z923 Personal history of irradiation: Secondary | ICD-10-CM | POA: Insufficient documentation

## 2023-08-14 DIAGNOSIS — C7972 Secondary malignant neoplasm of left adrenal gland: Secondary | ICD-10-CM | POA: Insufficient documentation

## 2023-08-14 DIAGNOSIS — Z5111 Encounter for antineoplastic chemotherapy: Secondary | ICD-10-CM | POA: Insufficient documentation

## 2023-08-14 LAB — SAMPLE TO BLOOD BANK

## 2023-08-14 LAB — CMP (CANCER CENTER ONLY)
ALT: 19 U/L (ref 0–44)
AST: 33 U/L (ref 15–41)
Albumin: 2.9 g/dL — ABNORMAL LOW (ref 3.5–5.0)
Alkaline Phosphatase: 86 U/L (ref 38–126)
Anion gap: 4 — ABNORMAL LOW (ref 5–15)
BUN: 15 mg/dL (ref 6–20)
CO2: 27 mmol/L (ref 22–32)
Calcium: 8.6 mg/dL — ABNORMAL LOW (ref 8.9–10.3)
Chloride: 105 mmol/L (ref 98–111)
Creatinine: 0.86 mg/dL (ref 0.44–1.00)
GFR, Estimated: >60 mL/min (ref >60)
Glucose, Bld: 82 mg/dL (ref 70–99)
Potassium: 4.5 mmol/L (ref 3.5–5.1)
Sodium: 136 mmol/L (ref 135–145)
Total Bilirubin: 0.2 mg/dL (ref 0.0–1.2)
Total Protein: 6.4 g/dL — ABNORMAL LOW (ref 6.5–8.1)

## 2023-08-14 LAB — CBC WITH DIFFERENTIAL (CANCER CENTER ONLY)
Abs Immature Granulocytes: 0.12 10*3/uL — ABNORMAL HIGH (ref 0.00–0.07)
Basophils Absolute: 0 10*3/uL (ref 0.0–0.1)
Basophils Relative: 0 %
Eosinophils Absolute: 0.1 10*3/uL (ref 0.0–0.5)
Eosinophils Relative: 2 %
HCT: 29.5 % — ABNORMAL LOW (ref 36.0–46.0)
Hemoglobin: 9.6 g/dL — ABNORMAL LOW (ref 12.0–15.0)
Immature Granulocytes: 2 %
Lymphocytes Relative: 19 %
Lymphs Abs: 1.4 10*3/uL (ref 0.7–4.0)
MCH: 38.6 pg — ABNORMAL HIGH (ref 26.0–34.0)
MCHC: 32.5 g/dL (ref 30.0–36.0)
MCV: 118.5 fL — ABNORMAL HIGH (ref 80.0–100.0)
Monocytes Absolute: 1.2 10*3/uL — ABNORMAL HIGH (ref 0.1–1.0)
Monocytes Relative: 17 %
Neutro Abs: 4.2 10*3/uL (ref 1.7–7.7)
Neutrophils Relative %: 60 %
Platelet Count: 454 10*3/uL — ABNORMAL HIGH (ref 150–400)
RBC: 2.49 MIL/uL — ABNORMAL LOW (ref 3.87–5.11)
RDW: 17.3 % — ABNORMAL HIGH (ref 11.5–15.5)
WBC Count: 7 10*3/uL (ref 4.0–10.5)
nRBC: 0 % (ref 0.0–0.2)

## 2023-08-14 MED ORDER — SODIUM CHLORIDE 0.9% FLUSH
10.0000 mL | INTRAVENOUS | Status: DC | PRN
  Administered 2023-08-14: 10 mL

## 2023-08-14 MED ORDER — SODIUM CHLORIDE 0.9 % IV SOLN: Freq: Once | INTRAVENOUS | Status: AC

## 2023-08-14 MED ORDER — PROCHLORPERAZINE MALEATE 10 MG PO TABS
10.0000 mg | ORAL_TABLET | Freq: Once | ORAL | Status: AC
  Administered 2023-08-14: 10 mg via ORAL
  Filled 2023-08-14: qty 1

## 2023-08-14 MED ORDER — SODIUM CHLORIDE 0.9 % IV SOLN
400.0000 mg/m2 | Freq: Once | INTRAVENOUS | Status: AC
Start: 2023-08-14 — End: 2023-08-14
  Administered 2023-08-14: 600 mg via INTRAVENOUS
  Filled 2023-08-14: qty 20

## 2023-08-14 MED ORDER — HEPARIN SOD (PORK) LOCK FLUSH 100 UNIT/ML IV SOLN
500.0000 [IU] | Freq: Once | INTRAVENOUS | Status: AC | PRN
Start: 2023-08-14 — End: 2023-08-14
  Administered 2023-08-14: 500 [IU]

## 2023-08-14 MED ORDER — SODIUM CHLORIDE 0.9% FLUSH
10.0000 mL | Freq: Once | INTRAVENOUS | Status: AC
Start: 2023-08-14 — End: 2023-08-14
  Administered 2023-08-14: 10 mL

## 2023-08-14 MED ORDER — SODIUM CHLORIDE 0.9 % IV SOLN
200.0000 mg | Freq: Once | INTRAVENOUS | Status: AC
  Administered 2023-08-14: 200 mg via INTRAVENOUS
  Filled 2023-08-14: qty 200

## 2023-08-14 NOTE — Progress Notes (Signed)
 [facility] [phone]   Fax:([phone]  OFFICE PROGRESS NOTE  [name], FNP No address on file  DIAGNOSIS: Stage IV (T2b, N2, M1 C) non-small cell lung cancer, adenocarcinoma presented with right hilar mass in addition to mediastinal and upper abdominal lymphadenopathy in addition to bilateral adrenal metastases and metastatic disease in the musculature of the lower back.  This was diagnosed in April 2023.  DETECTED ALTERATION(S) / BIOMARKER(S) % CFDNA OR AMPLIFICATION ASSOCIATED FDA-APPROVED THERAPIES CLINICAL TRIAL AVAILABILITY [id number] 0.5% None  Yes [id number] (Splice Site Indel) 0.6% None  Yes  PD-L1 expression 0%  PRIOR THERAPY: Palliative radiotherapy to the right hilar region under the care of Dr. Mitzi Hansen.  CURRENT THERAPY:  systemic chemotherapy with carboplatin for AUC of 5, Alimta 500 Mg/M2 and Keytruda 200 Mg IV every 3 weeks.  First dose August 29, 2021.  Status post 33 cycles.  Starting from cycle #5 the patient is on maintenance treatment with Alimta and Keytruda every 3 weeks.  INTERVAL HISTORY: April Walsh 58 y.o. female returns to the clinic today for follow-up visit.Discussed the use of AI scribe software for clinical note transcription with the patient, who gave verbal consent to proceed.  History of Present Illness   April Walsh is a 58 year old female with stage four non-small cell lung cancer who presents for evaluation before starting cycle number 34 of maintenance treatment. She is accompanied by her aunt, Inocencio Homes.  She has stage four non-small cell lung cancer, adenocarcinoma type, diagnosed in April 2023, with no actionable mutations and negative PD-L1 expression. Initially, she underwent systemic chemotherapy with carboplatin, Alimta, and Keytruda for four cycles. Since then, she has been on maintenance treatment with Alimta and Keytruda every three weeks, and she is here for evaluation before  starting cycle number 34.  She experiences worsening dyspnea since her last visit three weeks ago, with audible wheezing during conversation. She uses a rescue inhaler, specifically albuterol, but it has not been effective. She suspects pollen may be contributing to her symptoms but is not using any allergy medication. She wears a mask when going outside to mitigate exposure to pollen.  She has a history of anemia, which she believes contributes to her dyspnea and lack of stamina. She is concerned about her heart rate and mentions feeling 'afraid' due to her anemia, although she does not require a transfusion at this time.       MEDICAL HISTORY: Past Medical History:  Diagnosis Date   Anemia    Asthma    Basal cell carcinoma    Bronchitis    Cervical cancer (HCC)    COPD (chronic obstructive pulmonary disease) (HCC)    "early stages of COPD"   Headache    HLD (hyperlipidemia)    Lung cancer (HCC) 08/13/2021    ALLERGIES:  has no known allergies.  MEDICATIONS:  Current Outpatient Medications  Medication Sig Dispense Refill   albuterol (ACCUNEB) 0.63 MG/3ML nebulizer solution Take 3 mLs (0.63 mg total) by nebulization every 4 (four) hours as needed for wheezing or shortness of breath. 75 mL 11   albuterol (VENTOLIN HFA) 108 (90 Base) MCG/ACT inhaler Inhale 2 puffs into the lungs every 6 (six) hours as needed for wheezing or shortness of breath. 8 g 8   apixaban (ELIQUIS) 2.5 MG TABS tablet Take 1 tablet (2.5 mg total) by mouth 2 (two) times daily. 60 tablet 0   atorvastatin (LIPITOR) 80 MG tablet TAKE 1 TABLET BY  MOUTH EVERY DAY 30 tablet 3   Budeson-Glycopyrrol-Formoterol (BREZTRI AEROSPHERE) 160-9-4.8 MCG/ACT AERO Inhale 2 puffs into the lungs in the morning and at bedtime. 10.7 each 10   folic acid (FOLVITE) 1 MG tablet TAKE 1 TABLET BY MOUTH EVERY DAY 30 tablet 2   lidocaine-prilocaine (EMLA) cream APPLY TOPICALLY 1 APPLICATION AS NEEDED 30 g 2   mirtazapine (REMERON) 7.5 MG  tablet Take 1 tablet (7.5 mg total) by mouth at bedtime. 30 tablet 2   prochlorperazine (COMPAZINE) 10 MG tablet Take 1 tablet (10 mg total) by mouth every 6 (six) hours as needed. 30 tablet 2   No current facility-administered medications for this visit.   Facility-Administered Medications Ordered in Other Visits  Medication Dose Route Frequency Provider Last Rate Last Admin   cyanocobalamin (VITAMIN B12) 1000 MCG/ML injection            prochlorperazine (COMPAZINE) 10 MG tablet             SURGICAL HISTORY:  Past Surgical History:  Procedure Laterality Date   APPENDECTOMY     1996   BREAST BIOPSY Right    2010-2015   BRONCHIAL BIOPSY  08/13/2021   Procedure: BRONCHIAL BIOPSIES;  Surgeon: Leslye Peer, MD;  Location: [facility] ENDOSCOPY;  Service: Pulmonary;;   BRONCHIAL BRUSHINGS  08/13/2021   Procedure: BRONCHIAL BRUSHINGS;  Surgeon: Leslye Peer, MD;  Location: [facility] ENDOSCOPY;  Service: Pulmonary;;   BRONCHIAL NEEDLE ASPIRATION BIOPSY  08/13/2021   Procedure: BRONCHIAL NEEDLE ASPIRATION BIOPSIES;  Surgeon: Leslye Peer, MD;  Location: [facility] ENDOSCOPY;  Service: Pulmonary;;   CESAREAN SECTION     1984, 1987   FOOT SURGERY Right    2 pins and screw 1999   FOOT SURGERY Left 06/21/2021   screw and 2 pins   HEMOSTASIS CONTROL  08/13/2021   Procedure: HEMOSTASIS CONTROL;  Surgeon: Leslye Peer, MD;  Location: [facility] ENDOSCOPY;  Service: Pulmonary;;   IR IMAGING GUIDED PORT INSERTION  11/26/2021   MELANOMA EXCISION Left    basal cell melanoma 2011   PARTIAL HYSTERECTOMY     1994   TUBAL LIGATION     1987   VIDEO BRONCHOSCOPY  08/13/2021   Procedure: VIDEO BRONCHOSCOPY WITHOUT FLUORO;  Surgeon: Leslye Peer, MD;  Location[facility]COPY;  Service: Pulmonary;;   VIDEO BRONCHOSCOPY WITH ENDOBRONCHIAL ULTRASOUND N/A 08/13/2021   Procedure: VIDEO BRONCHOSCOPY WITH ENDOBRONCHIAL ULTRASOUND;  Surgeon: Leslye Peer, MD;  Location: [facility] ENDOSCOPY;  Service: Pulmonary;  Laterality: N/A;    REVIEW  OF SYSTEMS:  A comprehensive review of systems was negative except for: Constitutional: positive for fatigue Respiratory: positive for dyspnea on exertion and wheezing   PHYSICAL EXAMINATION: General appearance: alert, cooperative, fatigued, and no distress Head: Normocephalic, without obvious abnormality, atraumatic Neck: no adenopathy, no JVD, supple, symmetrical, trachea midline, and thyroid not enlarged, symmetric, no tenderness/mass/nodules Lymph nodes: Cervical, supraclavicular, and axillary nodes normal. Resp: wheezes bilaterally Back: symmetric, no curvature. ROM normal. No CVA tenderness. Cardio: regular rate and rhythm, S1, S2 normal, no murmur, click, rub or gallop GI: soft, non-tender; bowel sounds normal; no masses,  no organomegaly Extremities: extremities normal, atraumatic, no cyanosis or edema  ECOG PERFORMANCE STATUS: 1 - Symptomatic but completely ambulatory  Blood pressure 119/76, pulse (!) 108, temperature 98.1 F (36.7 C), temperature source Temporal, resp. rate 16, height 4' 11.5" (1.511 m), weight 108 lb 12.8 oz (49.4 kg), SpO2 98%.  LABORATORY DATA: Lab Results  Component Value Date   WBC 7.0  08/14/2023   HGB 9.6 (L) 08/14/2023   HCT 29.5 (L) 08/14/2023   MCV 118.5 (H) 08/14/2023   PLT 454 (H) 08/14/2023      Chemistry      Component Value Date/Time   NA 137 07/24/2023 0927   K 3.8 07/24/2023 0927   CL 106 07/24/2023 0927   CO2 26 07/24/2023 0927   BUN 13 07/24/2023 0927   CREATININE 0.94 07/24/2023 0927      Component Value Date/Time   CALCIUM 8.6 (L) 07/24/2023 0927   ALKPHOS 78 07/24/2023 0927   AST 27 07/24/2023 0927   ALT 14 07/24/2023 0927   BILITOT 0.2 07/24/2023 0927       RADIOGRAPHIC STUDIES: No results found.   ASSESSMENT AND PLAN: This is a very pleasant 58 years old white female with Stage IV (T2b, N2, M1 C) non-small cell lung cancer, adenocarcinoma presented with right hilar mass in addition to mediastinal and upper abdominal  lymphadenopathy in addition to bilateral adrenal metastases and metastatic disease in the musculature of the lower back.  This was diagnosed in April 2023. The molecular studies by [employer] showed no actionable mutations and the patient has negative PD-L1 expression. She is currently undergoing palliative radiotherapy to the right hilar region under the care of Dr. Mitzi Hansen. The patient is currently on systemic chemotherapy started with carboplatin for AUC of 5, Alimta 500 Mg/M2 and Keytruda 200 Mg IV every 3 weeks and starting from cycle #5 she is on maintenance treatment with Alimta and Keytruda every 3 weeks.  Status post a total of 33 cycles.  The patient has been tolerating her treatment fairly well.    Stage IV non-small cell lung cancer, adenocarcinoma Stage IV non-small cell lung cancer, adenocarcinoma, diagnosed in April 2023. No actionable mutations and negative PD-L1 expression. Completed 33 cycles of systemic chemotherapy with carboplatin, Alimta, and Keytruda. Present for evaluation before starting cycle 34. Plan to continue with one more round of combined chemotherapy and immunotherapy, potentially dropping immunotherapy after cycle 35, completing two years of treatment. Expressed interest in a treatment break, to be considered after the next scan if results are favorable. - Administer cycle 34 of Alimta and Keytruda - Perform scan after cycle 35 to evaluate treatment response - Consider treatment break if scan results are favorable  Dyspnea and wheezing Worsening dyspnea and wheezing, possibly exacerbated by pollen exposure. Using rescue inhaler (albuterol) without significant relief. Anemia may also contribute to shortness of breath and lack of stamina. - Continue using rescue inhaler (albuterol) - Advise wearing a mask outdoors to reduce pollen exposure - Follow up with pulmonologist on May 20  Anemia Chronic anemia contributing to shortness of breath and lack of stamina. Current  hemoglobin levels do not require transfusion. - Monitor hemoglobin levels   The patient was advised to call immediately if she has any concerning symptoms in the interval. The patient voices understanding of current disease status and treatment options and is in agreement with the current care plan. All questions were answered. The patient knows to call the clinic with any problems, questions or concerns. We can certainly see the patient much sooner if necessary.  The total time spent in the appointment was 20 minutes.  Disclaimer: This note was dictated with voice recognition software. Similar sounding words can inadvertently be transcribed and may not be corrected upon review.

## 2023-08-14 NOTE — Patient Instructions (Signed)
 [facility] - A DEPT OF [facility]  Discharge Instructions: Thank you for choosing [facility] to provide your oncology and hematology care.   If you have a lab appointment with the Cancer Center, please go directly to the Cancer Center and check in at the registration area.   Wear comfortable clothing and clothing appropriate for easy access to any Portacath or PICC line.   We strive to give you quality time with your provider. You may need to reschedule your appointment if you arrive late (15 or more minutes).  Arriving late affects you and other patients whose appointments are after yours.  Also, if you miss three or more appointments without notifying the office, you may be dismissed from the clinic at the provider's discretion.      For prescription refill requests, have your pharmacy contact our office and allow 72 hours for refills to be completed.    Today you received the following chemotherapy and/or immunotherapy agents: Keytruda/Alimta      To help prevent nausea and vomiting after your treatment, we encourage you to take your nausea medication as directed.  BELOW ARE SYMPTOMS THAT SHOULD BE REPORTED IMMEDIATELY: *FEVER GREATER THAN 100.4 F (38 C) OR HIGHER *CHILLS OR SWEATING *NAUSEA AND VOMITING THAT IS NOT CONTROLLED WITH YOUR NAUSEA MEDICATION *UNUSUAL SHORTNESS OF BREATH *UNUSUAL BRUISING OR BLEEDING *URINARY PROBLEMS (pain or burning when urinating, or frequent urination) *BOWEL PROBLEMS (unusual diarrhea, constipation, pain near the anus) TENDERNESS IN MOUTH AND THROAT WITH OR WITHOUT PRESENCE OF ULCERS (sore throat, sores in mouth, or a toothache) UNUSUAL RASH, SWELLING OR PAIN  UNUSUAL VAGINAL DISCHARGE OR ITCHING   Items with * indicate a potential emergency and should be followed up as soon as possible or go to the Emergency Department if any problems should occur.  Please show the CHEMOTHERAPY ALERT CARD or  IMMUNOTHERAPY ALERT CARD at check-in to the Emergency Department and triage nurse.  Should you have questions after your visit or need to cancel or reschedule your appointment, please contact [facility] - A DEPT [facility]  Dept: [phone]  and follow the prompts.  Office hours are 8:00 a.m. to 4:30 p.m. Monday - Friday. Please note that voicemails left after 4:00 p.m. may not be returned until the following business day.  We are closed weekends and major holidays. You have access to a nurse at all times for urgent questions. Please call the main number to the clinic Dept: [phone] and follow the prompts.   For any non-urgent questions, you may also contact your provider using MyChart. We now offer e-Visits for anyone 6 and older to request care online for non-urgent symptoms. For details visit [email or web address].   Also download the MyChart app! Go to the app store, search "MyChart", open the app, select [place], and log in with your MyChart username and password.

## 2023-09-04 ENCOUNTER — Encounter: Payer: Self-pay | Admitting: Gastroenterology

## 2023-09-04 ENCOUNTER — Inpatient Hospital Stay (HOSPITAL_BASED_OUTPATIENT_CLINIC_OR_DEPARTMENT_OTHER): Payer: BC Managed Care – PPO | Admitting: Internal Medicine

## 2023-09-04 ENCOUNTER — Inpatient Hospital Stay: Payer: BC Managed Care – PPO

## 2023-09-04 ENCOUNTER — Encounter: Payer: Self-pay | Admitting: Internal Medicine

## 2023-09-04 ENCOUNTER — Other Ambulatory Visit: Payer: Self-pay | Admitting: Physician Assistant

## 2023-09-04 ENCOUNTER — Inpatient Hospital Stay: Admitting: Dietician

## 2023-09-04 VITALS — BP 133/68 | HR 110 | Temp 98.3°F | Resp 17 | Ht 59.5 in | Wt 106.8 lb

## 2023-09-04 DIAGNOSIS — Z7901 Long term (current) use of anticoagulants: Secondary | ICD-10-CM | POA: Diagnosis not present

## 2023-09-04 DIAGNOSIS — Z5112 Encounter for antineoplastic immunotherapy: Secondary | ICD-10-CM | POA: Diagnosis not present

## 2023-09-04 DIAGNOSIS — C3491 Malignant neoplasm of unspecified part of right bronchus or lung: Secondary | ICD-10-CM

## 2023-09-04 DIAGNOSIS — Z923 Personal history of irradiation: Secondary | ICD-10-CM | POA: Diagnosis not present

## 2023-09-04 DIAGNOSIS — Z85820 Personal history of malignant melanoma of skin: Secondary | ICD-10-CM | POA: Diagnosis not present

## 2023-09-04 DIAGNOSIS — Z8541 Personal history of malignant neoplasm of cervix uteri: Secondary | ICD-10-CM | POA: Diagnosis not present

## 2023-09-04 DIAGNOSIS — C7972 Secondary malignant neoplasm of left adrenal gland: Secondary | ICD-10-CM | POA: Diagnosis not present

## 2023-09-04 DIAGNOSIS — Z9049 Acquired absence of other specified parts of digestive tract: Secondary | ICD-10-CM | POA: Diagnosis not present

## 2023-09-04 DIAGNOSIS — D6481 Anemia due to antineoplastic chemotherapy: Secondary | ICD-10-CM | POA: Diagnosis not present

## 2023-09-04 DIAGNOSIS — T451X5D Adverse effect of antineoplastic and immunosuppressive drugs, subsequent encounter: Secondary | ICD-10-CM | POA: Diagnosis not present

## 2023-09-04 DIAGNOSIS — Z95828 Presence of other vascular implants and grafts: Secondary | ICD-10-CM

## 2023-09-04 DIAGNOSIS — Z5111 Encounter for antineoplastic chemotherapy: Secondary | ICD-10-CM | POA: Diagnosis not present

## 2023-09-04 DIAGNOSIS — C7971 Secondary malignant neoplasm of right adrenal gland: Secondary | ICD-10-CM | POA: Diagnosis not present

## 2023-09-04 DIAGNOSIS — C3490 Malignant neoplasm of unspecified part of unspecified bronchus or lung: Secondary | ICD-10-CM | POA: Diagnosis not present

## 2023-09-04 DIAGNOSIS — Z9851 Tubal ligation status: Secondary | ICD-10-CM | POA: Diagnosis not present

## 2023-09-04 LAB — CMP (CANCER CENTER ONLY)
ALT: 14 U/L (ref 0–44)
AST: 26 U/L (ref 15–41)
Albumin: 2.8 g/dL — ABNORMAL LOW (ref 3.5–5.0)
Alkaline Phosphatase: 84 U/L (ref 38–126)
Anion gap: 4 — ABNORMAL LOW (ref 5–15)
BUN: 12 mg/dL (ref 6–20)
CO2: 28 mmol/L (ref 22–32)
Calcium: 8.6 mg/dL — ABNORMAL LOW (ref 8.9–10.3)
Chloride: 104 mmol/L (ref 98–111)
Creatinine: 0.69 mg/dL (ref 0.44–1.00)
GFR, Estimated: >60 mL/min (ref >60)
Glucose, Bld: 100 mg/dL — ABNORMAL HIGH (ref 70–99)
Potassium: 3.9 mmol/L (ref 3.5–5.1)
Sodium: 136 mmol/L (ref 135–145)
Total Bilirubin: 0.2 mg/dL (ref 0.0–1.2)
Total Protein: 6.3 g/dL — ABNORMAL LOW (ref 6.5–8.1)

## 2023-09-04 LAB — CBC WITH DIFFERENTIAL (CANCER CENTER ONLY)
Abs Immature Granulocytes: 0.14 10*3/uL — ABNORMAL HIGH (ref 0.00–0.07)
Basophils Absolute: 0 10*3/uL (ref 0.0–0.1)
Basophils Relative: 1 %
Eosinophils Absolute: 0.2 10*3/uL (ref 0.0–0.5)
Eosinophils Relative: 2 %
HCT: 26.9 % — ABNORMAL LOW (ref 36.0–46.0)
Hemoglobin: 8.8 g/dL — ABNORMAL LOW (ref 12.0–15.0)
Immature Granulocytes: 2 %
Lymphocytes Relative: 18 %
Lymphs Abs: 1.2 10*3/uL (ref 0.7–4.0)
MCH: 38.1 pg — ABNORMAL HIGH (ref 26.0–34.0)
MCHC: 32.7 g/dL (ref 30.0–36.0)
MCV: 116.5 fL — ABNORMAL HIGH (ref 80.0–100.0)
Monocytes Absolute: 1.2 10*3/uL — ABNORMAL HIGH (ref 0.1–1.0)
Monocytes Relative: 17 %
Neutro Abs: 4.3 10*3/uL (ref 1.7–7.7)
Neutrophils Relative %: 60 %
Platelet Count: 333 10*3/uL (ref 150–400)
RBC: 2.31 MIL/uL — ABNORMAL LOW (ref 3.87–5.11)
RDW: 16.5 % — ABNORMAL HIGH (ref 11.5–15.5)
WBC Count: 7 10*3/uL (ref 4.0–10.5)
nRBC: 0 % (ref 0.0–0.2)

## 2023-09-04 LAB — SAMPLE TO BLOOD BANK

## 2023-09-04 MED ORDER — PROCHLORPERAZINE MALEATE 10 MG PO TABS
10.0000 mg | ORAL_TABLET | Freq: Once | ORAL | Status: AC
  Administered 2023-09-04: 10 mg via ORAL
  Filled 2023-09-04: qty 1

## 2023-09-04 MED ORDER — SODIUM CHLORIDE 0.9 % IV SOLN
200.0000 mg | Freq: Once | INTRAVENOUS | Status: AC
  Administered 2023-09-04: 200 mg via INTRAVENOUS
  Filled 2023-09-04: qty 200

## 2023-09-04 MED ORDER — SODIUM CHLORIDE 0.9% FLUSH
10.0000 mL | Freq: Once | INTRAVENOUS | Status: AC
  Administered 2023-09-04: 10 mL

## 2023-09-04 MED ORDER — SODIUM CHLORIDE 0.9 % IV SOLN: Freq: Once | INTRAVENOUS | Status: AC

## 2023-09-04 MED ORDER — SODIUM CHLORIDE 0.9 % IV SOLN
400.0000 mg/m2 | Freq: Once | INTRAVENOUS | Status: AC
  Administered 2023-09-04: 600 mg via INTRAVENOUS
  Filled 2023-09-04: qty 20

## 2023-09-04 NOTE — Progress Notes (Signed)
 [facility] [phone]   Fax:([phone]  OFFICE PROGRESS NOTE  [name], FNP [address] [place] [address]  DIAGNOSIS: Stage IV (T2b, N2, M1 C) non-small cell lung cancer, adenocarcinoma presented with right hilar mass in addition to mediastinal and upper abdominal lymphadenopathy in addition to bilateral adrenal metastases and metastatic disease in the musculature of the lower back.  This was diagnosed in April 2023.  DETECTED ALTERATION(S) / BIOMARKER(S) % CFDNA OR AMPLIFICATION ASSOCIATED FDA-APPROVED THERAPIES CLINICAL TRIAL AVAILABILITY [id number] 0.5% None  Yes [id number] (Splice Site Indel) 0.6% None  Yes  PD-L1 expression 0%  PRIOR THERAPY: Palliative radiotherapy to the right hilar region under the care of Dr. Jeryl Moris.  CURRENT THERAPY:  systemic chemotherapy with carboplatin  for AUC of 5, Alimta 500 Mg/M2 and Keytruda  200 Mg IV every 3 weeks.  First dose August 29, 2021.  Status post 34  cycles.  Starting from cycle #5 the patient is on maintenance treatment with Alimta and Keytruda  every 3 weeks.  INTERVAL HISTORY: April Walsh 58 y.o. female returns to the clinic today for follow-up visit.Discussed the use of AI scribe software for clinical note transcription with the patient, who gave verbal consent to proceed.  History of Present Illness   April Walsh is a 58 year old female undergoing chemotherapy who presents for evaluation before starting cycle number thirty-five of her treatment.  She experiences dyspnea, describing it as feeling like she has 'run a marathon.' This issue began shortly after starting chemotherapy and persists regardless of her activities. She also experiences wheezing, which is exacerbated by pollen exposure. Due to this, she limits outdoor activities except for medical visits.  Her hemoglobin level is 8.8 g/dL, contributing to symptoms of anemia. She is not currently eligible for  a transfusion as her hemoglobin is above the threshold for intervention.        MEDICAL HISTORY: Past Medical History:  Diagnosis Date   Anemia    Asthma    Basal cell carcinoma    Bronchitis    Cervical cancer (HCC)    COPD (chronic obstructive pulmonary disease) (HCC)    "early stages of COPD"   Headache    HLD (hyperlipidemia)    Lung cancer (HCC) 08/13/2021    ALLERGIES:  has no known allergies.  MEDICATIONS:  Current Outpatient Medications  Medication Sig Dispense Refill   albuterol  (ACCUNEB ) 0.63 MG/3ML nebulizer solution Take 3 mLs (0.63 mg total) by nebulization every 4 (four) hours as needed for wheezing or shortness of breath. 75 mL 11   albuterol  (VENTOLIN  HFA) 108 (90 Base) MCG/ACT inhaler Inhale 2 puffs into the lungs every 6 (six) hours as needed for wheezing or shortness of breath. 8 g 8   apixaban  (ELIQUIS ) 2.5 MG TABS tablet Take 1 tablet (2.5 mg total) by mouth 2 (two) times daily. 60 tablet 0   atorvastatin  (LIPITOR) 80 MG tablet TAKE 1 TABLET BY MOUTH EVERY DAY 30 tablet 3   Budeson-Glycopyrrol-Formoterol  (BREZTRI  AEROSPHERE) 160-9-4.8 MCG/ACT AERO Inhale 2 puffs into the lungs in the morning and at bedtime. 10.7 each 10   folic acid  (FOLVITE ) 1 MG tablet TAKE 1 TABLET BY MOUTH EVERY DAY 30 tablet 2   lidocaine -prilocaine  (EMLA ) cream APPLY TOPICALLY 1 APPLICATION AS NEEDED 30 g 2   mirtazapine  (REMERON ) 7.5 MG tablet Take 1 tablet (7.5 mg total) by mouth at bedtime. 30 tablet 2   prochlorperazine  (COMPAZINE ) 10 MG tablet Take 1 tablet (10  mg total) by mouth every 6 (six) hours as needed. 30 tablet 2   No current facility-administered medications for this visit.   Facility-Administered Medications Ordered in Other Visits  Medication Dose Route Frequency Provider Last Rate Last Admin   cyanocobalamin  (VITAMIN B12) 1000 MCG/ML injection            prochlorperazine  (COMPAZINE ) 10 MG tablet             SURGICAL HISTORY:  Past Surgical History:  Procedure  Laterality Date   APPENDECTOMY     1996   BREAST BIOPSY Right    2010-2015   BRONCHIAL BIOPSY  08/13/2021   Procedure: BRONCHIAL BIOPSIES;  Surgeon: Denson Flake, MD;  Location: [facility] ENDOSCOPY;  Service: Pulmonary;;   BRONCHIAL BRUSHINGS  08/13/2021   Procedure: BRONCHIAL BRUSHINGS;  Surgeon: Denson Flake, MD;  Location: [facility] ENDOSCOPY;  Service: Pulmonary;;   BRONCHIAL NEEDLE ASPIRATION BIOPSY  08/13/2021   Procedure: BRONCHIAL NEEDLE ASPIRATION BIOPSIES;  Surgeon: Denson Flake, MD;  Location: [facility] ENDOSCOPY;  Service: Pulmonary;;   CESAREAN SECTION     1984, 1987   FOOT SURGERY Right    2 pins and screw 1999   FOOT SURGERY Left 06/21/2021   screw and 2 pins   HEMOSTASIS CONTROL  08/13/2021   Procedure: HEMOSTASIS CONTROL;  Surgeon: Denson Flake, MD;  Location: [facility] ENDOSCOPY;  Service: Pulmonary;;   IR IMAGING GUIDED PORT INSERTION  11/26/2021   MELANOMA EXCISION Left    basal cell melanoma 2011   PARTIAL HYSTERECTOMY     1994   TUBAL LIGATION     1987   VIDEO BRONCHOSCOPY  08/13/2021   Procedure: VIDEO BRONCHOSCOPY WITHOUT FLUORO;  Surgeon: Denson Flake, MD;  Location: [facility] ENDOSCOPY;  Service: Pulmonary;;   VIDEO BRONCHOSCOPY WITH ENDOBRONCHIAL ULTRASOUND N/A 08/13/2021   Procedure: VIDEO BRONCHOSCOPY WITH ENDOBRONCHIAL ULTRASOUND;  Surgeon: Denson Flake, MD;  Location: [facility] ENDOSCOPY;  Service: Pulmonary;  Laterality: N/A;    REVIEW OF SYSTEMS:  A comprehensive review of systems was negative except for: Constitutional: positive for fatigue Respiratory: positive for dyspnea on exertion and wheezing   PHYSICAL EXAMINATION: General appearance: alert, cooperative, fatigued, and no distress Head: Normocephalic, without obvious abnormality, atraumatic Neck: no adenopathy, no JVD, supple, symmetrical, trachea midline, and thyroid  not enlarged, symmetric, no tenderness/mass/nodules Lymph nodes: Cervical, supraclavicular, and axillary nodes normal. Resp: wheezes bilaterally Back:  symmetric, no curvature. ROM normal. No CVA tenderness. Cardio: regular rate and rhythm, S1, S2 normal, no murmur, click, rub or gallop GI: soft, non-tender; bowel sounds normal; no masses,  no organomegaly Extremities: extremities normal, atraumatic, no cyanosis or edema  ECOG PERFORMANCE STATUS: 1 - Symptomatic but completely ambulatory  Blood pressure 133/68, pulse (!) 110, temperature 98.3 F (36.8 C), temperature source Temporal, resp. rate 17, height 4' 11.5" (1.511 m), weight 106 lb 12.8 oz (48.4 kg), SpO2 99%.  LABORATORY DATA: Lab Results  Component Value Date   WBC 7.0 09/04/2023   HGB 8.8 (L) 09/04/2023   HCT 26.9 (L) 09/04/2023   MCV 116.5 (H) 09/04/2023   PLT 333 09/04/2023      Chemistry      Component Value Date/Time   NA 136 08/14/2023 0854   K 4.5 08/14/2023 0854   CL 105 08/14/2023 0854   CO2 27 08/14/2023 0854   BUN 15 08/14/2023 0854   CREATININE 0.86 08/14/2023 0854      Component Value Date/Time   CALCIUM  8.6 (L) 08/14/2023 0854   ALKPHOS  86 08/14/2023 0854   AST 33 08/14/2023 0854   ALT 19 08/14/2023 0854   BILITOT 0.2 08/14/2023 0854       RADIOGRAPHIC STUDIES: No results found.   ASSESSMENT AND PLAN: This is a very pleasant 58 years old white female with Stage IV (T2b, N2, M1 C) non-small cell lung cancer, adenocarcinoma presented with right hilar mass in addition to mediastinal and upper abdominal lymphadenopathy in addition to bilateral adrenal metastases and metastatic disease in the musculature of the lower back.  This was diagnosed in April 2023. The molecular studies by [employer] showed no actionable mutations and the patient has negative PD-L1 expression. She is currently undergoing palliative radiotherapy to the right hilar region under the care of Dr. Jeryl Moris. The patient is currently on systemic chemotherapy started with carboplatin  for AUC of 5, Alimta 500 Mg/M2 and Keytruda  200 Mg IV every 3 weeks and starting from cycle #5 she is on  maintenance treatment with Alimta and Keytruda  every 3 weeks.  Status post a total of 34 cycles.  The patient has been tolerating her treatment fairly well. Assessment and Plan    Stage IV (T2b, N2, M1 C) non-small cell lung cancer, adenocarcinoma presented with right hilar mass in addition to mediastinal and upper abdominal lymphadenopathy in addition to bilateral adrenal metastases and metastatic disease in the musculature of the lower back.  This was diagnosed in April 2023. She is currently on systemic chemotherapy with carboplatin  for AUC of 5, Alimta 500 Mg/M2 and Keytruda  200 Mg IV every 3 weeks.  First dose August 29, 2021.  Status post 34  cycles.  Starting from cycle #5 the patient is on maintenance treatment with Alimta and Keytruda  every 3 weeks.  Undergoing 35th cycle of chemotherapy. A scan will be performed after this cycle to assess treatment effectiveness. If scan results are favorable, a break from treatment may be considered, contingent on the scan showing no concerning findings. - Perform a scan 10 days before the next visit to evaluate treatment response - Consider a break from chemotherapy if scan results are favorable  Anemia Experiencing anemia likely secondary to chemotherapy, with a hemoglobin level of 8.8 g/dL, contributing to dyspnea and fatigue. Hemoglobin level is not low enough to warrant a transfusion at this time. - Monitor hemoglobin levels - Reassess the need for transfusion if hemoglobin levels drop further  Dyspnea Reports dyspnea, feeling as though she has run a marathon, likely related to anemia. Wheezing noted on examination, with a possibility of seasonal allergies exacerbating symptoms due to pollen exposure. - Advise wearing a mask when going outside to reduce pollen exposure   The patient was advised to call immediately if she has any concerning symptoms in the interval. The patient voices understanding of current disease status and treatment options and  is in agreement with the current care plan. All questions were answered. The patient knows to call the clinic with any problems, questions or concerns. We can certainly see the patient much sooner if necessary.  The total time spent in the appointment was 20 minutes.  Disclaimer: This note was dictated with voice recognition software. Similar sounding words can inadvertently be transcribed and may not be corrected upon review.

## 2023-09-04 NOTE — Patient Instructions (Signed)
 [facility] - A DEPT OF [facility]  Discharge Instructions: Thank you for choosing [facility] to provide your oncology and hematology care.   If you have a lab appointment with the Cancer Center, please go directly to the Cancer Center and check in at the registration area.   Wear comfortable clothing and clothing appropriate for easy access to any Portacath or PICC line.   We strive to give you quality time with your provider. You may need to reschedule your appointment if you arrive late (15 or more minutes).  Arriving late affects you and other patients whose appointments are after yours.  Also, if you miss three or more appointments without notifying the office, you may be dismissed from the clinic at the provider's discretion.      For prescription refill requests, have your pharmacy contact our office and allow 72 hours for refills to be completed.    Today you received the following chemotherapy and/or immunotherapy agents keytruda, alimta      To help prevent nausea and vomiting after your treatment, we encourage you to take your nausea medication as directed.  BELOW ARE SYMPTOMS THAT SHOULD BE REPORTED IMMEDIATELY: *FEVER GREATER THAN 100.4 F (38 C) OR HIGHER *CHILLS OR SWEATING *NAUSEA AND VOMITING THAT IS NOT CONTROLLED WITH YOUR NAUSEA MEDICATION *UNUSUAL SHORTNESS OF BREATH *UNUSUAL BRUISING OR BLEEDING *URINARY PROBLEMS (pain or burning when urinating, or frequent urination) *BOWEL PROBLEMS (unusual diarrhea, constipation, pain near the anus) TENDERNESS IN MOUTH AND THROAT WITH OR WITHOUT PRESENCE OF ULCERS (sore throat, sores in mouth, or a toothache) UNUSUAL RASH, SWELLING OR PAIN  UNUSUAL VAGINAL DISCHARGE OR ITCHING   Items with * indicate a potential emergency and should be followed up as soon as possible or go to the Emergency Department if any problems should occur.  Please show the CHEMOTHERAPY ALERT CARD or  IMMUNOTHERAPY ALERT CARD at check-in to the Emergency Department and triage nurse.  Should you have questions after your visit or need to cancel or reschedule your appointment, please contact [facility] - [facility]  Dept: [phone]  and follow the prompts.  Office hours are 8:00 a.m. to 4:30 p.m. Monday - Friday. Please note that voicemails left after 4:00 p.m. may not be returned until the following business day.  We are closed weekends and major holidays. You have access to a nurse at all times for urgent questions. Please call the main number to the clinic Dept: [phone] and follow the prompts.   For any non-urgent questions, you may also contact your provider using MyChart. We now offer e-Visits for anyone 51 and older to request care online for non-urgent symptoms. For details visit [email or web address].   Also download the MyChart app! Go to the app store, search "MyChart", open the app, select [place], and log in with your MyChart username and password.

## 2023-09-04 NOTE — Progress Notes (Signed)
Per Dr. Julien Nordmann, ok to treat with elevated heart rate.

## 2023-09-04 NOTE — Progress Notes (Signed)
 Nutrition Follow-up:  Patient with stage IV lung cancer. She is currently receiving maintenance therapy with keytruda  q21d.   Met with patient in infusion. She reports increasing shortness of breath. Patient is eating 2 small meals. She reports overnight nausea managed with antiemetics. Patient usually eats a yogurt or fruit cup in the morning as this is best tolerated. Patient recalls grilled chicken salad for dinner last night. She really likes vegetables. Patient eats little meat. It does not appeal to her. Patient has not been drinking CIB.    Medications: reviewed   Labs: albumin 2.8  Anthropometrics: Wt 106 lb 12.8 oz today   4/3 - 108 lb 12.8 oz 3/17 - 104 lb 8 oz   NUTRITION DIAGNOSIS: Unintended wt loss ongoing   INTERVENTION:  Encourage small frequent meals/snacks q2-3h vs 2 meals/day - snack ideas provided Recommend soft moist foods for ease of intake - handout provided Educated on sources of protein, recommend protein foods at every meal Recommend 1-2 CIB mixed with fairlife whole milk Discussed food sources of iron - handout provided     MONITORING, EVALUATION, GOAL: wt trends, intake   NEXT VISIT: Thursday June 5 during infusion

## 2023-09-11 ENCOUNTER — Ambulatory Visit: Admitting: Gastroenterology

## 2023-09-11 ENCOUNTER — Encounter: Payer: Self-pay | Admitting: Gastroenterology

## 2023-09-11 ENCOUNTER — Other Ambulatory Visit: Payer: Self-pay | Admitting: Gastroenterology

## 2023-09-11 VITALS — BP 98/61 | HR 107 | Temp 97.9°F | Resp 24 | Ht 59.0 in | Wt 104.0 lb

## 2023-09-11 DIAGNOSIS — D649 Anemia, unspecified: Secondary | ICD-10-CM | POA: Diagnosis not present

## 2023-09-11 DIAGNOSIS — K209 Esophagitis, unspecified without bleeding: Secondary | ICD-10-CM | POA: Diagnosis not present

## 2023-09-11 DIAGNOSIS — K5730 Diverticulosis of large intestine without perforation or abscess without bleeding: Secondary | ICD-10-CM | POA: Diagnosis not present

## 2023-09-11 DIAGNOSIS — K319 Disease of stomach and duodenum, unspecified: Secondary | ICD-10-CM | POA: Diagnosis not present

## 2023-09-11 DIAGNOSIS — R110 Nausea: Secondary | ICD-10-CM

## 2023-09-11 DIAGNOSIS — R634 Abnormal weight loss: Secondary | ICD-10-CM

## 2023-09-11 DIAGNOSIS — K259 Gastric ulcer, unspecified as acute or chronic, without hemorrhage or perforation: Secondary | ICD-10-CM

## 2023-09-11 MED ORDER — SODIUM CHLORIDE 0.9 % IV SOLN: 500.0000 mL | Freq: Once | INTRAVENOUS | Status: DC

## 2023-09-11 MED ORDER — PANTOPRAZOLE SODIUM 40 MG PO TBEC: 40.0000 mg | DELAYED_RELEASE_TABLET | Freq: Two times a day (BID) | ORAL | 3 refills | Status: AC

## 2023-09-11 MED ORDER — SUCRALFATE 1 GM/10ML PO SUSP: 1.0000 g | Freq: Four times a day (QID) | ORAL | 1 refills | Status: DC

## 2023-09-11 NOTE — Progress Notes (Signed)
 Sedate, gd SR, tolerated procedure well, VSS, report to RN

## 2023-09-11 NOTE — Op Note (Addendum)
 [facility] Patient Name: Trinitie Jepsen Procedure Date: 09/11/2023 9:16 AM MRN: [id number] Endoscopist: Harry Lindau , MD, [id number] Age: 58 Referring MD:  Date of Birth: [birth year] Gender: Female Account #: [id number] Procedure:                Upper GI endoscopy Indications:              Anemia Medicines:                Monitored Anesthesia Care Procedure:                Pre-Anesthesia Assessment:                           - Prior to the procedure, a History and Physical                            was performed, and patient medications and                            allergies were reviewed. The patient's tolerance of                            previous anesthesia was also reviewed. The risks                            and benefits of the procedure and the sedation                            options and risks were discussed with the patient.                            All questions were answered, and informed consent                            was obtained. Prior Anticoagulants: The patient has                            taken Eliquis  (apixaban ), last dose was 2 days                            prior to procedure. ASA Grade Assessment: III - A                            patient with severe systemic disease. After                            reviewing the risks and benefits, the patient was                            deemed in satisfactory condition to undergo the                            procedure.  After obtaining informed consent, the endoscope was                            passed under direct vision. Throughout the                            procedure, the patient's blood pressure, pulse, and                            oxygen saturations were monitored continuously. The                            GIF [id number] #[id number] was introduced through the                            mouth, and advanced to the second part of duodenum.                             The upper GI endoscopy was accomplished without                            difficulty. The patient tolerated the procedure                            well. Scope In: Scope Out: Findings:                 LA Grade B (one or more mucosal breaks greater than                            5 mm, not extending between the tops of two mucosal                            folds) esophagitis with no bleeding was found in                            the lower third of the esophagus.                           The upper third of the esophagus and middle third                            of the esophagus were normal.                           Multiple non-bleeding superficial gastric ulcers                            and 1 cratered ulcer with no stigmata of bleeding                            were found at the incisura, in the gastric antrum  and in the prepyloric region of the stomach. The                            largest lesion was 4 mm in largest dimension.                            Biopsies were taken with a cold forceps for                            histology. Estimated blood loss was minimal.                           The gastric fundus and gastric body were normal.                           The examined duodenum was normal. Complications:            No immediate complications. Estimated Blood Loss:     Estimated blood loss was minimal. Impression:               - LA Grade B esophagitis with no bleeding.                           - Normal upper third of esophagus and middle third                            of esophagus.                           - Non-bleeding gastric ulcers with no stigmata of                            bleeding. Biopsied.                           - Normal gastric fundus and gastric body.                           - Normal examined duodenum.                           - There was no active bleeding or high-grade                            stigmata of  bleeding to warrant further endoscopic                            intervention today. Recommendation:           - Patient has a contact number available for                            emergencies. The signs and symptoms of potential                            delayed complications were discussed with  the                            patient. Return to normal activities tomorrow.                            Written discharge instructions were provided to the                            patient.                           - Resume previous diet.                           - Await pathology results.                           - Use Protonix  (pantoprazole ) 40 mg PO BID for 6                            weeks, then resume 40 mg daily indefinitely.                           - Use sucralfate  suspension 1 gram PO QID for 4                            weeks.                           - Colonoscopy today.                           - Resume Eliquis  tomorrow.                           - Continue follow-up with Dr. Liam Redhead in the                            Oncology Clinic with serial CBC checks and blood                            products as needed per protocol. Harry Lindau, MD 09/11/2023 9:52:07 AM

## 2023-09-11 NOTE — Progress Notes (Signed)
 GASTROENTEROLOGY PROCEDURE H&P NOTE   Primary Care Physician: [name], FNP    Reason for Procedure:   Anemia, nausea, weight loss  Plan:    EGD, colonoscopy  Patient is appropriate for endoscopic procedure(s) in the ambulatory ([facility]) setting.  The nature of the procedure, as well as the risks, benefits, and alternatives were carefully and thoroughly reviewed with the patient. Ample time for discussion and questions allowed. The patient understood, was satisfied, and agreed to proceed.     HPI: April Walsh is a 58 y.o. female referred by Dr. Liam Redhead in the Oncology Clinic for evaluation and potential treatment of anemia.  History of stage IV non-small cell lung cancer, currently undergoing radiotherapy and systemic chemotherapy, and referred to evaluate for potential GI source of anemia requiring recent blood transfusion to potentially treat endoscopically.  She is otherwise without overt bleeding.  No previous EGD/colonoscopy.  Eliquis  on hold for procedures today.   Past Medical History:  Diagnosis Date   Anemia    Asthma    Basal cell carcinoma    Bronchitis    Cervical cancer (HCC)    COPD (chronic obstructive pulmonary disease) (HCC)    "early stages of COPD"   Headache    HLD (hyperlipidemia)    Lung cancer (HCC) 08/13/2021    Past Surgical History:  Procedure Laterality Date   APPENDECTOMY     1996   BREAST BIOPSY Right    2010-2015   BRONCHIAL BIOPSY  08/13/2021   Procedure: BRONCHIAL BIOPSIES;  Surgeon: Denson Flake, MD;  Location: [facility] ENDOSCOPY;  Service: Pulmonary;;   BRONCHIAL BRUSHINGS  08/13/2021   Procedure: BRONCHIAL BRUSHINGS;  Surgeon: Denson Flake, MD;  Location: [facility] ENDOSCOPY;  Service: Pulmonary;;   BRONCHIAL NEEDLE ASPIRATION BIOPSY  08/13/2021   Procedure: BRONCHIAL NEEDLE ASPIRATION BIOPSIES;  Surgeon: Denson Flake, MD;  Location: [facility] ENDOSCOPY;  Service: Pulmonary;;   CESAREAN SECTION     1984, 1987   FOOT SURGERY Right     2 pins and screw 1999   FOOT SURGERY Left 06/21/2021   screw and 2 pins   HEMOSTASIS CONTROL  08/13/2021   Procedure: HEMOSTASIS CONTROL;  Surgeon: Denson Flake, MD;  Location: [facility] ENDOSCOPY;  Service: Pulmonary;;   IR IMAGING GUIDED PORT INSERTION  11/26/2021   MELANOMA EXCISION Left    basal cell melanoma 2011   PARTIAL HYSTERECTOMY     1994   TUBAL LIGATION     1987   VIDEO BRONCHOSCOPY  08/13/2021   Procedure: VIDEO BRONCHOSCOPY WITHOUT FLUORO;  Surgeon: Denson Flake, MD;  Location: [facility] ENDOSCOPY;  Service: Pulmonary;;   VIDEO BRONCHOSCOPY WITH ENDOBRONCHIAL ULTRASOUND N/A 08/13/2021   Procedure: VIDEO BRONCHOSCOPY WITH ENDOBRONCHIAL ULTRASOUND;  Surgeon: Denson Flake, MD;  Location: [facility] ENDOSCOPY;  Service: Pulmonary;  Laterality: N/A;    Prior to Admission medications   Medication Sig Start Date End Date Taking? Authorizing Provider  albuterol  (ACCUNEB ) 0.63 MG/3ML nebulizer solution Take 3 mLs (0.63 mg total) by nebulization every 4 (four) hours as needed for wheezing or shortness of breath. 06/25/23  Yes Denson Flake, MD  albuterol  (VENTOLIN  HFA) 108 (90 Base) MCG/ACT inhaler Inhale 2 puffs into the lungs every 6 (six) hours as needed for wheezing or shortness of breath. 07/18/23  Yes Denson Flake, MD  Budeson-Glycopyrrol-Formoterol  (BREZTRI  AEROSPHERE) 160-9-4.8 MCG/ACT AERO Inhale 2 puffs into the lungs in the morning and at bedtime. 07/17/23  Yes Denson Flake, MD  folic acid  (FOLVITE )  1 MG tablet TAKE 1 TABLET BY MOUTH EVERY DAY 07/10/23  Yes Heilingoetter, Cassandra L, PA-C  lidocaine -prilocaine  (EMLA ) cream APPLY TOPICALLY 1 APPLICATION AS NEEDED 07/10/23  Yes Heilingoetter, Cassandra L, PA-C  Na Sulfate-K Sulfate-Mg Sulfate concentrate (SUPREP) 17.5-3.13-1.6 GM/177ML SOLN TAKE 1 KIT (354 MLS TOTAL) BY MOUTH ONCE FOR 1 DOSE. 07/28/23  Yes [provider]  prochlorperazine  (COMPAZINE ) 10 MG tablet Take 1 tablet (10 mg total) by mouth every 6 (six) hours as needed.  01/08/23  Yes Heilingoetter, Cassandra L, PA-C  apixaban  (ELIQUIS ) 2.5 MG TABS tablet Take 1 tablet (2.5 mg total) by mouth 2 (two) times daily. 09/12/21   Wynetta Heckle, MD  atorvastatin  (LIPITOR) 80 MG tablet TAKE 1 TABLET BY MOUTH EVERY DAY Patient not taking: Reported on 09/11/2023 08/27/22   Lisabeth Rider, MD    Current Outpatient Medications  Medication Sig Dispense Refill   albuterol  (ACCUNEB ) 0.63 MG/3ML nebulizer solution Take 3 mLs (0.63 mg total) by nebulization every 4 (four) hours as needed for wheezing or shortness of breath. 75 mL 11   albuterol  (VENTOLIN  HFA) 108 (90 Base) MCG/ACT inhaler Inhale 2 puffs into the lungs every 6 (six) hours as needed for wheezing or shortness of breath. 8 g 8   Budeson-Glycopyrrol-Formoterol  (BREZTRI  AEROSPHERE) 160-9-4.8 MCG/ACT AERO Inhale 2 puffs into the lungs in the morning and at bedtime. 10.7 each 10   folic acid  (FOLVITE ) 1 MG tablet TAKE 1 TABLET BY MOUTH EVERY DAY 30 tablet 2   lidocaine -prilocaine  (EMLA ) cream APPLY TOPICALLY 1 APPLICATION AS NEEDED 30 g 2   Na Sulfate-K Sulfate-Mg Sulfate concentrate (SUPREP) 17.5-3.13-1.6 GM/177ML SOLN TAKE 1 KIT (354 MLS TOTAL) BY MOUTH ONCE FOR 1 DOSE.     prochlorperazine  (COMPAZINE ) 10 MG tablet Take 1 tablet (10 mg total) by mouth every 6 (six) hours as needed. 30 tablet 2   apixaban  (ELIQUIS ) 2.5 MG TABS tablet Take 1 tablet (2.5 mg total) by mouth 2 (two) times daily. 60 tablet 0   atorvastatin  (LIPITOR) 80 MG tablet TAKE 1 TABLET BY MOUTH EVERY DAY (Patient not taking: Reported on 09/11/2023) 30 tablet 3   Current Facility-Administered Medications  Medication Dose Route Frequency Provider Last Rate Last Admin   0.9 %  sodium chloride  infusion  500 mL Intravenous Once Margues Filippini V, DO       Facility-Administered Medications Ordered in Other Visits  Medication Dose Route Frequency Provider Last Rate Last Admin   cyanocobalamin  (VITAMIN B12) 1000 MCG/ML injection            prochlorperazine   (COMPAZINE ) 10 MG tablet             Allergies as of 09/11/2023 - Review Complete 09/11/2023  Allergen Reaction Noted   Remeron  [mirtazapine ] Other (See Comments) 09/11/2023    Family History  Problem Relation Age of Onset   Diabetes Mother    Heart failure Mother    Hypertension Mother    Hyperlipidemia Mother    Brain cancer Maternal Aunt    Throat cancer Maternal Aunt    Lung cancer Paternal Uncle        he smoked   Lung cancer Paternal Uncle        he smoked   Ovarian cancer Maternal Grandmother    Seizures Daughter    Bronchitis Son    Lung cancer Cousin    Colon cancer Neg Hx    Colon polyps Neg Hx    Esophageal cancer Neg Hx    Rectal cancer  Neg Hx    Stomach cancer Neg Hx     Social History   Socioeconomic History   Marital status: Married    Spouse name: Not on file   Number of children: 2   Years of education: Not on file   Highest education level: Not on file  Occupational History   Occupation: retired  Tobacco Use   Smoking status: Former    Current packs/day: 0.00    Average packs/day: 0.5 packs/day for 32.0 years (16.0 ttl pk-yrs)    Types: Cigarettes    Quit date: 08/11/2021    Years since quitting: 2.0    Passive exposure: Past   Smokeless tobacco: Never   Tobacco comments:    1/2 pack smoked daily ARJ, RN 08/02/21  Vaping Use   Vaping status: Never Used  Substance and Sexual Activity   Alcohol use: Never   Drug use: Never   Sexual activity: Yes    Birth control/protection: Surgical, Post-menopausal  Other Topics Concern   Not on file  Social History Narrative   Not on file   Social Drivers of Health   Financial Resource Strain: Not on file  Food Insecurity: Not on file  Transportation Needs: Not on file  Physical Activity: Not on file  Stress: Not on file  Social Connections: Not on file  Intimate Partner Violence: Not on file    Physical Exam: Vital signs in last 24 hours: @BP  114/82   Pulse (!) 120   Temp 97.9 F (36.6 C)  (Temporal)   Ht 4\' 11"  (1.499 m)   Wt 104 lb (47.2 kg)   SpO2 98%   BMI 21.01 kg/m  GEN: NAD EYE: Sclerae anicteric ENT: MMM CV: Non-tachycardic Pulm: CTA b/l GI: Soft, NT/ND NEURO:  Alert & Oriented x 3   Harry Lindau, DO [facility]   09/11/2023 9:06 AM

## 2023-09-11 NOTE — Op Note (Signed)
 [facility] Patient Name: April Walsh Procedure Date: 09/11/2023 9:10 AM MRN: [id number] Endoscopist: Harry Lindau , MD, [id number] Age: 58 Referring MD:  Date of Birth: 09/22/1965 Gender: Female Account #: [id number] Procedure:                Colonoscopy Indications:              Anemia Medicines:                Monitored Anesthesia Care Procedure:                Pre-Anesthesia Assessment:                           - Prior to the procedure, a History and Physical                            was performed, and patient medications and                            allergies were reviewed. The patient's tolerance of                            previous anesthesia was also reviewed. The risks                            and benefits of the procedure and the sedation                            options and risks were discussed with the patient.                            All questions were answered, and informed consent                            was obtained. Prior Anticoagulants: The patient has                            taken Eliquis  (apixaban ), last dose was 2 days                            prior to procedure. ASA Grade Assessment: III - A                            patient with severe systemic disease. After                            reviewing the risks and benefits, the patient was                            deemed in satisfactory condition to undergo the                            procedure.  After obtaining informed consent, the colonoscope                            was passed under direct vision. Throughout the                            procedure, the patient's blood pressure, pulse, and                            oxygen saturations were monitored continuously. The                            Olympus Scope SN: [id number] was introduced through                            the anus and advanced to the the terminal ileum.                             The colonoscopy was performed without difficulty.                            The patient tolerated the procedure well. The                            quality of the bowel preparation was good. The                            terminal ileum, ileocecal valve, appendiceal                            orifice, and rectum were photographed. Scope In: 9:30:29 AM Scope Out: 9:38:21 AM Scope Withdrawal Time: 0 hours 6 minutes 7 seconds  Total Procedure Duration: 0 hours 7 minutes 52 seconds  Findings:                 The perianal and digital rectal examinations were                            normal.                           A few small-mouthed diverticula were found in the                            sigmoid colon.                           The exam was otherwise normal throughout the                            remainder of the colon.                           Retroflexion in the rectum was not performed due to  anatomy (narrow, short rectal vault). Anterograde                            views were otherwise normal.                           The terminal ileum appeared normal. Complications:            No immediate complications. Estimated Blood Loss:     Estimated blood loss: none. Impression:               - Mild diverticulosis in the sigmoid colon.                           - The examined portion of the ileum was normal.                           - The entire colon is otherwise normal appearing.                           - No specimens collected. Recommendation:           - Patient has a contact number available for                            emergencies. The signs and symptoms of potential                            delayed complications were discussed with the                            patient. Return to normal activities tomorrow.                            Written discharge instructions were provided to the                            patient.                            - Advance diet as tolerated today.                           - Resume Eliquis  (apixaban ) at prior dose tomorrow.                           - Repeat colonoscopy is not recommended for                            screening purposes.                           - Return to GI clinic PRN. Harry Lindau, MD 09/11/2023 9:58:15 AM

## 2023-09-11 NOTE — Progress Notes (Signed)
 Resume previous diet Continue present medications Await pathology results  Handouts/information given for polyps, diverticulosis and hemorrhoids

## 2023-09-11 NOTE — Patient Instructions (Addendum)
 Thank you for letting us  take care of your healthcare needs today. Please see handouts given to you on Diverticulosis. New Rx for Protonix  40 mg twice day for 6 weeks then daily. Carafate  suspension four times a day for 4 weeks. Resume Eliquis  tomorrow.     YOU HAD AN ENDOSCOPIC PROCEDURE TODAY AT THE [facility]:   Refer to the procedure report that was given to you for any specific questions about what was found during the examination.  If the procedure report does not answer your questions, please call your gastroenterologist to clarify.  If you requested that your care partner not be given the details of your procedure findings, then the procedure report has been included in a sealed envelope for you to review at your convenience later.  YOU SHOULD EXPECT: Some feelings of bloating in the abdomen. Passage of more gas than usual.  Walking can help get rid of the air that was put into your GI tract during the procedure and reduce the bloating. If you had a lower endoscopy (such as a colonoscopy or flexible sigmoidoscopy) you may notice spotting of blood in your stool or on the toilet paper. If you underwent a bowel prep for your procedure, you may not have a normal bowel movement for a few days.  Please Note:  You might notice some irritation and congestion in your nose or some drainage.  This is from the oxygen used during your procedure.  There is no need for concern and it should clear up in a day or so.  SYMPTOMS TO REPORT IMMEDIATELY:  Following lower endoscopy (colonoscopy or flexible sigmoidoscopy):  Excessive amounts of blood in the stool  Significant tenderness or worsening of abdominal pains  Swelling of the abdomen that is new, acute  Fever of 100F or higher    For urgent or emergent issues, a gastroenterologist can be reached at any hour by calling [phone]. Do not use MyChart messaging for urgent concerns.    DIET:  We do recommend a small meal at first,  but then you may proceed to your regular diet.  Drink plenty of fluids but you should avoid alcoholic beverages for 24 hours.  ACTIVITY:  You should plan to take it easy for the rest of today and you should NOT DRIVE or use heavy machinery until tomorrow (because of the sedation medicines used during the test).    FOLLOW UP: Our staff will call the number listed on your records the next business day following your procedure.  We will call around 7:15- 8:00 am to check on you and address any questions or concerns that you may have regarding the information given to you following your procedure. If we do not reach you, we will leave a message.     If any biopsies were taken you will be contacted by phone or by letter within the next 1-3 weeks.  Please call us  at [phone] if you have not heard about the biopsies in 3 weeks.    SIGNATURES/CONFIDENTIALITY: You and/or your care partner have signed paperwork which will be entered into your electronic medical record.  These signatures attest to the fact that that the information above on your After Visit Summary has been reviewed and is understood.  Full responsibility of the confidentiality of this discharge information lies with you and/or your care-partner.

## 2023-09-11 NOTE — Progress Notes (Signed)
 Called to room to assist during endoscopic procedure.  Patient ID and intended procedure confirmed with present staff. Received instructions for my participation in the procedure from the performing physician.

## 2023-09-12 ENCOUNTER — Encounter: Payer: Self-pay | Admitting: Internal Medicine

## 2023-09-12 ENCOUNTER — Telehealth: Payer: Self-pay | Admitting: *Deleted

## 2023-09-12 ENCOUNTER — Other Ambulatory Visit (HOSPITAL_COMMUNITY): Payer: Self-pay

## 2023-09-12 ENCOUNTER — Telehealth: Payer: Self-pay

## 2023-09-12 NOTE — Telephone Encounter (Signed)
  Follow up Call-     09/11/2023    8:27 AM  Call back number  Post procedure Call Back phone  # [phone]  Permission to leave phone message Yes     Patient questions:  Do you have a fever, pain , or abdominal swelling? No. Pain Score  0 *  Have you tolerated food without any problems? Yes.    Have you been able to return to your normal activities? Yes.    Do you have any questions about your discharge instructions: Diet   No. Medications  No. Follow up visit  No.  Do you have questions or concerns about your Care? No.  Actions: * If pain score is 4 or above: No action needed, pain <4.

## 2023-09-12 NOTE — Telephone Encounter (Signed)
 PA request has been Submitted. New Encounter has been or will be created for follow up. For additional info see Pharmacy Prior Auth telephone encounter from 09-12-2023.

## 2023-09-12 NOTE — Telephone Encounter (Signed)
 Pharmacy Patient Advocate Encounter  Received notification from [facility] that Prior Authorization for Sucralfate  1GM/10ML suspension  has been DENIED.  Full denial letter will be uploaded to the media tab. See denial reason below.

## 2023-09-12 NOTE — Telephone Encounter (Signed)
 Pharmacy Patient Advocate Encounter   Received notification from RX Request Messages that prior authorization for Sucralfate  1GM/10ML suspension is required/requested.   Insurance verification completed.   The patient is insured through [facility] .   Per test claim: PA required; PA submitted to above mentioned insurance via CoverMyMeds Key/confirmation #/EOC [id number] Status is pending

## 2023-09-15 LAB — SURGICAL PATHOLOGY

## 2023-09-15 MED ORDER — SUCRALFATE 1 G PO TABS: ORAL_TABLET | ORAL | 1 refills | Status: DC

## 2023-09-15 NOTE — Telephone Encounter (Signed)
 Rx sent to the pharmacy for sucralfate  slurry as requested.

## 2023-09-15 NOTE — Addendum Note (Signed)
 Addended by: Diantha Fossa on: 09/15/2023 10:45 AM   Modules accepted: Orders

## 2023-09-15 NOTE — Telephone Encounter (Signed)
 Patient aware that sucralfate  tablets to make slurry was sent to the pharmacy and can be picked up.  Patient agreed to plan and verbalized understanding.  No further questions.

## 2023-09-17 ENCOUNTER — Ambulatory Visit (HOSPITAL_COMMUNITY)
Admission: RE | Admit: 2023-09-17 | Discharge: 2023-09-17 | Disposition: A | Discharge status: Home / Self Care | Source: Ambulatory Visit | Attending: Internal Medicine | Admitting: Internal Medicine

## 2023-09-17 ENCOUNTER — Other Ambulatory Visit: Payer: Self-pay | Admitting: Internal Medicine

## 2023-09-17 DIAGNOSIS — C3490 Malignant neoplasm of unspecified part of unspecified bronchus or lung: Secondary | ICD-10-CM | POA: Insufficient documentation

## 2023-09-17 DIAGNOSIS — J90 Pleural effusion, not elsewhere classified: Secondary | ICD-10-CM | POA: Diagnosis not present

## 2023-09-17 DIAGNOSIS — N281 Cyst of kidney, acquired: Secondary | ICD-10-CM | POA: Diagnosis not present

## 2023-09-17 DIAGNOSIS — R188 Other ascites: Secondary | ICD-10-CM | POA: Diagnosis not present

## 2023-09-17 MED ORDER — IOHEXOL 9 MG/ML PO SOLN
1000.0000 mL | ORAL | Status: AC
  Administered 2023-09-17: 1000 mL via ORAL

## 2023-09-17 MED ORDER — HEPARIN SOD (PORK) LOCK FLUSH 100 UNIT/ML IV SOLN
500.0000 [IU] | Freq: Once | INTRAVENOUS | Status: AC
Start: 2023-09-17 — End: 2023-09-17
  Administered 2023-09-17: 500 [IU] via INTRAVENOUS

## 2023-09-17 MED ORDER — IOHEXOL 300 MG/ML  SOLN
100.0000 mL | Freq: Once | INTRAMUSCULAR | Status: AC | PRN
  Administered 2023-09-17: 100 mL via INTRAVENOUS

## 2023-09-17 MED ORDER — IOHEXOL 9 MG/ML PO SOLN
ORAL | Status: AC
Start: 2023-09-17 — End: ?
  Filled 2023-09-17: qty 1000

## 2023-09-18 ENCOUNTER — Other Ambulatory Visit: Payer: Self-pay | Admitting: Internal Medicine

## 2023-09-20 ENCOUNTER — Encounter: Payer: Self-pay | Admitting: Gastroenterology

## 2023-09-21 NOTE — Progress Notes (Unsigned)
 [facility] OFFICE PROGRESS NOTE  April Muslim, FNP [address] [place] [address] [place] [address]  DIAGNOSIS: Stage IV (T2b, N2, M1 C) non-small cell lung cancer, adenocarcinoma presented with right hilar mass in addition to mediastinal and upper abdominal lymphadenopathy in addition to bilateral adrenal metastases and metastatic disease in the musculature of the lower back.  This was diagnosed in April 2023.   DETECTED ALTERATION(S) / BIOMARKER(S)     % CFDNA OR AMPLIFICATION        ASSOCIATED FDA-APPROVED THERAPIES         CLINICAL TRIAL AVAILABILITY [id number] 0.5% None     Yes [id number] (Splice Site Indel) 0.6% None     Yes   PD-L1 expression 0%  PRIOR THERAPY: Palliative radiotherapy to the right hilar region under the care of Dr. Jeryl Moris   CURRENT THERAPY:  Systemic chemotherapy with carboplatin  for AUC of 5, Alimta 500 Mg/M2 and Keytruda  200 Mg IV every 3 weeks.  First dose August 29, 2021. Status post 35 cycles.  Starting from cycle #5, the patient started maintenance Alimta and Keytruda .  Her dose of Alimta was reduced to 400 mg/m starting from cycle #29 due to worsening anemia. Treatment break starting from 09/25/23  INTERVAL HISTORY: April Walsh 58 y.o. female returns to the clinic today for a follow-up visit accompanied by her aunt.  The patient was last seen by Dr. Marguerita Shih. The patient undergoes treatment with Alimta and Keytruda .   The more cumulative doses of treatment, she has been having some more fatigue and shortness of breath.  She is also been having some mild anasarca.  She has bilateral pleural effusions.  Has had a thoracentesis in the past.  She has some wheezing for which she uses Breztri , nebulizers, and her rescue inhaler.  She denies any fever, chills, night sweats, vomiting, nausea, diarrhea, or constipation.  Has a history of anemia but her hemoglobin is currently 10.2.  She continues to have fatigue which is affecting her  day-to-day activities such as grocery shopping or moving around her house.  She is interested in taking a break from treatment if her scan is stable.  She also has a cough which produces white sputum.  She used Delsym in the past which was effective.  She recently underwent a colonoscopy, which revealed some ulcers. She is currently taking Protonix  for this condition.  She is here today for evaluation and repeat blood work before undergoing cycle #36    MEDICAL HISTORY: Past Medical History:  Diagnosis Date   Anemia    Asthma    Basal cell carcinoma    Bronchitis    Cervical cancer (HCC)    COPD (chronic obstructive pulmonary disease) (HCC)    "early stages of COPD"   Headache    HLD (hyperlipidemia)    Lung cancer (HCC) 08/13/2021    ALLERGIES:  is allergic to remeron  [mirtazapine ].  MEDICATIONS:  Current Outpatient Medications  Medication Sig Dispense Refill   albuterol  (ACCUNEB ) 0.63 MG/3ML nebulizer solution Take 3 mLs (0.63 mg total) by nebulization every 4 (four) hours as needed for wheezing or shortness of breath. 75 mL 11   albuterol  (VENTOLIN  HFA) 108 (90 Base) MCG/ACT inhaler Inhale 2 puffs into the lungs every 6 (six) hours as needed for wheezing or shortness of breath. 8 g 8   apixaban  (ELIQUIS ) 2.5 MG TABS tablet Take 1 tablet (2.5 mg total) by mouth 2 (two) times daily. 60 tablet 0   atorvastatin  (LIPITOR) 80 MG  tablet TAKE 1 TABLET BY MOUTH EVERY DAY (Patient not taking: Reported on 09/11/2023) 30 tablet 3   Budeson-Glycopyrrol-Formoterol  (BREZTRI  AEROSPHERE) 160-9-4.8 MCG/ACT AERO Inhale 2 puffs into the lungs in the morning and at bedtime. 10.7 each 10   folic acid  (FOLVITE ) 1 MG tablet TAKE 1 TABLET BY MOUTH EVERY DAY 30 tablet 2   lidocaine -prilocaine  (EMLA ) cream APPLY TOPICALLY 1 APPLICATION AS NEEDED 30 g 2   Na Sulfate-K Sulfate-Mg Sulfate concentrate (SUPREP) 17.5-3.13-1.6 GM/177ML SOLN TAKE 1 KIT (354 MLS TOTAL) BY MOUTH ONCE FOR 1 DOSE.     pantoprazole   (PROTONIX ) 40 MG tablet Take 1 tablet (40 mg total) by mouth 2 (two) times daily. Twice day for 6 weeks then 40 mg daily. 180 tablet 3   prochlorperazine  (COMPAZINE ) 10 MG tablet Take 1 tablet (10 mg total) by mouth every 6 (six) hours as needed. 30 tablet 2   sucralfate  (CARAFATE ) 1 g tablet Crush 1 tablet and dissolve in 10ml of warm water, mix well to create slurry and drink every 4 times daily for 4 weeks. 120 tablet 1   No current facility-administered medications for this visit.   Facility-Administered Medications Ordered in Other Visits  Medication Dose Route Frequency Provider Last Rate Last Admin   cyanocobalamin  (VITAMIN B12) 1000 MCG/ML injection            prochlorperazine  (COMPAZINE ) 10 MG tablet             SURGICAL HISTORY:  Past Surgical History:  Procedure Laterality Date   APPENDECTOMY     1996   BREAST BIOPSY Right    2010-2015   BRONCHIAL BIOPSY  08/13/2021   Procedure: BRONCHIAL BIOPSIES;  Surgeon: Denson Flake, MD;  Location: [facility] ENDOSCOPY;  Service: Pulmonary;;   BRONCHIAL BRUSHINGS  08/13/2021   Procedure: BRONCHIAL BRUSHINGS;  Surgeon: Denson Flake, MD;  Location: [facility] ENDOSCOPY;  Service: Pulmonary;;   BRONCHIAL NEEDLE ASPIRATION BIOPSY  08/13/2021   Procedure: BRONCHIAL NEEDLE ASPIRATION BIOPSIES;  Surgeon: Denson Flake, MD;  Location: [facility] ENDOSCOPY;  Service: Pulmonary;;   CESAREAN SECTION     1984, 1987   FOOT SURGERY Right    2 pins and screw 1999   FOOT SURGERY Left 06/21/2021   screw and 2 pins   HEMOSTASIS CONTROL  08/13/2021   Procedure: HEMOSTASIS CONTROL;  Surgeon: Denson Flake, MD;  Location: [facility] ENDOSCOPY;  Service: Pulmonary;;   IR IMAGING GUIDED PORT INSERTION  11/26/2021   MELANOMA EXCISION Left    basal cell melanoma 2011   PARTIAL HYSTERECTOMY     1994   TUBAL LIGATION     1987   VIDEO BRONCHOSCOPY  08/13/2021   Procedure: VIDEO BRONCHOSCOPY WITHOUT FLUORO;  Surgeon: Denson Flake, MD;  Location: [facility] ENDOSCOPY;  Service: Pulmonary;;    VIDEO BRONCHOSCOPY WITH ENDOBRONCHIAL ULTRASOUND N/A 08/13/2021   Procedure: VIDEO BRONCHOSCOPY WITH ENDOBRONCHIAL ULTRASOUND;  Surgeon: Denson Flake, MD;  Location: [facility] ENDOSCOPY;  Service: Pulmonary;  Laterality: N/A;    REVIEW OF SYSTEMS:   Review of Systems  Constitutional: Positive for fatigue. Negative for appetite change, chills, fever and unexpected weight change.  HENT:   Negative for mouth sores, nosebleeds, sore throat and trouble swallowing.   Eyes: Negative for eye problems and icterus.  Respiratory: Positive for shortness of breath and cough. Negative for hemoptysis and wheezing.   Cardiovascular: Negative for chest pain and leg swelling.  Gastrointestinal: Negative for abdominal pain, constipation, diarrhea, nausea and vomiting.  Genitourinary: Negative for bladder  incontinence, difficulty urinating, dysuria, frequency and hematuria.   Musculoskeletal: Negative for back pain, gait problem, neck pain and neck stiffness.  Skin: Negative for itching and rash.  Neurological: Negative for dizziness, extremity weakness, gait problem, headaches, light-headedness and seizures.  Hematological: Negative for adenopathy. Does not bruise/bleed easily.  Psychiatric/Behavioral: Negative for confusion, depression and sleep disturbance. The patient is not nervous/anxious.     PHYSICAL EXAMINATION:  Blood pressure 133/86, pulse (!) 113, temperature 97.9 F (36.6 C), resp. rate 16, weight 106 lb 6.4 oz (48.3 kg), SpO2 96%.  ECOG PERFORMANCE STATUS: 1  Physical Exam  Constitutional: Oriented to person, place, and time and thin appearing remale and in no distress.  HENT:  Head: Normocephalic and atraumatic.  Mouth/Throat: Oropharynx is clear and moist. No oropharyngeal exudate.  Eyes: Conjunctivae are normal. Right eye exhibits no discharge. Left eye exhibits no discharge. No scleral icterus.  Neck: Normal range of motion. Neck supple.  Cardiovascular: Normal rate, regular rhythm, normal  heart sounds and intact distal pulses.   Pulmonary/Chest: Effort normal. Quiet breath sounds at the base of the lungs bilaterally with mild faint wheezing. No respiratory distress.  No rales.  Abdominal: Soft. Bowel sounds are normal. Exhibits no distension and no mass. There is no tenderness.  Musculoskeletal: Normal range of motion. Exhibits no edema.  Lymphadenopathy:    No cervical adenopathy.  Neurological: Alert and oriented to person, place, and time. Exhibits normal muscle tone. Gait normal. Coordination normal.  Skin: Skin is warm and dry. No rash noted. Not diaphoretic. No erythema. No pallor.  Psychiatric: Mood, memory and judgment normal.  Vitals reviewed.  LABORATORY DATA: Lab Results  Component Value Date   WBC 7.2 09/25/2023   HGB 10.2 (L) 09/25/2023   HCT 31.0 (L) 09/25/2023   MCV 114.8 (H) 09/25/2023   PLT 334 09/25/2023      Chemistry      Component Value Date/Time   NA 138 09/25/2023 0833   K 3.6 09/25/2023 0833   CL 106 09/25/2023 0833   CO2 28 09/25/2023 0833   BUN 9 09/25/2023 0833   CREATININE 0.82 09/25/2023 0833      Component Value Date/Time   CALCIUM  8.8 (L) 09/25/2023 0833   ALKPHOS 75 09/25/2023 0833   AST 22 09/25/2023 0833   ALT 10 09/25/2023 0833   BILITOT 0.2 09/25/2023 0833       RADIOGRAPHIC STUDIES:  CT CHEST ABDOMEN PELVIS W CONTRAST Result Date: 09/17/2023 CLINICAL DATA:  History of stage IV lung cancer, follow-up. * Tracking Code: BO * EXAM: CT CHEST, ABDOMEN, AND PELVIS WITH CONTRAST TECHNIQUE: Multidetector CT imaging of the chest, abdomen and pelvis was performed following the standard protocol during bolus administration of intravenous contrast. RADIATION DOSE REDUCTION: This exam was performed according to the departmental dose-optimization program which includes automated exposure control, adjustment of the mA and/or kV according to patient size and/or use of iterative reconstruction technique. CONTRAST:  OMNIPAQUE  IOHEXOL   300 MG/ML  SOLN COMPARISON:  Multiple priors including most recent CT June 26, 2023 FINDINGS: CT CHEST FINDINGS Cardiovascular: Accessed right chest Port-A-Cath with tip in the right atrium. Normal size heart. No significant pericardial effusion/thickening. Mediastinum/Nodes: No suspicious thyroid  nodule. No pathologically enlarged mediastinal, hilar or axillary lymph nodes. Mild symmetric distal esophageal wall thickening. Lungs/Pleura: Similar soft tissue thickening about the right hilum with stranding into the middle mediastinum. Right perihilar problem loss with architectural distortion, interstitial thickening and airway thickening similar to prior examination including a nodular component along  superior aspect measuring 2.2 x 1.4 cm on image 45/6 previously 2.2 x 1.5 cm. New 7 mm pulmonary nodule in the superior segment of the right lower lobe on image 39/6. Multifocal interstitial/interlobular septal thickening with multifocal patchy ground-glass opacities are similar prior examination. Increased size of the moderate left with stable moderate right pleural effusion. Musculoskeletal: No aggressive lytic or blastic lesion of bone. Remote right-sided rib fractures. CT ABDOMEN PELVIS FINDINGS Hepatobiliary: No suspicious hepatic lesion. Gallbladder is unremarkable. No biliary ductal dilation. Pancreas: No pancreatic ductal dilation or evidence of acute inflammation. Spleen: No splenomegaly. Adrenals/Urinary Tract: No suspicious adrenal nodule/mass. Left lower pole renal cyst. No hydronephrosis. Kidneys demonstrate symmetric enhancement. Urinary bladder is unremarkable for degree of distension. Stomach/Bowel: Stomach is unremarkable for degree of distension. No pathologic dilation of small or large bowel. Sigmoid colonic diverticulosis. Vascular/Lymphatic: Aortic atherosclerosis. The portal, splenic and superior mesenteric veins are patent. No pathologically enlarged abdominal or pelvic lymph nodes.  Reproductive: Status post hysterectomy. No adnexal masses. Other: Slightly increased small volume ascites. Similar subcutaneous edema. Musculoskeletal: No aggressive lytic or blastic lesion of bone. Multilevel degenerative change of the spine. IMPRESSION: 1. New 7 mm pulmonary nodule in the superior segment of the right lower lobe, appearances more suggestive of an inflammatory/infectious process however it is nonspecific and warrants attention on short-term interval follow-up imaging. 2. Similar soft tissue thickening about the right hilum with stranding into the middle mediastinum. 3. Similar multifocal interstitial/interlobular septal thickening with multifocal patchy ground-glass opacities. 4. Increased size of the moderate left with stable moderate right pleural effusion. 5. Slightly increased small volume ascites with similar subcutaneous edema. 6. Mild symmetric distal esophageal wall thickening, which may reflect esophagitis Electronically Signed   By: Tama Fails M.D.   On: 09/17/2023 17:12     ASSESSMENT/PLAN:  This is a very pleasant 58 years old Caucasian female with Stage IV (T2b, N2, M1 C) non-small cell lung cancer, adenocarcinoma presented with right hilar mass in addition to mediastinal and upper abdominal lymphadenopathy in addition to bilateral adrenal metastases and metastatic disease in the musculature of the lower back.  This was diagnosed in April 2023.   The molecular studies by [employer] showed no actionable mutations and the patient has negative PD-L1 expression. She completed palliative radiotherapy to the right hilar region under the care of Dr. Jeryl Moris.   She completed palliative radiation to the right hilar area under the care of Dr. Jeryl Moris.   She is currently undergoing palliative systemic chemotherapy with carboplatin  for an AUC of 5, Alimta 500 mg per metered squared, Keytruda  200 mg IV every 3 weeks.  She is status post 31 cycles.  Her first dose was on 08/29/2021.  Starting from cycle #5, she started maintenance alimta and keytruda . Starting from cycle #29, Dr. Marguerita Shih reduced her alimta to 400 mg/m2 due to worsening anemia.   The patient is hopeful to take a treatment break if her scan is stable.  The patient was seen with Dr. Marguerita Shih.  Dr. Marguerita Shih personally and independently reviewed the scan and discussed results with the patient today.  There is a small 7 mm pulmonary nodule but is likely infectious or inflammatory.  Otherwise her scan is stable.  She does have bilateral effusions.  We will arrange for thoracentesis of the left pleural effusion.  Will leave it up to the discretion of radiology if after evaluation they feel that she should undergo right thoracentesis instead.  May consider her for repeat thoracentesis of the alternate side if  no improvement.   Given her stable exam Dr. Marguerita Shih is in agreement to allow a treatment break for her to recover from some of the cumulative side effects of treatment.  We will cancel her infusion and I have put her care plan on hold.   I will bring her in in 6 weeks for a port flush appointment.  I will cancel her other scheduled chemotherapies at this time.  I will arrange for restaging CT scan of her chest in 3 months.  We will see her 1 week later to review the results.  If and when we resume her treatment, we may resume with single agent chemotherapy with Alimta since she has completed 2 years of Keytruda .  Will continue on Protonix  for her ulcers.  She will use Delsym for cough and continue with her inhalers.  The patient was advised to call immediately if she has any concerning symptoms in the interval. The patient voices understanding of current disease status and treatment options and is in agreement with the current care plan. All questions were answered. The patient knows to call the clinic with any problems, questions or concerns. We can certainly see the patient much sooner if necessary    Orders  Placed This Encounter  Procedures   US  THORACENTESIS ASP PLEURAL SPACE W/IMG GUIDE    Standing Status:   Future    Expected Date:   09/26/2023    Expiration Date:   09/24/2024    Are labs required for specimen collection?:   No    Reason for Exam (SYMPTOM  OR DIAGNOSIS REQUIRED):   Bilaeral pleural effusions. Lung cancer. Please drain left first unless you think after review that the right should be drained  that is fine too    Preferred imaging location?:   [facility]   CT CHEST ABDOMEN PELVIS W CONTRAST    Standing Status:   Future    Expected Date:   12/26/2023    Expiration Date:   09/24/2024    If indicated for the ordered procedure, I authorize the administration of contrast media per Radiology protocol:   Yes    Does the patient have a contrast media/X-ray dye allergy?:   Yes    Preferred imaging location?:   [facility]    If indicated for the ordered procedure, I authorize the administration of oral contrast media per Radiology protocol:   Yes     April Ulloa L Cormick Moss, PA-C 09/25/23  ADDENDUM: Hematology/Oncology Attending: I had a face-to-face encounter with the patient.  I reviewed her records, lab, scan and recommended her care plan.  This is a very pleasant 58 years old white female with a stage IV non-small cell lung cancer, adenocarcinoma presented with right hilar mass in addition to mediastinal and upper abdominal lymphadenopathy in addition to bilateral adrenal metastasis and metastatic disease in the musculature of the lower back diagnosed in April 2023.  She has no actionable mutation and negative PD-L1 expression.  The patient started palliative systemic chemotherapy initially with carboplatin , Alimta and Keytruda  for 4 cycles followed by maintenance treatment with Alimta and Keytruda  and she completed 35 cycles. She has a rough time with her treatment lately with increasing fatigue and weakness. She had repeat CT scan of the chest, abdomen and  pelvis performed recently.  I personally and independently reviewed the scan and discussed the result with the patient and her aunt. Her scan showed no concerning findings for disease progression but there was bilateral moderate pleural  effusion. I recommended for the patient to continue on observation with repeat CT scan of the chest in 3 months. For the bilateral pleural effusion, we will start with ultrasound-guided left thoracentesis first and if needed we will repeat it on the right side. The patient was advised to call immediately if she has any other concerning symptoms in the interval. The total time spent in the appointment was 30 minutes including review of chart and various tests results, discussions about plan of care and coordination of care plan . Disclaimer: This note was dictated with voice recognition software. Similar sounding words can inadvertently be transcribed and may be missed upon review. Aurelio Blower, MD

## 2023-09-22 ENCOUNTER — Other Ambulatory Visit: Payer: Self-pay | Admitting: Physician Assistant

## 2023-09-22 DIAGNOSIS — C3401 Malignant neoplasm of right main bronchus: Secondary | ICD-10-CM

## 2023-09-23 ENCOUNTER — Ambulatory Visit: Payer: BC Managed Care – PPO | Admitting: Nurse Practitioner

## 2023-09-25 ENCOUNTER — Inpatient Hospital Stay: Attending: Radiation Oncology | Admitting: Physician Assistant

## 2023-09-25 ENCOUNTER — Inpatient Hospital Stay: Attending: Radiation Oncology

## 2023-09-25 ENCOUNTER — Telehealth: Payer: Self-pay

## 2023-09-25 ENCOUNTER — Other Ambulatory Visit: Payer: Self-pay | Admitting: Internal Medicine

## 2023-09-25 ENCOUNTER — Inpatient Hospital Stay

## 2023-09-25 VITALS — BP 133/86 | HR 113 | Temp 97.9°F | Resp 16 | Wt 106.4 lb

## 2023-09-25 DIAGNOSIS — Z923 Personal history of irradiation: Secondary | ICD-10-CM | POA: Insufficient documentation

## 2023-09-25 DIAGNOSIS — K259 Gastric ulcer, unspecified as acute or chronic, without hemorrhage or perforation: Secondary | ICD-10-CM | POA: Insufficient documentation

## 2023-09-25 DIAGNOSIS — C7972 Secondary malignant neoplasm of left adrenal gland: Secondary | ICD-10-CM | POA: Insufficient documentation

## 2023-09-25 DIAGNOSIS — J90 Pleural effusion, not elsewhere classified: Secondary | ICD-10-CM | POA: Insufficient documentation

## 2023-09-25 DIAGNOSIS — C7971 Secondary malignant neoplasm of right adrenal gland: Secondary | ICD-10-CM | POA: Diagnosis not present

## 2023-09-25 DIAGNOSIS — Z79899 Other long term (current) drug therapy: Secondary | ICD-10-CM | POA: Insufficient documentation

## 2023-09-25 DIAGNOSIS — Z9851 Tubal ligation status: Secondary | ICD-10-CM | POA: Insufficient documentation

## 2023-09-25 DIAGNOSIS — R062 Wheezing: Secondary | ICD-10-CM | POA: Diagnosis not present

## 2023-09-25 DIAGNOSIS — Z8541 Personal history of malignant neoplasm of cervix uteri: Secondary | ICD-10-CM | POA: Insufficient documentation

## 2023-09-25 DIAGNOSIS — D649 Anemia, unspecified: Secondary | ICD-10-CM

## 2023-09-25 DIAGNOSIS — R0602 Shortness of breath: Secondary | ICD-10-CM | POA: Diagnosis not present

## 2023-09-25 DIAGNOSIS — Z9049 Acquired absence of other specified parts of digestive tract: Secondary | ICD-10-CM | POA: Diagnosis not present

## 2023-09-25 DIAGNOSIS — C3491 Malignant neoplasm of unspecified part of right bronchus or lung: Secondary | ICD-10-CM | POA: Insufficient documentation

## 2023-09-25 DIAGNOSIS — T451X5D Adverse effect of antineoplastic and immunosuppressive drugs, subsequent encounter: Secondary | ICD-10-CM | POA: Insufficient documentation

## 2023-09-25 DIAGNOSIS — Z85820 Personal history of malignant melanoma of skin: Secondary | ICD-10-CM | POA: Diagnosis not present

## 2023-09-25 DIAGNOSIS — R5383 Other fatigue: Secondary | ICD-10-CM | POA: Insufficient documentation

## 2023-09-25 DIAGNOSIS — R059 Cough, unspecified: Secondary | ICD-10-CM | POA: Diagnosis not present

## 2023-09-25 DIAGNOSIS — Z95828 Presence of other vascular implants and grafts: Secondary | ICD-10-CM

## 2023-09-25 DIAGNOSIS — D6481 Anemia due to antineoplastic chemotherapy: Secondary | ICD-10-CM | POA: Insufficient documentation

## 2023-09-25 LAB — TYPE AND SCREEN
ABO/RH(D): A POS
Antibody Screen: POSITIVE

## 2023-09-25 LAB — CBC WITH DIFFERENTIAL (CANCER CENTER ONLY)
Abs Immature Granulocytes: 0.06 10*3/uL (ref 0.00–0.07)
Basophils Absolute: 0 10*3/uL (ref 0.0–0.1)
Basophils Relative: 1 %
Eosinophils Absolute: 0.1 10*3/uL (ref 0.0–0.5)
Eosinophils Relative: 1 %
HCT: 31 % — ABNORMAL LOW (ref 36.0–46.0)
Hemoglobin: 10.2 g/dL — ABNORMAL LOW (ref 12.0–15.0)
Immature Granulocytes: 1 %
Lymphocytes Relative: 12 %
Lymphs Abs: 0.9 10*3/uL (ref 0.7–4.0)
MCH: 37.8 pg — ABNORMAL HIGH (ref 26.0–34.0)
MCHC: 32.9 g/dL (ref 30.0–36.0)
MCV: 114.8 fL — ABNORMAL HIGH (ref 80.0–100.0)
Monocytes Absolute: 1.4 10*3/uL — ABNORMAL HIGH (ref 0.1–1.0)
Monocytes Relative: 19 %
Neutro Abs: 4.7 10*3/uL (ref 1.7–7.7)
Neutrophils Relative %: 66 %
Platelet Count: 334 10*3/uL (ref 150–400)
RBC: 2.7 MIL/uL — ABNORMAL LOW (ref 3.87–5.11)
RDW: 16 % — ABNORMAL HIGH (ref 11.5–15.5)
WBC Count: 7.2 10*3/uL (ref 4.0–10.5)
nRBC: 0 % (ref 0.0–0.2)

## 2023-09-25 LAB — CMP (CANCER CENTER ONLY)
ALT: 10 U/L (ref 0–44)
AST: 22 U/L (ref 15–41)
Albumin: 2.9 g/dL — ABNORMAL LOW (ref 3.5–5.0)
Alkaline Phosphatase: 75 U/L (ref 38–126)
Anion gap: 4 — ABNORMAL LOW (ref 5–15)
BUN: 9 mg/dL (ref 6–20)
CO2: 28 mmol/L (ref 22–32)
Calcium: 8.8 mg/dL — ABNORMAL LOW (ref 8.9–10.3)
Chloride: 106 mmol/L (ref 98–111)
Creatinine: 0.82 mg/dL (ref 0.44–1.00)
GFR, Estimated: >60 mL/min (ref >60)
Glucose, Bld: 74 mg/dL (ref 70–99)
Potassium: 3.6 mmol/L (ref 3.5–5.1)
Sodium: 138 mmol/L (ref 135–145)
Total Bilirubin: 0.2 mg/dL (ref 0.0–1.2)
Total Protein: 6.4 g/dL — ABNORMAL LOW (ref 6.5–8.1)

## 2023-09-25 LAB — SAMPLE TO BLOOD BANK

## 2023-09-25 MED ORDER — SODIUM CHLORIDE 0.9% FLUSH
10.0000 mL | Freq: Once | INTRAVENOUS | Status: AC
  Administered 2023-09-25: 10 mL

## 2023-09-25 NOTE — Telephone Encounter (Signed)
 Patient was seen in the office today with Cassie, PA. An order for a thoracentesis was placed. Patient was provided with the appropriate contact number to call and schedule the procedure. Patient verbalized understanding.

## 2023-09-26 ENCOUNTER — Telehealth: Payer: Self-pay | Admitting: Internal Medicine

## 2023-09-26 NOTE — Telephone Encounter (Signed)
 Scheduled appointments with the patient and she is aware.

## 2023-09-30 ENCOUNTER — Encounter: Payer: Self-pay | Admitting: Nurse Practitioner

## 2023-09-30 ENCOUNTER — Ambulatory Visit: Admitting: Nurse Practitioner

## 2023-09-30 VITALS — BP 132/80 | HR 111 | Ht 60.0 in | Wt 109.0 lb

## 2023-09-30 DIAGNOSIS — J90 Pleural effusion, not elsewhere classified: Secondary | ICD-10-CM | POA: Diagnosis not present

## 2023-09-30 DIAGNOSIS — C3491 Malignant neoplasm of unspecified part of right bronchus or lung: Secondary | ICD-10-CM | POA: Diagnosis not present

## 2023-09-30 DIAGNOSIS — J441 Chronic obstructive pulmonary disease with (acute) exacerbation: Secondary | ICD-10-CM

## 2023-09-30 MED ORDER — METHYLPREDNISOLONE ACETATE 80 MG/ML IJ SUSP
80.0000 mg | Freq: Once | INTRAMUSCULAR | Status: AC
  Administered 2023-09-30: 80 mg via INTRAMUSCULAR

## 2023-09-30 MED ORDER — PREDNISONE 10 MG PO TABS: ORAL_TABLET | ORAL | 0 refills | Status: DC

## 2023-09-30 NOTE — Assessment & Plan Note (Signed)
 Concurrent chemoradiation and Keytruda . Last scan was stable aside from increased effusion. See above. Will need to closely monitor off therapy. No evidence of progression of ground glass opacities at this time. Follow up with oncology as scheduled.

## 2023-09-30 NOTE — Assessment & Plan Note (Signed)
 AECOPD with symptoms exacerbated by pleural effusion. Will treat her with depo 80 mg inj x 1 and prednisone taper. Target mucociliary clearance and continue bronchodilator regimen. Action plan in place. Close follow up. Strict ED precautions

## 2023-09-30 NOTE — Progress Notes (Signed)
 @Patient  ID: April Walsh, female    DOB: [birth year], 58 y.o.   MRN: [id number]  Chief Complaint  Patient presents with   Follow-up    Patient states her breathing has not improved and it  feels like she's not getting enough oxygen in her lungs.    Referring provider: [name], *  HPI: 58 year old female, former smoker followed for COPD and adenocarcinoma. She is a patient of Dr. Lacinda Pica and last seen in office 06/25/2023. Past medical history significant for cervical cancer, migraines, depression, hx of CVA, anxiety, depression.   TEST/EVENTS:  09/17/2023 CT chest: Port-A-Cath. Distal esophageal wall thickening. Similar soft tissue thickening about the right hilum, middle mediastinum. Right perihilar problem loss with architectural distortion, interstitial thickening and airway thickening similar to prior examination including a nodular component, 2.2x1.4 cm and stable. New 7 mm nodule RLL. Multifocal interstitial/interlobular septal thickening with patchy ground glass, stable. Increased size moderate left effusion.    06/25/2023: OV with Dr. Baldwin Levee. Stage IV adenocarcinoma of the right lung diagnosed in 08/2021 by bronchoscopy. Treated with concurrent chemoradiation and immune therapy with Keytruda . Course complicated by pleural effusions requiring thoracenteses, last 02/2023. Intermittent wheezing. Intermittent SOB with exertion. Associated dizziness and weakness in setting of anemia end of 2024, treated with PRBC. Improved but still with exertional SOB with chores, notable decrease in functional capacity. Remains on Breztri . Got albuterol  HFA from oncology; feels this helps. Repeat CT scheduled. Question if she is reaccumulating pleural fluid, may benefit from repeat thora. Will await CT imaging. Note that she also has increased risk for pneumonitis with Keytruda . If progressive groundglass infiltrates, will need to discuss with Dr. Marguerita Shih. Rx for albuterol  nebs.   09/30/2023: Today -  follow up Discussed the use of AI scribe software for clinical note transcription with the patient, who gave verbal consent to proceed.  History of Present Illness   April Walsh is a 58 year old female with lung cancer who presents with worsening breathing difficulties.  She has been experiencing worsening breathing difficulties over the last couple of months. CT chest showed increasing left pleural effusion. She is scheduled for a thoracentesis in two days, with her last procedure having been in October 2024. She experienced relief until late December 2024, when symptoms began to worsen again.   Since January 2025, she has experienced significant fatigue, stating that simple activities like getting out of a recliner leave her 'wore out.'   She is planned to take a break from cancer treatment as aside from the pleural effusion, her scans are stable.   Her hemoglobin levels are still low but overall improved, currently at 10 g/dL. She uses Breztri  and a rescue inhaler, and she uses a nebulizer approximately every three to four days. She experiences wheezing and a cough producing whitish phlegm. No fever or hemoptysis. Her appetite is reduced, but she describes her eating as normal for her since she was diagnosed with cancer and on treatment.   No leg swelling, orthopnea, PND.       Allergies  Allergen Reactions   Remeron  [Mirtazapine ] Other (See Comments)    States she slept for 5 days    Immunization History  Administered Date(s) Administered   Influenza Split 05/09/2015   PNEUMOCOCCAL CONJUGATE-20 08/08/2021    Past Medical History:  Diagnosis Date   Anemia    Asthma    Basal cell carcinoma    Bronchitis    Cervical cancer (HCC)    COPD (chronic obstructive pulmonary  disease) (HCC)    "early stages of COPD"   Headache    HLD (hyperlipidemia)    Lung cancer (HCC) 08/13/2021    Tobacco History: Social History   Tobacco Use  Smoking Status Former   Current packs/day:  0.00   Average packs/day: 0.5 packs/day for 32.0 years (16.0 ttl pk-yrs)   Types: Cigarettes   Quit date: 08/11/2021   Years since quitting: 2.1   Passive exposure: Past  Smokeless Tobacco Never  Tobacco Comments   1/2 pack smoked daily ARJ, RN 08/02/21   Counseling given: Not Answered Tobacco comments: 1/2 pack smoked daily ARJ, RN 08/02/21   Outpatient Medications Prior to Visit  Medication Sig Dispense Refill   albuterol  (ACCUNEB ) 0.63 MG/3ML nebulizer solution Take 3 mLs (0.63 mg total) by nebulization every 4 (four) hours as needed for wheezing or shortness of breath. 75 mL 11   albuterol  (VENTOLIN  HFA) 108 (90 Base) MCG/ACT inhaler Inhale 2 puffs into the lungs every 6 (six) hours as needed for wheezing or shortness of breath. 8 g 8   apixaban  (ELIQUIS ) 2.5 MG TABS tablet Take 1 tablet (2.5 mg total) by mouth 2 (two) times daily. 60 tablet 0   Budeson-Glycopyrrol-Formoterol  (BREZTRI  AEROSPHERE) 160-9-4.8 MCG/ACT AERO Inhale 2 puffs into the lungs in the morning and at bedtime. 10.7 each 10   folic acid  (FOLVITE ) 1 MG tablet TAKE 1 TABLET BY MOUTH EVERY DAY 30 tablet 2   lidocaine -prilocaine  (EMLA ) cream APPLY TOPICALLY 1 APPLICATION AS NEEDED 30 g 2   Na Sulfate-K Sulfate-Mg Sulfate concentrate (SUPREP) 17.5-3.13-1.6 GM/177ML SOLN TAKE 1 KIT (354 MLS TOTAL) BY MOUTH ONCE FOR 1 DOSE.     pantoprazole  (PROTONIX ) 40 MG tablet Take 1 tablet (40 mg total) by mouth 2 (two) times daily. Twice day for 6 weeks then 40 mg daily. 180 tablet 3   prochlorperazine  (COMPAZINE ) 10 MG tablet Take 1 tablet (10 mg total) by mouth every 6 (six) hours as needed. 30 tablet 2   sucralfate  (CARAFATE ) 1 g tablet Crush 1 tablet and dissolve in 10ml of warm water, mix well to create slurry and drink every 4 times daily for 4 weeks. 120 tablet 1   atorvastatin  (LIPITOR) 80 MG tablet TAKE 1 TABLET BY MOUTH EVERY DAY (Patient not taking: Reported on 09/30/2023) 30 tablet 3   Facility-Administered Medications Prior to  Visit  Medication Dose Route Frequency Provider Last Rate Last Admin   cyanocobalamin  (VITAMIN B12) 1000 MCG/ML injection            prochlorperazine  (COMPAZINE ) 10 MG tablet              Review of Systems:   Constitutional: No weight loss or gain, night sweats, fevers, chills +fatigue, lassitude. HEENT: No headaches, difficulty swallowing, tooth/dental problems, or sore throat. No sneezing, itching, ear ache, nasal congestion, or post nasal drip CV:  No chest pain, orthopnea, PND, swelling in lower extremities, anasarca, dizziness, palpitations, syncope Resp: + shortness of breath with exertion; cough; wheeze.  No hemoptysis. No chest wall deformity GI:  No heartburn, indigestion, abdominal pain, nausea, vomiting, diarrhea, change in bowel habits, bloody stools.  GU: No dysuria, change in color of urine, urgency or frequency.  No flank pain, no hematuria  Skin: No rash, lesions, ulcerations MSK:  No joint pain or swelling.  Neuro: No dizziness or lightheadedness.  Psych: No depression or anxiety. Mood stable.     Physical Exam:  BP 132/80 (BP Location: Right Arm, Patient Position: Sitting, Cuff Size:  Normal)   Pulse (!) 111   Ht 5' (1.524 m)   Wt 109 lb (49.4 kg)   SpO2 97%   BMI 21.29 kg/m   GEN: Pleasant, interactive, well-kempt; in no acute distress HEENT:  Normocephalic and atraumatic. PERRLA. Sclera white. Nasal turbinates pink, moist and patent bilaterally. No rhinorrhea present. Oropharynx pink and moist, without exudate or edema. No lesions, ulcerations, or postnasal drip.  NECK:  Supple w/ fair ROM. No JVD present. Thyroid  symmetrical with no goiter or nodules palpated. No lymphadenopathy.   CV: RRR, no m/r/g, no peripheral edema. Pulses intact, +2 bilaterally. No cyanosis, pallor or clubbing. PULMONARY:  Unlabored, regular breathing. Diminished L>R with scattered wheezes bilaterally A&P. No accessory muscle use.  GI: BS present and normoactive. Soft, non-tender to  palpation. No organomegaly or masses detected.  MSK: No erythema, warmth or tenderness. Cap refil <2 sec all extrem. No deformities or joint swelling noted.  Neuro: A/Ox3. No focal deficits noted.   Skin: Warm, no lesions or rashe Psych: Normal affect and behavior. Judgement and thought content appropriate.     Lab Results:  CBC    Component Value Date/Time   WBC 7.2 09/25/2023 0833   WBC 7.1 09/12/2021 0340   RBC 2.70 (L) 09/25/2023 0833   HGB 10.2 (L) 09/25/2023 0833   HCT 31.0 (L) 09/25/2023 0833   PLT 334 09/25/2023 0833   MCV 114.8 (H) 09/25/2023 0833   MCH 37.8 (H) 09/25/2023 0833   MCHC 32.9 09/25/2023 0833   RDW 16.0 (H) 09/25/2023 0833   LYMPHSABS 0.9 09/25/2023 0833   MONOABS 1.4 (H) 09/25/2023 0833   EOSABS 0.1 09/25/2023 0833   BASOSABS 0.0 09/25/2023 0833    BMET    Component Value Date/Time   NA 138 09/25/2023 0833   K 3.6 09/25/2023 0833   CL 106 09/25/2023 0833   CO2 28 09/25/2023 0833   GLUCOSE 74 09/25/2023 0833   BUN 9 09/25/2023 0833   CREATININE 0.82 09/25/2023 0833   CALCIUM  8.8 (L) 09/25/2023 0833   GFRNONAA >60 09/25/2023 0833    BNP No results found for: "BNP"   Imaging:  CT CHEST ABDOMEN PELVIS W CONTRAST Result Date: 09/17/2023 CLINICAL DATA:  History of stage IV lung cancer, follow-up. * Tracking Code: BO * EXAM: CT CHEST, ABDOMEN, AND PELVIS WITH CONTRAST TECHNIQUE: Multidetector CT imaging of the chest, abdomen and pelvis was performed following the standard protocol during bolus administration of intravenous contrast. RADIATION DOSE REDUCTION: This exam was performed according to the departmental dose-optimization program which includes automated exposure control, adjustment of the mA and/or kV according to patient size and/or use of iterative reconstruction technique. CONTRAST:  OMNIPAQUE  IOHEXOL  300 MG/ML  SOLN COMPARISON:  Multiple priors including most recent CT June 26, 2023 FINDINGS: CT CHEST FINDINGS Cardiovascular:  Accessed right chest Port-A-Cath with tip in the right atrium. Normal size heart. No significant pericardial effusion/thickening. Mediastinum/Nodes: No suspicious thyroid  nodule. No pathologically enlarged mediastinal, hilar or axillary lymph nodes. Mild symmetric distal esophageal wall thickening. Lungs/Pleura: Similar soft tissue thickening about the right hilum with stranding into the middle mediastinum. Right perihilar problem loss with architectural distortion, interstitial thickening and airway thickening similar to prior examination including a nodular component along superior aspect measuring 2.2 x 1.4 cm on image 45/6 previously 2.2 x 1.5 cm. New 7 mm pulmonary nodule in the superior segment of the right lower lobe on image 39/6. Multifocal interstitial/interlobular septal thickening with multifocal patchy ground-glass opacities are similar prior examination. Increased  size of the moderate left with stable moderate right pleural effusion. Musculoskeletal: No aggressive lytic or blastic lesion of bone. Remote right-sided rib fractures. CT ABDOMEN PELVIS FINDINGS Hepatobiliary: No suspicious hepatic lesion. Gallbladder is unremarkable. No biliary ductal dilation. Pancreas: No pancreatic ductal dilation or evidence of acute inflammation. Spleen: No splenomegaly. Adrenals/Urinary Tract: No suspicious adrenal nodule/mass. Left lower pole renal cyst. No hydronephrosis. Kidneys demonstrate symmetric enhancement. Urinary bladder is unremarkable for degree of distension. Stomach/Bowel: Stomach is unremarkable for degree of distension. No pathologic dilation of small or large bowel. Sigmoid colonic diverticulosis. Vascular/Lymphatic: Aortic atherosclerosis. The portal, splenic and superior mesenteric veins are patent. No pathologically enlarged abdominal or pelvic lymph nodes. Reproductive: Status post hysterectomy. No adnexal masses. Other: Slightly increased small volume ascites. Similar subcutaneous edema.  Musculoskeletal: No aggressive lytic or blastic lesion of bone. Multilevel degenerative change of the spine. IMPRESSION: 1. New 7 mm pulmonary nodule in the superior segment of the right lower lobe, appearances more suggestive of an inflammatory/infectious process however it is nonspecific and warrants attention on short-term interval follow-up imaging. 2. Similar soft tissue thickening about the right hilum with stranding into the middle mediastinum. 3. Similar multifocal interstitial/interlobular septal thickening with multifocal patchy ground-glass opacities. 4. Increased size of the moderate left with stable moderate right pleural effusion. 5. Slightly increased small volume ascites with similar subcutaneous edema. 6. Mild symmetric distal esophageal wall thickening, which may reflect esophagitis Electronically Signed   By: Tama Fails M.D.   On: 09/17/2023 17:12    heparin  lock flush 100 unit/mL     Date Action Dose Route User   08/14/2023 1120 Given 500 Units Intracatheter Keene, Elizabeth C, RN      methylPREDNISolone  acetate (DEPO-MEDROL ) injection 80 mg     Date Action Dose Route User   09/30/2023 [phone] Given 80 mg Intramuscular (Left Ventrogluteal) Lonie Roa, CMA      pembrolizumab  (KEYTRUDA ) 200 mg in sodium chloride  0.9 % 50 mL chemo infusion     Date Action Dose Route User   08/14/2023 1021 Infusion Verify (none) Intravenous Nathanial Balboa, RN   08/14/2023 1021 Rate/Dose Change (none) Intravenous Nathanial Balboa, RN   08/14/2023 1021 New Bag/Given 200 mg Intravenous Nathanial Balboa, RN      pembrolizumab  (KEYTRUDA ) 200 mg in sodium chloride  0.9 % 50 mL chemo infusion     Date Action Dose Route User   09/04/2023 1158 Infusion Verify (none) Intravenous Lavona Pounds, RN   09/04/2023 1156 New Bag/Given 200 mg Intravenous Carle Chars M, RN      PEMEtrexed  Disodium (ALIMTA) 600 mg in sodium chloride  0.9 % 100 mL chemo infusion     Date Action Dose Route User    08/14/2023 1112 Rate/Dose Change (none) Intravenous Nathanial Balboa, RN   08/14/2023 1102 Rate/Dose Change (none) Intravenous Nathanial Balboa, RN   08/14/2023 1102 New Bag/Given 600 mg Intravenous Nathanial Balboa, RN      PEMEtrexed  Disodium (ALIMTA) 600 mg in sodium chloride  0.9 % 100 mL chemo infusion     Date Action Dose Route User   09/04/2023 1242 Infusion Verify (none) Intravenous Lavona Pounds, RN   09/04/2023 1241 New Bag/Given 600 mg Intravenous Lavona Pounds, RN      prochlorperazine  (COMPAZINE ) tablet 10 mg     Date Action Dose Route User   08/14/2023 0958 Given 10 mg Oral Nathanial Balboa, RN      prochlorperazine  (COMPAZINE ) tablet 10 mg  Date Action Dose Route User   09/04/2023 1139 Given 10 mg Oral Lavona Pounds, RN      0.9 %  sodium chloride  infusion     Date Action Dose Route User   08/14/2023 1120 Rate/Dose Change (none) Intravenous Nathanial Balboa, RN   08/14/2023 1116 Infusion Verify (none) Intravenous Nathanial Balboa, RN   08/14/2023 1114 Rate/Dose Change (none) Intravenous Nathanial Balboa, RN   08/14/2023 1102 Rate/Dose Change (none) Intravenous Nathanial Balboa, RN   08/14/2023 1059 Rate/Dose Change (none) Intravenous Nathanial Balboa, RN      0.9 %  sodium chloride  infusion     Date Action Dose Route User   09/04/2023 1257 Rate/Dose Change (none) Intravenous Lavona Pounds, RN   09/04/2023 1254 Rate/Dose Change (none) Intravenous Lavona Pounds, RN   09/04/2023 1240 Infusion Verify (none) Intravenous Lavona Pounds, RN   09/04/2023 1240 Rate/Dose Change (none) Intravenous Lavona Pounds, RN   09/04/2023 1237 Rate/Dose Change (none) Intravenous Lavona Pounds, RN      sodium chloride  flush (NS) 0.9 % injection 10 mL     Date Action Dose Route User   08/14/2023 0849 Given 10 mL Intracatheter Penni Bowman, LPN      sodium chloride  flush (NS) 0.9 % injection 10 mL     Date Action Dose Route User   08/14/2023 1120 Given 10 mL Intracatheter  Nathanial Balboa, RN      sodium chloride  flush (NS) 0.9 % injection 10 mL     Date Action Dose Route User   09/04/2023 [phone] Given 10 mL Intracatheter Cates, Porsche L, LPN      sodium chloride  flush (NS) 0.9 % injection 10 mL     Date Action Dose Route User   09/25/2023 [phone] Given 10 mL Intracatheter Olita Best T, LPN           No data to display          No results found for: "NITRICOXIDE"      Assessment & Plan:   Pleural effusion on left Increased moderate left pleural effusion. Suspect this is a driving factor of her progressive DOE. Awaiting thoracentesis in IR. She still has an effusion on the right, which oncology has deferred to IR regarding drainage. If she has recurrence, may need to consider PleurX catheter. Will continue to monitor.   Patient Instructions  Continue Breztri  2 puffs Twice daily. Brush tongue and rinse mouth afterwards Continue Albuterol  inhaler 2 puffs or 3 mL neb every 6 hours as needed for shortness of breath or wheezing. Notify if symptoms persist despite rescue inhaler/neb use. Use nebs 2-3 times a day   Guaifenesin 600 mg over the counter Twice daily for cough/congestion  Prednisone taper. 4 tabs for 2 days, then 3 tabs for 2 days, 2 tabs for 2 days, then 1 tab for 2 days, then stop. Take in AM with food. Start tomorrow  Attend thoracentesis as scheduled on 5/22. This should help with your breathing and cough.   Plan to repeat x ray when you come back   Pulmonary function testing ordered - next available at any site   Follow up in 2-3 weeks with Dr. Baldwin Levee or [name]. Ok to double book KC on day not already double booked. If symptoms do not improve or worsen, please contact office for sooner follow up or seek emergency care.    COPD with acute exacerbation (HCC) AECOPD with symptoms exacerbated by pleural  effusion. Will treat her with depo 80 mg inj x 1 and prednisone taper. Target mucociliary clearance and continue  bronchodilator regimen. Action plan in place. Close follow up. Strict ED precautions  Adenocarcinoma of right lung, stage 4 (HCC) Concurrent chemoradiation and Keytruda . Last scan was stable aside from increased effusion. See above. Will need to closely monitor off therapy. No evidence of progression of ground glass opacities at this time. Follow up with oncology as scheduled.    Advised if symptoms do not improve or worsen, to please contact office for sooner follow up or seek emergency care.   I spent 35 minutes of dedicated to the care of this patient on the date of this encounter to include pre-visit review of records, face-to-face time with the patient discussing conditions above, post visit ordering of testing, clinical documentation with the electronic health record, making appropriate referrals as documented, and communicating necessary findings to members of the patients care team.  Roetta Clarke, NP 09/30/2023  Pt aware and understands NP's role.

## 2023-09-30 NOTE — Assessment & Plan Note (Signed)
 Increased moderate left pleural effusion. Suspect this is a driving factor of her progressive DOE. Awaiting thoracentesis in IR. She still has an effusion on the right, which oncology has deferred to IR regarding drainage. If she has recurrence, may need to consider PleurX catheter. Will continue to monitor.   Patient Instructions  Continue Breztri  2 puffs Twice daily. Brush tongue and rinse mouth afterwards Continue Albuterol  inhaler 2 puffs or 3 mL neb every 6 hours as needed for shortness of breath or wheezing. Notify if symptoms persist despite rescue inhaler/neb use. Use nebs 2-3 times a day   Guaifenesin 600 mg over the counter Twice daily for cough/congestion  Prednisone taper. 4 tabs for 2 days, then 3 tabs for 2 days, 2 tabs for 2 days, then 1 tab for 2 days, then stop. Take in AM with food. Start tomorrow  Attend thoracentesis as scheduled on 5/22. This should help with your breathing and cough.   Plan to repeat x ray when you come back   Pulmonary function testing ordered - next available at any site   Follow up in 2-3 weeks with Dr. Baldwin Levee or [name]. Ok to double book [facility] on day not already double booked. If symptoms do not improve or worsen, please contact office for sooner follow up or seek emergency care.

## 2023-09-30 NOTE — Patient Instructions (Addendum)
 Continue Breztri  2 puffs Twice daily. Brush tongue and rinse mouth afterwards Continue Albuterol  inhaler 2 puffs or 3 mL neb every 6 hours as needed for shortness of breath or wheezing. Notify if symptoms persist despite rescue inhaler/neb use. Use nebs 2-3 times a day   Guaifenesin 600 mg over the counter Twice daily for cough/congestion  Prednisone taper. 4 tabs for 2 days, then 3 tabs for 2 days, 2 tabs for 2 days, then 1 tab for 2 days, then stop. Take in AM with food. Start tomorrow  Attend thoracentesis as scheduled on 5/22. This should help with your breathing and cough.   Plan to repeat x ray when you come back   Pulmonary function testing ordered - next available at any site   Follow up in 2-3 weeks with Dr. Baldwin Levee or [name]. Ok to double book [facility] on day not already double booked. If symptoms do not improve or worsen, please contact office for sooner follow up or seek emergency care.

## 2023-10-02 ENCOUNTER — Ambulatory Visit (HOSPITAL_COMMUNITY)
Admission: RE | Admit: 2023-10-02 | Discharge: 2023-10-02 | Disposition: A | Discharge status: Home / Self Care | Source: Ambulatory Visit | Attending: Physician Assistant | Admitting: Physician Assistant

## 2023-10-02 ENCOUNTER — Other Ambulatory Visit: Payer: Self-pay | Admitting: Physician Assistant

## 2023-10-02 VITALS — BP 126/83

## 2023-10-02 DIAGNOSIS — J90 Pleural effusion, not elsewhere classified: Secondary | ICD-10-CM | POA: Insufficient documentation

## 2023-10-02 DIAGNOSIS — C3490 Malignant neoplasm of unspecified part of unspecified bronchus or lung: Secondary | ICD-10-CM | POA: Diagnosis not present

## 2023-10-02 DIAGNOSIS — C3491 Malignant neoplasm of unspecified part of right bronchus or lung: Secondary | ICD-10-CM | POA: Insufficient documentation

## 2023-10-02 DIAGNOSIS — Z48813 Encounter for surgical aftercare following surgery on the respiratory system: Secondary | ICD-10-CM | POA: Diagnosis not present

## 2023-10-02 MED ORDER — LIDOCAINE HCL 1 % IJ SOLN
INTRAMUSCULAR | Status: AC
  Filled 2023-10-02: qty 20

## 2023-10-02 NOTE — Procedures (Signed)
 PROCEDURE SUMMARY:  Successful US  guided right thoracentesis. Yielded 500 mL of clear yellow fluid. Patient tolerated procedure well. No immediate complications. EBL = trace  Post procedure chest X-ray reveals no pneumothorax  Kadarious Dikes S Akira Perusse PA-C 10/02/2023 11:16 AM

## 2023-10-03 ENCOUNTER — Ambulatory Visit (HOSPITAL_COMMUNITY)
Admission: RE | Admit: 2023-10-03 | Discharge: 2023-10-03 | Disposition: A | Discharge status: Home / Self Care | Source: Ambulatory Visit | Attending: Urology | Admitting: Urology

## 2023-10-03 ENCOUNTER — Ambulatory Visit (HOSPITAL_COMMUNITY)
Admission: RE | Admit: 2023-10-03 | Discharge: 2023-10-03 | Disposition: A | Discharge status: Home / Self Care | Source: Ambulatory Visit | Attending: Physician Assistant | Admitting: Physician Assistant

## 2023-10-03 DIAGNOSIS — J90 Pleural effusion, not elsewhere classified: Secondary | ICD-10-CM | POA: Diagnosis not present

## 2023-10-03 DIAGNOSIS — Z48813 Encounter for surgical aftercare following surgery on the respiratory system: Secondary | ICD-10-CM | POA: Diagnosis not present

## 2023-10-03 MED ORDER — LIDOCAINE HCL 1 % IJ SOLN
INTRAMUSCULAR | Status: AC
  Filled 2023-10-03: qty 20

## 2023-10-03 NOTE — Procedures (Signed)
 PROCEDURE SUMMARY:  Successful image-guided left-sided therapeutic thoracentesis. Yielded 0.60 liters of clear, straw-colored pleural fluid. Patient tolerated procedure well. EBL: Zero No immediate complications.  Post procedure CXR shows no pneumothorax.  Please see imaging section of Epic for full dictation.  Gordy Lauber Aleeya Veitch PA-C 10/03/2023 3:52 PM

## 2023-10-13 ENCOUNTER — Other Ambulatory Visit: Payer: Self-pay

## 2023-10-13 DIAGNOSIS — J441 Chronic obstructive pulmonary disease with (acute) exacerbation: Secondary | ICD-10-CM

## 2023-10-14 ENCOUNTER — Ambulatory Visit: Admitting: Emergency Medicine

## 2023-10-14 DIAGNOSIS — J441 Chronic obstructive pulmonary disease with (acute) exacerbation: Secondary | ICD-10-CM | POA: Diagnosis not present

## 2023-10-14 LAB — PULMONARY FUNCTION TEST
DL/VA % pred: 83 %
DL/VA: 3.66 ml/min/mmHg/L
DLCO cor % pred: 40 %
DLCO cor: 7.13 ml/min/mmHg
DLCO unc % pred: 35 %
DLCO unc: 6.31 ml/min/mmHg
FEF 25-75 Post: 0.31 L/s
FEF 25-75 Pre: 0.29 L/s
FEF2575-%Change-Post: 4 %
FEF2575-%Pred-Post: 13 %
FEF2575-%Pred-Pre: 13 %
FEV1-%Change-Post: -2 %
FEV1-%Pred-Post: 32 %
FEV1-%Pred-Pre: 33 %
FEV1-Post: 0.73 L
FEV1-Pre: 0.74 L
FEV1FVC-%Change-Post: -3 %
FEV1FVC-%Pred-Pre: 73 %
FEV6-%Change-Post: 1 %
FEV6-%Pred-Post: 44 %
FEV6-%Pred-Pre: 44 %
FEV6-Post: 1.25 L
FEV6-Pre: 1.24 L
FEV6FVC-%Change-Post: 0 %
FEV6FVC-%Pred-Post: 100 %
FEV6FVC-%Pred-Pre: 99 %
FVC-%Change-Post: 1 %
FVC-%Pred-Post: 44 %
FVC-%Pred-Pre: 44 %
FVC-Post: 1.29 L
FVC-Pre: 1.28 L
Post FEV1/FVC ratio: 56 %
Post FEV6/FVC ratio: 97 %
Pre FEV1/FVC ratio: 58 %
Pre FEV6/FVC Ratio: 97 %
RV % pred: 118 %
RV: 2.07 L
TLC % pred: 77 %
TLC: 3.43 L

## 2023-10-14 NOTE — Patient Instructions (Signed)
 Full pft performed today.

## 2023-10-14 NOTE — Progress Notes (Signed)
 Full pft performed today.

## 2023-10-16 ENCOUNTER — Encounter: Admitting: Nutrition

## 2023-10-16 ENCOUNTER — Telehealth: Payer: Self-pay | Admitting: Neurology

## 2023-10-16 ENCOUNTER — Other Ambulatory Visit

## 2023-10-16 ENCOUNTER — Ambulatory Visit: Admitting: Internal Medicine

## 2023-10-16 ENCOUNTER — Ambulatory Visit

## 2023-10-16 NOTE — Telephone Encounter (Signed)
 Pt request new prescription apixaban  (ELIQUIS ) 2.5 MG TABS tablet. Informed patient due to medication prescribed by another physician we are not able to send a refill. You may call your PCP for refill.

## 2023-10-19 ENCOUNTER — Other Ambulatory Visit: Payer: Self-pay | Admitting: Physician Assistant

## 2023-10-19 DIAGNOSIS — C3491 Malignant neoplasm of unspecified part of right bronchus or lung: Secondary | ICD-10-CM

## 2023-10-22 ENCOUNTER — Telehealth: Payer: Self-pay

## 2023-10-22 NOTE — Telephone Encounter (Signed)
 Spoke with patient regarding a medication refill request for Remeron . Patient reported that she took one dose and did not tolerate it well, stating she does not wish to continue the medication. Cassie, PA was informed, and the medication was not refilled per patient's request. Patient voiced understanding.

## 2023-10-22 NOTE — Telephone Encounter (Signed)
 Patient did not tolerate medication. She does not want a refill.

## 2023-10-29 DIAGNOSIS — C3491 Malignant neoplasm of unspecified part of right bronchus or lung: Secondary | ICD-10-CM | POA: Diagnosis not present

## 2023-10-29 DIAGNOSIS — I69398 Other sequelae of cerebral infarction: Secondary | ICD-10-CM | POA: Diagnosis not present

## 2023-10-29 DIAGNOSIS — E440 Moderate protein-calorie malnutrition: Secondary | ICD-10-CM | POA: Diagnosis not present

## 2023-10-29 DIAGNOSIS — F419 Anxiety disorder, unspecified: Secondary | ICD-10-CM | POA: Diagnosis not present

## 2023-11-05 ENCOUNTER — Telehealth: Payer: Self-pay | Admitting: Medical Oncology

## 2023-11-05 NOTE — Telephone Encounter (Signed)
 Pt reported since her thoracentesis in May she said her stomach  feels like it is filling up with fluid. She also sound mildly SOB.   Tomorrow she has  a port flush only ( no labs) at 0815.Per Dr. Sherrod pt can see [facility] or move up scan.

## 2023-11-06 ENCOUNTER — Ambulatory Visit (HOSPITAL_BASED_OUTPATIENT_CLINIC_OR_DEPARTMENT_OTHER)
Admission: RE | Admit: 2023-11-06 | Discharge: 2023-11-06 | Disposition: A | Discharge status: Home / Self Care | Source: Ambulatory Visit | Attending: Physician Assistant | Admitting: Physician Assistant

## 2023-11-06 ENCOUNTER — Ambulatory Visit

## 2023-11-06 ENCOUNTER — Ambulatory Visit: Admitting: Physician Assistant

## 2023-11-06 ENCOUNTER — Other Ambulatory Visit

## 2023-11-06 ENCOUNTER — Inpatient Hospital Stay: Admitting: Physician Assistant

## 2023-11-06 ENCOUNTER — Encounter: Payer: Self-pay | Admitting: Internal Medicine

## 2023-11-06 ENCOUNTER — Inpatient Hospital Stay: Attending: Radiation Oncology

## 2023-11-06 VITALS — BP 128/84 | HR 101 | Temp 98.1°F | Resp 19

## 2023-11-06 VITALS — BP 118/81 | HR 102 | Temp 98.3°F | Resp 18 | Ht 60.0 in | Wt 109.9 lb

## 2023-11-06 DIAGNOSIS — T451X5D Adverse effect of antineoplastic and immunosuppressive drugs, subsequent encounter: Secondary | ICD-10-CM | POA: Diagnosis not present

## 2023-11-06 DIAGNOSIS — C7971 Secondary malignant neoplasm of right adrenal gland: Secondary | ICD-10-CM | POA: Diagnosis not present

## 2023-11-06 DIAGNOSIS — R0602 Shortness of breath: Secondary | ICD-10-CM | POA: Diagnosis not present

## 2023-11-06 DIAGNOSIS — C3491 Malignant neoplasm of unspecified part of right bronchus or lung: Secondary | ICD-10-CM | POA: Insufficient documentation

## 2023-11-06 DIAGNOSIS — Z85820 Personal history of malignant melanoma of skin: Secondary | ICD-10-CM | POA: Diagnosis not present

## 2023-11-06 DIAGNOSIS — R600 Localized edema: Secondary | ICD-10-CM

## 2023-11-06 DIAGNOSIS — D6481 Anemia due to antineoplastic chemotherapy: Secondary | ICD-10-CM | POA: Insufficient documentation

## 2023-11-06 DIAGNOSIS — Z87891 Personal history of nicotine dependence: Secondary | ICD-10-CM | POA: Insufficient documentation

## 2023-11-06 DIAGNOSIS — R140 Abdominal distension (gaseous): Secondary | ICD-10-CM

## 2023-11-06 DIAGNOSIS — Z8541 Personal history of malignant neoplasm of cervix uteri: Secondary | ICD-10-CM | POA: Diagnosis not present

## 2023-11-06 DIAGNOSIS — Z95828 Presence of other vascular implants and grafts: Secondary | ICD-10-CM

## 2023-11-06 DIAGNOSIS — C3490 Malignant neoplasm of unspecified part of unspecified bronchus or lung: Secondary | ICD-10-CM | POA: Diagnosis not present

## 2023-11-06 DIAGNOSIS — Z9049 Acquired absence of other specified parts of digestive tract: Secondary | ICD-10-CM | POA: Insufficient documentation

## 2023-11-06 DIAGNOSIS — J90 Pleural effusion, not elsewhere classified: Secondary | ICD-10-CM | POA: Insufficient documentation

## 2023-11-06 DIAGNOSIS — Z9851 Tubal ligation status: Secondary | ICD-10-CM | POA: Diagnosis not present

## 2023-11-06 DIAGNOSIS — C7972 Secondary malignant neoplasm of left adrenal gland: Secondary | ICD-10-CM | POA: Insufficient documentation

## 2023-11-06 DIAGNOSIS — R935 Abnormal findings on diagnostic imaging of other abdominal regions, including retroperitoneum: Secondary | ICD-10-CM | POA: Diagnosis not present

## 2023-11-06 DIAGNOSIS — R6881 Early satiety: Secondary | ICD-10-CM | POA: Insufficient documentation

## 2023-11-06 DIAGNOSIS — R188 Other ascites: Secondary | ICD-10-CM | POA: Insufficient documentation

## 2023-11-06 DIAGNOSIS — R062 Wheezing: Secondary | ICD-10-CM | POA: Insufficient documentation

## 2023-11-06 DIAGNOSIS — C539 Malignant neoplasm of cervix uteri, unspecified: Secondary | ICD-10-CM | POA: Diagnosis not present

## 2023-11-06 DIAGNOSIS — Z79899 Other long term (current) drug therapy: Secondary | ICD-10-CM | POA: Insufficient documentation

## 2023-11-06 DIAGNOSIS — J4489 Other specified chronic obstructive pulmonary disease: Secondary | ICD-10-CM | POA: Diagnosis not present

## 2023-11-06 LAB — CMP (CANCER CENTER ONLY)
ALT: 9 U/L (ref 0–44)
AST: 21 U/L (ref 15–41)
Albumin: 3 g/dL — ABNORMAL LOW (ref 3.5–5.0)
Alkaline Phosphatase: 71 U/L (ref 38–126)
Anion gap: 6 (ref 5–15)
BUN: 9 mg/dL (ref 6–20)
CO2: 26 mmol/L (ref 22–32)
Calcium: 8.6 mg/dL — ABNORMAL LOW (ref 8.9–10.3)
Chloride: 106 mmol/L (ref 98–111)
Creatinine: 0.77 mg/dL (ref 0.44–1.00)
GFR, Estimated: >60 mL/min (ref >60)
Glucose, Bld: 77 mg/dL (ref 70–99)
Potassium: 3.5 mmol/L (ref 3.5–5.1)
Sodium: 138 mmol/L (ref 135–145)
Total Bilirubin: 0.1 mg/dL (ref 0.0–1.2)
Total Protein: 6.3 g/dL — ABNORMAL LOW (ref 6.5–8.1)

## 2023-11-06 LAB — CBC WITH DIFFERENTIAL (CANCER CENTER ONLY)
Abs Immature Granulocytes: 0.02 10*3/uL (ref 0.00–0.07)
Basophils Absolute: 0 10*3/uL (ref 0.0–0.1)
Basophils Relative: 1 %
Eosinophils Absolute: 0.1 10*3/uL (ref 0.0–0.5)
Eosinophils Relative: 2 %
HCT: 36 % (ref 36.0–46.0)
Hemoglobin: 11.8 g/dL — ABNORMAL LOW (ref 12.0–15.0)
Immature Granulocytes: 0 %
Lymphocytes Relative: 26 %
Lymphs Abs: 1.3 10*3/uL (ref 0.7–4.0)
MCH: 33 pg (ref 26.0–34.0)
MCHC: 32.8 g/dL (ref 30.0–36.0)
MCV: 100.6 fL — ABNORMAL HIGH (ref 80.0–100.0)
Monocytes Absolute: 1 10*3/uL (ref 0.1–1.0)
Monocytes Relative: 20 %
Neutro Abs: 2.5 10*3/uL (ref 1.7–7.7)
Neutrophils Relative %: 51 %
Platelet Count: 276 10*3/uL (ref 150–400)
RBC: 3.58 MIL/uL — ABNORMAL LOW (ref 3.87–5.11)
RDW: 13 % (ref 11.5–15.5)
WBC Count: 5 10*3/uL (ref 4.0–10.5)
nRBC: 0 % (ref 0.0–0.2)

## 2023-11-06 LAB — SAMPLE TO BLOOD BANK

## 2023-11-06 MED ORDER — IOHEXOL 300 MG/ML  SOLN
100.0000 mL | Freq: Once | INTRAMUSCULAR | Status: AC | PRN
  Administered 2023-11-06: 80 mL via INTRAVENOUS

## 2023-11-06 MED ORDER — HEPARIN SOD (PORK) LOCK FLUSH 100 UNIT/ML IV SOLN
500.0000 [IU] | Freq: Once | INTRAVENOUS | Status: DC
Start: 2023-11-06 — End: 2023-11-06
  Administered 2023-11-06: 500 [IU]

## 2023-11-06 MED ORDER — SODIUM CHLORIDE 0.9% FLUSH
10.0000 mL | Freq: Once | INTRAVENOUS | Status: AC
  Administered 2023-11-06: 10 mL via INTRAVENOUS

## 2023-11-06 MED ORDER — SODIUM CHLORIDE 0.9% FLUSH
10.0000 mL | Freq: Once | INTRAVENOUS | Status: AC
  Administered 2023-11-06: 10 mL

## 2023-11-06 MED ORDER — HEPARIN SOD (PORK) LOCK FLUSH 100 UNIT/ML IV SOLN
500.0000 [IU] | Freq: Once | INTRAVENOUS | Status: AC
Start: 2023-11-06 — End: 2023-11-06
  Administered 2023-11-06: 500 [IU] via INTRAVENOUS

## 2023-11-06 NOTE — Progress Notes (Signed)
 Symptom Management Consult Note [facility]    Patient Care Team: [name], FNP as PCP - General (Family Medicine)    Name / MRN / DOBMaurene Walsh  [id number]  11/13/1965   Date of visit: 11/06/2023   Chief Complaint/Reason for visit: abdominal distention    Current Therapy: Alimta  and Keytruda . Treatment break starting 09/25/23   Last treatment:  Day 1   Cycle 35 on 09/04/23    ASSESSMENT AND PLAN Patient is a 58 y.o. female with oncologic history of stage IV (T2b, N2, M1 C) non-small cell lung cancer, adenocarcinoma followed by Dr. Sherrod.  I have viewed most recent oncology note and lab work.   #Abdominal distention and bilateral lower extremity edema - Chart review shows most recent CT CAP was 09/17/23 and showed overall stable disease, increased size of moderate left pleural effusion which she had drained, and slightly increased small volume ascites with similar subcutaneous edema. - Weight today is the same as last visit on 09/30/23. On exam patient has distended abdomen, no tenderness, periumbilical tympany with dullness in the flanks. 1+ pitting edema in BLE.  - CMP overall unremarkable. CBC shows anemia is improving. - Patient has CT CAP already ordered by medical oncologist. Will change imaging to STAT instead of routine. Symptoms have been present times x 3 weeks, she is stable for outpatient work up.  - Patient prefers to try compression socks and hold off on diuretic until scan results.  #Stage IV (T2b, N2, M1 C) non-small cell lung cancer, adenocarcinoma  - Patient will follow up with oncologist on 11/11/23 to discuss CT scan results.  #COPD - Wheezing on exam. No respiratory distress or hypoxia. - Discussed home medications and patient has not been using nebulizer. Encouraged her to use it q6 hours until wheezing improves.  - Patient has pulmonology appointment scheduled for 12/19/23. Advised patient to call office sooner if  symptoms do not improve.  Strict ED precautions discussed should symptoms worsen.   HEME/ONC HISTORY Oncology History  Adenocarcinoma of right lung, stage 4 (HCC)  08/21/2021 Initial Diagnosis   Adenocarcinoma of right lung, stage 4 (HCC)   08/21/2021 Cancer Staging   Staging form: Lung, AJCC 8th Edition - Clinical: Stage IVB (cT2b, cN2, cM1c) - Signed by Sherrod Sherrod, MD on 08/21/2021    Genetic Testing   [facility] CancerNext-Expanded Panel was Negative. Report date is 09/04/2021.  The CancerNext-Expanded gene panel offered by [facility] and includes sequencing, rearrangement, and RNA analysis for the following 77 genes: AIP, ALK, APC, ATM, AXIN2, BAP1, BARD1, BLM, BMPR1A, BRCA1, BRCA2, BRIP1, CDC73, CDH1, CDK4, CDKN1B, CDKN2A, CHEK2, CTNNA1, DICER1, FANCC, FH, FLCN, GALNT12, KIF1B, LZTR1, MAX, MEN1, MET, MLH1, MSH2, MSH3, MSH6, MUTYH, NBN, NF1, NF2, NTHL1, PALB2, PHOX2B, PMS2, POT1, PRKAR1A, PTCH1, PTEN, RAD51C, RAD51D, RB1, RECQL, RET, SDHA, SDHAF2, SDHB, SDHC, SDHD, SMAD4, SMARCA4, SMARCB1, SMARCE1, STK11, SUFU, TMEM127, TP53, TSC1, TSC2, VHL and XRCC2 (sequencing and deletion/duplication); EGFR, EGLN1, HOXB13, KIT, MITF, PDGFRA, POLD1, and POLE (sequencing only); EPCAM and GREM1 (deletion/duplication only).    08/29/2021 - 09/04/2023 Chemotherapy   Patient is on Treatment Plan : LUNG Carboplatin  (5) + Pemetrexed  (500) + Pembrolizumab  (200) D1 q21d Induction x 4 cycles / Maintenance Pemetrexed  (500) + Pembrolizumab  (200) D1 q21d         INTERVAL HISTORY  Discussed the use of AI scribe software for clinical note transcription with the patient, who gave verbal consent to proceed.    April Walsh is  a 58 y.o. female with oncologic history as above presenting to [facility] today with chief complaint of abdominal distention. Patient presents unaccompanied to visit today.  She has experienced progressive abdominal distension over the past two weeks, suspecting fluid accumulation. Her abdomen  has become noticeably larger and her clothes are now tight fitting.  She experiences early satiety. This sensation has been present for approximately two weeks. No abdominal pain is reported, but there is a persistent bloated feeling that does not fluctuate throughout the day. Her bowel movements are normal, happening daily with a soft consistency. Denies constipation.  Her first paracentesis was in 02/2023 and the most recent in late 09/2023. Her shortness of breath drastically improved after thoracentesis. However, there is a return of mild shortness of breath, though not as severe as before the thoracentesis.   She endorses bilateral leg swelling since October 2024 as well. Denies any changes in swelling recently. When she wears compression socks the swelling improves. She has not worn them lately because it has been so hot out. The swelling is more pronounced during the day and decreases at night.   She has a history of COPD and uses Breztri  twice daily, an albuterol  rescue inhaler as needed, and a nebulizer approximately once a week. Increased wheezing over the past two weeks is described, likening herself to a 'musical instrument.' She has not been using oxygen therapy. Her shortness of breath and wheezing have worsened recently, possibly exacerbated by the heat. She denied being in distress currently.     ROS  All other systems are reviewed and are negative for acute change except as noted in the HPI.    Allergies  Allergen Reactions   Remeron  [Mirtazapine ] Other (See Comments)    States she slept for 5 days     Past Medical History:  Diagnosis Date   Anemia    Asthma    Basal cell carcinoma    Bronchitis    Cervical cancer (HCC)    COPD (chronic obstructive pulmonary disease) (HCC)    early stages of COPD   Headache    HLD (hyperlipidemia)    Lung cancer (HCC) 08/13/2021     Past Surgical History:  Procedure Laterality Date   APPENDECTOMY     1996   BREAST BIOPSY  Right    2010-2015   BRONCHIAL BIOPSY  08/13/2021   Procedure: BRONCHIAL BIOPSIES;  Surgeon: Shelah Lamar RAMAN, MD;  Location: [facility] ENDOSCOPY;  Service: Pulmonary;;   BRONCHIAL BRUSHINGS  08/13/2021   Procedure: BRONCHIAL BRUSHINGS;  Surgeon: Shelah Lamar RAMAN, MD;  Location: [facility] ENDOSCOPY;  Service: Pulmonary;;   BRONCHIAL NEEDLE ASPIRATION BIOPSY  08/13/2021   Procedure: BRONCHIAL NEEDLE ASPIRATION BIOPSIES;  Surgeon: Shelah Lamar RAMAN, MD;  Location: [facility] ENDOSCOPY;  Service: Pulmonary;;   CESAREAN SECTION     1984, 1987   FOOT SURGERY Right    2 pins and screw 1999   FOOT SURGERY Left 06/21/2021   screw and 2 pins   HEMOSTASIS CONTROL  08/13/2021   Procedure: HEMOSTASIS CONTROL;  Surgeon: Shelah Lamar RAMAN, MD;  Location: [facility] ENDOSCOPY;  Service: Pulmonary;;   IR IMAGING GUIDED PORT INSERTION  11/26/2021   MELANOMA EXCISION Left    basal cell melanoma 2011   PARTIAL HYSTERECTOMY     1994   TUBAL LIGATION     1987   VIDEO BRONCHOSCOPY  08/13/2021   Procedure: VIDEO BRONCHOSCOPY WITHOUT FLUORO;  Surgeon: Shelah Lamar RAMAN, MD;  Location: [facility] ENDOSCOPY;  Service: Pulmonary;;  VIDEO BRONCHOSCOPY WITH ENDOBRONCHIAL ULTRASOUND N/A 08/13/2021   Procedure: VIDEO BRONCHOSCOPY WITH ENDOBRONCHIAL ULTRASOUND;  Surgeon: Shelah Lamar RAMAN, MD;  Location: [facility] ENDOSCOPY;  Service: Pulmonary;  Laterality: N/A;    Social History   Socioeconomic History   Marital status: Married    Spouse name: Not on file   Number of children: 2   Years of education: Not on file   Highest education level: Not on file  Occupational History   Occupation: retired  Tobacco Use   Smoking status: Former    Current packs/day: 0.00    Average packs/day: 0.5 packs/day for 32.0 years (16.0 ttl pk-yrs)    Types: Cigarettes    Quit date: 08/11/2021    Years since quitting: 2.2    Passive exposure: Past   Smokeless tobacco: Never   Tobacco comments:    1/2 pack smoked daily ARJ, RN 08/02/21  Vaping Use   Vaping status: Never Used  Substance  and Sexual Activity   Alcohol use: Never   Drug use: Never   Sexual activity: Yes    Birth control/protection: Surgical, Post-menopausal  Other Topics Concern   Not on file  Social History Narrative   Not on file   Social Drivers of Health   Financial Resource Strain: Not on file  Food Insecurity: Low Risk  (10/28/2023)   Received from [facility]   Hunger Vital Sign    Within the past 12 months, you worried that your food would run out before you got money to buy more: Never true    Within the past 12 months, the food you bought just didn't last and you didn't have money to get more. : Never true  Transportation Needs: No Transportation Needs (10/28/2023)   Received from [employer]    In the past 12 months, has lack of reliable transportation kept you from medical appointments, meetings, work or from getting things needed for daily living? : No  Physical Activity: Not on file  Stress: Not on file  Social Connections: Not on file  Intimate Partner Violence: Not on file    Family History  Problem Relation Age of Onset   Diabetes Mother    Heart failure Mother    Hypertension Mother    Hyperlipidemia Mother    Brain cancer Maternal Aunt    Throat cancer Maternal Aunt    Lung cancer Paternal Uncle        he smoked   Lung cancer Paternal Uncle        he smoked   Ovarian cancer Maternal Grandmother    Seizures Daughter    Bronchitis Son    Lung cancer Cousin    Colon cancer Neg Hx    Colon polyps Neg Hx    Esophageal cancer Neg Hx    Rectal cancer Neg Hx    Stomach cancer Neg Hx      Current Outpatient Medications:    albuterol  (ACCUNEB ) 0.63 MG/3ML nebulizer solution, Take 3 mLs (0.63 mg total) by nebulization every 4 (four) hours as needed for wheezing or shortness of breath., Disp: 75 mL, Rfl: 11   albuterol  (VENTOLIN  HFA) 108 (90 Base) MCG/ACT inhaler, Inhale 2 puffs into the lungs every 6 (six) hours as needed for wheezing or shortness of  breath., Disp: 8 g, Rfl: 8   apixaban  (ELIQUIS ) 2.5 MG TABS tablet, Take 1 tablet (2.5 mg total) by mouth 2 (two) times daily., Disp: 60 tablet, Rfl: 0   atorvastatin  (LIPITOR) 80 MG  tablet, TAKE 1 TABLET BY MOUTH EVERY DAY (Patient not taking: Reported on 09/30/2023), Disp: 30 tablet, Rfl: 3   Budeson-Glycopyrrol-Formoterol  (BREZTRI  AEROSPHERE) 160-9-4.8 MCG/ACT AERO, Inhale 2 puffs into the lungs in the morning and at bedtime., Disp: 10.7 each, Rfl: 10   folic acid  (FOLVITE ) 1 MG tablet, TAKE 1 TABLET BY MOUTH EVERY DAY, Disp: 30 tablet, Rfl: 2   lidocaine -prilocaine  (EMLA ) cream, APPLY TOPICALLY 1 APPLICATION AS NEEDED, Disp: 30 g, Rfl: 2   Na Sulfate-K Sulfate-Mg Sulfate concentrate (SUPREP) 17.5-3.13-1.6 GM/177ML SOLN, TAKE 1 KIT (354 MLS TOTAL) BY MOUTH ONCE FOR 1 DOSE., Disp: , Rfl:    pantoprazole  (PROTONIX ) 40 MG tablet, Take 1 tablet (40 mg total) by mouth 2 (two) times daily. Twice day for 6 weeks then 40 mg daily., Disp: 180 tablet, Rfl: 3   predniSONE  (DELTASONE ) 10 MG tablet, 4 tabs for 2 days, then 3 tabs for 2 days, 2 tabs for 2 days, then 1 tab for 2 days, then stop, Disp: 20 tablet, Rfl: 0   prochlorperazine  (COMPAZINE ) 10 MG tablet, Take 1 tablet (10 mg total) by mouth every 6 (six) hours as needed., Disp: 30 tablet, Rfl: 2   sucralfate  (CARAFATE ) 1 g tablet, Crush 1 tablet and dissolve in 10ml of warm water, mix well to create slurry and drink every 4 times daily for 4 weeks., Disp: 120 tablet, Rfl: 1 No current facility-administered medications for this visit.  Facility-Administered Medications Ordered in Other Visits:    cyanocobalamin  (VITAMIN B12) 1000 MCG/ML injection, , , ,    prochlorperazine  (COMPAZINE ) 10 MG tablet, , , ,   PHYSICAL EXAM ECOG FS:1 - Symptomatic but completely ambulatory    Vitals:   11/06/23 0854  BP: 118/81  Pulse: (!) 102  Resp: 18  Temp: 98.3 F (36.8 C)  TempSrc: Temporal  SpO2: 97%  Weight: 109 lb 14.4 oz (49.9 kg)  Height: 5' (1.524 m)    Physical Exam Vitals and nursing note reviewed.  Constitutional:      Appearance: She is not ill-appearing or toxic-appearing.  HENT:     Head: Normocephalic.   Eyes:     Conjunctiva/sclera: Conjunctivae normal.    Cardiovascular:     Rate and Rhythm: Regular rhythm. Tachycardia present.     Pulses: Normal pulses.     Heart sounds: Normal heart sounds.  Pulmonary:     Effort: Pulmonary effort is normal. No respiratory distress.     Breath sounds: No stridor. Wheezing (diffuse faint expiratory wheeze) present.  Abdominal:     General: Bowel sounds are normal. There is distension.     Tenderness: There is no abdominal tenderness. There is no guarding.     Comments: Periumbilical tympany with dullness in the flanks   Musculoskeletal:     Cervical back: Normal range of motion.     Right lower leg: 1+ Pitting Edema present.     Left lower leg: 1+ Pitting Edema present.   Skin:    General: Skin is warm and dry.   Neurological:     Mental Status: She is alert.        LABORATORY DATA I have reviewed the data as listed    Latest Ref Rng & Units 11/06/2023    8:20 AM 09/25/2023    8:33 AM 09/04/2023    9:37 AM  CBC  WBC 4.0 - 10.5 K/uL 5.0  7.2  7.0   Hemoglobin 12.0 - 15.0 g/dL 88.1  89.7  8.8   Hematocrit 36.0 -  46.0 % 36.0  31.0  26.9   Platelets 150 - 400 K/uL 276  334  333         Latest Ref Rng & Units 11/06/2023    8:20 AM 09/25/2023    8:33 AM 09/04/2023    9:37 AM  CMP  Glucose 70 - 99 mg/dL 77  74  899   BUN 6 - 20 mg/dL 9  9  12    Creatinine 0.44 - 1.00 mg/dL 9.22  9.17  9.30   Sodium 135 - 145 mmol/L 138  138  136   Potassium 3.5 - 5.1 mmol/L 3.5  3.6  3.9   Chloride 98 - 111 mmol/L 106  106  104   CO2 22 - 32 mmol/L 26  28  28    Calcium  8.9 - 10.3 mg/dL 8.6  8.8  8.6   Total Protein 6.5 - 8.1 g/dL 6.3  6.4  6.3   Total Bilirubin 0.0 - 1.2 mg/dL 0.1  0.2  0.2   Alkaline Phos 38 - 126 U/L 71  75  84   AST 15 - 41 U/L 21  22  26    ALT 0 - 44 U/L 9   10  14         RADIOGRAPHIC STUDIES (from last 24 hours if applicable) I have personally reviewed the radiological images as listed and agreed with the findings in the report. No results found.      Visit Diagnosis: 1. Abdominal distension   2. Adenocarcinoma of right lung, stage 4 (HCC)   3. Bilateral lower extremity edema      Orders Placed This Encounter  Procedures   CT CHEST ABDOMEN PELVIS W CONTRAST    Standing Status:   Future    Expected Date:   02/06/2024    Expiration Date:   09/24/2024    If indicated for the ordered procedure, I authorize the administration of contrast media per Radiology protocol:   Yes    Does the patient have a contrast media/X-ray dye allergy?:   Yes    Preferred imaging location?:   [facility]    If indicated for the ordered procedure, I authorize the administration of oral contrast media per Radiology protocol:   Yes    All questions were answered. The patient knows to call the clinic with any problems, questions or concerns. No barriers to learning was detected.  A total of more than 30 minutes were spent on this encounter with face-to-face time and non-face-to-face time, including preparing to see the patient, ordering tests and/or medications, counseling the patient and coordination of care as outlined above.    Thank you for allowing me to participate in the care of this patient.    Sundai Probert E  Walisiewicz, PA-C Department of Hematology/Oncology [facility] at [facility] Phone: [phone]  [phone]    11/06/2023 11:36 AM

## 2023-11-06 NOTE — Telephone Encounter (Signed)
 Pt confirmed appts for tonight and next week.

## 2023-11-06 NOTE — Addendum Note (Signed)
 Addended by: TANDA SAILORS E on: 11/06/2023 10:24 AM   Modules accepted: Orders

## 2023-11-06 NOTE — Progress Notes (Signed)
 This RN re-accessed Pt's PAC with PowerPort kit for STAT CT scan today at 5 PM. An antimicrobial disc and home dressing were placed at [facility] site and dressing was reinforced prior to Pt's discharge. Pt was agreeable to remain accessed until after scan today and Pt verablized understanding to notify staff to deaccess [facility] after CT scan.

## 2023-11-07 ENCOUNTER — Ambulatory Visit (HOSPITAL_COMMUNITY)
Admission: RE | Admit: 2023-11-07 | Discharge: 2023-11-07 | Disposition: A | Discharge status: Home / Self Care | Source: Ambulatory Visit | Attending: Physician Assistant

## 2023-11-07 ENCOUNTER — Telehealth: Payer: Self-pay | Admitting: Physician Assistant

## 2023-11-07 DIAGNOSIS — C3491 Malignant neoplasm of unspecified part of right bronchus or lung: Secondary | ICD-10-CM

## 2023-11-07 DIAGNOSIS — R140 Abdominal distension (gaseous): Secondary | ICD-10-CM | POA: Insufficient documentation

## 2023-11-07 DIAGNOSIS — R188 Other ascites: Secondary | ICD-10-CM | POA: Diagnosis not present

## 2023-11-07 LAB — LACTATE DEHYDROGENASE, PLEURAL OR PERITONEAL FLUID: LD, Fluid: 75 U/L — ABNORMAL HIGH (ref 3–23)

## 2023-11-07 MED ORDER — LIDOCAINE HCL 1 % IJ SOLN
INTRAMUSCULAR | Status: AC
  Filled 2023-11-07: qty 20

## 2023-11-07 NOTE — Procedures (Signed)
 PROCEDURE SUMMARY:  Successful image-guided paracentesis from the right abdomen.  Yielded 2.67 liters of clear, straw-colored peritoneal fluid.  No immediate complications.  EBL: zero Patient tolerated well.   Specimen was sent for labs.  Please see imaging section of Epic for full dictation.  Carlin LABOR Weslee Prestage PA-C 11/07/2023 2:01 PM

## 2023-11-07 NOTE — Telephone Encounter (Signed)
 I notified April Walsh by phone regarding CT CAP results. Patient was evaluated in yesterday for abdominal distention.The scan does a large volume ascites. Patient agreeable for paracentesis which was able to be scheduled for later today. Patient will discuss scan results in detail with Dr. Sherrod at her upcoming appointment 07/011/25. All of patient's questions were answered and she expressed understanding of the plan provided.

## 2023-11-11 ENCOUNTER — Inpatient Hospital Stay: Attending: Radiation Oncology | Admitting: Internal Medicine

## 2023-11-11 VITALS — BP 107/65 | HR 110 | Temp 98.4°F | Resp 18 | Ht 60.0 in | Wt 106.7 lb

## 2023-11-11 DIAGNOSIS — R0609 Other forms of dyspnea: Secondary | ICD-10-CM | POA: Insufficient documentation

## 2023-11-11 DIAGNOSIS — J90 Pleural effusion, not elsewhere classified: Secondary | ICD-10-CM | POA: Insufficient documentation

## 2023-11-11 DIAGNOSIS — R0602 Shortness of breath: Secondary | ICD-10-CM | POA: Insufficient documentation

## 2023-11-11 DIAGNOSIS — R188 Other ascites: Secondary | ICD-10-CM | POA: Insufficient documentation

## 2023-11-11 DIAGNOSIS — R062 Wheezing: Secondary | ICD-10-CM | POA: Diagnosis not present

## 2023-11-11 DIAGNOSIS — R5383 Other fatigue: Secondary | ICD-10-CM | POA: Insufficient documentation

## 2023-11-11 DIAGNOSIS — C7972 Secondary malignant neoplasm of left adrenal gland: Secondary | ICD-10-CM | POA: Diagnosis not present

## 2023-11-11 DIAGNOSIS — C3490 Malignant neoplasm of unspecified part of unspecified bronchus or lung: Secondary | ICD-10-CM

## 2023-11-11 DIAGNOSIS — C7971 Secondary malignant neoplasm of right adrenal gland: Secondary | ICD-10-CM | POA: Diagnosis not present

## 2023-11-11 DIAGNOSIS — C3491 Malignant neoplasm of unspecified part of right bronchus or lung: Secondary | ICD-10-CM | POA: Insufficient documentation

## 2023-11-11 DIAGNOSIS — R140 Abdominal distension (gaseous): Secondary | ICD-10-CM | POA: Insufficient documentation

## 2023-11-11 NOTE — Progress Notes (Signed)
 [facility] [phone]   [phone]  OFFICE PROGRESS NOTE  April Elberta Gainer, FNP [address] [place] [address]  DIAGNOSIS: Stage IV (T2b, N2, M1 C) non-small cell lung cancer, adenocarcinoma presented with right hilar mass in addition to mediastinal and upper abdominal lymphadenopathy in addition to bilateral adrenal metastases and metastatic disease in the musculature of the lower back.  This was diagnosed in April 2023.  DETECTED ALTERATION(S) / BIOMARKER(S) % CFDNA OR AMPLIFICATION ASSOCIATED FDA-APPROVED THERAPIES CLINICAL TRIAL AVAILABILITY [id number] 0.5% None  Yes [id number] (Splice Site Indel) 0.6% None  Yes  PD-L1 expression 0%  PRIOR THERAPY:  1) Palliative radiotherapy to the right hilar region under the care of Dr. Dewey. 2) systemic chemotherapy with carboplatin  for AUC of 5, Alimta  500 Mg/M2 and Keytruda  200 Mg IV every 3 weeks.  First dose August 29, 2021.  Status post 34  cycles.  Starting from cycle #5 the patient is on maintenance treatment with Alimta  and Keytruda  every 3 weeks.  CURRENT THERAPY: Observation.  INTERVAL HISTORY: April Walsh 58 y.o. female returns to the clinic today for follow-up visit.Discussed the use of AI scribe software for clinical note transcription with the patient, who gave verbal consent to proceed.  History of Present Illness   April Walsh is a 58 year old female with stage four non-small cell lung cancer who presents with abdominal distension and shortness of breath.  Diagnosed with stage four non-small cell lung cancer in April 2023, her cancer lacks actionable mutations and has negative PD-L1 expression. She has been undergoing chemoimmunotherapy with carboplatin , Alimta , and Keytruda , followed by maintenance therapy with Alimta  and Keytruda  every three weeks.  Recently, she developed significant abdominal distension, describing it as feeling like she was 'three  months pregnant.' Imaging of the chest, abdomen, and pelvis revealed a substantial amount of ascites, and 2.6 liters of fluid were drained. Pathology results of the fluid are pending to determine if it is malignant.  She also experienced shortness of breath, which has improved since the abdominal fluid was drained. No nausea, vomiting, diarrhea, or recent weight loss, although she attributes a slight weight change to the fluid accumulation.         MEDICAL HISTORY: Past Medical History:  Diagnosis Date   Anemia    Asthma    Basal cell carcinoma    Bronchitis    Cervical cancer (HCC)    COPD (chronic obstructive pulmonary disease) (HCC)    early stages of COPD   Headache    HLD (hyperlipidemia)    Lung cancer (HCC) 08/13/2021    ALLERGIES:  is allergic to remeron  [mirtazapine ].  MEDICATIONS:  Current Outpatient Medications  Medication Sig Dispense Refill   albuterol  (ACCUNEB ) 0.63 MG/3ML nebulizer solution Take 3 mLs (0.63 mg total) by nebulization every 4 (four) hours as needed for wheezing or shortness of breath. 75 mL 11   albuterol  (VENTOLIN  HFA) 108 (90 Base) MCG/ACT inhaler Inhale 2 puffs into the lungs every 6 (six) hours as needed for wheezing or shortness of breath. 8 g 8   apixaban  (ELIQUIS ) 2.5 MG TABS tablet Take 1 tablet (2.5 mg total) by mouth 2 (two) times daily. 60 tablet 0   atorvastatin  (LIPITOR) 80 MG tablet TAKE 1 TABLET BY MOUTH EVERY DAY (Patient not taking: Reported on 09/30/2023) 30 tablet 3   Budeson-Glycopyrrol-Formoterol  (BREZTRI  AEROSPHERE) 160-9-4.8 MCG/ACT AERO Inhale 2 puffs into the lungs in the morning and at bedtime. 10.7 each  10   folic acid  (FOLVITE ) 1 MG tablet TAKE 1 TABLET BY MOUTH EVERY DAY 30 tablet 2   lidocaine -prilocaine  (EMLA ) cream APPLY TOPICALLY 1 APPLICATION AS NEEDED 30 g 2   Na Sulfate-K Sulfate-Mg Sulfate concentrate (SUPREP) 17.5-3.13-1.6 GM/177ML SOLN TAKE 1 KIT (354 MLS TOTAL) BY MOUTH ONCE FOR 1 DOSE.     pantoprazole   (PROTONIX ) 40 MG tablet Take 1 tablet (40 mg total) by mouth 2 (two) times daily. Twice day for 6 weeks then 40 mg daily. 180 tablet 3   predniSONE  (DELTASONE ) 10 MG tablet 4 tabs for 2 days, then 3 tabs for 2 days, 2 tabs for 2 days, then 1 tab for 2 days, then stop 20 tablet 0   prochlorperazine  (COMPAZINE ) 10 MG tablet Take 1 tablet (10 mg total) by mouth every 6 (six) hours as needed. 30 tablet 2   sucralfate  (CARAFATE ) 1 g tablet Crush 1 tablet and dissolve in 10ml of warm water, mix well to create slurry and drink every 4 times daily for 4 weeks. 120 tablet 1   No current facility-administered medications for this visit.   Facility-Administered Medications Ordered in Other Visits  Medication Dose Route Frequency Provider Last Rate Last Admin   cyanocobalamin  (VITAMIN B12) 1000 MCG/ML injection            prochlorperazine  (COMPAZINE ) 10 MG tablet             SURGICAL HISTORY:  Past Surgical History:  Procedure Laterality Date   APPENDECTOMY     1996   BREAST BIOPSY Right    2010-2015   BRONCHIAL BIOPSY  08/13/2021   Procedure: BRONCHIAL BIOPSIES;  Surgeon: Shelah Lamar RAMAN, MD;  Location: [facility] ENDOSCOPY;  Service: Pulmonary;;   BRONCHIAL BRUSHINGS  08/13/2021   Procedure: BRONCHIAL BRUSHINGS;  Surgeon: Shelah Lamar RAMAN, MD;  Location: [facility] ENDOSCOPY;  Service: Pulmonary;;   BRONCHIAL NEEDLE ASPIRATION BIOPSY  08/13/2021   Procedure: BRONCHIAL NEEDLE ASPIRATION BIOPSIES;  Surgeon: Shelah Lamar RAMAN, MD;  Location: [facility] ENDOSCOPY;  Service: Pulmonary;;   CESAREAN SECTION     1984, 1987   FOOT SURGERY Right    2 pins and screw 1999   FOOT SURGERY Left 06/21/2021   screw and 2 pins   HEMOSTASIS CONTROL  08/13/2021   Procedure: HEMOSTASIS CONTROL;  Surgeon: Shelah Lamar RAMAN, MD;  Location: [facility] ENDOSCOPY;  Service: Pulmonary;;   IR IMAGING GUIDED PORT INSERTION  11/26/2021   MELANOMA EXCISION Left    basal cell melanoma 2011   PARTIAL HYSTERECTOMY     1994   TUBAL LIGATION     1987   VIDEO  BRONCHOSCOPY  08/13/2021   Procedure: VIDEO BRONCHOSCOPY WITHOUT FLUORO;  Surgeon: Shelah Lamar RAMAN, MD;  Location[facility];  Service: Pulmonary;;   VIDEO BRONCHOSCOPY WITH ENDOBRONCHIAL ULTRASOUND N/A 08/13/2021   Procedure: VIDEO BRONCHOSCOPY WITH ENDOBRONCHIAL ULTRASOUND;  Surgeon: Shelah Lamar RAMAN, MD;  Location: [facility] ENDOSCOPY;  Service: Pulmonary;  Laterality: N/A;    REVIEW OF SYSTEMS:  Constitutional: positive for fatigue Eyes: negative Ears, nose, mouth, throat, and face: negative Respiratory: positive for dyspnea on exertion and wheezing Cardiovascular: negative Gastrointestinal: negative Genitourinary:negative Integument/breast: negative Hematologic/lymphatic: negative Musculoskeletal:negative Neurological: negative Behavioral/Psych: negative Endocrine: negative Allergic/Immunologic: negative   PHYSICAL EXAMINATION: General appearance: alert, cooperative, fatigued, and no distress Head: Normocephalic, without obvious abnormality, atraumatic Neck: no adenopathy, no JVD, supple, symmetrical, trachea midline, and thyroid  not enlarged, symmetric, no tenderness/mass/nodules Lymph nodes: Cervical, supraclavicular, and axillary nodes normal. Resp: wheezes bilaterally Back: symmetric, no  curvature. ROM normal. No CVA tenderness. Cardio: regular rate and rhythm, S1, S2 normal, no murmur, click, rub or gallop GI: soft, non-tender; bowel sounds normal; no masses,  no organomegaly Extremities: extremities normal, atraumatic, no cyanosis or edema Neurologic: Alert and oriented X 3, normal strength and tone. Normal symmetric reflexes. Normal coordination and gait  ECOG PERFORMANCE STATUS: 1 - Symptomatic but completely ambulatory  Blood pressure 107/65, pulse (!) 110, temperature 98.4 F (36.9 C), temperature source Temporal, resp. rate 18, height 5' (1.524 m), weight 106 lb 11.2 oz (48.4 kg), SpO2 99%.  LABORATORY DATA: Lab Results  Component Value Date   WBC 5.0 11/06/2023   HGB  11.8 (L) 11/06/2023   HCT 36.0 11/06/2023   MCV 100.6 (H) 11/06/2023   PLT 276 11/06/2023      Chemistry      Component Value Date/Time   NA 138 11/06/2023 0820   K 3.5 11/06/2023 0820   CL 106 11/06/2023 0820   CO2 26 11/06/2023 0820   BUN 9 11/06/2023 0820   CREATININE 0.77 11/06/2023 0820      Component Value Date/Time   CALCIUM  8.6 (L) 11/06/2023 0820   ALKPHOS 71 11/06/2023 0820   AST 21 11/06/2023 0820   ALT 9 11/06/2023 0820   BILITOT 0.1 11/06/2023 0820       RADIOGRAPHIC STUDIES: US  Paracentesis Result Date: 11/07/2023 INDICATION: 58 year old female with history of non-small cell cancer, now with ascites. IR was requested for diagnostic and therapeutic paracentesis. EXAM: ULTRASOUND GUIDED DIAGNOSTIC THERAPEUTIC PARACENTESIS MEDICATIONS: CT chest abdomen pelvis with contrast on 11/06/23 COMPLICATIONS: None immediate. PROCEDURE: Informed written consent was obtained from the patient after a discussion of the risks, benefits and alternatives to treatment. A timeout was performed prior to the initiation of the procedure. Initial ultrasound scanning demonstrates a moderate amount of ascites within the right lower abdominal quadrant. The right lower abdomen was prepped and draped in the usual sterile fashion. 1% lidocaine  was used for local anesthesia. Following this, a 19 gauge, 7-cm, Yueh catheter was introduced. An ultrasound image was saved for documentation purposes. The paracentesis was performed. The catheter was removed and a dressing was applied. The patient tolerated the procedure well without immediate post procedural complication. FINDINGS: A total of approximately 2.670 L of clear, straw-colored peritoneal fluid was removed. Samples were sent to the laboratory as requested by the clinical team. IMPRESSION: Successful ultrasound-guided paracentesis yielding 2.670 liters of peritoneal fluid. Procedure performed by April Griffon, PA-C Electronically Signed   By: JONETTA Faes  M.D.   On: 11/07/2023 16:15   CT CHEST ABDOMEN PELVIS W CONTRAST Result Date: 11/06/2023 CLINICAL DATA:  Lung cancer, restaging examination. History of cervical cancer. * Tracking Code: BO * EXAM: CT CHEST, ABDOMEN, AND PELVIS WITH CONTRAST TECHNIQUE: Multidetector CT imaging of the chest, abdomen and pelvis was performed following the standard protocol during bolus administration of intravenous contrast. RADIATION DOSE REDUCTION: This exam was performed according to the departmental dose-optimization program which includes automated exposure control, adjustment of the mA and/or kV according to patient size and/or use of iterative reconstruction technique. CONTRAST:  80mL OMNIPAQUE  IOHEXOL  300 MG/ML  SOLN COMPARISON:  09/17/2023, 06/26/2023 FINDINGS: CT CHEST FINDINGS Cardiovascular: Cardiac size within normal limits. No pericardial effusion. Mild coronary artery calcification. Central pulmonary arteries are of normal caliber. Mild atherosclerotic calcification within the thoracic aorta. No aortic aneurysm. Right internal jugular chest port tip seen within the superior cavoatrial junction. Mediastinum/Nodes: Visualized thyroid  is unremarkable. No pathologic thoracic adenopathy. Circumferential wall  thickening the mid esophagus at the level of carina may reflect changes of focal esophagitis as can be seen with radiation related, pill, or reflux esophagitis but is not well characterized on this examination. No evidence of obstruction or perforation. The esophagus is otherwise unremarkable. Lungs/Pleura: Moderate right and small left pleural effusions are present with associated bibasilar compressive atelectasis, stable since prior examination. When measured in similar fashion, the soft tissue mass within the right hilum is grossly stable, measuring 20 x 15 mm (50/3. There is grossly stable reticulation, interstitial thickening, volume loss, and traction bronchiectasis within the right upper lobe as well as the  anterior segment the left upper lobe, likely post inflammatory in nature. There is slightly progressive nodular consolidation and reticulation within the right lower lobe. The appearance, again, favors an inflammatory process and chronicity may reflect changes of chronic aspiration or chronic hypersensitivity pneumonitis. Lymphangitic spread of malignancy is not completely excluded, but is considered less likely given the relative stability of the similar findings noted within the right upper and left upper lobes. No pneumothorax. Musculoskeletal: No chest wall mass or suspicious bone lesions identified. CT ABDOMEN PELVIS FINDINGS Hepatobiliary: Mild hepatic steatosis. No enhancing intrahepatic mass. No intra or extrahepatic biliary ductal dilation. Gallbladder unremarkable. Pancreas: Unremarkable Spleen: Unremarkable Adrenals/Urinary Tract: Adrenal glands are unremarkable. Kidneys are normal, without renal calculi, focal lesion, or hydronephrosis. Bladder is unremarkable. Stomach/Bowel: Stomach is within normal limits. Appendix absent. No evidence of bowel wall thickening, distention, or inflammatory changes. Progressive, now large volume ascites noted Vascular/Lymphatic: Aortic atherosclerosis. No enlarged abdominal or pelvic lymph nodes. Reproductive: Status post hysterectomy. No adnexal masses. Other: Stable diffuse subcutaneous body wall edema. No abdominal wall hernia Musculoskeletal: No acute or significant osseous findings. IMPRESSION: 1. Stable disease with stable right hilar soft tissue mass. 2. Stable moderate right and small left pleural effusions with associated bibasilar compressive atelectasis. 3. Progressive nodular consolidation and reticulation within the right lower lobe. The appearance, again, favors an inflammatory process and chronicity may reflect changes of chronic aspiration or chronic fibrotic hypersensitivity pneumonitis. Lymphangitic spread of malignancy is not completely excluded, but is  considered less likely given the relative stability of the similar findings noted within the right upper and left upper lobes. 4. Circumferential wall thickening the mid esophagus at the level of the carina may reflect changes of focal esophagitis as can be seen with radiation related, pill, or reflux esophagitis but is not well characterized on this examination. No evidence of obstruction or perforation. 5. Mild hepatic steatosis. 6. Progressive, now large volume ascites. Stable diffuse subcutaneous body wall edema. Electronically Signed   By: Dorethia Molt M.D.   On: 11/06/2023 18:16     ASSESSMENT AND PLAN: This is a very pleasant 58 years old white female with Stage IV (T2b, N2, M1 C) non-small cell lung cancer, adenocarcinoma presented with right hilar mass in addition to mediastinal and upper abdominal lymphadenopathy in addition to bilateral adrenal metastases and metastatic disease in the musculature of the lower back.  This was diagnosed in April 2023. The molecular studies by [employer] showed no actionable mutations and the patient has negative PD-L1 expression. She is currently undergoing palliative radiotherapy to the right hilar region under the care of Dr. Dewey. The patient completed systemic chemotherapy started with carboplatin  for AUC of 5, Alimta  500 Mg/M2 and Keytruda  200 Mg IV every 3 weeks and starting from cycle #5 she is on maintenance treatment with Alimta  and Keytruda  every 3 weeks.  Status post a total  of 35 cycles.  Last dose was given in April 2025.  She is currently on observation Assessment and Plan    Stage 4 non-small cell lung cancer Stage 4 non-small cell lung cancer diagnosed in April 2023 with no actionable mutations and negative PD-L1 expression. Status post treatment with chemoimmunotherapy including carboplatin , Alimta , and Keytruda , followed by maintenance therapy with Alimta  and Keytruda  every 3 weeks. Current concern for malignant ascites and pleural effusion  potentially indicating disease progression. - Await pathology results of ascitic fluid to determine if malignant. - If ascitic fluid is positive for cancer, initiate treatment. - Schedule follow-up appointment in 6 weeks with repeat scan to assess disease status. - If pathology indicates cancer, contact her to discuss treatment options sooner.  Ascites Significant ascites noted on recent imaging, with 2.6 liters drained. Awaiting pathology results to determine if ascites is malignant. If malignant, may indicate progression of lung cancer. - Await pathology results of ascitic fluid. - If ascites recurs, consider repeat drainage and reassessment.  Bilateral pleural effusion Bilateral pleural effusion noted on imaging, contributing to shortness of breath. Improvement in breathing noted after drainage of ascitic fluid. Potential need for drainage if shortness of breath worsens. - Monitor for worsening shortness of breath. - If shortness of breath worsens, consider drainage of pleural effusion.   The patient was advised to call immediately if she has any concerning symptoms in the interval. The patient voices understanding of current disease status and treatment options and is in agreement with the current care plan. All questions were answered. The patient knows to call the clinic with any problems, questions or concerns. We can certainly see the patient much sooner if necessary.  The total time spent in the appointment was 30 minutes.  Disclaimer: This note was dictated with voice recognition software. Similar sounding words can inadvertently be transcribed and may not be corrected upon review.

## 2023-11-12 LAB — CYTOLOGY - NON PAP

## 2023-11-23 ENCOUNTER — Other Ambulatory Visit: Payer: Self-pay

## 2023-11-23 ENCOUNTER — Emergency Department (HOSPITAL_COMMUNITY)
Admission: EM | Admit: 2023-11-23 | Discharge: 2023-11-23 | Disposition: A | Discharge status: Home / Self Care | Attending: Emergency Medicine | Admitting: Emergency Medicine

## 2023-11-23 ENCOUNTER — Encounter (HOSPITAL_COMMUNITY): Payer: Self-pay | Admitting: Emergency Medicine

## 2023-11-23 ENCOUNTER — Emergency Department (HOSPITAL_COMMUNITY)

## 2023-11-23 DIAGNOSIS — R109 Unspecified abdominal pain: Secondary | ICD-10-CM | POA: Insufficient documentation

## 2023-11-23 DIAGNOSIS — R918 Other nonspecific abnormal finding of lung field: Secondary | ICD-10-CM | POA: Diagnosis not present

## 2023-11-23 DIAGNOSIS — C786 Secondary malignant neoplasm of retroperitoneum and peritoneum: Secondary | ICD-10-CM | POA: Insufficient documentation

## 2023-11-23 DIAGNOSIS — R188 Other ascites: Secondary | ICD-10-CM | POA: Insufficient documentation

## 2023-11-23 DIAGNOSIS — R180 Malignant ascites: Secondary | ICD-10-CM | POA: Insufficient documentation

## 2023-11-23 DIAGNOSIS — Z7901 Long term (current) use of anticoagulants: Secondary | ICD-10-CM | POA: Diagnosis not present

## 2023-11-23 DIAGNOSIS — Z7951 Long term (current) use of inhaled steroids: Secondary | ICD-10-CM | POA: Insufficient documentation

## 2023-11-23 DIAGNOSIS — R0602 Shortness of breath: Secondary | ICD-10-CM | POA: Diagnosis not present

## 2023-11-23 DIAGNOSIS — J90 Pleural effusion, not elsewhere classified: Secondary | ICD-10-CM | POA: Diagnosis not present

## 2023-11-23 DIAGNOSIS — J449 Chronic obstructive pulmonary disease, unspecified: Secondary | ICD-10-CM | POA: Diagnosis not present

## 2023-11-23 DIAGNOSIS — Z85118 Personal history of other malignant neoplasm of bronchus and lung: Secondary | ICD-10-CM | POA: Insufficient documentation

## 2023-11-23 LAB — BASIC METABOLIC PANEL WITH GFR
Anion gap: 8 (ref 5–15)
BUN: 13 mg/dL (ref 6–20)
CO2: 24 mmol/L (ref 22–32)
Calcium: 8.7 mg/dL — ABNORMAL LOW (ref 8.9–10.3)
Chloride: 105 mmol/L (ref 98–111)
Creatinine, Ser: 0.89 mg/dL (ref 0.44–1.00)
GFR, Estimated: >60 mL/min (ref >60)
Glucose, Bld: 83 mg/dL (ref 70–99)
Potassium: 4 mmol/L (ref 3.5–5.1)
Sodium: 137 mmol/L (ref 135–145)

## 2023-11-23 LAB — CBC
HCT: 35.9 % — ABNORMAL LOW (ref 36.0–46.0)
Hemoglobin: 11.2 g/dL — ABNORMAL LOW (ref 12.0–15.0)
MCH: 31.7 pg (ref 26.0–34.0)
MCHC: 31.2 g/dL (ref 30.0–36.0)
MCV: 101.7 fL — ABNORMAL HIGH (ref 80.0–100.0)
Platelets: 295 K/uL (ref 150–400)
RBC: 3.53 MIL/uL — ABNORMAL LOW (ref 3.87–5.11)
RDW: 13.4 % (ref 11.5–15.5)
WBC: 4.9 K/uL (ref 4.0–10.5)
nRBC: 0 % (ref 0.0–0.2)

## 2023-11-23 MED ORDER — FUROSEMIDE 10 MG/ML IJ SOLN
40.0000 mg | Freq: Once | INTRAMUSCULAR | Status: AC
  Administered 2023-11-23: 40 mg via INTRAVENOUS
  Filled 2023-11-23: qty 4

## 2023-11-23 MED ORDER — IPRATROPIUM-ALBUTEROL 0.5-2.5 (3) MG/3ML IN SOLN
3.0000 mL | Freq: Once | RESPIRATORY_TRACT | Status: AC
  Administered 2023-11-23: 3 mL via RESPIRATORY_TRACT
  Filled 2023-11-23: qty 3

## 2023-11-23 MED ORDER — HEPARIN SOD (PORK) LOCK FLUSH 100 UNIT/ML IV SOLN
500.0000 [IU] | Freq: Once | INTRAVENOUS | Status: AC
  Administered 2023-11-23: 500 [IU]
  Filled 2023-11-23: qty 5

## 2023-11-23 NOTE — Discharge Instructions (Addendum)
 You should receive a call from our scheduler to get your paracentesis outpatient.  If they cannot get you in quickly you may want to contact your oncologist to help expedite the care.

## 2023-11-23 NOTE — ED Provider Notes (Signed)
 [place] [facility] AT [facility] Provider Note   CSN: [id number] Arrival date & time: 11/23/23  0940     Patient presents with: Shortness of Breath   April Walsh is a 58 y.o. female with past medical history significant for COPD, lung cancer, previous stroke, ascites who presents with concern for shortness of breath, fluid on her stomach.  She reports that she had fluid pulled off on 11/07/2023.  She denies any nausea, vomiting, diarrhea.  She endorses some dizziness but denies any lightheadedness.  She endorses 4/10 pain in the abdomen secondary to the fluid.    Shortness of Breath      Prior to Admission medications   Medication Sig Start Date End Date Taking? Authorizing Provider  albuterol  (ACCUNEB ) 0.63 MG/3ML nebulizer solution Take 3 mLs (0.63 mg total) by nebulization every 4 (four) hours as needed for wheezing or shortness of breath. 06/25/23   Shelah Lamar RAMAN, MD  albuterol  (VENTOLIN  HFA) 108 (90 Base) MCG/ACT inhaler Inhale 2 puffs into the lungs every 6 (six) hours as needed for wheezing or shortness of breath. 07/18/23   Shelah Lamar RAMAN, MD  apixaban  (ELIQUIS ) 2.5 MG TABS tablet Take 1 tablet (2.5 mg total) by mouth 2 (two) times daily. 09/12/21   Bryn Bernardino NOVAK, MD  atorvastatin  (LIPITOR) 80 MG tablet TAKE 1 TABLET BY MOUTH EVERY DAY Patient not taking: Reported on 09/30/2023 08/27/22   Rosemarie Eather RAMAN, MD  Budeson-Glycopyrrol-Formoterol  (BREZTRI  AEROSPHERE) 160-9-4.8 MCG/ACT AERO Inhale 2 puffs into the lungs in the morning and at bedtime. 07/17/23   Shelah Lamar RAMAN, MD  folic acid  (FOLVITE ) 1 MG tablet TAKE 1 TABLET BY MOUTH EVERY DAY 09/22/23   Sherrod Sherrod, MD  lidocaine -prilocaine  (EMLA ) cream APPLY TOPICALLY 1 APPLICATION AS NEEDED 07/10/23   Heilingoetter, Cassandra L, PA-C  Na Sulfate-K Sulfate-Mg Sulfate concentrate (SUPREP) 17.5-3.13-1.6 GM/177ML SOLN TAKE 1 KIT (354 MLS TOTAL) BY MOUTH ONCE FOR 1 DOSE. 07/28/23   [provider]   pantoprazole  (PROTONIX ) 40 MG tablet Take 1 tablet (40 mg total) by mouth 2 (two) times daily. Twice day for 6 weeks then 40 mg daily. 09/11/23   Cirigliano, Vito V, DO  predniSONE  (DELTASONE ) 10 MG tablet 4 tabs for 2 days, then 3 tabs for 2 days, 2 tabs for 2 days, then 1 tab for 2 days, then stop 09/30/23   Cobb, Comer GAILS, NP  prochlorperazine  (COMPAZINE ) 10 MG tablet Take 1 tablet (10 mg total) by mouth every 6 (six) hours as needed. 01/08/23   Heilingoetter, Cassandra L, PA-C  sucralfate  (CARAFATE ) 1 g tablet Crush 1 tablet and dissolve in 10ml of warm water, mix well to create slurry and drink every 4 times daily for 4 weeks. 09/15/23   Cirigliano, Vito V, DO    Allergies: Remeron  [mirtazapine ]    Review of Systems  Respiratory:  Positive for shortness of breath.   All other systems reviewed and are negative.   Updated Vital Signs BP 104/80 (BP Location: Right Arm)   Pulse 100   Temp 98.1 F (36.7 C) (Oral)   Resp 15   Ht 5' (1.524 m)   Wt 48.1 kg   SpO2 100%   BMI 20.70 kg/m   Physical Exam Vitals and nursing note reviewed.  Constitutional:      General: She is not in acute distress.    Appearance: Normal appearance.  HENT:     Head: Normocephalic and atraumatic.  Eyes:     General:  Right eye: No discharge.        Left eye: No discharge.  Cardiovascular:     Rate and Rhythm: Normal rate and regular rhythm.     Heart sounds: No murmur heard.    No friction rub. No gallop.  Pulmonary:     Effort: Pulmonary effort is normal.     Breath sounds: Normal breath sounds.     Comments: Diffuse wheezing, some crackles at lung bases. Abdominal:     General: Bowel sounds are normal.     Palpations: Abdomen is soft.     Comments: Distended abdomen without significant rigidity, mild tenderness throughout.  Skin:    General: Skin is warm and dry.     Capillary Refill: Capillary refill takes less than 2 seconds.  Neurological:     Mental Status: She is alert and  oriented to person, place, and time.  Psychiatric:        Mood and Affect: Mood normal.        Behavior: Behavior normal.     (all labs ordered are listed, but only abnormal results are displayed) Labs Reviewed  BASIC METABOLIC PANEL WITH GFR - Abnormal; Notable for the following components:      Result Value   Calcium  8.7 (*)    All other components within normal limits  CBC - Abnormal; Notable for the following components:   RBC 3.53 (*)    Hemoglobin 11.2 (*)    HCT 35.9 (*)    MCV 101.7 (*)    All other components within normal limits    EKG: None  Radiology: DG Chest 2 View Result Date: 11/23/2023 CLINICAL DATA:  SOB EXAM: CHEST - 2 VIEW COMPARISON:  Oct 03, 2023 FINDINGS: The cardiomediastinal silhouette is unchanged in contour. Similar irregular RIGHT hilar contours. RIGHT chest port with tip terminating over the superior cavoatrial junction. Small to moderate bilateral pleural effusions, similar compared to prior. No pneumothorax. Similar appearance of coarse bilateral perihilar predominant reticulation. Favored slightly increased homogeneous appearing opacity at the RIGHT lung base. Visualized abdomen is unremarkable. Multilevel degenerative changes of the thoracic spine. IMPRESSION: 1. Small to moderate bilateral pleural effusions, similar compared to prior. 2. Favored slightly increased homogeneous appearing opacity at the RIGHT lung base. This could reflect atelectasis versus infection. 3. Similar appearance of coarse bilateral perihilar predominant reticulation. Electronically Signed   By: Corean Salter M.D.   On: 11/23/2023 10:58     Procedures   Medications Ordered in the ED  ipratropium-albuterol  (DUONEB) 0.5-2.5 (3) MG/3ML nebulizer solution 3 mL (3 mLs Nebulization Given 11/23/23 1123)  furosemide  (LASIX ) injection 40 mg (40 mg Intravenous Given 11/23/23 1123)  heparin  lock flush 100 unit/mL (500 Units Intracatheter Given 11/23/23 1300)    Clinical Course as  of 11/23/23 1332  Sun Nov 23, 2023  1238 [id number] [CP]    Clinical Course User Index [CP] Rosan Sherlean DEL, PA-C                                 Medical Decision Making Amount and/or Complexity of Data Reviewed Labs: ordered. Radiology: ordered.  Risk Prescription drug management.   This patient is a 58 y.o. female  who presents to the ED for concern of shortness of breath, fluid overload.   Differential diagnoses prior to evaluation: The emergent differential diagnosis includes, but is not limited to,  asthma exacerbation, COPD exacerbation, acute upper respiratory infection, acute bronchitis, chronic  bronchitis, interstitial lung disease, ARDS, PE, pneumonia, atypical ACS, carbon monoxide poisoning, spontaneous pneumothorax, new CHF vs CHF exacerbation, versus other . This is not an exhaustive differential.   Past Medical History / Co-morbidities / Social History: COPD, lung cancer, malignant ascites  Additional history: Chart reviewed. Pertinent results include: Extensively reviewed recent oncology visits, outpatient pulmonology visits, notably with recent drainage of around 2.6 L of ascites fluid just around 3 weeks ago.  Physical Exam: Physical exam performed. The pertinent findings include: Does have fluid-filled abdomen, some wheezing, crackles noted on initial exam.  Wheezing significantly improved after DuoNeb and 1 dose of Lasix .  Lab Tests/Imaging studies: I personally interpreted labs/imaging and the pertinent results include: Overall unremarkable other than mild anemia, hemoglobin 11.2, BMP with mild hypocalcemia, calcium  8.7.  Chest x-ray shows some bilateral pleural effusions, probable atelectasis versus infection, given no white count, no fever, no chills, low clinical suspicion for infectious ideology given her otherwise apparent fluid overload. I agree with the radiologist interpretation.  Cardiac monitoring: EKG obtained and interpreted by myself and  attending physician which shows: Normal sinus rhythm, left ventricular hypertrophy noted   Medications: I ordered medication including Lasix , DuoNeb for fluid overload, wheezing.  I have reviewed the patients home medicines and have made adjustments as needed.  Improved wheezing on reevaluation.  I spoke with the interventional radiologist, Dr. Luverne who helped to coordinate patient for outpatient drainage of her ascites.  There is no evidence for an emergent need for drainage today, but I do think that she would benefit from therapeutic paracentesis in the near future.  Encourage close follow-up with her oncologist.   Disposition: After consideration of the diagnostic results and the patients response to treatment, I feel that patient discharged with order for IR paracentesis.   emergency department workup does not suggest an emergent condition requiring admission or immediate intervention beyond what has been performed at this time. The plan is: as above. The patient is safe for discharge and has been instructed to return immediately for worsening symptoms, change in symptoms or any other concerns.   Final diagnoses:  Malignant ascites    ED Discharge Orders          Ordered    Paracentesis        11/23/23 1058    IR Paracentesis        11/23/23 1247               Broghan Pannone, [place] H, PA-C 11/23/23 1332    Elnor Jayson LABOR, DO 11/23/23 2137

## 2023-11-23 NOTE — ED Triage Notes (Signed)
 Pt c/o SOB. Pt has stage 4 lung cancer and states she feels like she has fluid in her stomach. She also states she last had fluid pulled off on 11/07/23. Denies N/V/D. Some dizziness denies lightheadedness.

## 2023-11-26 ENCOUNTER — Telehealth: Payer: Self-pay | Admitting: Medical Oncology

## 2023-11-26 NOTE — Telephone Encounter (Signed)
 Pt called about scheduling her paracentesis ordered by Sherlean Carota, PA-C. I sent message to central scheduling  to call pt to schedule it.

## 2023-11-27 ENCOUNTER — Ambulatory Visit

## 2023-11-27 ENCOUNTER — Ambulatory Visit: Admitting: Internal Medicine

## 2023-11-27 ENCOUNTER — Other Ambulatory Visit

## 2023-12-01 ENCOUNTER — Ambulatory Visit (HOSPITAL_COMMUNITY)
Admission: RE | Admit: 2023-12-01 | Discharge: 2023-12-01 | Disposition: A | Discharge status: Home / Self Care | Source: Ambulatory Visit | Attending: Emergency Medicine | Admitting: Emergency Medicine

## 2023-12-01 DIAGNOSIS — R180 Malignant ascites: Secondary | ICD-10-CM | POA: Insufficient documentation

## 2023-12-01 DIAGNOSIS — R188 Other ascites: Secondary | ICD-10-CM | POA: Diagnosis not present

## 2023-12-01 DIAGNOSIS — C3490 Malignant neoplasm of unspecified part of unspecified bronchus or lung: Secondary | ICD-10-CM | POA: Diagnosis not present

## 2023-12-01 HISTORY — PX: IR PARACENTESIS: IMG2679

## 2023-12-01 MED ORDER — LIDOCAINE HCL 1 % IJ SOLN
INTRAMUSCULAR | Status: AC
  Filled 2023-12-01: qty 20

## 2023-12-01 MED ORDER — LIDOCAINE HCL 1 % IJ SOLN
20.0000 mL | Freq: Once | INTRAMUSCULAR | Status: AC
  Administered 2023-12-01: 10 mL via INTRADERMAL

## 2023-12-01 MED ORDER — LIDOCAINE HCL 1 % IJ SOLN: 20.0000 mL | Freq: Once | INTRAMUSCULAR | Status: DC

## 2023-12-01 NOTE — Procedures (Signed)
 Ultrasound-guided therapeutic paracentesis performed yielding 1.7 liters of straw colored fluid. No immediate complications. EBL is none.

## 2023-12-09 ENCOUNTER — Other Ambulatory Visit: Payer: Self-pay | Admitting: Physician Assistant

## 2023-12-09 DIAGNOSIS — C3491 Malignant neoplasm of unspecified part of right bronchus or lung: Secondary | ICD-10-CM

## 2023-12-11 ENCOUNTER — Telehealth: Payer: Self-pay

## 2023-12-11 NOTE — Telephone Encounter (Signed)
 Spoke with patient regarding medication refill request for Remeron . Patient stated that she does not take this medication.  Patient had questions regarding her upcoming appointments. Informed patient that I would speak with Dr. Sherrod and the team will adjust appointments as needed. Patient voiced understanding.

## 2023-12-11 NOTE — Telephone Encounter (Signed)
 Spoke with patient and rescheduled appointments to September due to recent CT scan completed in late June. Patient was informed of the new appointment dates and times and voiced understanding.

## 2023-12-19 ENCOUNTER — Ambulatory Visit: Admitting: Nurse Practitioner

## 2023-12-19 ENCOUNTER — Encounter: Payer: Self-pay | Admitting: Nurse Practitioner

## 2023-12-19 ENCOUNTER — Ambulatory Visit (INDEPENDENT_AMBULATORY_CARE_PROVIDER_SITE_OTHER)

## 2023-12-19 ENCOUNTER — Ambulatory Visit: Payer: Self-pay | Admitting: Nurse Practitioner

## 2023-12-19 VITALS — BP 128/80 | HR 105 | Ht 60.0 in | Wt 105.0 lb

## 2023-12-19 DIAGNOSIS — C3491 Malignant neoplasm of unspecified part of right bronchus or lung: Secondary | ICD-10-CM

## 2023-12-19 DIAGNOSIS — J90 Pleural effusion, not elsewhere classified: Secondary | ICD-10-CM | POA: Diagnosis not present

## 2023-12-19 DIAGNOSIS — J449 Chronic obstructive pulmonary disease, unspecified: Secondary | ICD-10-CM | POA: Diagnosis not present

## 2023-12-19 DIAGNOSIS — R918 Other nonspecific abnormal finding of lung field: Secondary | ICD-10-CM | POA: Diagnosis not present

## 2023-12-19 MED ORDER — OHTUVAYRE 3 MG/2.5ML IN SUSP: 2.5000 mL | Freq: Two times a day (BID) | RESPIRATORY_TRACT | Status: AC

## 2023-12-19 MED ORDER — PREDNISONE 10 MG PO TABS
ORAL_TABLET | ORAL | 0 refills | Status: DC
Start: 2023-12-19 — End: 2023-12-26

## 2023-12-19 NOTE — Assessment & Plan Note (Addendum)
 Very severe COPD with high symptom burden that is multifactorial. Appears to be having a mild exacerbation with associated bronchospasm and persistent dyspnea. No increased sputum production or purulent sputum. On exam, effusions seem stable. Will recheck CXR today and determine if repeat thora or consideration of PleurX is necessary. Will treat her with repeat prednisone  taper as she received benefit from use previously. Initiate inhaled phosphodiesterase inhibitor therapy with Ohtuvayre . Side effect profile reviewed. Patient enrollment completed. Action plan in place.   Patient Instructions  Continue Breztri  2 puffs Twice daily. Brush tongue and rinse mouth afterwards Continue Albuterol  inhaler 2 puffs or 3 mL neb every 6 hours as needed for shortness of breath or wheezing. Notify if symptoms persist despite rescue inhaler/neb use.   -Start Ohtuvayre  2.5 mL neb Twice daily. Monitor for any mood changes and notify if these occur. This is an additional maintenance medication that you will use twice daily, regardless of how you feel. Not meant for emergency use -Prednisone  taper. 4 tabs for 3 days, then 3 tabs for 3 days, 2 tabs for 3 days, then 1 tab for 3 days, then stop. Take in AM with food   Chest x ray today Will decide if we need to repeat a thoracentesis or decide on PleurX drain placement, if the fluid has re-accumulated    Follow up in 4-6 weeks with Dr. Shelah or Izetta Malachy PIETY. If symptoms do not improve or worsen, please contact office for sooner follow up or seek emergency care.

## 2023-12-19 NOTE — Assessment & Plan Note (Signed)
 Prior concurrent chemoradiation and Keytruda . Under active surveillance. Last scan was stable aside from increased effusion. See above. Will need to closely monitor off therapy. No evidence of progression of ground glass opacities at this time. Follow up with oncology as scheduled.

## 2023-12-19 NOTE — Assessment & Plan Note (Signed)
 Right thoracentesis 5/22 with 500 mL clear yellow fluid. Cultures/cytology not sent. She has bilateral effusions. Appear stable on exam today. Will check CXR. If she has recurrence, may need repeat thora or consider PleurX.

## 2023-12-19 NOTE — Progress Notes (Signed)
 @Patient  ID: April Walsh, female    DOB: 1966/01/29, 58 y.o.   MRN: [id number]  Chief Complaint  Patient presents with   COPD    Patient states her breathing has gotten a lot better.    Referring provider: [name], *  HPI: 58 year old female, former smoker followed for COPD and adenocarcinoma. She is a patient of Dr. Lanny and last seen in office 09/30/2023 by [facility] NP. Past medical history significant for cervical cancer, migraines, depression, hx of CVA, anxiety, depression.   TEST/EVENTS:  09/17/2023 CT chest: Port-A-Cath. Distal esophageal wall thickening. Similar soft tissue thickening about the right hilum, middle mediastinum. Right perihilar problem loss with architectural distortion, interstitial thickening and airway thickening similar to prior examination including a nodular component, 2.2x1.4 cm and stable. New 7 mm nodule RLL. Multifocal interstitial/interlobular septal thickening with patchy ground glass, stable. Increased size moderate left effusion.  10/14/2023 PFT: FVC 44, FEV1 33, ratio 56, TLC 77, DLCOcor 40   06/25/2023: OV with Dr. Shelah. Stage IV adenocarcinoma of the right lung diagnosed in 08/2021 by bronchoscopy. Treated with concurrent chemoradiation and immune therapy with Keytruda . Course complicated by pleural effusions requiring thoracenteses, last 02/2023. Intermittent wheezing. Intermittent SOB with exertion. Associated dizziness and weakness in setting of anemia end of 2024, treated with PRBC. Improved but still with exertional SOB with chores, notable decrease in functional capacity. Remains on Breztri . Got albuterol  HFA from oncology; feels this helps. Repeat CT scheduled. Question if she is reaccumulating pleural fluid, may benefit from repeat thora. Will await CT imaging. Note that she also has increased risk for pneumonitis with Keytruda . If progressive groundglass infiltrates, will need to discuss with Dr. Sherrod. Rx for albuterol  nebs.    09/30/2023: April Walsh with Drucella Karbowski NP Discussed the use of AI scribe software for clinical note transcription with the patient, who gave verbal consent to proceed. April Walsh is a 58 year old female with lung cancer who presents with worsening breathing difficulties. She has been experiencing worsening breathing difficulties over the last couple of months. CT chest showed increasing left pleural effusion. She is scheduled for a thoracentesis in two days, with her last procedure having been in October 2024. She experienced relief until late December 2024, when symptoms began to worsen again.  Since January 2025, she has experienced significant fatigue, stating that simple activities like getting out of a recliner leave her 'wore out.'  She is planned to take a break from cancer treatment as aside from the pleural effusion, her scans are stable.  Her hemoglobin levels are still low but overall improved, currently at 10 g/dL. She uses Breztri  and a rescue inhaler, and she uses a nebulizer approximately every three to four days. She experiences wheezing and a cough producing whitish phlegm. No fever or hemoptysis. Her appetite is reduced, but she describes her eating as normal for her since she was diagnosed with cancer and on treatment.  No leg swelling, orthopnea, PND.     12/19/2023: Today - follow up Discussed the use of AI scribe software for clinical note transcription with the patient, who gave verbal consent to proceed.  History of Present Illness April Walsh is a 58 year old female with severe COPD and recurrent pleural effusions who presents for follow up.  She feels better since her last visit, and after her thoracentesis that she had end of May 2025. She then had a paracentesis May and July, last 7/21. Both of these have helped with her breathing. She  continues to experience wheezing and occasional coughing with minimal sputum production. No hemoptysis is present. Still has some chest tightness.  No fevers, chills, leg swelling.   Her history of severe COPD and recurrent pleural effusions has necessitated previous thoracentesis and paracentesis procedures. A CT scan is scheduled for September.  She can sense when a thoracentesis might be needed again. Feels stable right now. She is currently on a break from chemo treatment but has previously found prednisone  helpful in managing her symptoms.  Her current medications include Breztri  and albuterol  for breathing difficulties.     Allergies  Allergen Reactions   Remeron  [Mirtazapine ] Other (See Comments)    States she slept for 5 days    Immunization History  Administered Date(s) Administered   Influenza Split 05/09/2015   PNEUMOCOCCAL CONJUGATE-20 08/08/2021    Past Medical History:  Diagnosis Date   Anemia    Asthma    Basal cell carcinoma    Bronchitis    Cervical cancer (HCC)    COPD (chronic obstructive pulmonary disease) (HCC)    early stages of COPD   Headache    HLD (hyperlipidemia)    Lung cancer (HCC) 08/13/2021    Tobacco History: Social History   Tobacco Use  Smoking Status Former   Current packs/day: 0.00   Average packs/day: 0.5 packs/day for 32.0 years (16.0 ttl pk-yrs)   Types: Cigarettes   Quit date: 08/11/2021   Years since quitting: 2.3   Passive exposure: Past  Smokeless Tobacco Never  Tobacco Comments   1/2 pack smoked daily ARJ, RN 08/02/21   Counseling given: Not Answered Tobacco comments: 1/2 pack smoked daily ARJ, RN 08/02/21   Outpatient Medications Prior to Visit  Medication Sig Dispense Refill   albuterol  (ACCUNEB ) 0.63 MG/3ML nebulizer solution Take 3 mLs (0.63 mg total) by nebulization every 4 (four) hours as needed for wheezing or shortness of breath. 75 mL 11   albuterol  (VENTOLIN  HFA) 108 (90 Base) MCG/ACT inhaler Inhale 2 puffs into the lungs every 6 (six) hours as needed for wheezing or shortness of breath. 8 g 8   apixaban  (ELIQUIS ) 2.5 MG TABS tablet Take 1 tablet (2.5  mg total) by mouth 2 (two) times daily. 60 tablet 0   Budeson-Glycopyrrol-Formoterol  (BREZTRI  AEROSPHERE) 160-9-4.8 MCG/ACT AERO Inhale 2 puffs into the lungs in the morning and at bedtime. 10.7 each 10   folic acid  (FOLVITE ) 1 MG tablet TAKE 1 TABLET BY MOUTH EVERY DAY 30 tablet 2   atorvastatin  (LIPITOR) 80 MG tablet TAKE 1 TABLET BY MOUTH EVERY DAY (Patient not taking: Reported on 12/19/2023) 30 tablet 3   lidocaine -prilocaine  (EMLA ) cream APPLY TOPICALLY 1 APPLICATION AS NEEDED (Patient not taking: Reported on 12/19/2023) 30 g 2   Na Sulfate-K Sulfate-Mg Sulfate concentrate (SUPREP) 17.5-3.13-1.6 GM/177ML SOLN TAKE 1 KIT (354 MLS TOTAL) BY MOUTH ONCE FOR 1 DOSE. (Patient not taking: Reported on 12/19/2023)     pantoprazole  (PROTONIX ) 40 MG tablet Take 1 tablet (40 mg total) by mouth 2 (two) times daily. Twice day for 6 weeks then 40 mg daily. (Patient not taking: Reported on 12/19/2023) 180 tablet 3   prochlorperazine  (COMPAZINE ) 10 MG tablet Take 1 tablet (10 mg total) by mouth every 6 (six) hours as needed. (Patient not taking: Reported on 12/19/2023) 30 tablet 2   sucralfate  (CARAFATE ) 1 g tablet Crush 1 tablet and dissolve in 10ml of warm water, mix well to create slurry and drink every 4 times daily for 4 weeks. (Patient not taking: Reported  on 12/19/2023) 120 tablet 1   predniSONE  (DELTASONE ) 10 MG tablet 4 tabs for 2 days, then 3 tabs for 2 days, 2 tabs for 2 days, then 1 tab for 2 days, then stop (Patient not taking: Reported on 12/19/2023) 20 tablet 0   Facility-Administered Medications Prior to Visit  Medication Dose Route Frequency Provider Last Rate Last Admin   cyanocobalamin  (VITAMIN B12) 1000 MCG/ML injection            prochlorperazine  (COMPAZINE ) 10 MG tablet              Review of Systems:   Constitutional: No weight loss or gain, night sweats, fevers, chills +fatigue, lassitude (baseline) HEENT: No headaches, difficulty swallowing, tooth/dental problems, or sore throat. No sneezing,  itching, ear ache, nasal congestion, or post nasal drip CV:  No chest pain, orthopnea, PND, swelling in lower extremities, anasarca, dizziness, palpitations, syncope Resp: + shortness of breath with exertion; minimal cough; wheeze.  No hemoptysis. No chest wall deformity GI:  No heartburn, indigestion, abdominal pain, nausea, vomiting, diarrhea, change in bowel habits, bloody stools.  GU: No dysuria, change in color of urine, urgency or frequency.  No flank pain, no hematuria  Skin: No rash, lesions, ulcerations MSK:  No joint pain or swelling.  Neuro: No dizziness or lightheadedness.  Psych: No depression or anxiety. Mood stable.     Physical Exam:  BP 128/80 (BP Location: Right Arm, Patient Position: Sitting, Cuff Size: Normal)   Pulse (!) 105   Ht 5' (1.524 m)   Wt 105 lb (47.6 kg)   SpO2 95%   BMI 20.51 kg/m   GEN: Pleasant, interactive, well-kempt; in no acute distress HEENT:  Normocephalic and atraumatic. PERRLA. Sclera white. Nasal turbinates pink, moist and patent bilaterally. No rhinorrhea present. Oropharynx pink and moist, without exudate or edema. No lesions, ulcerations, or postnasal drip.  NECK:  Supple w/ fair ROM. No JVD present. Thyroid  symmetrical with no goiter or nodules palpated. No lymphadenopathy.   CV: RRR, no m/r/g, no peripheral edema. Pulses intact, +2 bilaterally. No cyanosis, pallor or clubbing. PULMONARY:  Unlabored, regular breathing. Scattered wheezes bilaterally A&P. Improved aeration b/l lower lobes. No accessory muscle use.  GI: BS present and normoactive. Soft, non-tender to palpation. No organomegaly or masses detected.  MSK: No erythema, warmth or tenderness. Cap refil <2 sec all extrem. No deformities or joint swelling noted.  Neuro: A/Ox3. No focal deficits noted.   Skin: Warm, no lesions or rashe Psych: Normal affect and behavior. Judgement and thought content appropriate.     Lab Results:  CBC    Component Value Date/Time   WBC 4.9  11/23/2023 1021   RBC 3.53 (L) 11/23/2023 1021   HGB 11.2 (L) 11/23/2023 1021   HGB 11.8 (L) 11/06/2023 0820   HCT 35.9 (L) 11/23/2023 1021   PLT 295 11/23/2023 1021   PLT 276 11/06/2023 0820   MCV 101.7 (H) 11/23/2023 1021   MCH 31.7 11/23/2023 1021   MCHC 31.2 11/23/2023 1021   RDW 13.4 11/23/2023 1021   LYMPHSABS 1.3 11/06/2023 0820   MONOABS 1.0 11/06/2023 0820   EOSABS 0.1 11/06/2023 0820   BASOSABS 0.0 11/06/2023 0820    BMET    Component Value Date/Time   NA 137 11/23/2023 1021   K 4.0 11/23/2023 1021   CL 105 11/23/2023 1021   CO2 24 11/23/2023 1021   GLUCOSE 83 11/23/2023 1021   BUN 13 11/23/2023 1021   CREATININE 0.89 11/23/2023 1021   CREATININE  0.77 11/06/2023 0820   CALCIUM  8.7 (L) 11/23/2023 1021   GFRNONAA >60 11/23/2023 1021   GFRNONAA >60 11/06/2023 0820    BNP No results found for: BNP   Imaging:  DG Chest 2 View Result Date: 12/19/2023 CLINICAL DATA:  Evaluate pleural effusion.  History of lung cancer. EXAM: CHEST - 2 VIEW COMPARISON:  11/23/2023 FINDINGS: Right IJ Port-A-Cath unchanged. Lungs are adequately inflated and demonstrate continued evidence of small bilateral pleural effusions right worse than left without significant change. Likely associated bibasilar atelectasis. Minimal peripheral patchy nodular opacification over the right midlung. Stable right hilar prominence. Cardiomediastinal silhouette and remainder of the exam is unchanged. IMPRESSION: 1. Stable small bilateral pleural effusions right worse than left with likely associated bibasilar atelectasis. Minimal peripheral patchy nodular opacification over the right midlung slightly more prominent and may be due to progression of underlying lung cancer versus infection. 2. Stable right hilar prominence. Electronically Signed   By: Toribio Agreste M.D.   On: 12/19/2023 10:15   IR Paracentesis Result Date: 12/01/2023 INDICATION: 58 year old female. History of non-small cell cancer with recurrent  ascites. Request is for therapeutic paracentesis EXAM: ULTRASOUND GUIDED THERAPEUTIC RIGHT-SIDED PARACENTESIS MEDICATIONS: Lidocaine  1% 10 mL COMPLICATIONS: None immediate. PROCEDURE: Informed written consent was obtained from the patient after a discussion of the risks, benefits and alternatives to treatment. A timeout was performed prior to the initiation of the procedure. Initial ultrasound scanning demonstrates a small amount of ascites within the right lower abdominal quadrant. The right lower abdomen was prepped and draped in the usual sterile fashion. 1% lidocaine  was used for local anesthesia. Following this, a 19 gauge, 7-cm, Yueh catheter was introduced. An ultrasound image was saved for documentation purposes. The paracentesis was performed. The catheter was removed and a dressing was applied. The patient tolerated the procedure well without immediate post procedural complication. FINDINGS: A total of approximately 1.7 L of straw-colored fluid was removed. IMPRESSION: Successful ultrasound-guided therapeutic right-sided paracentesis yielding 1.7 liters of peritoneal fluid. Performed by Delon Beagle NP Electronically Signed   By: Wilkie Lent M.D.   On: 12/01/2023 13:07   DG Chest 2 View Result Date: 11/23/2023 CLINICAL DATA:  SOB EXAM: CHEST - 2 VIEW COMPARISON:  Oct 03, 2023 FINDINGS: The cardiomediastinal silhouette is unchanged in contour. Similar irregular RIGHT hilar contours. RIGHT chest port with tip terminating over the superior cavoatrial junction. Small to moderate bilateral pleural effusions, similar compared to prior. No pneumothorax. Similar appearance of coarse bilateral perihilar predominant reticulation. Favored slightly increased homogeneous appearing opacity at the RIGHT lung base. Visualized abdomen is unremarkable. Multilevel degenerative changes of the thoracic spine. IMPRESSION: 1. Small to moderate bilateral pleural effusions, similar compared to prior. 2. Favored  slightly increased homogeneous appearing opacity at the RIGHT lung base. This could reflect atelectasis versus infection. 3. Similar appearance of coarse bilateral perihilar predominant reticulation. Electronically Signed   By: Corean Salter M.D.   On: 11/23/2023 10:58    heparin  lock flush 100 unit/mL     Date Action Dose Route User   Discharged on 11/23/2023   Admitted on 11/23/2023   11/06/2023 1022 Given 500 Units Intracatheter Wilson, Carrie E, RN      sodium chloride  flush (NS) 0.9 % injection 10 mL     Date Action Dose Route User   Discharged on 11/23/2023   Admitted on 11/23/2023   11/06/2023 9171 Given 10 mL Intracatheter Sherryle Hadassah CROME, LPN      sodium chloride  flush (NS) 0.9 % injection 10  mL     Date Action Dose Route User   Discharged on 11/23/2023   Admitted on 11/23/2023   11/06/2023 1021 Given 10 mL Intravenous Tanda Elenor BRAVO, RN          Latest Ref Rng & Units 10/14/2023    8:29 AM  PFT Results  FVC-Pre L 1.28   FVC-Predicted Pre % 44   FVC-Post L 1.29   FVC-Predicted Post % 44   Pre FEV1/FVC % % 58   Post FEV1/FCV % % 56   FEV1-Pre L 0.74   FEV1-Predicted Pre % 33   FEV1-Post L 0.73   DLCO uncorrected ml/min/mmHg 6.31   DLCO UNC% % 35   DLCO corrected ml/min/mmHg 7.13   DLCO COR %Predicted % 40   DLVA Predicted % 83   TLC L 3.43   TLC % Predicted % 77   RV % Predicted % 118     No results found for: NITRICOXIDE      Assessment & Plan:   COPD, very severe (HCC) Very severe COPD with high symptom burden that is multifactorial. Appears to be having a mild exacerbation with associated bronchospasm and persistent dyspnea. No increased sputum production or purulent sputum. On exam, effusions seem stable. Will recheck CXR today and determine if repeat thora or consideration of PleurX is necessary. Will treat her with repeat prednisone  taper as she received benefit from use previously. Initiate inhaled phosphodiesterase inhibitor therapy with  Ohtuvayre . Side effect profile reviewed. Patient enrollment completed. Action plan in place.   Patient Instructions  Continue Breztri  2 puffs Twice daily. Brush tongue and rinse mouth afterwards Continue Albuterol  inhaler 2 puffs or 3 mL neb every 6 hours as needed for shortness of breath or wheezing. Notify if symptoms persist despite rescue inhaler/neb use.   -Start Ohtuvayre  2.5 mL neb Twice daily. Monitor for any mood changes and notify if these occur. This is an additional maintenance medication that you will use twice daily, regardless of how you feel. Not meant for emergency use -Prednisone  taper. 4 tabs for 3 days, then 3 tabs for 3 days, 2 tabs for 3 days, then 1 tab for 3 days, then stop. Take in AM with food   Chest x ray today Will decide if we need to repeat a thoracentesis or decide on PleurX drain placement, if the fluid has re-accumulated    Follow up in 4-6 weeks with Dr. Shelah or Izetta Malachy PIETY. If symptoms do not improve or worsen, please contact office for sooner follow up or seek emergency care.    Bilateral pleural effusion Right thoracentesis 5/22 with 500 mL clear yellow fluid. Cultures/cytology not sent. She has bilateral effusions. Appear stable on exam today. Will check CXR. If she has recurrence, may need repeat thora or consider PleurX.   Adenocarcinoma of right lung, stage 4 (HCC) Prior concurrent chemoradiation and Keytruda . Under active surveillance. Last scan was stable aside from increased effusion. See above. Will need to closely monitor off therapy. No evidence of progression of ground glass opacities at this time. Follow up with oncology as scheduled.     Advised if symptoms do not improve or worsen, to please contact office for sooner follow up or seek emergency care.   I spent 35 minutes of dedicated to the care of this patient on the date of this encounter to include pre-visit review of records, face-to-face time with the patient discussing conditions  above, post visit ordering of testing, clinical documentation with the electronic health record,  making appropriate referrals as documented, and communicating necessary findings to members of the patients care team.  Comer LULLA Rouleau, NP 12/19/2023  Pt aware and understands NP's role.

## 2023-12-19 NOTE — Patient Instructions (Addendum)
 Continue Breztri  2 puffs Twice daily. Brush tongue and rinse mouth afterwards Continue Albuterol  inhaler 2 puffs or 3 mL neb every 6 hours as needed for shortness of breath or wheezing. Notify if symptoms persist despite rescue inhaler/neb use.   -Start Ohtuvayre  2.5 mL neb Twice daily. Monitor for any mood changes and notify if these occur. This is an additional maintenance medication that you will use twice daily, regardless of how you feel. Not meant for emergency use -Prednisone  taper. 4 tabs for 3 days, then 3 tabs for 3 days, 2 tabs for 3 days, then 1 tab for 3 days, then stop. Take in AM with food   Chest x ray today Will decide if we need to repeat a thoracentesis or decide on PleurX drain placement, if the fluid has re-accumulated    Follow up in 4-6 weeks with Dr. Shelah or Izetta Malachy PIETY. If symptoms do not improve or worsen, please contact office for sooner follow up or seek emergency care.

## 2023-12-23 ENCOUNTER — Other Ambulatory Visit: Payer: Self-pay | Admitting: Internal Medicine

## 2023-12-23 DIAGNOSIS — C3401 Malignant neoplasm of right main bronchus: Secondary | ICD-10-CM

## 2023-12-26 ENCOUNTER — Telehealth: Payer: Self-pay | Admitting: Medical Oncology

## 2023-12-26 ENCOUNTER — Other Ambulatory Visit: Payer: Self-pay

## 2023-12-26 ENCOUNTER — Emergency Department (HOSPITAL_COMMUNITY)

## 2023-12-26 ENCOUNTER — Ambulatory Visit: Payer: Self-pay | Admitting: Emergency Medicine

## 2023-12-26 ENCOUNTER — Encounter (HOSPITAL_COMMUNITY): Payer: Self-pay

## 2023-12-26 ENCOUNTER — Other Ambulatory Visit

## 2023-12-26 ENCOUNTER — Emergency Department (HOSPITAL_COMMUNITY)
Admission: EM | Admit: 2023-12-26 | Discharge: 2023-12-26 | Disposition: A | Discharge status: Home / Self Care | Source: Ambulatory Visit | Attending: Emergency Medicine | Admitting: Emergency Medicine

## 2023-12-26 DIAGNOSIS — J441 Chronic obstructive pulmonary disease with (acute) exacerbation: Secondary | ICD-10-CM | POA: Insufficient documentation

## 2023-12-26 DIAGNOSIS — R188 Other ascites: Secondary | ICD-10-CM | POA: Diagnosis not present

## 2023-12-26 DIAGNOSIS — Z79899 Other long term (current) drug therapy: Secondary | ICD-10-CM | POA: Diagnosis not present

## 2023-12-26 DIAGNOSIS — Z7951 Long term (current) use of inhaled steroids: Secondary | ICD-10-CM | POA: Insufficient documentation

## 2023-12-26 DIAGNOSIS — Z85118 Personal history of other malignant neoplasm of bronchus and lung: Secondary | ICD-10-CM | POA: Insufficient documentation

## 2023-12-26 DIAGNOSIS — J90 Pleural effusion, not elsewhere classified: Secondary | ICD-10-CM | POA: Diagnosis not present

## 2023-12-26 DIAGNOSIS — R0609 Other forms of dyspnea: Secondary | ICD-10-CM | POA: Diagnosis not present

## 2023-12-26 DIAGNOSIS — Z7901 Long term (current) use of anticoagulants: Secondary | ICD-10-CM | POA: Insufficient documentation

## 2023-12-26 DIAGNOSIS — J9811 Atelectasis: Secondary | ICD-10-CM | POA: Diagnosis not present

## 2023-12-26 DIAGNOSIS — R000 Tachycardia, unspecified: Secondary | ICD-10-CM | POA: Insufficient documentation

## 2023-12-26 DIAGNOSIS — R0602 Shortness of breath: Secondary | ICD-10-CM | POA: Diagnosis not present

## 2023-12-26 LAB — CBC
HCT: 35.6 % — ABNORMAL LOW (ref 36.0–46.0)
Hemoglobin: 11.2 g/dL — ABNORMAL LOW (ref 12.0–15.0)
MCH: 30.2 pg (ref 26.0–34.0)
MCHC: 31.5 g/dL (ref 30.0–36.0)
MCV: 96 fL (ref 80.0–100.0)
Platelets: 320 K/uL (ref 150–400)
RBC: 3.71 MIL/uL — ABNORMAL LOW (ref 3.87–5.11)
RDW: 14.6 % (ref 11.5–15.5)
WBC: 9.9 K/uL (ref 4.0–10.5)
nRBC: 0 % (ref 0.0–0.2)

## 2023-12-26 LAB — COMPREHENSIVE METABOLIC PANEL WITH GFR
ALT: 22 U/L (ref 0–44)
AST: 31 U/L (ref 15–41)
Albumin: 2.6 g/dL — ABNORMAL LOW (ref 3.5–5.0)
Alkaline Phosphatase: 83 U/L (ref 38–126)
Anion gap: 7 (ref 5–15)
BUN: 29 mg/dL — ABNORMAL HIGH (ref 6–20)
CO2: 24 mmol/L (ref 22–32)
Calcium: 8.6 mg/dL — ABNORMAL LOW (ref 8.9–10.3)
Chloride: 106 mmol/L (ref 98–111)
Creatinine, Ser: 1.11 mg/dL — ABNORMAL HIGH (ref 0.44–1.00)
GFR, Estimated: 58 mL/min — ABNORMAL LOW (ref >60)
Glucose, Bld: 113 mg/dL — ABNORMAL HIGH (ref 70–99)
Potassium: 4.6 mmol/L (ref 3.5–5.1)
Sodium: 137 mmol/L (ref 135–145)
Total Bilirubin: 0.4 mg/dL (ref 0.0–1.2)
Total Protein: 6.4 g/dL — ABNORMAL LOW (ref 6.5–8.1)

## 2023-12-26 MED ORDER — AZITHROMYCIN 250 MG PO TABS: 250.0000 mg | ORAL_TABLET | Freq: Every day | ORAL | 0 refills | Status: DC

## 2023-12-26 MED ORDER — IPRATROPIUM-ALBUTEROL 0.5-2.5 (3) MG/3ML IN SOLN
3.0000 mL | Freq: Once | RESPIRATORY_TRACT | Status: AC
  Administered 2023-12-26: 3 mL via RESPIRATORY_TRACT
  Filled 2023-12-26: qty 3

## 2023-12-26 MED ORDER — METHYLPREDNISOLONE SODIUM SUCC 125 MG IJ SOLR
125.0000 mg | Freq: Once | INTRAMUSCULAR | Status: AC
  Administered 2023-12-26: 125 mg via INTRAVENOUS
  Filled 2023-12-26: qty 2

## 2023-12-26 MED ORDER — PREDNISONE 20 MG PO TABS: 60.0000 mg | ORAL_TABLET | Freq: Every day | ORAL | 0 refills | Status: AC

## 2023-12-26 NOTE — Discharge Instructions (Signed)
 Take next dose of your prednisone  tomorrow.  Take your first dose of antibiotic tonight.  Use albuterol  inhaler 4 puffs every 4-6 hours as needed.  Follow-up with your primary care doctor.  Return if symptoms worsen.

## 2023-12-26 NOTE — ED Triage Notes (Signed)
 Pt presents to ED from home C/O exertional SOB, stating, I just came to get some fluid drained off my lungs.

## 2023-12-26 NOTE — Telephone Encounter (Signed)
 Patient reports worsening shortness of breath today since August 8th. She describes today's dyspnea as moderate and states it has increased compared to previous days and increases with minimal exertion.   She is  audibly wheezing and reports  intermittent episodes of dizziness, which she associates with a sensation of "fluid in her lungs." Patient confirms use of prescribed inhalers and nebulizer without significant relief.   Message sent to Dr. Shelah.

## 2023-12-26 NOTE — Telephone Encounter (Signed)
 With it being Friday afternoon already, I recommend she go to the ER for further evaluation and we can try to get a thoracentesis done today so she can get relief for the weekend.   I am working at [employer] and happy to see her if she comes to the ER here.  Thanks, JD

## 2023-12-26 NOTE — Consult Note (Signed)
 NAME:  April Walsh, MRN:  [id number], DOB:  Jun 24, 1965, LOS: 0 ADMISSION DATE:  12/26/2023, CONSULTATION DATE:  12/26/23 REFERRING MD:  EDP CHIEF COMPLAINT:  Shortness of breath   History of Present Illness:  April Walsh is a 58 year old woman, former smoker with COPD, Stage IV NSCLC with pleural effusion and ascites who was last seen in pulmonary clinic on 8/8 called in to the office today for progressive dyspnea over this week. Instructed patient to come to the ER for evaluation of thoracentesis. Chest radiograph showed stable right small efussion when compared to 8/8 CXR. Bedside US  performed by ER doc did not show significant ascites. She does report increased cough, wheezing with the dyspnea this week. Denies fevers, chills or sweats. She is on eliquis , last dose taken last night. She is exposed to second hand smoke at home intermittently.  Pertinent  Medical History   Past Medical History:  Diagnosis Date   Anemia    Asthma    Basal cell carcinoma    Bronchitis    Cervical cancer (HCC)    COPD (chronic obstructive pulmonary disease) (HCC)    early stages of COPD   Headache    HLD (hyperlipidemia)    Lung cancer (HCC) 08/13/2021   Significant Hospital Events: Including procedures, antibiotic start and stop dates in addition to other pertinent events     Interim History / Subjective:  As above  Objective    Blood pressure (!) 145/80, pulse (!) 124, temperature 98.4 F (36.9 C), temperature source Oral, resp. rate 20, SpO2 94%.       No intake or output data in the 24 hours ending 12/26/23 1520 There were no vitals filed for this visit.  Examination: General: middle age woman, no distress HENT: [place]/AT, moist mucous membranes Lungs: diffuse wheezing Cardiovascular: tachycardic, no mumurs Abdomen: soft, non-tender, non-distended Extremities: warm, no edema Neuro: alert, oriented x 3 GU: n/a  Resolved problem list   Assessment and Plan   COPD with acute  exacerbation Stage IV NSCLC Small Right Pleural Effusion Ascites  Plan - discussed with ER team for steroids and starting Zpak antibiotic along with receiving nebulizer treatment in the ER - Plan to complete 5 days of 40mg  prednisone  and 5 days of Azithromycin  - No need for paracentesis or thoracentesis based on US  imaging and Chest x-ray today - She is to continue breztri  inhaler 2 puffs twice daily - Use albuterol  inhaler or nebulizer every 4-6 hours as needed - Recently prescribed Ohtuvayre  nebulizer treatments, start when approved and received. - Follow up as scheduled in pulmonary clinic  Labs   CBC: No results for input(s): WBC, NEUTROABS, HGB, HCT, MCV, PLT in the last 168 hours.  Basic Metabolic Panel: No results for input(s): NA, K, CL, CO2, GLUCOSE, BUN, CREATININE, CALCIUM , MG, PHOS in the last 168 hours. GFR: CrCl cannot be calculated (Patient's most recent lab result is older than the maximum 21 days allowed.). No results for input(s): PROCALCITON, WBC, LATICACIDVEN in the last 168 hours.  Liver Function Tests: No results for input(s): AST, ALT, ALKPHOS, BILITOT, PROT, ALBUMIN in the last 168 hours. No results for input(s): LIPASE, AMYLASE in the last 168 hours. No results for input(s): AMMONIA in the last 168 hours.  ABG    Component Value Date/Time   TCO2 22 09/11/2021 1933     Coagulation Profile: No results for input(s): INR, PROTIME in the last 168 hours.  Cardiac Enzymes: No results for input(s): CKTOTAL, CKMB, CKMBINDEX, TROPONINI  in the last 168 hours.  HbA1C: Hgb A1c MFr Bld  Date/Time Value Ref Range Status  04/22/2023 01:31 PM 5.0 4.8 - 5.6 % Final    Comment:             Prediabetes: 5.7 - 6.4          Diabetes: >6.4          Glycemic control for adults with diabetes: <7.0   09/12/2021 03:40 AM 4.7 (L) 4.8 - 5.6 % Final    Comment:    (NOTE) Pre diabetes:           5.7%-6.4%  Diabetes:              >6.4%  Glycemic control for   <7.0% adults with diabetes     CBG: No results for input(s): GLUCAP in the last 168 hours.  Review of Systems:   Review of Systems  Constitutional:  Negative for chills, fever, malaise/fatigue and weight loss.  HENT:  Negative for congestion, sinus pain and sore throat.   Eyes: Negative.   Respiratory:  Positive for cough, shortness of breath and wheezing. Negative for hemoptysis and sputum production.   Cardiovascular:  Negative for chest pain, palpitations, orthopnea, claudication and leg swelling.  Gastrointestinal:  Negative for abdominal pain, heartburn, nausea and vomiting.  Genitourinary: Negative.   Musculoskeletal:  Negative for joint pain and myalgias.  Skin:  Negative for rash.  Neurological:  Negative for weakness.  Endo/Heme/Allergies: Negative.   Psychiatric/Behavioral: Negative.     Past Medical History:  She,  has a past medical history of Anemia, Asthma, Basal cell carcinoma, Bronchitis, Cervical cancer (HCC), COPD (chronic obstructive pulmonary disease) (HCC), Headache, HLD (hyperlipidemia), and Lung cancer (HCC) (08/13/2021).   Surgical History:   Past Surgical History:  Procedure Laterality Date   APPENDECTOMY     1996   BREAST BIOPSY Right    2010-2015   BRONCHIAL BIOPSY  08/13/2021   Procedure: BRONCHIAL BIOPSIES;  Surgeon: Shelah Lamar RAMAN, MD;  Location: [facility] ENDOSCOPY;  Service: Pulmonary;;   BRONCHIAL BRUSHINGS  08/13/2021   Procedure: BRONCHIAL BRUSHINGS;  Surgeon: Shelah Lamar RAMAN, MD;  Location[facility] ENDOSCOPY;  Service: Pulmonary;;   BRONCHIAL NEEDLE ASPIRATION BIOPSY  08/13/2021   Procedure: BRONCHIAL NEEDLE ASPIRATION BIOPSIES;  Surgeon: Shelah Lamar RAMAN, MD;  Location: [facility] ENDOSCOPY;  Service: Pulmonary;;   CESAREAN SECTION     1984, 1987   FOOT SURGERY Right    2 pins and screw 1999   FOOT SURGERY Left 06/21/2021   screw and 2 pins   HEMOSTASIS CONTROL  08/13/2021   Procedure:  HEMOSTASIS CONTROL;  Surgeon: Shelah Lamar RAMAN, MD;  Location[facility] ENDOSCOPY;  Service: Pulmonary;;   IR IMAGING GUIDED PORT INSERTION  11/26/2021   IR PARACENTESIS  12/01/2023   MELANOMA EXCISION Left    basal cell melanoma 2011   PARTIAL HYSTERECTOMY     1994   TUBAL LIGATION     1987   VIDEO BRONCHOSCOPY  08/13/2021   Procedure: VIDEO BRONCHOSCOPY WITHOUT FLUORO;  Surgeon: Shelah Lamar RAMAN, MD;  Location: [facility] ENDOSCOPY;  Service: Pulmonary;;   VIDEO BRONCHOSCOPY WITH ENDOBRONCHIAL ULTRASOUND N/A 08/13/2021   Procedure: VIDEO BRONCHOSCOPY WITH ENDOBRONCHIAL ULTRASOUND;  Surgeon: Shelah Lamar RAMAN, MD;  Location: [facility] ENDOSCOPY;  Service: Pulmonary;  Laterality: N/A;     Social History:   reports that she quit smoking about 2 years ago. Her smoking use included cigarettes. She has a 16 pack-year smoking history. She has been exposed  to tobacco smoke. She has never used smokeless tobacco. She reports that she does not drink alcohol and does not use drugs.   Family History:  Her family history includes Brain cancer in her maternal aunt; Bronchitis in her son; Diabetes in her mother; Heart failure in her mother; Hyperlipidemia in her mother; Hypertension in her mother; Lung cancer in her cousin, paternal uncle, and paternal uncle; Ovarian cancer in her maternal grandmother; Seizures in her daughter; Throat cancer in her maternal aunt. There is no history of Colon cancer, Colon polyps, Esophageal cancer, Rectal cancer, or Stomach cancer.   Allergies Allergies  Allergen Reactions   Remeron  [Mirtazapine ] Other (See Comments)    States she slept for 5 days     Home Medications  Prior to Admission medications   Medication Sig Start Date End Date Taking? Authorizing Provider  albuterol  (ACCUNEB ) 0.63 MG/3ML nebulizer solution Take 3 mLs (0.63 mg total) by nebulization every 4 (four) hours as needed for wheezing or shortness of breath. 06/25/23   Shelah Lamar RAMAN, MD  albuterol  (VENTOLIN  HFA) 108 [phone] Base)  MCG/ACT inhaler Inhale 2 puffs into the lungs every 6 (six) hours as needed for wheezing or shortness of breath. 07/18/23   Shelah Lamar RAMAN, MD  apixaban  (ELIQUIS ) 2.5 MG TABS tablet Take 1 tablet (2.5 mg total) by mouth 2 (two) times daily. 09/12/21   Bryn Bernardino NOVAK, MD  atorvastatin  (LIPITOR) 80 MG tablet TAKE 1 TABLET BY MOUTH EVERY DAY Patient not taking: Reported on 12/19/2023 08/27/22   Rosemarie Eather RAMAN, MD  Budeson-Glycopyrrol-Formoterol  (BREZTRI  AEROSPHERE) 160-9-4.8 MCG/ACT AERO Inhale 2 puffs into the lungs in the morning and at bedtime. 07/17/23   Shelah Lamar RAMAN, MD  Ensifentrine  (OHTUVAYRE ) 3 MG/2.5ML SUSP Inhale 2.5 mLs into the lungs 2 (two) times daily. 12/19/23   Cobb, Comer GAILS, NP  folic acid  (FOLVITE ) 1 MG tablet TAKE 1 TABLET BY MOUTH EVERY DAY 09/22/23   Sherrod Sherrod, MD  lidocaine -prilocaine  (EMLA ) cream APPLY TOPICALLY 1 APPLICATION AS NEEDED Patient not taking: Reported on 12/19/2023 07/10/23   Heilingoetter, Cassandra L, PA-C  Na Sulfate-K Sulfate-Mg Sulfate concentrate (SUPREP) 17.5-3.13-1.6 GM/177ML SOLN TAKE 1 KIT (354 MLS TOTAL) BY MOUTH ONCE FOR 1 DOSE. Patient not taking: Reported on 12/19/2023 07/28/23   [provider]  pantoprazole  (PROTONIX ) 40 MG tablet Take 1 tablet (40 mg total) by mouth 2 (two) times daily. Twice day for 6 weeks then 40 mg daily. Patient not taking: Reported on 12/19/2023 09/11/23   Cirigliano, Vito V, DO  predniSONE  (DELTASONE ) 10 MG tablet 4 tabs for 3 days, then 3 tabs for 3 days, 2 tabs for 3 days, then 1 tab for 3 days, then stop 12/19/23   Cobb, Comer GAILS, NP  prochlorperazine  (COMPAZINE ) 10 MG tablet Take 1 tablet (10 mg total) by mouth every 6 (six) hours as needed. Patient not taking: Reported on 12/19/2023 01/08/23   Heilingoetter, Cassandra L, PA-C  sucralfate  (CARAFATE ) 1 g tablet Crush 1 tablet and dissolve in 10ml of warm water, mix well to create slurry and drink every 4 times daily for 4 weeks. Patient not taking: Reported on 12/19/2023  09/15/23   San Sandor GAILS, DO     Critical care time: n/a    Dorn Chill, MD [facility] Office: [phone]   See Amion for personal pager PCCM on call pager [phone] until 7pm. Please call Elink 7p-7a. [phone]

## 2023-12-26 NOTE — Telephone Encounter (Signed)
 I called and spoke with the pt and notified of response per JD. She verbalized understanding and will go to ED at [facility].

## 2023-12-26 NOTE — Telephone Encounter (Signed)
 FYI Only or Action Required?: Action required by provider: update on patient condition and requesting thoracentesis.  Patient is followed in Pulmonology for COPD/Pleural effusion, last seen on 12/19/2023 by Malachy Comer GAILS, NP.  Called Nurse Triage reporting Breathing Problem.  Symptoms began several days ago.  Interventions attempted: Maintenance inhaler and Nebulizer treatments.  Symptoms are: gradually worsening.  Triage Disposition: Call Specialist Now, Go to ED Now (Notify PCP)  Patient/caregiver understands and will follow disposition?: No, wishes to speak with PCP      Copied from CRM #[id number]. Topic: Clinical - Red Word Triage >> Dec 26, 2023 11:51 AM Shona RAMAN wrote: Red Word that prompted transfer to Nurse Triage: difficulty breathing. Reason for Disposition  [1] MODERATE difficulty breathing (e.g., speaks in phrases, SOB even at rest, pulse 100-120) AND [2] NEW-onset or WORSE than normal  Answer Assessment - Initial Assessment Questions E2C2 Pulmonary Triage - Initial Assessment Questions Chief Complaint (e.g., cough, sob, wheezing, fever, chills, sweat or additional symptoms) *Go to specific symptom protocol after initial questions. SOB, productive white cough Recent AV with Cobb, NP - endorses fluid in lungs - reports typically calls cancer center to get thoracentesis. Cancer center MD is out and was advised to call [facility]  How long have symptoms been present? X 3 day  Have you tested for COVID or Flu? Note: If not, ask patient if a home test can be taken. If so, instruct patient to call back for positive results. No  MEDICINES:   Have you used any OTC meds to help with symptoms? No If yes, ask What medications? N/a  Have you used your inhalers/maintenance medication? Yes If yes, What medications? BREZTRI  Albuterol  - provides some relief  If inhaler, ask How many puffs and how often? Note: Review instructions on medication in the chart. As  prescribed  OXYGEN: Do you wear supplemental oxygen? No If yes, How many liters are you supposed to use? N/a  Do you monitor your oxygen levels? No If yes, What is your reading (oxygen level) today? N/a  What is your usual oxygen saturation reading?  (Note: Pulmonary O2 sats should be 90% or greater) N/a       1. RESPIRATORY STATUS: Describe your breathing? (e.g., wheezing, shortness of breath, unable to speak, severe coughing)      See above 2. ONSET: When did this breathing problem begin?      See above 3. PATTERN Does the difficult breathing come and go, or has it been constant since it started?      intermittent 4. SEVERITY: How bad is your breathing? (e.g., mild, moderate, severe)      Mild with exertion - Triager does not appreciate audible SOB/wheezing during call. Pt is speaking in full sentences.  5. RECURRENT SYMPTOM: Have you had difficulty breathing before? If Yes, ask: When was the last time? and What happened that time?      Yes, a month ago 6. CARDIAC HISTORY: Do you have any history of heart disease? (e.g., heart attack, angina, bypass surgery, angioplasty)      denies 7. LUNG HISTORY: Do you have any history of lung disease?  (e.g., pulmonary embolus, asthma, emphysema)     COPD 8. CAUSE: What do you think is causing the breathing problem?      Fluid in lungs - endorses that she needs fluid drained. Pt reports she typically has her Oncologist perform, but provider is unavailable and oncologist RN advised her to contact [facility] for assistance. 9. OTHER SYMPTOMS: Do  you have any other symptoms? (e.g., chest pain, cough, dizziness, fever, runny nose)     denies 10. O2 SATURATION MONITOR:  Do you use an oxygen saturation monitor (pulse oximeter) at home? If Yes, ask: What is your reading (oxygen level) today? What is your usual oxygen saturation reading? (e.g., 95%)       N/a 11. PREGNANCY: Is there any chance you are  pregnant? When was your last menstrual period?       N/a 12. TRAVEL: Have you traveled out of the country in the last month? (e.g., travel history, exposures)       N/a    Triager attempted to call  [facility], but clinic has early closures on Friday.   Triager will forward encounter for Dr. Shelah 's office to review and advise. Patient verbalized understanding and is expecting call back from office for next steps, if any. Triager also advised that if pt does not hear back from office, to call oncology RN back and advise that [facility] has early closures on Friday so procedure cannot be scheduled. Patient verbalized understanding.  Protocols used: Breathing Difficulty-A-AH

## 2023-12-26 NOTE — ED Notes (Signed)
 Discharge instructions, medications, and follow up care reviewed with and provided to pt. Pt denies any further questions, and has verbalized understanding.

## 2023-12-26 NOTE — ED Provider Notes (Signed)
 [place] EMERGENCY DEPARTMENT AT [facility] Provider Note   CSN: [id number] Arrival date & time: 12/26/23  1423     Patient presents with: Shortness of Breath   April Walsh is a 58 y.o. female.   Patient here with ongoing shortness of breath.  History of COPD lung cancer.  She has required thoracentesis and paracentesis in the past.  She has been using her inhaler with a little bit of relief.  She was instructed by her pulmonologist to come in for evaluation for maybe a thoracentesis as their office is closed today.  She is on anticoagulation Eliquis .  She does not smoke anymore but she does live in a house where someone smokes.  She denies any cough sputum production or fever.  The history is provided by the patient.       Prior to Admission medications   Medication Sig Start Date End Date Taking? Authorizing Provider  azithromycin  (ZITHROMAX ) 250 MG tablet Take 1 tablet (250 mg total) by mouth daily. Take first 2 tablets together, then 1 every day until finished. 12/26/23  Yes Kansas Spainhower, DO  predniSONE  (DELTASONE ) 20 MG tablet Take 3 tablets (60 mg total) by mouth daily for 4 days. 12/26/23 12/30/23 Yes Cayson Kalb, DO  albuterol  (ACCUNEB ) 0.63 MG/3ML nebulizer solution Take 3 mLs (0.63 mg total) by nebulization every 4 (four) hours as needed for wheezing or shortness of breath. 06/25/23   Byrum, Robert S, MD  albuterol  (VENTOLIN  HFA) 108 [phone] Base) MCG/ACT inhaler Inhale 2 puffs into the lungs every 6 (six) hours as needed for wheezing or shortness of breath. 07/18/23   Shelah Lamar RAMAN, MD  apixaban  (ELIQUIS ) 2.5 MG TABS tablet Take 1 tablet (2.5 mg total) by mouth 2 (two) times daily. 09/12/21   Bryn Bernardino NOVAK, MD  atorvastatin  (LIPITOR) 80 MG tablet TAKE 1 TABLET BY MOUTH EVERY DAY Patient not taking: Reported on 12/19/2023 08/27/22   Rosemarie Eather RAMAN, MD  Budeson-Glycopyrrol-Formoterol  (BREZTRI  AEROSPHERE) 160-9-4.8 MCG/ACT AERO Inhale 2 puffs into the lungs in the  morning and at bedtime. 07/17/23   Shelah Lamar RAMAN, MD  Ensifentrine  (OHTUVAYRE ) 3 MG/2.5ML SUSP Inhale 2.5 mLs into the lungs 2 (two) times daily. 12/19/23   Cobb, Comer GAILS, NP  folic acid  (FOLVITE ) 1 MG tablet TAKE 1 TABLET BY MOUTH EVERY DAY 09/22/23   Sherrod Sherrod, MD  lidocaine -prilocaine  (EMLA ) cream APPLY TOPICALLY 1 APPLICATION AS NEEDED Patient not taking: Reported on 12/19/2023 07/10/23   Heilingoetter, Cassandra L, PA-C  Na Sulfate-K Sulfate-Mg Sulfate concentrate (SUPREP) 17.5-3.13-1.6 GM/177ML SOLN TAKE 1 KIT (354 MLS TOTAL) BY MOUTH ONCE FOR 1 DOSE. Patient not taking: Reported on 12/19/2023 07/28/23   [provider]  pantoprazole  (PROTONIX ) 40 MG tablet Take 1 tablet (40 mg total) by mouth 2 (two) times daily. Twice day for 6 weeks then 40 mg daily. Patient not taking: Reported on 12/19/2023 09/11/23   Cirigliano, Vito V, DO  prochlorperazine  (COMPAZINE ) 10 MG tablet Take 1 tablet (10 mg total) by mouth every 6 (six) hours as needed. Patient not taking: Reported on 12/19/2023 01/08/23   Heilingoetter, Cassandra L, PA-C  sucralfate  (CARAFATE ) 1 g tablet Crush 1 tablet and dissolve in 10ml of warm water, mix well to create slurry and drink every 4 times daily for 4 weeks. Patient not taking: Reported on 12/19/2023 09/15/23   Cirigliano, Vito V, DO    Allergies: Remeron  [mirtazapine ]    Review of Systems  Updated Vital Signs BP 118/80 (BP Location: Right  Arm)   Pulse (!) 119   Temp 98.4 F (36.9 C) (Oral)   Resp (!) 23   SpO2 98%   Physical Exam Vitals and nursing note reviewed.  Constitutional:      General: She is not in acute distress.    Appearance: She is well-developed.  HENT:     Head: Normocephalic and atraumatic.     Mouth/Throat:     Mouth: Mucous membranes are moist.  Eyes:     Extraocular Movements: Extraocular movements intact.     Conjunctiva/sclera: Conjunctivae normal.     Pupils: Pupils are equal, round, and reactive to light.  Cardiovascular:     Rate  and Rhythm: Normal rate and regular rhythm. No extrasystoles are present.    Pulses: No decreased pulses.     Heart sounds: No murmur heard. Pulmonary:     Effort: Pulmonary effort is normal. No respiratory distress.     Breath sounds: Wheezing present.  Abdominal:     Palpations: Abdomen is soft.     Tenderness: There is no abdominal tenderness.  Musculoskeletal:        General: No swelling.     Cervical back: Normal range of motion and neck supple.     Right lower leg: No edema.     Left lower leg: No edema.  Skin:    General: Skin is warm and dry.     Capillary Refill: Capillary refill takes less than 2 seconds.  Neurological:     Mental Status: She is alert.  Psychiatric:        Mood and Affect: Mood normal.     (all labs ordered are listed, but only abnormal results are displayed) Labs Reviewed  CBC - Abnormal; Notable for the following components:      Result Value   RBC 3.71 (*)    Hemoglobin 11.2 (*)    HCT 35.6 (*)    All other components within normal limits  COMPREHENSIVE METABOLIC PANEL WITH GFR - Abnormal; Notable for the following components:   Glucose, Bld 113 (*)    BUN 29 (*)    Creatinine, Ser 1.11 (*)    Calcium  8.6 (*)    Total Protein 6.4 (*)    Albumin 2.6 (*)    GFR, Estimated 58 (*)    All other components within normal limits    EKG: EKG Interpretation Date/Time:  Friday December 26 2023 15:06:30 EDT Ventricular Rate:  112 PR Interval:  181 QRS Duration:  100 QT Interval:  317 QTC Calculation: 433 R Axis:   99  Text Interpretation: Sinus tachycardia Confirmed by Ruthe Cornet [id number]) on 12/26/2023 4:01:49 PM  Radiology: [facility] Chest 2 View Result Date: 12/26/2023 CLINICAL DATA:  Dyspnea on exertion. EXAM: CHEST - 2 VIEW COMPARISON:  December 19, 2023. FINDINGS: The heart size and mediastinal contours are within normal limits. Right perihilar densities are noted suggesting acute inflammation or possibly scarring. Right internal jugular  Port-A-Cath is unchanged. Minimal bibasilar subsegmental atelectasis is noted with small pleural effusions. The visualized skeletal structures are unremarkable. IMPRESSION: Minimal bibasilar subsegmental atelectasis with small pleural effusions. Right perihilar densities are noted suggesting acute inflammation or possibly scarring. Electronically Signed   By: Lynwood Landy Raddle M.D.   On: 12/26/2023 15:50     Procedures   Medications Ordered in the ED  ipratropium-albuterol  (DUONEB) 0.5-2.5 (3) MG/3ML nebulizer solution 3 mL (3 mLs Nebulization Given 12/26/23 1532)  methylPREDNISolone  sodium succinate (SOLU-MEDROL ) 125 mg/2 mL injection 125 mg (125 mg  Intravenous Given 12/26/23 1541)                                    Medical Decision Making Amount and/or Complexity of Data Reviewed Labs: ordered.  Risk Prescription drug management.   Darby Fleeman is here shortness of breath.  History of COPD asthma lung cancer.  She has had malignant ascites in the past.  Quick bedside ultrasound pulmonology does not show any major ascites.  She is not having any abdominal tenderness.  Dr. Kara with pulmonology came down to the bedside to evaluate her with me as well and chest x-ray did not show any major signs of volume overload.  Overall we both agree that this seems like most likely more of a reactive airway COPD exacerbation.  Still on anticoagulation and doubt PE.  Not having any chest pain.  EKG shows sinus tachycardia in the low 110s.  Will check basic labs give breathing treatment IV Solu-Medrol  anticipate discharge with antibiotic.  She has a history of non-small cell lung cancer, she is undergo palliative radiotherapy.  On chemotherapy.  She has needed thoracentesis paracentesis sounds like in the past.  Right now I do not think she would be a good candidate for paracentesis and thoracentesis.  This does seem reactive airway related.  She is on anticoagulation.  Is not hypoxic.  No signs of respiratory  distress.  Doubt PE.  Overall lab work for my review and interpretation is reassuring.  Chest x-ray with no major infectious process.  May be some acute inflammation per radiology report.  Overall she is feeling much better after breathing treatment steroids.  Will prescribe Z-Pak prednisone  and have her follow-up with her primary doctors.  Discharge.  This chart was dictated using voice recognition software.  Despite best efforts to proofread,  errors can occur which can change the documentation meaning.      Final diagnoses:  COPD exacerbation [facility])    ED Discharge Orders          Ordered    azithromycin  (ZITHROMAX ) 250 MG tablet  Daily        12/26/23 1647    predniSONE  (DELTASONE ) 20 MG tablet  Daily        12/26/23 1647               Ruthe Cornet, DO 12/26/23 1648

## 2023-12-30 ENCOUNTER — Telehealth: Payer: Self-pay

## 2023-12-30 NOTE — Telephone Encounter (Signed)
 Received Ohtuvayre  new start paperwork. Completed form and faxed with clinicals and insurance card copy to [facility] [employer]   Phone#: [phone] Fax#: [phone]

## 2023-12-31 NOTE — Telephone Encounter (Signed)
 Received fax from [employer] Confirming receipt of Ohtuvayre  enrollment form  Patient ID: [id number]

## 2024-01-01 ENCOUNTER — Ambulatory Visit: Admitting: Internal Medicine

## 2024-01-02 NOTE — Telephone Encounter (Signed)
 Received fax from [employer] with summary of benefits. Referral form for Ohtuvayre  received. Rx will be triaged to [facility].. Once benefits investigation completed, pharmacy will reach out the patient to schedule shipment. If medication is unaffordable, patient will need to express financial hardship to be referred back to [facility] for patient assistance program pre-screening.   Patient ID: [id number] Pharmacy phone: [phone] [facility] Phone#: [phone]

## 2024-01-08 ENCOUNTER — Telehealth (HOSPITAL_BASED_OUTPATIENT_CLINIC_OR_DEPARTMENT_OTHER): Payer: Self-pay

## 2024-01-08 NOTE — Telephone Encounter (Signed)
 Copied from CRM #[id number]. Topic: Clinical - Medication Prior Auth >> Jan 08, 2024 11:41 AM Leila C wrote: Reason for CRM: Murray from [facility] [phone] ext [phone] states PA Ensifentrine  (OHTUVAYRE ) 3 MG/2.5ML SUSP, Covermymed key #[id number].

## 2024-01-08 NOTE — Telephone Encounter (Unsigned)
 Copied from CRM [id number]. Topic: Clinical - Prescription Issue >> Jan 08, 2024  8:29 AM Isabell A wrote: Reason for CRM: [facility] and told her she needs to tell her provider to explain why she needs Ohtuvayre  to her insurance company - number provided for provider to call is [phone].

## 2024-01-08 NOTE — Telephone Encounter (Signed)
 Received fax from [facility] that prior authorization for Ohtuvayre  has been initiated. Submitted a Prior Authorization request to [facility] for OHTUVAYRE  via CoverMyMeds. Will update once we receive a response.  Key: [id number]

## 2024-01-14 ENCOUNTER — Telehealth: Payer: Self-pay | Admitting: *Deleted

## 2024-01-14 NOTE — Telephone Encounter (Signed)
 Please assist with Ohtuvayre  PA that was submitted.

## 2024-01-15 ENCOUNTER — Telehealth: Payer: Self-pay

## 2024-01-15 NOTE — Telephone Encounter (Signed)
 Received a fax regarding Prior Authorization from [facility] for OHTUVAYRE . Authorization has been DENIED because:  .  Phone# [phone]

## 2024-01-15 NOTE — Telephone Encounter (Signed)
 Copied from CRM [id number]. Topic: Clinical - Prescription Issue >> Jan 15, 2024 10:43 AM Rozanna MATSU wrote: Reason for CRM: PER PADRO [phone] EXT [phone] WITH [facility] Ensifentrine  (OHTUVAYRE ) 3 MG/2.5ML SUSP.STATED INSURANCE CLAIM REQ PRIOR AUTHORIZATION HERE IS THE COVER MY MEDS KEY [id number] TO USE AND IT WAS RENEWED AND FAXED TO OFFICE. JUST NEED TO FILL OUT THE QUESTIONNAIRE AND WHATEVER ELSE IS ON THE PA. STATED THE MED IS ON HOLD AS OF RIGHT NOW.  Routing to PA team

## 2024-01-15 NOTE — Telephone Encounter (Signed)
 Received a fax regarding Prior Authorization from [facility] for OHTUVAYRE . Authorization has been DENIED because patient needs to have previous trial and failure with SABA or SAMA, and LABA or LAMA. Pt must also not have history of mental health issues including depression and/or suicidal behavior.  A request for redetermination has been drafted and will be submitted to [employer], along with supporting documentation proving that pt has longstanding history with SABA and LABA/LAMA, as well as the fact that her history of depression has not had any relevance of note in the last few years.  Case# [id number] Phone# [phone] Fax# [phone]

## 2024-01-16 NOTE — Telephone Encounter (Signed)
 See separate thread [facility] for updates regarding PA for Ohtuvayre .

## 2024-01-21 NOTE — Telephone Encounter (Signed)
 Received notification from [facility] regarding a prior authorization for OHTUVAYRE . Appeal has been APPROVED from 01/08/24 to 01/07/25. Approval letter sent to scan center.  Authorization # [id number] Phone # [phone]   Forwarded a copy of appeal approval letter to [employer] and [facility].

## 2024-01-22 ENCOUNTER — Telehealth: Payer: Self-pay

## 2024-01-22 NOTE — Telephone Encounter (Signed)
 Copied from CRM [id number]. Topic: Clinical - Medication Question >> Jan 21, 2024  5:09 PM Rozanna MATSU wrote: Reason for CRM: [employer] called about the appeal approval letter that was sent today if it can be refaxed stated it is cut off [phone]

## 2024-01-22 NOTE — Telephone Encounter (Signed)
 Received notification via patient call that [employer] requested the appeal approval letter to be resent because it was cut off. Resent approval letter to [phone].

## 2024-01-22 NOTE — Telephone Encounter (Signed)
 Will resend

## 2024-01-23 ENCOUNTER — Other Ambulatory Visit: Payer: Self-pay | Admitting: Internal Medicine

## 2024-01-23 DIAGNOSIS — C3401 Malignant neoplasm of right main bronchus: Secondary | ICD-10-CM

## 2024-01-24 ENCOUNTER — Other Ambulatory Visit: Payer: Self-pay | Admitting: Gastroenterology

## 2024-02-06 ENCOUNTER — Inpatient Hospital Stay: Attending: Radiation Oncology

## 2024-02-06 ENCOUNTER — Ambulatory Visit (HOSPITAL_COMMUNITY)
Admission: RE | Admit: 2024-02-06 | Discharge: 2024-02-06 | Disposition: A | Discharge status: Home / Self Care | Source: Ambulatory Visit | Attending: Internal Medicine | Admitting: Internal Medicine

## 2024-02-06 DIAGNOSIS — C3490 Malignant neoplasm of unspecified part of unspecified bronchus or lung: Secondary | ICD-10-CM | POA: Insufficient documentation

## 2024-02-06 DIAGNOSIS — C7971 Secondary malignant neoplasm of right adrenal gland: Secondary | ICD-10-CM | POA: Diagnosis not present

## 2024-02-06 DIAGNOSIS — C3491 Malignant neoplasm of unspecified part of right bronchus or lung: Secondary | ICD-10-CM | POA: Insufficient documentation

## 2024-02-06 DIAGNOSIS — C7972 Secondary malignant neoplasm of left adrenal gland: Secondary | ICD-10-CM | POA: Insufficient documentation

## 2024-02-06 DIAGNOSIS — Z85118 Personal history of other malignant neoplasm of bronchus and lung: Secondary | ICD-10-CM | POA: Diagnosis not present

## 2024-02-06 DIAGNOSIS — J90 Pleural effusion, not elsewhere classified: Secondary | ICD-10-CM | POA: Diagnosis not present

## 2024-02-06 DIAGNOSIS — N281 Cyst of kidney, acquired: Secondary | ICD-10-CM | POA: Diagnosis not present

## 2024-02-06 LAB — CMP (CANCER CENTER ONLY)
ALT: 7 U/L (ref 0–44)
AST: 14 U/L — ABNORMAL LOW (ref 15–41)
Albumin: 3.4 g/dL — ABNORMAL LOW (ref 3.5–5.0)
Alkaline Phosphatase: 112 U/L (ref 38–126)
Anion gap: 6 (ref 5–15)
BUN: 13 mg/dL (ref 6–20)
CO2: 25 mmol/L (ref 22–32)
Calcium: 8.9 mg/dL (ref 8.9–10.3)
Chloride: 102 mmol/L (ref 98–111)
Creatinine: 0.8 mg/dL (ref 0.44–1.00)
GFR, Estimated: >60 mL/min (ref >60)
Glucose, Bld: 82 mg/dL (ref 70–99)
Potassium: 3.9 mmol/L (ref 3.5–5.1)
Sodium: 133 mmol/L — ABNORMAL LOW (ref 135–145)
Total Bilirubin: 0.3 mg/dL (ref 0.0–1.2)
Total Protein: 7.2 g/dL (ref 6.5–8.1)

## 2024-02-06 LAB — CBC WITH DIFFERENTIAL (CANCER CENTER ONLY)
Abs Immature Granulocytes: 0.06 K/uL (ref 0.00–0.07)
Basophils Absolute: 0.1 K/uL (ref 0.0–0.1)
Basophils Relative: 1 %
Eosinophils Absolute: 0.2 K/uL (ref 0.0–0.5)
Eosinophils Relative: 2 %
HCT: 36.2 % (ref 36.0–46.0)
Hemoglobin: 11.8 g/dL — ABNORMAL LOW (ref 12.0–15.0)
Immature Granulocytes: 1 %
Lymphocytes Relative: 12 %
Lymphs Abs: 1.1 K/uL (ref 0.7–4.0)
MCH: 29.9 pg (ref 26.0–34.0)
MCHC: 32.6 g/dL (ref 30.0–36.0)
MCV: 91.6 fL (ref 80.0–100.0)
Monocytes Absolute: 1.3 K/uL — ABNORMAL HIGH (ref 0.1–1.0)
Monocytes Relative: 14 %
Neutro Abs: 6.5 K/uL (ref 1.7–7.7)
Neutrophils Relative %: 70 %
Platelet Count: 271 K/uL (ref 150–400)
RBC: 3.95 MIL/uL (ref 3.87–5.11)
RDW: 17.3 % — ABNORMAL HIGH (ref 11.5–15.5)
WBC Count: 9.1 K/uL (ref 4.0–10.5)
nRBC: 0 % (ref 0.0–0.2)

## 2024-02-06 LAB — SAMPLE TO BLOOD BANK

## 2024-02-06 MED ORDER — IOHEXOL 300 MG/ML  SOLN
100.0000 mL | Freq: Once | INTRAMUSCULAR | Status: AC | PRN
  Administered 2024-02-06: 100 mL via INTRAVENOUS

## 2024-02-06 MED ORDER — IOHEXOL 9 MG/ML PO SOLN
1000.0000 mL | Freq: Once | ORAL | Status: AC
  Administered 2024-02-06: 1000 mL via ORAL

## 2024-02-06 MED ORDER — HEPARIN SOD (PORK) LOCK FLUSH 100 UNIT/ML IV SOLN: 500.0000 [IU] | Freq: Once | INTRAVENOUS | Status: DC

## 2024-02-12 ENCOUNTER — Inpatient Hospital Stay: Attending: Radiation Oncology | Admitting: Internal Medicine

## 2024-02-12 VITALS — BP 126/78 | HR 102 | Temp 96.8°F | Resp 17 | Ht 60.0 in | Wt 102.9 lb

## 2024-02-12 DIAGNOSIS — C3491 Malignant neoplasm of unspecified part of right bronchus or lung: Secondary | ICD-10-CM | POA: Diagnosis not present

## 2024-02-12 DIAGNOSIS — R0609 Other forms of dyspnea: Secondary | ICD-10-CM | POA: Insufficient documentation

## 2024-02-12 DIAGNOSIS — C7972 Secondary malignant neoplasm of left adrenal gland: Secondary | ICD-10-CM | POA: Diagnosis not present

## 2024-02-12 DIAGNOSIS — J4489 Other specified chronic obstructive pulmonary disease: Secondary | ICD-10-CM | POA: Diagnosis not present

## 2024-02-12 DIAGNOSIS — J8410 Pulmonary fibrosis, unspecified: Secondary | ICD-10-CM | POA: Insufficient documentation

## 2024-02-12 DIAGNOSIS — C3490 Malignant neoplasm of unspecified part of unspecified bronchus or lung: Secondary | ICD-10-CM

## 2024-02-12 DIAGNOSIS — C7971 Secondary malignant neoplasm of right adrenal gland: Secondary | ICD-10-CM | POA: Insufficient documentation

## 2024-02-12 NOTE — Progress Notes (Signed)
 [facility] [phone]   [phone]  OFFICE PROGRESS NOTE  [name], FNP [address] [place] [address]  DIAGNOSIS: Stage IV (T2b, N2, M1 C) non-small cell lung cancer, adenocarcinoma presented with right hilar mass in addition to mediastinal and upper abdominal lymphadenopathy in addition to bilateral adrenal metastases and metastatic disease in the musculature of the lower back.  This was diagnosed in April 2023.  DETECTED ALTERATION(S) / BIOMARKER(S) % CFDNA OR AMPLIFICATION ASSOCIATED FDA-APPROVED THERAPIES CLINICAL TRIAL AVAILABILITY [id number] 0.5% None  Yes [id number] (Splice Site Indel) 0.6% None  Yes  PD-L1 expression 0%  PRIOR THERAPY:  1) Palliative radiotherapy to the right hilar region under the care of Dr. Dewey. 2) systemic chemotherapy with carboplatin  for AUC of 5, Alimta  500 Mg/M2 and Keytruda  200 Mg IV every 3 weeks.  First dose August 29, 2021.  Status post 34  cycles.  Starting from cycle #5 the patient is on maintenance treatment with Alimta  and Keytruda  every 3 weeks.  CURRENT THERAPY: Observation.  INTERVAL HISTORY: April Walsh 58 y.o. female returns to the clinic today for follow-up visit.Discussed the use of AI scribe software for clinical note transcription with the patient, who gave verbal consent to proceed.  History of Present Illness April Walsh is a 58 year old female with lung cancer who presents for evaluation and restaging with a repeat CT scan.  She has a history of lung cancer and has been on maintenance treatment with Alimta  and Keytruda  for a total of thirty-four cycles. This treatment was discontinued, and she is currently in an observation period.  She recently visited the emergency room due to concerns about fluid gathering in her lungs, but it was related to her COPD. She experiences significant shortness of breath, stating 'I can't walk very far without giving  out a breath,' and notes difficulty walking even short distances.  She last saw her pulmonologist on August 8th, where she was prescribed medication that she feels worsened her symptoms. No fever or chills.     MEDICAL HISTORY: Past Medical History:  Diagnosis Date   Anemia    Asthma    Basal cell carcinoma    Bronchitis    Cervical cancer (HCC)    COPD (chronic obstructive pulmonary disease) (HCC)    early stages of COPD   Headache    HLD (hyperlipidemia)    Lung cancer (HCC) 08/13/2021    ALLERGIES:  is allergic to remeron  [mirtazapine ].  MEDICATIONS:  Current Outpatient Medications  Medication Sig Dispense Refill   albuterol  (ACCUNEB ) 0.63 MG/3ML nebulizer solution Take 3 mLs (0.63 mg total) by nebulization every 4 (four) hours as needed for wheezing or shortness of breath. 75 mL 11   albuterol  (VENTOLIN  HFA) 108 (90 Base) MCG/ACT inhaler Inhale 2 puffs into the lungs every 6 (six) hours as needed for wheezing or shortness of breath. 8 g 8   apixaban  (ELIQUIS ) 2.5 MG TABS tablet Take 1 tablet (2.5 mg total) by mouth 2 (two) times daily. 60 tablet 0   atorvastatin  (LIPITOR) 80 MG tablet TAKE 1 TABLET BY MOUTH EVERY DAY (Patient not taking: Reported on 12/19/2023) 30 tablet 3   azithromycin  (ZITHROMAX ) 250 MG tablet Take 1 tablet (250 mg total) by mouth daily. Take first 2 tablets together, then 1 every day until finished. 6 tablet 0   Budeson-Glycopyrrol-Formoterol  (BREZTRI  AEROSPHERE) 160-9-4.8 MCG/ACT AERO Inhale 2 puffs into the lungs in the morning and at bedtime. 10.7  each 10   Ensifentrine  (OHTUVAYRE ) 3 MG/2.5ML SUSP Inhale 2.5 mLs into the lungs 2 (two) times daily.     folic acid  (FOLVITE ) 1 MG tablet TAKE 1 TABLET BY MOUTH EVERY DAY 30 tablet 2   lidocaine -prilocaine  (EMLA ) cream APPLY TOPICALLY 1 APPLICATION AS NEEDED (Patient not taking: Reported on 12/19/2023) 30 g 2   Na Sulfate-K Sulfate-Mg Sulfate concentrate (SUPREP) 17.5-3.13-1.6 GM/177ML SOLN TAKE 1 KIT (354 MLS  TOTAL) BY MOUTH ONCE FOR 1 DOSE. (Patient not taking: Reported on 12/19/2023)     pantoprazole  (PROTONIX ) 40 MG tablet Take 1 tablet (40 mg total) by mouth 2 (two) times daily. Twice day for 6 weeks then 40 mg daily. (Patient not taking: Reported on 12/19/2023) 180 tablet 3   prochlorperazine  (COMPAZINE ) 10 MG tablet Take 1 tablet (10 mg total) by mouth every 6 (six) hours as needed. (Patient not taking: Reported on 12/19/2023) 30 tablet 2   sucralfate  (CARAFATE ) 1 g tablet Crush 1 tablet and dissolve in 10ml of warm water, mix well to create slurry and drink every 4 times daily for 4 weeks. (Patient not taking: Reported on 12/19/2023) 120 tablet 1   No current facility-administered medications for this visit.   Facility-Administered Medications Ordered in Other Visits  Medication Dose Route Frequency Provider Last Rate Last Admin   cyanocobalamin  (VITAMIN B12) 1000 MCG/ML injection            prochlorperazine  (COMPAZINE ) 10 MG tablet             SURGICAL HISTORY:  Past Surgical History:  Procedure Laterality Date   APPENDECTOMY     1996   BREAST BIOPSY Right    2010-2015   BRONCHIAL BIOPSY  08/13/2021   Procedure: BRONCHIAL BIOPSIES;  Surgeon: Shelah Lamar RAMAN, MD;  Location: [facility] END[facility];  Service: Pulmonary;;   BRONCHIAL BRUSHINGS  08/13/2021   Procedure: BRONCHIAL BRUSHINGS;  Surgeon: Shelah Lamar RAMAN, MD;  [facility];  Service: Pulmonary;;   BRONCHIAL NEEDLE ASPIRATION BIOPSY  08/13/2021   Procedure: BRONCHIAL NEEDLE ASPIRATION BIOPSIES;  Surgeon: Shelah Lamar RAMAN, MD;  Location: [facility];  Service: Pulmonary;;   CESAREAN SECTION     1984, 1987   FOOT SURGERY Right    2 pins and screw 1999   FOOT SURGERY Left 06/21/2021   screw and 2 pins   HEMOSTASIS CONTROL  08/13/2021   Procedure: HEMOSTASIS CONTROL;  Surgeon: Shelah Lamar RAMAN, MD;  Location: [facility];  Service: Pulmonary;;   IR IMAGING GUIDED PORT INSERTION  11/26/2021   IR PARACENTESIS  12/01/2023   MELANOMA EXCISION Left     basal cell melanoma 2011   PARTIAL HYSTERECTOMY     1994   TUBAL LIGATION     1987   VIDEO BRONCHOSCOPY  08/13/2021   Procedure: VIDEO BRONCHOSCOPY WITHOUT FLUORO;  Surgeon: Shelah Lamar RAMAN, MD;  Location: [facility] ENDOSCOPY;  Service: Pulmonary;;   VIDEO BRONCHOSCOPY WITH ENDOBRONCHIAL ULTRASOUND N/A 08/13/2021   Procedure: VIDEO BRONCHOSCOPY WITH ENDOBRONCHIAL ULTRASOUND;  Surgeon: Shelah Lamar RAMAN, MD;  Location: [facility];  Service: Pulmonary;  Laterality: N/A;    REVIEW OF SYSTEMS:  Constitutional: positive for fatigue Eyes: negative Ears, nose, mouth, throat, and face: negative Respiratory: positive for dyspnea on exertion Cardiovascular: negative Gastrointestinal: negative Genitourinary:negative Integument/breast: negative Hematologic/lymphatic: negative Musculoskeletal:negative Neurological: negative Behavioral/Psych: negative Endocrine: negative Allergic/Immunologic: negative   PHYSICAL EXAMINATION: General appearance: alert, cooperative, fatigued, and no distress Head: Normocephalic, without obvious abnormality, atraumatic Neck: no adenopathy, no JVD, supple, symmetrical, trachea midline, and  thyroid  not enlarged, symmetric, no tenderness/mass/nodules Lymph nodes: Cervical, supraclavicular, and axillary nodes normal. Resp: clear to auscultation bilaterally Back: symmetric, no curvature. ROM normal. No CVA tenderness. Cardio: regular rate and rhythm, S1, S2 normal, no murmur, click, rub or gallop GI: soft, non-tender; bowel sounds normal; no masses,  no organomegaly Extremities: extremities normal, atraumatic, no cyanosis or edema Neurologic: Alert and oriented X 3, normal strength and tone. Normal symmetric reflexes. Normal coordination and gait  ECOG PERFORMANCE STATUS: 1 - Symptomatic but completely ambulatory  Blood pressure 126/78, pulse (!) 102, temperature (!) 96.8 F (36 C), resp. rate 17, height 5' (1.524 m), weight 102 lb 14.4 oz (46.7 kg), SpO2  95%.  LABORATORY DATA: Lab Results  Component Value Date   WBC 9.1 02/06/2024   HGB 11.8 (L) 02/06/2024   HCT 36.2 02/06/2024   MCV 91.6 02/06/2024   PLT 271 02/06/2024      Chemistry      Component Value Date/Time   NA 133 (L) 02/06/2024 0935   K 3.9 02/06/2024 0935   CL 102 02/06/2024 0935   CO2 25 02/06/2024 0935   BUN 13 02/06/2024 0935   CREATININE 0.80 02/06/2024 0935      Component Value Date/Time   CALCIUM  8.9 02/06/2024 0935   ALKPHOS 112 02/06/2024 0935   AST 14 (L) 02/06/2024 0935   ALT 7 02/06/2024 0935   BILITOT 0.3 02/06/2024 0935       RADIOGRAPHIC STUDIES: CT CHEST ABDOMEN PELVIS W CONTRAST Result Date: 02/06/2024 CLINICAL DATA:  History of stage IV lung cancer, follow-up. * Tracking Code: BO * EXAM: CT CHEST, ABDOMEN, AND PELVIS WITH CONTRAST TECHNIQUE: Multidetector CT imaging of the chest, abdomen and pelvis was performed following the standard protocol during bolus administration of intravenous contrast. RADIATION DOSE REDUCTION: This exam was performed according to the departmental dose-optimization program which includes automated exposure control, adjustment of the mA and/or kV according to patient size and/or use of iterative reconstruction technique. CONTRAST:  OMNIPAQUE  IOHEXOL  300 MG/ML  SOLN COMPARISON:  Multiple priors including CT November 06, 2023 FINDINGS: CT CHEST FINDINGS Cardiovascular: Accessed right chest Port-A-Cath with tip near the superior cavoatrial junction. Aortic atherosclerosis. Normal size heart. No significant pericardial effusion/thickening. Mediastinum/Nodes: No suspicious thyroid  nodule. Similar diffuse esophageal wall thickening. Similar right hilar thickening with a prominent right hilar lymph node measuring 7 mm on image 21/2, unchanged. Lungs/Pleura: Similar right perihilar soft tissue thickening. Again seen is masslike consolidation architectural distortion and bronchiectasis/bronchiolectasis in the right perihilar lung,  previously indexed nodular component along long the superior aspect of this measures 2.4 x 1.8 cm on image 50/7 previously 2.6 x 1.7 cm when remeasured for consistency. New nodular focus along the posterosuperior aspect of the masslike consolidation measures 14 mm on image 42/7. Similar reticulations with interstitial thickening, volume loss and traction bronchiectasis in the anterior left upper lobe. Progressive now bilobar nodular interstitial thickening with interposed ground-glass in the bilateral lower lobes. Stable moderate right and small left pleural effusions with associated relaxation atelectasis. Musculoskeletal: No aggressive lytic or blastic lesion of bone. CT ABDOMEN PELVIS FINDINGS Hepatobiliary: No suspicious hepatic lesion. Gallbladder is unremarkable. No biliary ductal dilation. Pancreas: No pancreatic ductal dilation or evidence of acute inflammation. Spleen: No splenomegaly. Adrenals/Urinary Tract: No suspicious adrenal nodule/mass. No hydronephrosis. Left lower pole renal cyst. Kidneys demonstrate symmetric enhancement. Urinary bladder is unremarkable for degree of distension. Stomach/Bowel: Wall thickening versus underdistention of the gastric antrum. No pathologic dilation of small or large bowel. Vascular/Lymphatic:  Aortic atherosclerosis. Normal caliber abdominal aorta. Smooth IVC contours. No pathologically enlarged abdominal or pelvic lymph nodes. Reproductive: No acute or suspicious finding. Other: Decreased now small volume of pelvic free fluid. Decreased diffuse subcutaneous edema. Musculoskeletal: No aggressive lytic or blastic lesion of bone. IMPRESSION: 1. Similar right perihilar soft tissue thickening with a new nodular focus along the posterosuperior aspect of the masslike consolidation measuring 14 mm, nonspecific consider attention on short-term interval follow-up CT versus nuclear medicine PET-CT. 2. Progressive now bilobar nodular interstitial thickening with interposed  ground-glass in the bilateral lower lobes, nonspecific and possibly reflecting an infectious or inflammatory process including drug reaction. 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 Attention on follow-up imaging suggested. 3. Stable moderate right and small left pleural effusions with associated relaxation atelectasis. 4. No evidence of metastatic disease in the abdomen or pelvis. 5. Wall thickening versus underdistention of the gastric antrum, correlate for gastritis. 6. Similar esophageal wall thickening likely reflecting postradiation sequela. 7. Decreased now small volume of pelvic free fluid. 8.  Aortic Atherosclerosis (ICD10-I70.0). Electronically Signed   By: Reyes Holder M.D.   On: 02/06/2024 15:21     ASSESSMENT AND PLAN: This is a very pleasant 58 years old white female with Stage IV (T2b, N2, M1 C) non-small cell lung cancer, adenocarcinoma presented with right hilar mass in addition to mediastinal and upper abdominal lymphadenopathy in addition to bilateral adrenal metastases and metastatic disease in the musculature of the lower back.  This was diagnosed in April 2023. The molecular studies by [employer] showed no actionable mutations and the patient has negative PD-L1 expression. She is currently undergoing palliative radiotherapy to the right hilar region under the care of Dr. Dewey. The patient completed systemic chemotherapy started with carboplatin  for AUC of 5, Alimta  500 Mg/M2 and Keytruda  200 Mg IV every 3 weeks and starting from cycle #5 she is on maintenance treatment with Alimta  and Keytruda  every 3 weeks.  Status post a total of 35 cycles.  Last dose was given in April 2025.  She is currently on observation She had repeat CT scan of the chest, abdomen and pelvis performed recently.  I personally independently reviewed the scan and discussed the result with the patient today.  Her scan showed no concerning findings for disease progression  but she continues to have evidence for inflammatory process in her lung. Assessment and Plan Assessment & Plan Lung cancer, currently under observation Lung cancer is under observation following completion of 34 cycles of maintenance treatment with Alimta  and Keytruda . Recent CT scan of the chest, abdomen, and pelvis shows no evidence of cancer growth. Continued observation is warranted due to potential for changes in disease status. - Continue observation with repeat CT scan every three months.  Chronic obstructive pulmonary disease (COPD) COPD with recent emergency room visit. Current symptoms include dyspnea on exertion. Recent treatment with steroids was ineffective. Pulmonologist follow-up is scheduled for further management. - Follow up with pulmonologist Dr. Shelah for further management of COPD.  Pulmonary fibrosis Significant scarring and inflammation in the lungs. No acute changes noted on recent imaging.  Dyspnea on exertion Likely related to COPD and pulmonary fibrosis. No acute exacerbation noted. The patient was advised to call immediately if she has any other concerning symptoms in the interval.  The patient voices understanding of current disease status and treatment options and is in agreement with the current care plan. All questions were answered. The patient knows to call the clinic with any problems,  questions or concerns. We can certainly see the patient much sooner if necessary.  The total time spent in the appointment was 30 minutes.  Disclaimer: This note was dictated with voice recognition software. Similar sounding words can inadvertently be transcribed and may not be corrected upon review.

## 2024-02-13 ENCOUNTER — Ambulatory Visit: Admitting: Nurse Practitioner

## 2024-02-13 ENCOUNTER — Ambulatory Visit (INDEPENDENT_AMBULATORY_CARE_PROVIDER_SITE_OTHER)

## 2024-02-13 ENCOUNTER — Encounter: Payer: Self-pay | Admitting: Nurse Practitioner

## 2024-02-13 VITALS — BP 118/79 | HR 102 | Temp 98.1°F | Ht 60.0 in | Wt 102.0 lb

## 2024-02-13 DIAGNOSIS — J441 Chronic obstructive pulmonary disease with (acute) exacerbation: Secondary | ICD-10-CM | POA: Diagnosis not present

## 2024-02-13 DIAGNOSIS — R918 Other nonspecific abnormal finding of lung field: Secondary | ICD-10-CM | POA: Diagnosis not present

## 2024-02-13 DIAGNOSIS — C3491 Malignant neoplasm of unspecified part of right bronchus or lung: Secondary | ICD-10-CM

## 2024-02-13 DIAGNOSIS — J8410 Pulmonary fibrosis, unspecified: Secondary | ICD-10-CM | POA: Diagnosis not present

## 2024-02-13 DIAGNOSIS — J90 Pleural effusion, not elsewhere classified: Secondary | ICD-10-CM

## 2024-02-13 DIAGNOSIS — J449 Chronic obstructive pulmonary disease, unspecified: Secondary | ICD-10-CM

## 2024-02-13 DIAGNOSIS — R0602 Shortness of breath: Secondary | ICD-10-CM | POA: Diagnosis not present

## 2024-02-13 MED ORDER — METHYLPREDNISOLONE ACETATE 80 MG/ML IJ SUSP
80.0000 mg | Freq: Once | INTRAMUSCULAR | Status: AC
  Administered 2024-02-13: 80 mg via INTRAMUSCULAR

## 2024-02-13 MED ORDER — PREDNISONE 10 MG PO TABS
ORAL_TABLET | ORAL | 0 refills | Status: AC
Start: 2024-02-13 — End: ?

## 2024-02-13 MED ORDER — AZITHROMYCIN 250 MG PO TABS: ORAL_TABLET | ORAL | 0 refills | Status: AC

## 2024-02-13 NOTE — Assessment & Plan Note (Signed)
 She has bilateral effusions. Appear stable on exam today. Will check CXR. If she has recurrence, may need repeat thora or consider PleurX.

## 2024-02-13 NOTE — Patient Instructions (Addendum)
 Continue Breztri  2 puffs Twice daily. Brush tongue and rinse mouth afterwards Continue Albuterol  inhaler 2 puffs or 3 mL neb every 6 hours as needed for shortness of breath or wheezing. Notify if symptoms persist despite rescue inhaler/neb use.  Continue Ohtuvayre  2.5 mL neb Twice daily. Monitor for any mood changes and notify if these occur. This is an additional maintenance medication that you will use twice daily, regardless of how you feel. Not meant for emergency use  -Prednisone  taper. 4 tabs for 3 days, then 3 tabs for 3 days, 2 tabs for 3 days, then 1 tab for 3 days, then stop. Take in AM with food. Start tomorrow -Azithromycin  - take 2 tabs on day one then 1 tab daily for four additional days. Take with food  -Guaifenesin [phone] mg Twice daily as needed for cough/congestion    Chest x ray today Steroid shot today   Follow up in 3-4 weeks with Dr. Shelah or Izetta Malachy PIETY. If symptoms do not improve or worsen, please contact office for sooner follow up or seek emergency care.

## 2024-02-13 NOTE — Assessment & Plan Note (Signed)
 Very severe COPD with high symptom burden that is multifactorial. Appears to be having a mild exacerbation with associated bronchospasm and increased dyspnea. No increased sputum production or purulent sputum. On exam, effusions seem stable. Will recheck CXR today to ensure there has not been an increase in accumulation. Will treat her with prednisone  taper and empiric azithromycin . Continue aggressive maintenance regimen. Action plan in place.   Patient Instructions  Continue Breztri  2 puffs Twice daily. Brush tongue and rinse mouth afterwards Continue Albuterol  inhaler 2 puffs or 3 mL neb every 6 hours as needed for shortness of breath or wheezing. Notify if symptoms persist despite rescue inhaler/neb use.  Continue Ohtuvayre  2.5 mL neb Twice daily. Monitor for any mood changes and notify if these occur. This is an additional maintenance medication that you will use twice daily, regardless of how you feel. Not meant for emergency use  -Prednisone  taper. 4 tabs for 3 days, then 3 tabs for 3 days, 2 tabs for 3 days, then 1 tab for 3 days, then stop. Take in AM with food. Start tomorrow -Azithromycin  - take 2 tabs on day one then 1 tab daily for four additional days. Take with food  -Guaifenesin [phone] mg Twice daily as needed for cough/congestion    Chest x ray today Steroid shot today   Follow up in 3-4 weeks with Dr. Shelah or Izetta Malachy PIETY. If symptoms do not improve or worsen, please contact office for sooner follow up or seek emergency care.

## 2024-02-13 NOTE — Progress Notes (Signed)
 @Patient  ID: Grayce Ebbing, female    DOB: August 21, 1965, 58 y.o.   MRN: [id number]  Chief Complaint  Patient presents with   COPD    COPD.  More sob than what is normal for her.  Wheezing.    Referring provider: [name], *  HPI: 58 year old female, former smoker followed for COPD and adenocarcinoma. She is a patient of Dr. Lanny and last seen in office 12/19/2023 by [facility] NP. Past medical history significant for cervical cancer, migraines, depression, hx of CVA, anxiety, depression.   TEST/EVENTS:  09/17/2023 CT chest: Port-A-Cath. Distal esophageal wall thickening. Similar soft tissue thickening about the right hilum, middle mediastinum. Right perihilar problem loss with architectural distortion, interstitial thickening and airway thickening similar to prior examination including a nodular component, 2.2x1.4 cm and stable. New 7 mm nodule RLL. Multifocal interstitial/interlobular septal thickening with patchy ground glass, stable. Increased size moderate left effusion.  10/14/2023 PFT: FVC 44, FEV1 33, ratio 56, TLC 77, DLCOcor 40 02/06/2024 CT chest: similar diffuse esophageal thickening. Atherosclerosis. Similar right perihilar soft tissue thickening with a new nodular focus along the posterosuperior aspect of the masslike consolidation, measuring 14 mm. Progressive nodular interstitial thickening with interposed ground-glass in the b/l lower lobes, nonspecific and possibly reflecting an infectious or inflammatory process. Stable pleural effusions.    06/25/2023: OV with Dr. Shelah. Stage IV adenocarcinoma of the right lung diagnosed in 08/2021 by bronchoscopy. Treated with concurrent chemoradiation and immune therapy with Keytruda . Course complicated by pleural effusions requiring thoracenteses, last 02/2023. Intermittent wheezing. Intermittent SOB with exertion. Associated dizziness and weakness in setting of anemia end of 2024, treated with PRBC. Improved but still with exertional SOB  with chores, notable decrease in functional capacity. Remains on Breztri . Got albuterol  HFA from oncology; feels this helps. Repeat CT scheduled. Question if she is reaccumulating pleural fluid, may benefit from repeat thora. Will await CT imaging. Note that she also has increased risk for pneumonitis with Keytruda . If progressive groundglass infiltrates, will need to discuss with Dr. Sherrod. Rx for albuterol  nebs.   09/30/2023: SHERLEAN with Chimene Salo NP Discussed the use of AI scribe software for clinical note transcription with the patient, who gave verbal consent to proceed. Keyairra Kolinski is a 58 year old female with lung cancer who presents with worsening breathing difficulties. She has been experiencing worsening breathing difficulties over the last couple of months. CT chest showed increasing left pleural effusion. She is scheduled for a thoracentesis in two days, with her last procedure having been in October 2024. She experienced relief until late December 2024, when symptoms began to worsen again.  Since January 2025, she has experienced significant fatigue, stating that simple activities like getting out of a recliner leave her 'wore out.'  She is planned to take a break from cancer treatment as aside from the pleural effusion, her scans are stable.  Her hemoglobin levels are still low but overall improved, currently at 10 g/dL. She uses Breztri  and a rescue inhaler, and she uses a nebulizer approximately every three to four days. She experiences wheezing and a cough producing whitish phlegm. No fever or hemoptysis. Her appetite is reduced, but she describes her eating as normal for her since she was diagnosed with cancer and on treatment.  No leg swelling, orthopnea, PND.     12/19/2023: Ov with Amarise Lillo NP Kiesha Ensey is a 58 year old female with severe COPD and recurrent pleural effusions who presents for follow up. She feels better since her  last visit, and after her thoracentesis that she had end of  May 2025. She then had a paracentesis May and July, last 7/21. Both of these have helped with her breathing. She continues to experience wheezing and occasional coughing with minimal sputum production. No hemoptysis is present. Still has some chest tightness. No fevers, chills, leg swelling.  Her history of severe COPD and recurrent pleural effusions has necessitated previous thoracentesis and paracentesis procedures. A CT scan is scheduled for September. She can sense when a thoracentesis might be needed again. Feels stable right now. She is currently on a break from chemo treatment but has previously found prednisone  helpful in managing her symptoms. Her current medications include Breztri  and albuterol  for breathing difficulties.   02/13/2024: Today - follow up History of Present Illness  Cally Nygard is a 58 year old female who presents for follow up.   She has experienced increased difficulty breathing over the past few days, particularly when walking short distances, such as from the front door of the cancer center to the waiting room. This occurs every few days and is accompanied by a 'wheezy' sensation. No fever or hemoptysis is noted, and she reports a minimal cough with white or clear sputum. No chest congestion. Eating and drinking well.   She has been using Ohtuvayre  for about two weeks, twice daily, and continues to use Breztri . Last night, she used a breathing treatment followed by Ohtuvayre  and albuterol . Her husband notes that she experiences similar symptoms around this time every year, possibly due to seasonal change.     Allergies  Allergen Reactions   Remeron  [Mirtazapine ] Other (See Comments)    States she slept for 5 days    Immunization History  Administered Date(s) Administered   Influenza Split 05/09/2015   PNEUMOCOCCAL CONJUGATE-20 08/08/2021    Past Medical History:  Diagnosis Date   Anemia    Asthma    Basal cell carcinoma    Bronchitis    Cervical cancer  (HCC)    COPD (chronic obstructive pulmonary disease) (HCC)    early stages of COPD   Headache    HLD (hyperlipidemia)    Lung cancer (HCC) 08/13/2021    Tobacco History: Social History   Tobacco Use  Smoking Status Former   Current packs/day: 0.00   Average packs/day: 0.5 packs/day for 32.0 years (16.0 ttl pk-yrs)   Types: Cigarettes   Quit date: 08/11/2021   Years since quitting: 2.5   Passive exposure: Past  Smokeless Tobacco Never  Tobacco Comments   1/2 pack smoked daily ARJ, RN 08/02/21   Counseling given: Not Answered Tobacco comments: 1/2 pack smoked daily ARJ, RN 08/02/21   Outpatient Medications Prior to Visit  Medication Sig Dispense Refill   albuterol  (ACCUNEB ) 0.63 MG/3ML nebulizer solution Take 3 mLs (0.63 mg total) by nebulization every 4 (four) hours as needed for wheezing or shortness of breath. 75 mL 11   albuterol  (VENTOLIN  HFA) 108 (90 Base) MCG/ACT inhaler Inhale 2 puffs into the lungs every 6 (six) hours as needed for wheezing or shortness of breath. 8 g 8   apixaban  (ELIQUIS ) 2.5 MG TABS tablet Take 1 tablet (2.5 mg total) by mouth 2 (two) times daily. 60 tablet 0   Budeson-Glycopyrrol-Formoterol  (BREZTRI  AEROSPHERE) 160-9-4.8 MCG/ACT AERO Inhale 2 puffs into the lungs in the morning and at bedtime. 10.7 each 10   Ensifentrine  (OHTUVAYRE ) 3 MG/2.5ML SUSP Inhale 2.5 mLs into the lungs 2 (two) times daily.     folic acid  (FOLVITE )  1 MG tablet TAKE 1 TABLET BY MOUTH EVERY DAY 30 tablet 2   lidocaine -prilocaine  (EMLA ) cream APPLY TOPICALLY 1 APPLICATION AS NEEDED 30 g 2   prochlorperazine  (COMPAZINE ) 10 MG tablet Take 1 tablet (10 mg total) by mouth every 6 (six) hours as needed. 30 tablet 2   pantoprazole  (PROTONIX ) 40 MG tablet Take 1 tablet (40 mg total) by mouth 2 (two) times daily. Twice day for 6 weeks then 40 mg daily. (Patient not taking: Reported on 02/13/2024) 180 tablet 3   atorvastatin  (LIPITOR) 80 MG tablet TAKE 1 TABLET BY MOUTH EVERY DAY (Patient  not taking: Reported on 02/13/2024) 30 tablet 3   azithromycin  (ZITHROMAX ) 250 MG tablet Take 1 tablet (250 mg total) by mouth daily. Take first 2 tablets together, then 1 every day until finished. (Patient not taking: Reported on 02/13/2024) 6 tablet 0   Na Sulfate-K Sulfate-Mg Sulfate concentrate (SUPREP) 17.5-3.13-1.6 GM/177ML SOLN TAKE 1 KIT (354 MLS TOTAL) BY MOUTH ONCE FOR 1 DOSE. (Patient not taking: Reported on 02/13/2024)     sucralfate  (CARAFATE ) 1 g tablet Crush 1 tablet and dissolve in 10ml of warm water, mix well to create slurry and drink every 4 times daily for 4 weeks. (Patient not taking: Reported on 02/13/2024) 120 tablet 1   Facility-Administered Medications Prior to Visit  Medication Dose Route Frequency Provider Last Rate Last Admin   cyanocobalamin  (VITAMIN B12) 1000 MCG/ML injection            prochlorperazine  (COMPAZINE ) 10 MG tablet              Review of Systems: As above    Physical Exam:  BP 118/79 (BP Location: Right Arm, Patient Position: Sitting)   Pulse (!) 102   Temp 98.1 F (36.7 C) (Oral)   Ht 5' (1.524 m)   Wt 102 lb (46.3 kg)   SpO2 94% Comment: RA  BMI 19.92 kg/m   GEN: Pleasant, interactive, well-kempt; in no acute distress HEENT:  Normocephalic and atraumatic. PERRLA. Sclera white. Nasal turbinates pink, moist and patent bilaterally. No rhinorrhea present. Oropharynx pink and moist, without exudate or edema. No lesions, ulcerations, or postnasal drip.  NECK:  Supple w/ fair ROM. No lymphadenopathy.   CV: RRR, no m/r/g, no peripheral edema. Pulses intact, +2 bilaterally. No cyanosis, pallor or clubbing. PULMONARY:  Unlabored, regular breathing. Scattered wheezes bilaterally A&P. No accessory muscle use.  GI: BS present and normoactive. Soft, non-tender to palpation. MSK: No erythema, warmth or tenderness. Cap refil <2 sec all extrem. Neuro: A/Ox3. No focal deficits noted.   Skin: Warm, no lesions or rashe Psych: Normal affect and behavior.  Judgement and thought content appropriate.     Lab Results:  CBC    Component Value Date/Time   WBC 9.1 02/06/2024 0935   WBC 9.9 12/26/2023 1535   RBC 3.95 02/06/2024 0935   HGB 11.8 (L) 02/06/2024 0935   HCT 36.2 02/06/2024 0935   PLT 271 02/06/2024 0935   MCV 91.6 02/06/2024 0935   MCH 29.9 02/06/2024 0935   MCHC 32.6 02/06/2024 0935   RDW 17.3 (H) 02/06/2024 0935   LYMPHSABS 1.1 02/06/2024 0935   MONOABS 1.3 (H) 02/06/2024 0935   EOSABS 0.2 02/06/2024 0935   BASOSABS 0.1 02/06/2024 0935    BMET    Component Value Date/Time   NA 133 (L) 02/06/2024 0935   K 3.9 02/06/2024 0935   CL 102 02/06/2024 0935   CO2 25 02/06/2024 0935   GLUCOSE 82 02/06/2024 0935  BUN 13 02/06/2024 0935   CREATININE 0.80 02/06/2024 0935   CALCIUM  8.9 02/06/2024 0935   GFRNONAA >60 02/06/2024 0935    BNP No results found for: BNP   Imaging:  CT CHEST ABDOMEN PELVIS W CONTRAST Result Date: 02/06/2024 CLINICAL DATA:  History of stage IV lung cancer, follow-up. * Tracking Code: BO * EXAM: CT CHEST, ABDOMEN, AND PELVIS WITH CONTRAST TECHNIQUE: Multidetector CT imaging of the chest, abdomen and pelvis was performed following the standard protocol during bolus administration of intravenous contrast. RADIATION DOSE REDUCTION: This exam was performed according to the departmental dose-optimization program which includes automated exposure control, adjustment of the mA and/or kV according to patient size and/or use of iterative reconstruction technique. CONTRAST:  OMNIPAQUE  IOHEXOL  300 MG/ML  SOLN COMPARISON:  Multiple priors including CT November 06, 2023 FINDINGS: CT CHEST FINDINGS Cardiovascular: Accessed right chest Port-A-Cath with tip near the superior cavoatrial junction. Aortic atherosclerosis. Normal size heart. No significant pericardial effusion/thickening. Mediastinum/Nodes: No suspicious thyroid  nodule. Similar diffuse esophageal wall thickening. Similar right hilar thickening with a  prominent right hilar lymph node measuring 7 mm on image 21/2, unchanged. Lungs/Pleura: Similar right perihilar soft tissue thickening. Again seen is masslike consolidation architectural distortion and bronchiectasis/bronchiolectasis in the right perihilar lung, previously indexed nodular component along long the superior aspect of this measures 2.4 x 1.8 cm on image 50/7 previously 2.6 x 1.7 cm when remeasured for consistency. New nodular focus along the posterosuperior aspect of the masslike consolidation measures 14 mm on image 42/7. Similar reticulations with interstitial thickening, volume loss and traction bronchiectasis in the anterior left upper lobe. Progressive now bilobar nodular interstitial thickening with interposed ground-glass in the bilateral lower lobes. Stable moderate right and small left pleural effusions with associated relaxation atelectasis. Musculoskeletal: No aggressive lytic or blastic lesion of bone. CT ABDOMEN PELVIS FINDINGS Hepatobiliary: No suspicious hepatic lesion. Gallbladder is unremarkable. No biliary ductal dilation. Pancreas: No pancreatic ductal dilation or evidence of acute inflammation. Spleen: No splenomegaly. Adrenals/Urinary Tract: No suspicious adrenal nodule/mass. No hydronephrosis. Left lower pole renal cyst. Kidneys demonstrate symmetric enhancement. Urinary bladder is unremarkable for degree of distension. Stomach/Bowel: Wall thickening versus underdistention of the gastric antrum. No pathologic dilation of small or large bowel. Vascular/Lymphatic: Aortic atherosclerosis. Normal caliber abdominal aorta. Smooth IVC contours. No pathologically enlarged abdominal or pelvic lymph nodes. Reproductive: No acute or suspicious finding. Other: Decreased now small volume of pelvic free fluid. Decreased diffuse subcutaneous edema. Musculoskeletal: No aggressive lytic or blastic lesion of bone. IMPRESSION: 1. Similar right perihilar soft tissue thickening with a new nodular  focus along the posterosuperior aspect of the masslike consolidation measuring 14 mm, nonspecific consider attention on short-term interval follow-up CT versus nuclear medicine PET-CT. 2. Progressive now bilobar nodular interstitial thickening with interposed ground-glass in the bilateral lower lobes, nonspecific and possibly reflecting an infectious or inflammatory process including drug reaction. 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 Attention on follow-up imaging suggested. 3. Stable moderate right and small left pleural effusions with associated relaxation atelectasis. 4. No evidence of metastatic disease in the abdomen or pelvis. 5. Wall thickening versus underdistention of the gastric antrum, correlate for gastritis. 6. Similar esophageal wall thickening likely reflecting postradiation sequela. 7. Decreased now small volume of pelvic free fluid. 8.  Aortic Atherosclerosis (ICD10-I70.0). Electronically Signed   By: Reyes Holder M.D.   On: 02/06/2024 15:21    methylPREDNISolone  acetate (DEPO-MEDROL ) injection 80 mg     Date Action Dose Route User  02/13/2024 1023 Given 80 mg Intramuscular (Left Ventrogluteal) Dave Sans, Powell SAILOR, RN          Latest Ref Rng & Units 10/14/2023    8:29 AM  PFT Results  FVC-Pre L 1.28   FVC-Predicted Pre % 44   FVC-Post L 1.29   FVC-Predicted Post % 44   Pre FEV1/FVC % % 58   Post FEV1/FCV % % 56   FEV1-Pre L 0.74   FEV1-Predicted Pre % 33   FEV1-Post L 0.73   DLCO uncorrected ml/min/mmHg 6.31   DLCO UNC% % 35   DLCO corrected ml/min/mmHg 7.13   DLCO COR %Predicted % 40   DLVA Predicted % 83   TLC L 3.43   TLC % Predicted % 77   RV % Predicted % 118     No results found for: NITRICOXIDE      Assessment & Plan:   COPD, very severe (HCC) Very severe COPD with high symptom burden that is multifactorial. Appears to be having a mild exacerbation with associated bronchospasm and increased dyspnea.  No increased sputum production or purulent sputum. On exam, effusions seem stable. Will recheck CXR today to ensure there has not been an increase in accumulation. Will treat her with prednisone  taper and empiric azithromycin . Continue aggressive maintenance regimen. Action plan in place.   Patient Instructions  Continue Breztri  2 puffs Twice daily. Brush tongue and rinse mouth afterwards Continue Albuterol  inhaler 2 puffs or 3 mL neb every 6 hours as needed for shortness of breath or wheezing. Notify if symptoms persist despite rescue inhaler/neb use.  Continue Ohtuvayre  2.5 mL neb Twice daily. Monitor for any mood changes and notify if these occur. This is an additional maintenance medication that you will use twice daily, regardless of how you feel. Not meant for emergency use  -Prednisone  taper. 4 tabs for 3 days, then 3 tabs for 3 days, 2 tabs for 3 days, then 1 tab for 3 days, then stop. Take in AM with food. Start tomorrow -Azithromycin  - take 2 tabs on day one then 1 tab daily for four additional days. Take with food  -Guaifenesin [phone] mg Twice daily as needed for cough/congestion    Chest x ray today Steroid shot today   Follow up in 3-4 weeks with Dr. Shelah or Izetta Malachy PIETY. If symptoms do not improve or worsen, please contact office for sooner follow up or seek emergency care.    Bilateral pleural effusion She has bilateral effusions. Appear stable on exam today. Will check CXR. If she has recurrence, may need repeat thora or consider PleurX.   Adenocarcinoma of right lung, stage 4 (HCC) Prior concurrent chemoradiation and Keytruda . Under active surveillance. Last scan was stable aside from increased effusion. Did have some increased ground-glass in b/l lower lobes. Possibly infectious/inflammatory given acute symptoms. See above. Will need to closely monitor off therapy. Follow up with oncology as scheduled.      Advised if symptoms do not improve or worsen, to please  contact office for sooner follow up or seek emergency care.   I spent 35 minutes of dedicated to the care of this patient on the date of this encounter to include pre-visit review of records, face-to-face time with the patient discussing conditions above, post visit ordering of testing, clinical documentation with the electronic health record, making appropriate referrals as documented, and communicating necessary findings to members of the patients care team.  Comer LULLA Malachy, NP 02/13/2024  Pt aware and understands NP's role.

## 2024-02-13 NOTE — Assessment & Plan Note (Signed)
 Prior concurrent chemoradiation and Keytruda . Under active surveillance. Last scan was stable aside from increased effusion. Did have some increased ground-glass in b/l lower lobes. Possibly infectious/inflammatory given acute symptoms. See above. Will need to closely monitor off therapy. Follow up with oncology as scheduled.

## 2024-02-19 ENCOUNTER — Ambulatory Visit: Payer: Self-pay | Admitting: Nurse Practitioner

## 2024-02-19 NOTE — Progress Notes (Signed)
 ATCx1 LVMTCB, [employer]

## 2024-02-19 NOTE — Progress Notes (Signed)
 Stable CXR without increased effusion. No changes. Thanks.

## 2024-02-20 NOTE — Telephone Encounter (Signed)
 Copied from CRM [id number]. Topic: Clinical - Lab/Test Results >> Feb 19, 2024  2:26 PM April Walsh wrote: Reason for CRM: Patient calling for chest xray results - relayed message:   Stable CXR without increased effusion. No changes. Thanks.   Patient had not further questions.

## 2024-02-23 ENCOUNTER — Other Ambulatory Visit: Payer: Self-pay | Admitting: Physician Assistant

## 2024-02-23 DIAGNOSIS — C3401 Malignant neoplasm of right main bronchus: Secondary | ICD-10-CM

## 2024-02-23 NOTE — Telephone Encounter (Signed)
 Patient does not want medication refilled

## 2024-03-18 ENCOUNTER — Encounter: Payer: Self-pay | Admitting: Nurse Practitioner

## 2024-03-18 ENCOUNTER — Ambulatory Visit: Admitting: Nurse Practitioner

## 2024-03-18 VITALS — BP 122/82 | HR 108 | Ht 60.0 in | Wt 98.4 lb

## 2024-03-18 DIAGNOSIS — J90 Pleural effusion, not elsewhere classified: Secondary | ICD-10-CM | POA: Diagnosis not present

## 2024-03-18 DIAGNOSIS — C3491 Malignant neoplasm of unspecified part of right bronchus or lung: Secondary | ICD-10-CM

## 2024-03-18 DIAGNOSIS — J449 Chronic obstructive pulmonary disease, unspecified: Secondary | ICD-10-CM | POA: Diagnosis not present

## 2024-03-18 NOTE — Assessment & Plan Note (Signed)
 Very severe COPD with high symptom burden that is multifactorial. Resolved exacerbation following prednisone  and empiric azithromycin . Now at baseline. Recent CXR with stable, chronic changes. Improved baseline DOE with use of Ohtuvayre . Continue aggressive maintenance regimen. Action plan in place.   Patient Instructions  Continue Breztri  2 puffs Twice daily. Brush tongue and rinse mouth afterwards Continue Albuterol  inhaler 2 puffs or 3 mL neb every 6 hours as needed for shortness of breath or wheezing. Notify if symptoms persist despite rescue inhaler/neb use.  Continue Ohtuvayre  2.5 mL neb Twice daily. Monitor for any mood changes and notify if these occur. This is an additional maintenance medication that you will use twice daily, regardless of how you feel. Not meant for emergency use   Follow up in 3-4 months with Dr. Shelah or Izetta Malachy PIETY. If symptoms do not improve or worsen, please contact office for sooner follow up or seek emergency care.

## 2024-03-18 NOTE — Patient Instructions (Signed)
 Continue Breztri  2 puffs Twice daily. Brush tongue and rinse mouth afterwards Continue Albuterol  inhaler 2 puffs or 3 mL neb every 6 hours as needed for shortness of breath or wheezing. Notify if symptoms persist despite rescue inhaler/neb use.  Continue Ohtuvayre  2.5 mL neb Twice daily. Monitor for any mood changes and notify if these occur. This is an additional maintenance medication that you will use twice daily, regardless of how you feel. Not meant for emergency use   Follow up in 3-4 months with Dr. Shelah or Izetta Malachy PIETY. If symptoms do not improve or worsen, please contact office for sooner follow up or seek emergency care.

## 2024-03-18 NOTE — Progress Notes (Signed)
 @Patient  ID: April Walsh, female    DOB: Nov 29, 1965, 58 y.o.   MRN: [id number]  Chief Complaint  Patient presents with   Medical Management of Chronic Issues    Referring provider: [name], *  HPI: 58 year old female, former smoker followed for COPD and adenocarcinoma. She is a patient of Dr. Lanny and last seen in office 02/13/2024 by [facility] NP. Past medical history significant for cervical cancer, migraines, depression, hx of CVA, anxiety, depression.   TEST/EVENTS:  09/17/2023 CT chest: Port-A-Cath. Distal esophageal wall thickening. Similar soft tissue thickening about the right hilum, middle mediastinum. Right perihilar problem loss with architectural distortion, interstitial thickening and airway thickening similar to prior examination including a nodular component, 2.2x1.4 cm and stable. New 7 mm nodule RLL. Multifocal interstitial/interlobular septal thickening with patchy ground glass, stable. Increased size moderate left effusion.  10/14/2023 PFT: FVC 44, FEV1 33, ratio 56, TLC 77, DLCOcor 40 02/06/2024 CT chest: similar diffuse esophageal thickening. Atherosclerosis. Similar right perihilar soft tissue thickening with a new nodular focus along the posterosuperior aspect of the masslike consolidation, measuring 14 mm. Progressive nodular interstitial thickening with interposed ground-glass in the b/l lower lobes, nonspecific and possibly reflecting an infectious or inflammatory process. Stable pleural effusions.  02/13/2024 CXR: mild cardiomegaly. Unchanged appearance of chest with perihilar consolidation and fibrosis of right lung. Superimposed diffuse opacity and small R>L effusions.    06/25/2023: OV with Dr. Shelah. Stage IV adenocarcinoma of the right lung diagnosed in 08/2021 by bronchoscopy. Treated with concurrent chemoradiation and immune therapy with Keytruda . Course complicated by pleural effusions requiring thoracenteses, last 02/2023. Intermittent wheezing.  Intermittent SOB with exertion. Associated dizziness and weakness in setting of anemia end of 2024, treated with PRBC. Improved but still with exertional SOB with chores, notable decrease in functional capacity. Remains on Breztri . Got albuterol  HFA from oncology; feels this helps. Repeat CT scheduled. Question if she is reaccumulating pleural fluid, may benefit from repeat thora. Will await CT imaging. Note that she also has increased risk for pneumonitis with Keytruda . If progressive groundglass infiltrates, will need to discuss with Dr. Sherrod. Rx for albuterol  nebs.   09/30/2023: SHERLEAN with Psalm Arman NP Discussed the use of AI scribe software for clinical note transcription with the patient, who gave verbal consent to proceed. Cerra Eisenhower is a 58 year old female with lung cancer who presents with worsening breathing difficulties. She has been experiencing worsening breathing difficulties over the last couple of months. CT chest showed increasing left pleural effusion. She is scheduled for a thoracentesis in two days, with her last procedure having been in October 2024. She experienced relief until late December 2024, when symptoms began to worsen again.  Since January 2025, she has experienced significant fatigue, stating that simple activities like getting out of a recliner leave her 'wore out.'  She is planned to take a break from cancer treatment as aside from the pleural effusion, her scans are stable.  Her hemoglobin levels are still low but overall improved, currently at 10 g/dL. She uses Breztri  and a rescue inhaler, and she uses a nebulizer approximately every three to four days. She experiences wheezing and a cough producing whitish phlegm. No fever or hemoptysis. Her appetite is reduced, but she describes her eating as normal for her since she was diagnosed with cancer and on treatment.  No leg swelling, orthopnea, PND.     12/19/2023: Ov with Kohan Azizi NP April Walsh is a 58 year old female with  severe COPD  and recurrent pleural effusions who presents for follow up. She feels better since her last visit, and after her thoracentesis that she had end of May 2025. She then had a paracentesis May and July, last 7/21. Both of these have helped with her breathing. She continues to experience wheezing and occasional coughing with minimal sputum production. No hemoptysis is present. Still has some chest tightness. No fevers, chills, leg swelling.  Her history of severe COPD and recurrent pleural effusions has necessitated previous thoracentesis and paracentesis procedures. A CT scan is scheduled for September. She can sense when a thoracentesis might be needed again. Feels stable right now. She is currently on a break from chemo treatment but has previously found prednisone  helpful in managing her symptoms. Her current medications include Breztri  and albuterol  for breathing difficulties.   02/13/2024: SHERLEAN with Therasa Lorenzi NP April Walsh is a 58 year old female who presents for follow up.  She has experienced increased difficulty breathing over the past few days, particularly when walking short distances, such as from the front door of the cancer center to the waiting room. This occurs every few days and is accompanied by a 'wheezy' sensation. No fever or hemoptysis is noted, and she reports a minimal cough with white or clear sputum. No chest congestion. Eating and drinking well.  She has been using Ohtuvayre  for about two weeks, twice daily, and continues to use Breztri . Last night, she used a breathing treatment followed by Ohtuvayre  and albuterol . Her husband notes that she experiences similar symptoms around this time every year, possibly due to seasonal change.   03/18/2024: Today - follow up Discussed the use of AI scribe software for clinical note transcription with the patient, who gave verbal consent to proceed.  History of Present Illness April Walsh is a 58 year old female with COPD who presents  for follow-up after a recent flare-up.  She feels significantly better since her last visit following a recent COPD flare-up. No current cough and her breathing has improved. Not noticing any wheezing. No congestion, fevers, hemoptysis, weight loss, anorexia.   She is using Breztri  twice daily and Ohtuvayre , which she finds beneficial. Her use of a rescue inhaler has decreased to once a day or every other day, which is an improvement noted by both her and her husband.  She inquires about the process for refilling her medication, indicating she has a sufficient supply for now but wants to ensure she can obtain more when needed. She mentions that her medication is delivered to her, and she is unsure of the pharmacy details but plans to check the packaging for information.  No concerns or complaints today.     Allergies  Allergen Reactions   Remeron  [Mirtazapine ] Other (See Comments)    States she slept for 5 days    Immunization History  Administered Date(s) Administered   Influenza Split 05/09/2015   PNEUMOCOCCAL CONJUGATE-20 08/08/2021    Past Medical History:  Diagnosis Date   Anemia    Asthma    Basal cell carcinoma    Bronchitis    Cervical cancer (HCC)    COPD (chronic obstructive pulmonary disease) (HCC)    early stages of COPD   Headache    HLD (hyperlipidemia)    Lung cancer (HCC) 08/13/2021    Tobacco History: Social History   Tobacco Use  Smoking Status Former   Current packs/day: 0.00   Average packs/day: 0.5 packs/day for 32.0 years (16.0 ttl pk-yrs)   Types: Cigarettes  Quit date: 08/11/2021   Years since quitting: 2.6   Passive exposure: Past  Smokeless Tobacco Never  Tobacco Comments   1/2 pack smoked daily ARJ, RN 08/02/21   Counseling given: Not Answered Tobacco comments: 1/2 pack smoked daily ARJ, RN 08/02/21   Outpatient Medications Prior to Visit  Medication Sig Dispense Refill   albuterol  (ACCUNEB ) 0.63 MG/3ML nebulizer solution Take 3 mLs  (0.63 mg total) by nebulization every 4 (four) hours as needed for wheezing or shortness of breath. 75 mL 11   albuterol  (VENTOLIN  HFA) 108 (90 Base) MCG/ACT inhaler Inhale 2 puffs into the lungs every 6 (six) hours as needed for wheezing or shortness of breath. 8 g 8   apixaban  (ELIQUIS ) 2.5 MG TABS tablet Take 1 tablet (2.5 mg total) by mouth 2 (two) times daily. 60 tablet 0   azithromycin  (ZITHROMAX ) 250 MG tablet Take 2 tablets on day one then take 1 tablet daily for four additional days 6 tablet 0   Budeson-Glycopyrrol-Formoterol  (BREZTRI  AEROSPHERE) 160-9-4.8 MCG/ACT AERO Inhale 2 puffs into the lungs in the morning and at bedtime. 10.7 each 10   Ensifentrine  (OHTUVAYRE ) 3 MG/2.5ML SUSP Inhale 2.5 mLs into the lungs 2 (two) times daily.     folic acid  (FOLVITE ) 1 MG tablet TAKE 1 TABLET BY MOUTH EVERY DAY 30 tablet 2   lidocaine -prilocaine  (EMLA ) cream APPLY TOPICALLY 1 APPLICATION AS NEEDED 30 g 2   pantoprazole  (PROTONIX ) 40 MG tablet Take 1 tablet (40 mg total) by mouth 2 (two) times daily. Twice day for 6 weeks then 40 mg daily. 180 tablet 3   predniSONE  (DELTASONE ) 10 MG tablet 4 tabs for 3 days, then 3 tabs for 3 days, 2 tabs for 3 days, then 1 tab for 3 days, then stop 30 tablet 0   prochlorperazine  (COMPAZINE ) 10 MG tablet TAKE 1 TABLET BY MOUTH EVERY 6 HOURS AS NEEDED 30 tablet 2   Facility-Administered Medications Prior to Visit  Medication Dose Route Frequency Provider Last Rate Last Admin   cyanocobalamin  (VITAMIN B12) 1000 MCG/ML injection            prochlorperazine  (COMPAZINE ) 10 MG tablet              Review of Systems: As above    Physical Exam:  BP 122/82   Pulse (!) 108   Ht 5' (1.524 m) Comment: per pt  Wt 98 lb 6.4 oz (44.6 kg)   SpO2 95%   BMI 19.22 kg/m   GEN: Pleasant, interactive, well-kempt; in no acute distress HEENT:  Normocephalic and atraumatic. PERRLA. Sclera white. Nasal turbinates pink, moist and patent bilaterally. No rhinorrhea present.  Oropharynx pink and moist, without exudate or edema. No lesions, ulcerations, or postnasal drip.  NECK:  Supple w/ fair ROM. No lymphadenopathy.   CV: RRR, no m/r/g, no peripheral edema. Pulses intact, +2 bilaterally. No cyanosis, pallor or clubbing. PULMONARY:  Unlabored, regular breathing. Clear bilaterally A&P w/o wheezes/rales/rhonchi. No accessory muscle use.  GI: BS present and normoactive. Soft, non-tender to palpation. MSK: No erythema, warmth or tenderness. Cap refil <2 sec all extrem. Neuro: A/Ox3. No focal deficits noted.   Skin: Warm, no lesions or rashe Psych: Normal affect and behavior. Judgement and thought content appropriate.     Lab Results:  CBC    Component Value Date/Time   WBC 9.1 02/06/2024 0935   WBC 9.9 12/26/2023 1535   RBC 3.95 02/06/2024 0935   HGB 11.8 (L) 02/06/2024 0935   HCT 36.2 02/06/2024 0935  PLT 271 02/06/2024 0935   MCV 91.6 02/06/2024 0935   MCH 29.9 02/06/2024 0935   MCHC 32.6 02/06/2024 0935   RDW 17.3 (H) 02/06/2024 0935   LYMPHSABS 1.1 02/06/2024 0935   MONOABS 1.3 (H) 02/06/2024 0935   EOSABS 0.2 02/06/2024 0935   BASOSABS 0.1 02/06/2024 0935    BMET    Component Value Date/Time   NA 133 (L) 02/06/2024 0935   K 3.9 02/06/2024 0935   CL 102 02/06/2024 0935   CO2 25 02/06/2024 0935   GLUCOSE 82 02/06/2024 0935   BUN 13 02/06/2024 0935   CREATININE 0.80 02/06/2024 0935   CALCIUM  8.9 02/06/2024 0935   GFRNONAA >60 02/06/2024 0935    BNP No results found for: BNP   Imaging:  No results found.   methylPREDNISolone  acetate (DEPO-MEDROL ) injection 80 mg     Date Action Dose Route User   02/13/2024 1023 Given 80 mg Intramuscular (Left Ventrogluteal) Dave Sans, Powell SAILOR, RN          Latest Ref Rng & Units 10/14/2023    8:29 AM  PFT Results  FVC-Pre L 1.28   FVC-Predicted Pre % 44   FVC-Post L 1.29   FVC-Predicted Post % 44   Pre FEV1/FVC % % 58   Post FEV1/FCV % % 56   FEV1-Pre L 0.74   FEV1-Predicted Pre  % 33   FEV1-Post L 0.73   DLCO uncorrected ml/min/mmHg 6.31   DLCO UNC% % 35   DLCO corrected ml/min/mmHg 7.13   DLCO COR %Predicted % 40   DLVA Predicted % 83   TLC L 3.43   TLC % Predicted % 77   RV % Predicted % 118     No results found for: NITRICOXIDE      Assessment & Plan:   COPD, very severe (HCC) Very severe COPD with high symptom burden that is multifactorial. Resolved exacerbation following prednisone  and empiric azithromycin . Now at baseline. Recent CXR with stable, chronic changes. Improved baseline DOE with use of Ohtuvayre . Continue aggressive maintenance regimen. Action plan in place.   Patient Instructions  Continue Breztri  2 puffs Twice daily. Brush tongue and rinse mouth afterwards Continue Albuterol  inhaler 2 puffs or 3 mL neb every 6 hours as needed for shortness of breath or wheezing. Notify if symptoms persist despite rescue inhaler/neb use.  Continue Ohtuvayre  2.5 mL neb Twice daily. Monitor for any mood changes and notify if these occur. This is an additional maintenance medication that you will use twice daily, regardless of how you feel. Not meant for emergency use   Follow up in 3-4 months with Dr. Shelah or Izetta Malachy PIETY. If symptoms do not improve or worsen, please contact office for sooner follow up or seek emergency care.    Adenocarcinoma of right lung, stage 4 (HCC) Prior concurrent chemoradiation and Keytruda . Under active surveillance. Last scan was stable aside from increased effusion. Recent CXR stable. Next CT due 04/2024. Will need to closely monitor off therapy. Follow up with oncology as scheduled.   Bilateral pleural effusion She has bilateral effusions. Appear stable on exam today. If she has worsening, may need repeat thora or consider PleurX.       Advised if symptoms do not improve or worsen, to please contact office for sooner follow up or seek emergency care.   I spent 25 minutes of dedicated to the care of this patient on  the date of this encounter to include pre-visit review of records, face-to-face time with the  patient discussing conditions above, post visit ordering of testing, clinical documentation with the electronic health record, making appropriate referrals as documented, and communicating necessary findings to members of the patients care team.  Comer LULLA Rouleau, NP 03/18/2024  Pt aware and understands NP's role.

## 2024-03-18 NOTE — Assessment & Plan Note (Addendum)
 Prior concurrent chemoradiation and Keytruda . Under active surveillance. Last scan was stable aside from increased effusion. Recent CXR stable. Next CT due 04/2024. Will need to closely monitor off therapy. Follow up with oncology as scheduled.

## 2024-03-18 NOTE — Assessment & Plan Note (Addendum)
 She has bilateral effusions. Appear stable on exam today. If she has worsening, may need repeat thora or consider PleurX.

## 2024-04-20 ENCOUNTER — Other Ambulatory Visit: Payer: Self-pay | Admitting: Internal Medicine

## 2024-04-20 DIAGNOSIS — C3401 Malignant neoplasm of right main bronchus: Secondary | ICD-10-CM

## 2024-04-21 ENCOUNTER — Ambulatory Visit: Payer: BC Managed Care – PPO | Admitting: Neurology

## 2024-04-24 ENCOUNTER — Other Ambulatory Visit: Payer: Self-pay | Admitting: Emergency Medicine

## 2024-04-24 DIAGNOSIS — C3491 Malignant neoplasm of unspecified part of right bronchus or lung: Secondary | ICD-10-CM

## 2024-05-05 ENCOUNTER — Ambulatory Visit (HOSPITAL_COMMUNITY)
Admission: RE | Admit: 2024-05-05 | Discharge: 2024-05-05 | Disposition: A | Discharge status: Home / Self Care | Source: Ambulatory Visit | Attending: Internal Medicine | Admitting: Internal Medicine

## 2024-05-05 ENCOUNTER — Encounter (HOSPITAL_COMMUNITY): Payer: Self-pay

## 2024-05-05 DIAGNOSIS — C3490 Malignant neoplasm of unspecified part of unspecified bronchus or lung: Secondary | ICD-10-CM | POA: Insufficient documentation

## 2024-05-05 MED ORDER — HEPARIN SOD (PORK) LOCK FLUSH 100 UNIT/ML IV SOLN
INTRAVENOUS | Status: AC
  Filled 2024-05-05: qty 5

## 2024-05-05 MED ORDER — IOHEXOL 300 MG/ML  SOLN
75.0000 mL | Freq: Once | INTRAMUSCULAR | Status: AC | PRN
  Administered 2024-05-05: 75 mL via INTRAVENOUS

## 2024-05-05 MED ORDER — IOHEXOL 9 MG/ML PO SOLN
1000.0000 mL | ORAL | Status: AC
  Administered 2024-05-05: 1000 mL via ORAL

## 2024-05-05 MED ORDER — HEPARIN SOD (PORK) LOCK FLUSH 100 UNIT/ML IV SOLN
500.0000 [IU] | Freq: Once | INTRAVENOUS | Status: AC
  Administered 2024-05-05: 500 [IU] via INTRAVENOUS

## 2024-05-17 ENCOUNTER — Inpatient Hospital Stay

## 2024-05-17 ENCOUNTER — Inpatient Hospital Stay: Attending: Radiation Oncology | Admitting: Internal Medicine

## 2024-05-17 VITALS — BP 119/69 | HR 107 | Temp 97.7°F | Resp 17 | Ht 60.0 in | Wt 98.4 lb

## 2024-05-17 DIAGNOSIS — C3490 Malignant neoplasm of unspecified part of unspecified bronchus or lung: Secondary | ICD-10-CM | POA: Diagnosis not present

## 2024-05-17 DIAGNOSIS — J4489 Other specified chronic obstructive pulmonary disease: Secondary | ICD-10-CM | POA: Insufficient documentation

## 2024-05-17 DIAGNOSIS — R5383 Other fatigue: Secondary | ICD-10-CM | POA: Diagnosis not present

## 2024-05-17 DIAGNOSIS — J8410 Pulmonary fibrosis, unspecified: Secondary | ICD-10-CM | POA: Diagnosis not present

## 2024-05-17 DIAGNOSIS — C7972 Secondary malignant neoplasm of left adrenal gland: Secondary | ICD-10-CM | POA: Insufficient documentation

## 2024-05-17 DIAGNOSIS — C3491 Malignant neoplasm of unspecified part of right bronchus or lung: Secondary | ICD-10-CM | POA: Diagnosis present

## 2024-05-17 DIAGNOSIS — M4854XA Collapsed vertebra, not elsewhere classified, thoracic region, initial encounter for fracture: Secondary | ICD-10-CM | POA: Insufficient documentation

## 2024-05-17 DIAGNOSIS — C7971 Secondary malignant neoplasm of right adrenal gland: Secondary | ICD-10-CM | POA: Insufficient documentation

## 2024-05-17 LAB — CMP (CANCER CENTER ONLY)
ALT: <5 U/L (ref 0–44)
AST: 17 U/L (ref 15–41)
Albumin: 3.5 g/dL (ref 3.5–5.0)
Alkaline Phosphatase: 111 U/L (ref 38–126)
Anion gap: 8 (ref 5–15)
BUN: 11 mg/dL (ref 6–20)
CO2: 24 mmol/L (ref 22–32)
Calcium: 9.2 mg/dL (ref 8.9–10.3)
Chloride: 107 mmol/L (ref 98–111)
Creatinine: 0.69 mg/dL (ref 0.44–1.00)
GFR, Estimated: >60 mL/min
Glucose, Bld: 92 mg/dL (ref 70–99)
Potassium: 3.9 mmol/L (ref 3.5–5.1)
Sodium: 139 mmol/L (ref 135–145)
Total Bilirubin: 0.2 mg/dL (ref 0.0–1.2)
Total Protein: 7 g/dL (ref 6.5–8.1)

## 2024-05-17 LAB — CBC WITH DIFFERENTIAL (CANCER CENTER ONLY)
Abs Immature Granulocytes: 0.01 K/uL (ref 0.00–0.07)
Basophils Absolute: 0.1 K/uL (ref 0.0–0.1)
Basophils Relative: 1 %
Eosinophils Absolute: 0.3 K/uL (ref 0.0–0.5)
Eosinophils Relative: 6 %
HCT: 33.7 % — ABNORMAL LOW (ref 36.0–46.0)
Hemoglobin: 11.2 g/dL — ABNORMAL LOW (ref 12.0–15.0)
Immature Granulocytes: 0 %
Lymphocytes Relative: 23 %
Lymphs Abs: 1.1 K/uL (ref 0.7–4.0)
MCH: 31 pg (ref 26.0–34.0)
MCHC: 33.2 g/dL (ref 30.0–36.0)
MCV: 93.4 fL (ref 80.0–100.0)
Monocytes Absolute: 0.7 K/uL (ref 0.1–1.0)
Monocytes Relative: 15 %
Neutro Abs: 2.6 K/uL (ref 1.7–7.7)
Neutrophils Relative %: 55 %
Platelet Count: 239 K/uL (ref 150–400)
RBC: 3.61 MIL/uL — ABNORMAL LOW (ref 3.87–5.11)
RDW: 15.4 % (ref 11.5–15.5)
WBC Count: 4.7 K/uL (ref 4.0–10.5)
nRBC: 0 % (ref 0.0–0.2)

## 2024-05-17 LAB — SAMPLE TO BLOOD BANK

## 2024-05-17 NOTE — Progress Notes (Signed)
 "     [facility] [phone]   [phone]  OFFICE PROGRESS NOTE  April Elberta Gainer, FNP [address] [place] [address]  DIAGNOSIS: Stage IV (T2b, N2, M1 C) non-small cell lung cancer, adenocarcinoma presented with right hilar mass in addition to mediastinal and upper abdominal lymphadenopathy in addition to bilateral adrenal metastases and metastatic disease in the musculature of the lower back.  This was diagnosed in April 2023.  DETECTED ALTERATION(S) / BIOMARKER(S) % CFDNA OR AMPLIFICATION ASSOCIATED FDA-APPROVED THERAPIES CLINICAL TRIAL AVAILABILITY [id number] 0.5% None  Yes [id number] (Splice Site Indel) 0.6% None  Yes  PD-L1 expression 0%  PRIOR THERAPY:  1) Palliative radiotherapy to the right hilar region under the care of Dr. Dewey. 2) systemic chemotherapy with carboplatin  for AUC of 5, Alimta  500 Mg/M2 and Keytruda  200 Mg IV every 3 weeks.  First dose August 29, 2021.  Status post 35  cycles.  Starting from cycle #5 the patient is on maintenance treatment with Alimta  and Keytruda  every 3 weeks.  Last dose was given in April 2025.  CURRENT THERAPY: Observation.  INTERVAL HISTORY: April Walsh 59 y.o. female returns to the clinic today for follow-up visit.Discussed the use of AI scribe software for clinical note transcription with the patient, who gave verbal consent to proceed.  History of Present Illness April Walsh is a 59 year old female with stage IV metastatic non-small cell lung cancer, status post chemotherapy and maintenance immunotherapy, who presents for oncology follow-up and restaging imaging.  She completed four cycles of chemotherapy every three weeks, followed by thirty-four cycles of maintenance Alimta  and Keytruda . She has been under observation since completing these treatments and is currently undergoing repeat CT imaging of the chest, abdomen, and pelvis for disease restaging.  Over the past  three to four months, she reports stable symptoms with no new or worsening complaints since her last visit. She continues to experience chronic dyspnea. She denies chest pain and cough. She has not noticed any new or enlarging masses.  Approximately six weeks ago, she sustained a fall onto her back, resulting in significant soreness but no severe pain. She has a history of vertebral compression fractures and believes the fall may have exacerbated her spinal discomfort. She did not seek medical attention and managed symptoms conservatively.  She has a port-a-cath in place from initial treatment, which is maintained and flushed regularly.     MEDICAL HISTORY: Past Medical History:  Diagnosis Date   Anemia    Asthma    Basal cell carcinoma    Bronchitis    Cervical cancer (HCC)    COPD (chronic obstructive pulmonary disease) (HCC)    early stages of COPD   Headache    HLD (hyperlipidemia)    Lung cancer (HCC) 08/13/2021    ALLERGIES:  is allergic to remeron  [mirtazapine ].  MEDICATIONS:  Current Outpatient Medications  Medication Sig Dispense Refill   albuterol  (ACCUNEB ) 0.63 MG/3ML nebulizer solution Take 3 mLs (0.63 mg total) by nebulization every 4 (four) hours as needed for wheezing or shortness of breath. 75 mL 11   albuterol  (VENTOLIN  HFA) 108 (90 Base) MCG/ACT inhaler INHALE 2 PUFFS INTO THE LUNGS EVERY 6 HOURS AS NEEDED FOR WHEEZE OR SHORTNESS OF BREATH 8.5 g 5   apixaban  (ELIQUIS ) 2.5 MG TABS tablet Take 1 tablet (2.5 mg total) by mouth 2 (two) times daily. 60 tablet 0   azithromycin  (ZITHROMAX ) 250 MG tablet Take 2 tablets on day one then take 1  tablet daily for four additional days 6 tablet 0   Budeson-Glycopyrrol-Formoterol  (BREZTRI  AEROSPHERE) 160-9-4.8 MCG/ACT AERO Inhale 2 puffs into the lungs in the morning and at bedtime. 10.7 each 10   Ensifentrine  (OHTUVAYRE ) 3 MG/2.5ML SUSP Inhale 2.5 mLs into the lungs 2 (two) times daily.     folic acid  (FOLVITE ) 1 MG tablet  TAKE 1 TABLET BY MOUTH EVERY DAY 30 tablet 2   lidocaine -prilocaine  (EMLA ) cream APPLY TOPICALLY 1 APPLICATION AS NEEDED 30 g 2   pantoprazole  (PROTONIX ) 40 MG tablet Take 1 tablet (40 mg total) by mouth 2 (two) times daily. Twice day for 6 weeks then 40 mg daily. 180 tablet 3   predniSONE  (DELTASONE ) 10 MG tablet 4 tabs for 3 days, then 3 tabs for 3 days, 2 tabs for 3 days, then 1 tab for 3 days, then stop 30 tablet 0   prochlorperazine  (COMPAZINE ) 10 MG tablet TAKE 1 TABLET BY MOUTH EVERY 6 HOURS AS NEEDED 30 tablet 2   No current facility-administered medications for this visit.   Facility-Administered Medications Ordered in Other Visits  Medication Dose Route Frequency Provider Last Rate Last Admin   cyanocobalamin  (VITAMIN B12) 1000 MCG/ML injection            prochlorperazine  (COMPAZINE ) 10 MG tablet             SURGICAL HISTORY:  Past Surgical History:  Procedure Laterality Date   APPENDECTOMY     1996   BREAST BIOPSY Right    2010-2015   BRONCHIAL BIOPSY  08/13/2021   Procedure: BRONCHIAL BIOPSIES;  Surgeon: Shelah Lamar RAMAN, MD;  Location: [facility] ENDOSCOPY;  Service: Pulmonary;;   BRONCHIAL BRUSHINGS  08/13/2021   Procedure: BRONCHIAL BRUSHINGS;  Surgeon: Shelah Lamar RAMAN, MD;  Location: [facility] ENDOSCOPY;  Service: Pulmonary;;   BRONCHIAL NEEDLE ASPIRATION BIOPSY  08/13/2021   Procedure: BRONCHIAL NEEDLE ASPIRATION BIOPSIES;  Surgeon: Shelah Lamar RAMAN, MD;  Location: [facility] ENDOSCOPY;  Service: Pulmonary;;   CESAREAN SECTION     1984, 1987   FOOT SURGERY Right    2 pins and screw 1999   FOOT SURGERY Left 06/21/2021   screw and 2 pins   HEMOSTASIS CONTROL  08/13/2021   Procedure: HEMOSTASIS CONTROL;  Surgeon: Shelah Lamar RAMAN, MD;  Location: [facility] ENDOSCOPY;  Service: Pulmonary;;   IR IMAGING GUIDED PORT INSERTION  11/26/2021   IR PARACENTESIS  12/01/2023   MELANOMA EXCISION Left    basal cell melanoma 2011   PARTIAL HYSTERECTOMY     1994   TUBAL LIGATION     1987   VIDEO BRONCHOSCOPY  08/13/2021    Procedure: VIDEO BRONCHOSCOPY WITHOUT FLUORO;  Surgeon: Shelah Lamar RAMAN, MD;  Location: [facility] ENDOSCOPY;  Service: Pulmonary;;   VIDEO BRONCHOSCOPY WITH ENDOBRONCHIAL ULTRASOUND N/A 08/13/2021   Procedure: VIDEO BRONCHOSCOPY WITH ENDOBRONCHIAL ULTRASOUND;  Surgeon: Shelah Lamar RAMAN, MD;  Location: [facility] ENDOSCOPY;  Service: Pulmonary;  Laterality: N/A;    REVIEW OF SYSTEMS:  Constitutional: positive for fatigue Eyes: negative Ears, nose, mouth, throat, and face: negative Respiratory: positive for cough and dyspnea on exertion Cardiovascular: negative Gastrointestinal: negative Genitourinary:negative Integument/breast: negative Hematologic/lymphatic: negative Musculoskeletal:negative Neurological: negative Behavioral/Psych: negative Endocrine: negative Allergic/Immunologic: negative   PHYSICAL EXAMINATION: General appearance: alert, cooperative, fatigued, and no distress Head: Normocephalic, without obvious abnormality, atraumatic Neck: no adenopathy, no JVD, supple, symmetrical, trachea midline, and thyroid  not enlarged, symmetric, no tenderness/mass/nodules Lymph nodes: Cervical, supraclavicular, and axillary nodes normal. Resp: clear to auscultation bilaterally Back: symmetric, no curvature. ROM normal. No CVA  tenderness. Cardio: regular rate and rhythm, S1, S2 normal, no murmur, click, rub or gallop GI: soft, non-tender; bowel sounds normal; no masses,  no organomegaly Extremities: extremities normal, atraumatic, no cyanosis or edema Neurologic: Alert and oriented X 3, normal strength and tone. Normal symmetric reflexes. Normal coordination and gait  ECOG PERFORMANCE STATUS: 1 - Symptomatic but completely ambulatory  Blood pressure 119/69, pulse (!) 107, temperature 97.7 F (36.5 C), temperature source Temporal, resp. rate 17, height 5' (1.524 m), weight 98 lb 6.4 oz (44.6 kg), SpO2 95%.  LABORATORY DATA: Lab Results  Component Value Date   WBC 4.7 05/17/2024   HGB 11.2 (L)  05/17/2024   HCT 33.7 (L) 05/17/2024   MCV 93.4 05/17/2024   PLT 239 05/17/2024      Chemistry      Component Value Date/Time   NA 133 (L) 02/06/2024 0935   K 3.9 02/06/2024 0935   CL 102 02/06/2024 0935   CO2 25 02/06/2024 0935   BUN 13 02/06/2024 0935   CREATININE 0.80 02/06/2024 0935      Component Value Date/Time   CALCIUM  8.9 02/06/2024 0935   ALKPHOS 112 02/06/2024 0935   AST 14 (L) 02/06/2024 0935   ALT 7 02/06/2024 0935   BILITOT 0.3 02/06/2024 0935       RADIOGRAPHIC STUDIES: CT CHEST ABDOMEN PELVIS W CONTRAST Result Date: 05/05/2024 CLINICAL DATA:  Non-small cell lung cancer (NSCLC), staging. * Tracking Code: BO * EXAM: CT CHEST, ABDOMEN, AND PELVIS WITH CONTRAST TECHNIQUE: Multidetector CT imaging of the chest, abdomen and pelvis was performed following the standard protocol during bolus administration of intravenous contrast. RADIATION DOSE REDUCTION: This exam was performed according to the departmental dose-optimization program which includes automated exposure control, adjustment of the mA and/or kV according to patient size and/or use of iterative reconstruction technique. CONTRAST:  75mL OMNIPAQUE  IOHEXOL  300 MG/ML  SOLN COMPARISON:  CT scan chest, abdomen and pelvis from 02/06/2024. FINDINGS: CT CHEST FINDINGS Cardiovascular: Normal cardiac size. No pericardial effusion. No aortic aneurysm. There are mild peripheral atherosclerotic vascular calcifications of thoracic aorta and its major branches. Mediastinum/Nodes: Visualized thyroid  gland appears grossly unremarkable. No solid / cystic mediastinal masses. The esophagus is nondistended precluding optimal assessment. There is mild circumferential thickening of the esophagus, which is most likely seen in the settings of chronic gastroesophageal reflux disease versus esophagitis. No axillary, mediastinal or hilar lymphadenopathy by size criteria. Lungs/Pleura: The central tracheo-bronchial tree is patent. Redemonstration  of irregular heterogeneous opacities centered around the right hilum with extension along the posteromedial aspect of the left upper lobe, essentially unchanged since the prior study. There also associated bronchiectatic changes. There is moderate to severe narrowing of the right upper lobe bronchus (series 6, image 52), also unchanged. There are also diffuse reticulations throughout bilateral lungs, essentially unchanged as well. There are associated bronchiectatic changes as well as patchy areas of slightly irregular interlobular and intra lobular septal thickening. There is small left pleural effusion and small-to-moderate right pleural effusion, grossly similar to the prior study. Overall findings are essentially unchanged. No new mass or consolidation. No new suspicious lung nodules. Musculoskeletal: A CT Port-a-Cath is seen in the right upper chest wall with the catheter terminating in the cavo-atrial junction region. Visualized soft tissues of the chest wall are otherwise grossly unremarkable. No suspicious osseous lesions. There are mild to moderate multilevel degenerative changes in the visualized spine. Redemonstration of mild superior endplate compression deformities of T4 and T7 vertebrae and inferior endplate deformity of T12  vertebrae, essentially similar to the prior study. No significant retropulsion or spinal canal compromise. CT ABDOMEN PELVIS FINDINGS Hepatobiliary: The liver is normal in size. Non-cirrhotic configuration. No suspicious mass. These is mild diffuse hepatic steatosis. No intrahepatic or extrahepatic bile duct dilation. No calcified gallstones. Normal gallbladder wall thickness. No pericholecystic inflammatory changes. Pancreas: Unremarkable. No pancreatic ductal dilatation or surrounding inflammatory changes. Spleen: Within normal limits. No focal lesion. Adrenals/Urinary Tract: Adrenal glands are unremarkable. No suspicious renal mass. Redemonstration of a partially exophytic  simple cyst arising from the left kidney lower pole measuring up to 2.3 x 2.7 cm. No nephroureterolithiasis or obstructive uropathy. Unremarkable urinary bladder. Stomach/Bowel: No disproportionate dilation of the small or large bowel loops. No evidence of abnormal bowel wall thickening or inflammatory changes. The appendix was not visualized; however there is no acute inflammatory process in the right lower quadrant. Vascular/Lymphatic: Resolution of previously noted ascites. No pneumoperitoneum. No abdominal or pelvic lymphadenopathy, by size criteria. No aneurysmal dilation of the major abdominal arteries. There are moderate peripheral atherosclerotic vascular calcifications of the aorta and its major branches. Reproductive: The uterus is surgically absent. No large adnexal mass. Other: There is a tiny fat containing umbilical hernia. The soft tissues and abdominal wall are otherwise unremarkable. Musculoskeletal: No suspicious osseous lesions. There are mild - moderate multilevel degenerative changes in the visualized spine. IMPRESSION: 1. Essentially stable exam. 2. Redemonstration of irregular heterogeneous opacities centered around the right hilum with extension along the posteromedial aspect of the left upper lobe, essentially similar to the prior study. There are also associated bronchiectatic changes. There is moderate to severe narrowing of the right upper lobe bronchus, also unchanged. 3. There are diffuse reticulations throughout bilateral lungs, essentially unchanged as well. There are associated bronchiectatic changes as well as patchy areas of slightly irregular interlobular and intra lobular septal thickening. There is small left pleural effusion and small-to-moderate right pleural effusion, grossly similar to the prior study. Overall findings are essentially unchanged. No new mass or consolidation. No new suspicious lung nodules. 4. No metastatic disease identified within the abdomen or pelvis. 5.  Multiple other nonacute observations, as described above. Aortic Atherosclerosis (ICD10-I70.0). Electronically Signed   By: Ree Molt M.D.   On: 05/05/2024 15:48     ASSESSMENT AND PLAN: This is a very pleasant 59 years old white female with Stage IV (T2b, N2, M1 C) non-small cell lung cancer, adenocarcinoma presented with right hilar mass in addition to mediastinal and upper abdominal lymphadenopathy in addition to bilateral adrenal metastases and metastatic disease in the musculature of the lower back.  This was diagnosed in April 2023. The molecular studies by [email or web address] showed no actionable mutations and the patient has negative PD-L1 expression. She is currently undergoing palliative radiotherapy to the right hilar region under the care of Dr. Dewey. The patient completed systemic chemotherapy started with carboplatin  for AUC of 5, Alimta  500 Mg/M2 and Keytruda  200 Mg IV every 3 weeks and starting from cycle #5 she is on maintenance treatment with Alimta  and Keytruda  every 3 weeks.  Status post a total of 35 cycles.  Last dose was given in April 2025.  She is currently on observation The patient had repeat CT scan of the chest, abdomen and pelvis performed recently.  I personally independently reviewed the scan and discussed the results with the patient today.  Her scan showed no concerning findings for disease progression. Assessment and Plan Assessment & Plan Stage IV metastatic lung cancer Stage IV metastatic lung cancer previously  treated with four cycles of a regimen followed by maintenance pemetrexed  and pembrolizumab  for thirty-four cycles. She has been under surveillance since completing therapy. Recent imaging demonstrates no evidence of disease progression or recurrence, and she remains clinically stable. - Ordered CT scan of chest, abdomen, and pelvis for disease restaging. - Scheduled follow-up in four months. - Maintained port-a-cath with flushes every two  months.  Radiation-induced pulmonary fibrosis Chronic dyspnea due to radiation-induced pulmonary fibrosis of the right lung, with stable symptoms and imaging findings since prior evaluation. - Provided reassurance regarding stable symptoms and imaging. - Recommended ongoing observation.  Compression fracture of the spine Compression fracture sustained following a fall, with persistent soreness. She was advised to call immediately if she has any other concerning symptoms in the interval.  The patient voices understanding of current disease status and treatment options and is in agreement with the current care plan. All questions were answered. The patient knows to call the clinic with any problems, questions or concerns. We can certainly see the patient much sooner if necessary. The total time spent in the appointment was 30 minutes including review of chart and various tests results, discussions about plan of care and coordination of care plan .   Disclaimer: This note was dictated with voice recognition software. Similar sounding words can inadvertently be transcribed and may not be corrected upon review.        "

## 2024-06-23 ENCOUNTER — Ambulatory Visit: Admitting: Emergency Medicine

## 2024-07-15 ENCOUNTER — Inpatient Hospital Stay: Attending: Radiation Oncology

## 2024-09-06 ENCOUNTER — Inpatient Hospital Stay: Attending: Radiation Oncology

## 2024-09-14 ENCOUNTER — Inpatient Hospital Stay

## 2024-09-14 ENCOUNTER — Inpatient Hospital Stay: Attending: Radiation Oncology | Admitting: Internal Medicine

## 2024-11-15 ENCOUNTER — Inpatient Hospital Stay: Attending: Radiation Oncology

## 2025-01-18 ENCOUNTER — Inpatient Hospital Stay: Attending: Radiation Oncology

## 2025-03-21 ENCOUNTER — Inpatient Hospital Stay: Attending: Radiation Oncology

## 2025-05-18 ENCOUNTER — Inpatient Hospital Stay: Attending: Radiation Oncology
